# Patient Record
Sex: Female | Born: 1935
Health system: Southern US, Community
[De-identification: ages and names within clinical notes are randomized; demographics above are authoritative.]

## PROBLEM LIST (undated history)

## (undated) DIAGNOSIS — D699 Hemorrhagic condition, unspecified: Secondary | ICD-10-CM

## (undated) DIAGNOSIS — M1990 Unspecified osteoarthritis, unspecified site: Secondary | ICD-10-CM

## (undated) DIAGNOSIS — E7800 Pure hypercholesterolemia, unspecified: Secondary | ICD-10-CM

## (undated) DIAGNOSIS — R233 Spontaneous ecchymoses: Secondary | ICD-10-CM

## (undated) DIAGNOSIS — I10 Essential (primary) hypertension: Secondary | ICD-10-CM

## (undated) DIAGNOSIS — R238 Other skin changes: Secondary | ICD-10-CM

## (undated) DIAGNOSIS — Z87442 Personal history of urinary calculi: Secondary | ICD-10-CM

## (undated) DIAGNOSIS — C539 Malignant neoplasm of cervix uteri, unspecified: Secondary | ICD-10-CM

## (undated) HISTORY — PX: KNEE ARTHROSCOPY: SHX127

## (undated) HISTORY — PX: CHOLECYSTECTOMY: SHX55

## (undated) HISTORY — PX: APPENDECTOMY: SHX54

## (undated) HISTORY — PX: SHOULDER OPEN ROTATOR CUFF REPAIR: SHX2407

---

## 1963-11-25 DIAGNOSIS — C539 Malignant neoplasm of cervix uteri, unspecified: Secondary | ICD-10-CM

## 1963-11-25 HISTORY — PX: ABDOMINAL HYSTERECTOMY: SHX81

## 1963-11-25 HISTORY — DX: Malignant neoplasm of cervix uteri, unspecified: C53.9

## 2000-11-20 ENCOUNTER — Encounter: Payer: Self-pay | Admitting: General Surgery

## 2000-11-25 ENCOUNTER — Ambulatory Visit (HOSPITAL_COMMUNITY): Admission: RE | Admit: 2000-11-25 | Discharge: 2000-11-26 | Payer: Self-pay | Admitting: General Surgery

## 2001-07-29 ENCOUNTER — Ambulatory Visit (HOSPITAL_COMMUNITY): Admission: RE | Admit: 2001-07-29 | Discharge: 2001-07-29 | Payer: Self-pay | Admitting: Gastroenterology

## 2006-01-14 ENCOUNTER — Ambulatory Visit: Payer: Self-pay | Admitting: Unknown Physician Specialty

## 2006-05-20 ENCOUNTER — Ambulatory Visit: Payer: Self-pay | Admitting: Unknown Physician Specialty

## 2007-11-29 ENCOUNTER — Ambulatory Visit: Payer: Self-pay | Admitting: Unknown Physician Specialty

## 2008-01-19 LAB — HM DEXA SCAN: HM Dexa Scan: NORMAL

## 2008-06-13 ENCOUNTER — Encounter: Payer: Self-pay | Admitting: Orthopedic Surgery

## 2008-06-24 ENCOUNTER — Encounter: Payer: Self-pay | Admitting: Orthopedic Surgery

## 2009-02-27 ENCOUNTER — Encounter: Payer: Self-pay | Admitting: Orthopedic Surgery

## 2009-03-28 ENCOUNTER — Inpatient Hospital Stay: Payer: Self-pay | Admitting: Internal Medicine

## 2009-04-05 ENCOUNTER — Encounter: Payer: Self-pay | Admitting: Orthopedic Surgery

## 2009-04-24 ENCOUNTER — Encounter: Payer: Self-pay | Admitting: Orthopedic Surgery

## 2009-05-24 ENCOUNTER — Encounter: Payer: Self-pay | Admitting: Orthopedic Surgery

## 2009-06-24 ENCOUNTER — Encounter: Payer: Self-pay | Admitting: Orthopedic Surgery

## 2009-07-25 ENCOUNTER — Encounter: Payer: Self-pay | Admitting: Orthopedic Surgery

## 2009-08-24 ENCOUNTER — Encounter: Payer: Self-pay | Admitting: Orthopedic Surgery

## 2010-12-15 ENCOUNTER — Encounter: Payer: Self-pay | Admitting: Rheumatology

## 2010-12-30 LAB — HM COLONOSCOPY

## 2011-01-10 LAB — HM COLONOSCOPY

## 2011-01-19 DIAGNOSIS — A0471 Enterocolitis due to Clostridium difficile, recurrent: Secondary | ICD-10-CM | POA: Insufficient documentation

## 2011-01-28 ENCOUNTER — Other Ambulatory Visit (HOSPITAL_COMMUNITY): Payer: Self-pay | Admitting: Gastroenterology

## 2011-01-28 DIAGNOSIS — R053 Chronic cough: Secondary | ICD-10-CM

## 2011-01-28 DIAGNOSIS — R05 Cough: Secondary | ICD-10-CM

## 2011-01-28 DIAGNOSIS — K5792 Diverticulitis of intestine, part unspecified, without perforation or abscess without bleeding: Secondary | ICD-10-CM

## 2011-01-29 ENCOUNTER — Ambulatory Visit (HOSPITAL_COMMUNITY)
Admission: RE | Admit: 2011-01-29 | Discharge: 2011-01-29 | Disposition: A | Discharge status: Home / Self Care | Payer: Medicare Other | Source: Ambulatory Visit | Attending: Gastroenterology | Admitting: Gastroenterology

## 2011-01-29 ENCOUNTER — Encounter (HOSPITAL_COMMUNITY): Payer: Self-pay

## 2011-01-29 DIAGNOSIS — K5792 Diverticulitis of intestine, part unspecified, without perforation or abscess without bleeding: Secondary | ICD-10-CM

## 2011-01-29 DIAGNOSIS — R05 Cough: Secondary | ICD-10-CM

## 2011-01-29 DIAGNOSIS — R109 Unspecified abdominal pain: Secondary | ICD-10-CM | POA: Insufficient documentation

## 2011-01-29 DIAGNOSIS — K5730 Diverticulosis of large intestine without perforation or abscess without bleeding: Secondary | ICD-10-CM | POA: Insufficient documentation

## 2011-01-29 DIAGNOSIS — R053 Chronic cough: Secondary | ICD-10-CM

## 2011-01-29 DIAGNOSIS — R197 Diarrhea, unspecified: Secondary | ICD-10-CM | POA: Insufficient documentation

## 2011-01-29 HISTORY — DX: Essential (primary) hypertension: I10

## 2011-01-29 MED ORDER — IOHEXOL 300 MG/ML  SOLN
100.0000 mL | Freq: Once | INTRAMUSCULAR | Status: AC | PRN
  Administered 2011-01-29: 100 mL via INTRAVENOUS

## 2011-01-30 LAB — HM MAMMOGRAPHY: HM Mammogram: NORMAL

## 2011-02-09 ENCOUNTER — Emergency Department (HOSPITAL_COMMUNITY)
Admission: EM | Admit: 2011-02-09 | Discharge: 2011-02-10 | Disposition: A | Discharge status: Home / Self Care | Payer: Medicare Other | Attending: Emergency Medicine | Admitting: Emergency Medicine

## 2011-02-09 DIAGNOSIS — R197 Diarrhea, unspecified: Secondary | ICD-10-CM | POA: Insufficient documentation

## 2011-02-09 DIAGNOSIS — R112 Nausea with vomiting, unspecified: Secondary | ICD-10-CM | POA: Insufficient documentation

## 2011-02-09 DIAGNOSIS — R109 Unspecified abdominal pain: Secondary | ICD-10-CM | POA: Insufficient documentation

## 2011-02-09 DIAGNOSIS — E7800 Pure hypercholesterolemia, unspecified: Secondary | ICD-10-CM | POA: Insufficient documentation

## 2011-02-09 LAB — CBC
HCT: 43.1 % (ref 36.0–46.0)
Hemoglobin: 14.6 g/dL (ref 12.0–15.0)
MCH: 31.1 pg (ref 26.0–34.0)
MCHC: 33.9 g/dL (ref 30.0–36.0)
MCV: 91.7 fL (ref 78.0–100.0)
Platelets: 196 10*3/uL (ref 150–400)
RBC: 4.7 MIL/uL (ref 3.87–5.11)
RDW: 13.8 % (ref 11.5–15.5)
WBC: 11.3 10*3/uL — ABNORMAL HIGH (ref 4.0–10.5)

## 2011-02-09 LAB — POCT CARDIAC MARKERS
CKMB, poc: <1 ng/mL — ABNORMAL LOW (ref 1.0–8.0)
Myoglobin, poc: 125 ng/mL (ref 12–200)
Troponin i, poc: <0.05 ng/mL (ref 0.00–0.09)

## 2011-02-09 LAB — DIFFERENTIAL
Basophils Absolute: 0 10*3/uL (ref 0.0–0.1)
Basophils Relative: 0 % (ref 0–1)
Eosinophils Absolute: 0 10*3/uL (ref 0.0–0.7)
Eosinophils Relative: 0 % (ref 0–5)
Lymphocytes Relative: 8 % — ABNORMAL LOW (ref 12–46)
Lymphs Abs: 0.9 10*3/uL (ref 0.7–4.0)
Monocytes Absolute: 0.6 10*3/uL (ref 0.1–1.0)
Monocytes Relative: 5 % (ref 3–12)
Neutro Abs: 9.8 10*3/uL — ABNORMAL HIGH (ref 1.7–7.7)
Neutrophils Relative %: 87 % — ABNORMAL HIGH (ref 43–77)

## 2011-02-09 LAB — COMPREHENSIVE METABOLIC PANEL
ALT: 24 U/L (ref 0–35)
AST: 24 U/L (ref 0–37)
Albumin: 3.2 g/dL — ABNORMAL LOW (ref 3.5–5.2)
Alkaline Phosphatase: 47 U/L (ref 39–117)
BUN: 11 mg/dL (ref 6–23)
CO2: 24 mEq/L (ref 19–32)
Calcium: 8.6 mg/dL (ref 8.4–10.5)
Chloride: 101 mEq/L (ref 96–112)
Creatinine, Ser: 0.93 mg/dL (ref 0.4–1.2)
GFR calc Af Amer: >60 mL/min (ref >60)
GFR calc non Af Amer: 58 mL/min — ABNORMAL LOW (ref >60)
Glucose, Bld: 124 mg/dL — ABNORMAL HIGH (ref 70–99)
Potassium: 3.2 mEq/L — ABNORMAL LOW (ref 3.5–5.1)
Sodium: 133 mEq/L — ABNORMAL LOW (ref 135–145)
Total Bilirubin: 0.6 mg/dL (ref 0.3–1.2)
Total Protein: 6.5 g/dL (ref 6.0–8.3)

## 2011-02-09 LAB — LIPASE, BLOOD: Lipase: 18 U/L (ref 11–59)

## 2011-02-09 LAB — URINALYSIS, ROUTINE W REFLEX MICROSCOPIC
Bilirubin Urine: NEGATIVE
Glucose, UA: NEGATIVE mg/dL
Leukocytes, UA: NEGATIVE
Nitrite: NEGATIVE
Protein, ur: 30 mg/dL — AB
Specific Gravity, Urine: 1.024 (ref 1.005–1.030)
Urobilinogen, UA: 0.2 mg/dL (ref 0.0–1.0)
pH: 5 (ref 5.0–8.0)

## 2011-02-09 LAB — URINE MICROSCOPIC-ADD ON

## 2011-02-09 LAB — LACTIC ACID, PLASMA: Lactic Acid, Venous: 1.8 mmol/L (ref 0.5–2.2)

## 2011-02-10 LAB — OCCULT BLOOD, POC DEVICE: Fecal Occult Bld: POSITIVE

## 2011-02-11 LAB — URINE CULTURE
Colony Count: NO GROWTH
Culture  Setup Time: 201203190951
Culture: NO GROWTH

## 2011-02-13 ENCOUNTER — Emergency Department (HOSPITAL_COMMUNITY)
Admission: EM | Admit: 2011-02-13 | Discharge: 2011-02-13 | Disposition: A | Discharge status: Home / Self Care | Payer: Medicare Other | Attending: Emergency Medicine | Admitting: Emergency Medicine

## 2011-02-13 DIAGNOSIS — R109 Unspecified abdominal pain: Secondary | ICD-10-CM | POA: Insufficient documentation

## 2011-02-13 DIAGNOSIS — Z8541 Personal history of malignant neoplasm of cervix uteri: Secondary | ICD-10-CM | POA: Insufficient documentation

## 2011-02-13 DIAGNOSIS — R634 Abnormal weight loss: Secondary | ICD-10-CM | POA: Insufficient documentation

## 2011-02-13 DIAGNOSIS — R197 Diarrhea, unspecified: Secondary | ICD-10-CM | POA: Insufficient documentation

## 2011-02-13 DIAGNOSIS — Z79899 Other long term (current) drug therapy: Secondary | ICD-10-CM | POA: Insufficient documentation

## 2011-02-13 DIAGNOSIS — E876 Hypokalemia: Secondary | ICD-10-CM | POA: Insufficient documentation

## 2011-02-13 DIAGNOSIS — R5381 Other malaise: Secondary | ICD-10-CM | POA: Insufficient documentation

## 2011-02-13 DIAGNOSIS — R5383 Other fatigue: Secondary | ICD-10-CM | POA: Insufficient documentation

## 2011-02-13 DIAGNOSIS — G8929 Other chronic pain: Secondary | ICD-10-CM | POA: Insufficient documentation

## 2011-02-13 LAB — URINALYSIS, ROUTINE W REFLEX MICROSCOPIC
Bilirubin Urine: NEGATIVE
Glucose, UA: NEGATIVE mg/dL
Hgb urine dipstick: NEGATIVE
Ketones, ur: NEGATIVE mg/dL
Nitrite: NEGATIVE
Protein, ur: NEGATIVE mg/dL
Specific Gravity, Urine: 1.01 (ref 1.005–1.030)
Urobilinogen, UA: 0.2 mg/dL (ref 0.0–1.0)
pH: 6 (ref 5.0–8.0)

## 2011-02-13 LAB — COMPREHENSIVE METABOLIC PANEL
ALT: 18 U/L (ref 0–35)
AST: 20 U/L (ref 0–37)
Albumin: 3.3 g/dL — ABNORMAL LOW (ref 3.5–5.2)
Alkaline Phosphatase: 43 U/L (ref 39–117)
BUN: 4 mg/dL — ABNORMAL LOW (ref 6–23)
CO2: 27 mEq/L (ref 19–32)
Calcium: 8.6 mg/dL (ref 8.4–10.5)
Chloride: 105 mEq/L (ref 96–112)
Creatinine, Ser: 0.83 mg/dL (ref 0.4–1.2)
GFR calc Af Amer: >60 mL/min (ref >60)
GFR calc non Af Amer: >60 mL/min (ref >60)
Glucose, Bld: 104 mg/dL — ABNORMAL HIGH (ref 70–99)
Potassium: 3.1 mEq/L — ABNORMAL LOW (ref 3.5–5.1)
Sodium: 138 mEq/L (ref 135–145)
Total Bilirubin: 0.4 mg/dL (ref 0.3–1.2)
Total Protein: 6.1 g/dL (ref 6.0–8.3)

## 2011-02-13 LAB — URINE MICROSCOPIC-ADD ON

## 2011-02-13 LAB — DIFFERENTIAL
Basophils Absolute: 0.1 10*3/uL (ref 0.0–0.1)
Basophils Relative: 2 % — ABNORMAL HIGH (ref 0–1)
Eosinophils Absolute: 0.1 10*3/uL (ref 0.0–0.7)
Eosinophils Relative: 2 % (ref 0–5)
Lymphocytes Relative: 57 % — ABNORMAL HIGH (ref 12–46)
Lymphs Abs: 3.3 10*3/uL (ref 0.7–4.0)
Monocytes Absolute: 0.7 10*3/uL (ref 0.1–1.0)
Monocytes Relative: 11 % (ref 3–12)
Neutro Abs: 1.7 10*3/uL (ref 1.7–7.7)
Neutrophils Relative %: 28 % — ABNORMAL LOW (ref 43–77)

## 2011-02-13 LAB — CBC
HCT: 38.8 % (ref 36.0–46.0)
Hemoglobin: 13.5 g/dL (ref 12.0–15.0)
MCH: 31 pg (ref 26.0–34.0)
MCHC: 34.8 g/dL (ref 30.0–36.0)
MCV: 89 fL (ref 78.0–100.0)
Platelets: 233 10*3/uL (ref 150–400)
RBC: 4.36 MIL/uL (ref 3.87–5.11)
RDW: 13.9 % (ref 11.5–15.5)
WBC: 5.9 10*3/uL (ref 4.0–10.5)

## 2011-02-13 LAB — LIPASE, BLOOD: Lipase: 23 U/L (ref 11–59)

## 2011-07-10 ENCOUNTER — Encounter: Payer: Self-pay | Admitting: Orthopedic Surgery

## 2011-07-26 ENCOUNTER — Encounter: Payer: Self-pay | Admitting: Orthopedic Surgery

## 2011-08-25 ENCOUNTER — Encounter: Payer: Self-pay | Admitting: Orthopedic Surgery

## 2011-11-25 DIAGNOSIS — M2560 Stiffness of unspecified joint, not elsewhere classified: Secondary | ICD-10-CM | POA: Diagnosis not present

## 2011-11-25 DIAGNOSIS — M549 Dorsalgia, unspecified: Secondary | ICD-10-CM | POA: Diagnosis not present

## 2011-11-25 DIAGNOSIS — IMO0001 Reserved for inherently not codable concepts without codable children: Secondary | ICD-10-CM | POA: Diagnosis not present

## 2011-11-25 DIAGNOSIS — M6281 Muscle weakness (generalized): Secondary | ICD-10-CM | POA: Diagnosis not present

## 2011-11-27 DIAGNOSIS — IMO0001 Reserved for inherently not codable concepts without codable children: Secondary | ICD-10-CM | POA: Diagnosis not present

## 2011-11-27 DIAGNOSIS — M6281 Muscle weakness (generalized): Secondary | ICD-10-CM | POA: Diagnosis not present

## 2011-11-27 DIAGNOSIS — M2560 Stiffness of unspecified joint, not elsewhere classified: Secondary | ICD-10-CM | POA: Diagnosis not present

## 2011-11-27 DIAGNOSIS — M549 Dorsalgia, unspecified: Secondary | ICD-10-CM | POA: Diagnosis not present

## 2011-12-05 DIAGNOSIS — M6281 Muscle weakness (generalized): Secondary | ICD-10-CM | POA: Diagnosis not present

## 2011-12-05 DIAGNOSIS — IMO0001 Reserved for inherently not codable concepts without codable children: Secondary | ICD-10-CM | POA: Diagnosis not present

## 2011-12-05 DIAGNOSIS — M2560 Stiffness of unspecified joint, not elsewhere classified: Secondary | ICD-10-CM | POA: Diagnosis not present

## 2011-12-05 DIAGNOSIS — M549 Dorsalgia, unspecified: Secondary | ICD-10-CM | POA: Diagnosis not present

## 2011-12-17 DIAGNOSIS — E8941 Symptomatic postprocedural ovarian failure: Secondary | ICD-10-CM | POA: Diagnosis not present

## 2012-01-30 ENCOUNTER — Encounter: Payer: Self-pay | Admitting: Internal Medicine

## 2012-01-30 ENCOUNTER — Ambulatory Visit (INDEPENDENT_AMBULATORY_CARE_PROVIDER_SITE_OTHER): Payer: Medicare Other | Admitting: Internal Medicine

## 2012-01-30 VITALS — BP 116/64 | HR 78 | Temp 97.8°F | Resp 14 | Ht 62.0 in | Wt 128.8 lb

## 2012-01-30 DIAGNOSIS — Z1239 Encounter for other screening for malignant neoplasm of breast: Secondary | ICD-10-CM

## 2012-01-30 DIAGNOSIS — M545 Low back pain, unspecified: Secondary | ICD-10-CM | POA: Diagnosis not present

## 2012-01-30 DIAGNOSIS — E785 Hyperlipidemia, unspecified: Secondary | ICD-10-CM | POA: Diagnosis not present

## 2012-01-30 DIAGNOSIS — M5430 Sciatica, unspecified side: Secondary | ICD-10-CM

## 2012-01-30 DIAGNOSIS — D126 Benign neoplasm of colon, unspecified: Secondary | ICD-10-CM | POA: Diagnosis not present

## 2012-01-30 DIAGNOSIS — K635 Polyp of colon: Secondary | ICD-10-CM | POA: Insufficient documentation

## 2012-01-30 DIAGNOSIS — Z87898 Personal history of other specified conditions: Secondary | ICD-10-CM | POA: Insufficient documentation

## 2012-01-30 DIAGNOSIS — M5431 Sciatica, right side: Secondary | ICD-10-CM | POA: Insufficient documentation

## 2012-01-30 NOTE — Patient Instructions (Signed)
Return in April for a lab draws for fasting lipids and liver function tests

## 2012-01-30 NOTE — Progress Notes (Signed)
Subjective:    Patient ID: Emily Wu, female    DOB: September 04, 1931, 78 y.o.   MRN: [id number]  HPI Emily Wu is a 78 yr old white female who is here to establish primary care as a former patient of Emily Wu  She was previously Emily Wu's patient for a year or two.  She has no major complaints today but does not some persistent daily anxiety that   "I am very happy but I tend to be hyperactive".  She denies any history of trauma or physical abuse  that might predsipoe her to panic attacks and denies any features of panic disorder.  No prior trial of SSRI therapy.  Past Medical History  Diagnosis Date  . Hypertension   . S/P appendectomy   . S/P cholecystectomy   . S/P hysterectomy     Current Outpatient Prescriptions  Medication Sig Dispense Refill  . Ascorbic Acid (VITAMIN C) 100 MG tablet Take 100 mg by mouth daily.      Marland Kitchen atorvastatin (LIPITOR) 20 MG tablet Take 20 mg by mouth daily.      . calcium carbonate (OS-CAL) 600 MG TABS Take 600 mg by mouth 2 (two) times daily with a meal.      . estrogens, conjugated, (PREMARIN) 0.3 MG tablet Take 0.3 mg by mouth daily. Take daily for 21 days then do not take for 7 days.      . Multiple Vitamin (MULTIVITAMIN) tablet Take 1 tablet by mouth daily.      . Probiotic Product (PROBIOTIC FORMULA PO) Take by mouth.        Review of Systems  Constitutional: Negative for fever, chills and unexpected weight change.  HENT: Negative for hearing loss, ear pain, nosebleeds, congestion, sore throat, facial swelling, rhinorrhea, sneezing, mouth sores, trouble swallowing, neck pain, neck stiffness, voice change, postnasal drip, sinus pressure, tinnitus and ear discharge.   Eyes: Negative for pain, discharge, redness and visual disturbance.  Respiratory: Negative for cough, chest tightness, shortness of breath, wheezing and stridor.   Cardiovascular: Negative for chest pain, palpitations and leg swelling.  Musculoskeletal: Negative for myalgias  and arthralgias.  Skin: Negative for color change and rash.  Neurological: Negative for dizziness, weakness, light-headedness and headaches.  Hematological: Negative for adenopathy.       Objective:   Physical Exam  Constitutional: She is oriented to person, place, and time. She appears well-developed and well-nourished.  HENT:  Mouth/Throat: Oropharynx is clear and moist.  Eyes: EOM are normal. Pupils are equal, round, and reactive to light. No scleral icterus.  Neck: Normal range of motion. Neck supple. No JVD present. No thyromegaly present.  Cardiovascular: Normal rate, regular rhythm, normal heart sounds and intact distal pulses.   Pulmonary/Chest: Effort normal and breath sounds normal.  Abdominal: Soft. Bowel sounds are normal. She exhibits no mass. There is no tenderness.  Musculoskeletal: Normal range of motion. She exhibits no edema.  Lymphadenopathy:    She has no cervical adenopathy.  Neurological: She is alert and oriented to person, place, and time.  Skin: Skin is warm and dry.  Psychiatric: Her speech is normal. Judgment and thought content normal. Her mood appears anxious. She is is hyperactive. Cognition and memory are normal.       Very fidgety,  Repeatedly adjusts the waistband and zipper on her jacket       Assessment & Plan:   Sciatica of right side Occurred May 2012,  Symptoms resolved after PT, but she has occasional flares.  Recommended daily exercise to build core muscle strength with examples given     Colon polyps Managed with serial colonoscopy by Emily Wu in [place].  She is up to date.   Back pain, lumbosacral Chronic, aggravated by prolonged sitting and walking . Does daily exercises,  Monthly massage. Prior PT last May helped ,  Saw a neurosurgeon at [facility] who suggested repeating PT.  Avoiding NSAIDs due to history of gastric ulcer caused by Vioxx.     Updated Medication List Outpatient Encounter Prescriptions as of 01/30/2012  Medication Sig Dispense  Refill  . Ascorbic Acid (VITAMIN C) 100 MG tablet Take 100 mg by mouth daily.      Marland Kitchen atorvastatin (LIPITOR) 20 MG tablet Take 20 mg by mouth daily.      . calcium carbonate (OS-CAL) 600 MG TABS Take 600 mg by mouth 2 (two) times daily with a meal.      . estrogens, conjugated, (PREMARIN) 0.3 MG tablet Take 0.3 mg by mouth daily. Take daily for 21 days then do not take for 7 days.      . Multiple Vitamin (MULTIVITAMIN) tablet Take 1 tablet by mouth daily.      . Probiotic Product (PROBIOTIC FORMULA PO) Take by mouth.

## 2012-02-01 ENCOUNTER — Encounter: Payer: Self-pay | Admitting: Internal Medicine

## 2012-02-01 DIAGNOSIS — E785 Hyperlipidemia, unspecified: Secondary | ICD-10-CM | POA: Insufficient documentation

## 2012-02-01 NOTE — Assessment & Plan Note (Signed)
Occurred May 2012,  Symptoms resolved after PT, but she has occasional flares.  Recommended daily exercise to build core muscle strength with examples given

## 2012-02-01 NOTE — Assessment & Plan Note (Signed)
Chronic, aggravated by prolonged sitting and walking . Does daily exercises,  Monthly massage. Prior PT last May helped ,  Saw a neurosurgeon at [facility] who suggested repeating PT.  Avoiding NSAIDs due to history of gastric ulcer caused by Vioxx.

## 2012-02-01 NOTE — Assessment & Plan Note (Signed)
Managed with serial colonoscopy by [facility] in [place].  She is up to date.

## 2012-02-01 NOTE — Assessment & Plan Note (Signed)
Managed with statin therapy which she has tolerated without incident.  Repeat CMET and lipids due in April.

## 2012-03-09 ENCOUNTER — Other Ambulatory Visit (INDEPENDENT_AMBULATORY_CARE_PROVIDER_SITE_OTHER): Payer: Medicare Other | Admitting: *Deleted

## 2012-03-09 DIAGNOSIS — E785 Hyperlipidemia, unspecified: Secondary | ICD-10-CM

## 2012-03-09 LAB — LIPID PANEL
Cholesterol: 163 mg/dL (ref 0–200)
HDL: 42.7 mg/dL (ref >39.00)
LDL Cholesterol: 81 mg/dL (ref 0–99)
Total CHOL/HDL Ratio: 4
Triglycerides: 197 mg/dL — ABNORMAL HIGH (ref 0.0–149.0)
VLDL: 39.4 mg/dL (ref 0.0–40.0)

## 2012-03-10 LAB — COMPLETE METABOLIC PANEL WITH GFR
ALT: 17 U/L (ref 0–35)
AST: 23 U/L (ref 0–37)
Albumin: 3.8 g/dL (ref 3.5–5.2)
Alkaline Phosphatase: 49 U/L (ref 39–117)
BUN: 15 mg/dL (ref 6–23)
CO2: 30 mEq/L (ref 19–32)
Calcium: 9.3 mg/dL (ref 8.4–10.5)
Chloride: 107 mEq/L (ref 96–112)
Creat: 0.92 mg/dL (ref 0.50–1.10)
GFR, Est African American: 69 mL/min
GFR, Est Non African American: 60 mL/min
Glucose, Bld: 80 mg/dL (ref 70–99)
Potassium: 4.3 mEq/L (ref 3.5–5.3)
Sodium: 141 mEq/L (ref 135–145)
Total Bilirubin: 0.3 mg/dL (ref 0.3–1.2)
Total Protein: 6 g/dL (ref 6.0–8.3)

## 2012-03-16 DIAGNOSIS — Z978 Presence of other specified devices: Secondary | ICD-10-CM | POA: Diagnosis not present

## 2012-03-16 DIAGNOSIS — Z9189 Other specified personal risk factors, not elsewhere classified: Secondary | ICD-10-CM | POA: Diagnosis not present

## 2012-03-23 DIAGNOSIS — H269 Unspecified cataract: Secondary | ICD-10-CM | POA: Diagnosis not present

## 2012-04-12 ENCOUNTER — Ambulatory Visit (INDEPENDENT_AMBULATORY_CARE_PROVIDER_SITE_OTHER): Payer: Medicare Other | Admitting: Internal Medicine

## 2012-04-12 ENCOUNTER — Encounter: Payer: Self-pay | Admitting: Internal Medicine

## 2012-04-12 VITALS — BP 144/74 | HR 77 | Temp 98.3°F | Resp 16 | Ht 62.0 in | Wt 128.0 lb

## 2012-04-12 DIAGNOSIS — J029 Acute pharyngitis, unspecified: Secondary | ICD-10-CM

## 2012-04-12 DIAGNOSIS — J069 Acute upper respiratory infection, unspecified: Secondary | ICD-10-CM | POA: Insufficient documentation

## 2012-04-12 NOTE — Assessment & Plan Note (Signed)
Persistent symptoms with normal exam suggest reflux ans cause.  3 week trial of dexilant 60 mg and salt water gargles.

## 2012-04-12 NOTE — Assessment & Plan Note (Signed)
.  This is most likely viral given the symptoms she has prescribed. .  Discussed with patient that an antibiotic will not help the symptoms and will increase  his risk of developing diarrhea. . Benadryl or zyrtec for PND, oral decongestant, Delsym for cough, Gargle with salt water.

## 2012-04-12 NOTE — Progress Notes (Signed)
Patient ID: Emily Wu, female   DOB: December 06, 1930, 78 y.o.   MRN: [id number] Patient Active Problem List  Diagnoses  . H/O diarrhea  . Colon polyps  . Sciatica of right side  . Back pain, lumbosacral  . Hyperlipidemia LDL goal < 100  . Pharyngitis  . Acute URI of multiple sites    Subjective:  CC:   Chief Complaint  Patient presents with  . Nasal Congestion  . Sore Throat  . Cough    productive cough with yellow mucus    HPI:   HANNAHGRACE Wu a 78 y.o. female who presents with epigastric burning since Tuesday accompanied by sinus drainage, sore throat and cough productive of yellow sputum.  She has been taking chloraseptic and nyquil.  Her Cough has improved but is  not resolved.      Past Medical History  Diagnosis Date  . Hypertension   . S/P appendectomy   . S/P cholecystectomy   . S/P hysterectomy     Past Surgical History  Procedure Date  . Left knee     arthroscopy ,  [facility]  . Rotator cuff repair 2007,  2010    bilateral,  [facility]         The following portions of the patient's history were reviewed and updated as appropriate: Allergies, current medications, and problem list.    Review of Systems:   12 Pt  review of systems was negative except those addressed in the HPI,     History   Social History  . Marital Status: Married    Spouse Name: N/A    Number of Children: N/A  . Years of Education: N/A   Occupational History  . Not on file.   Social History Main Topics  . Smoking status: Former Games developer  . Smokeless tobacco: Never Used  . Alcohol Use: No  . Drug Use: No  . Sexually Active: Not on file   Other Topics Concern  . Not on file   Social History Narrative  . No narrative on file    Objective:  BP 144/74  Pulse 77  Temp(Src) 98.3 F (36.8 C) (Oral)  Resp 16  Ht 5\' 2"  (1.575 m)  Wt 128 lb (58.06 kg)  BMI 23.41 kg/m2  SpO2 99%  General appearance: alert, cooperative and appears stated age Ears: normal TM's  and external ear canals both ears Throat: lips, mucosa, and tongue normal; teeth and gums normal Neck: no adenopathy, no carotid bruit, supple, symmetrical, trachea midline and thyroid not enlarged, symmetric, no tenderness/mass/nodules Back: symmetric, no curvature. ROM normal. No CVA tenderness. Lungs: clear to auscultation bilaterally Heart: regular rate and rhythm, S1, S2 normal, no murmur, click, rub or gallop Abdomen: soft, non-tender; bowel sounds normal; no masses,  no organomegaly Pulses: 2+ and symmetric Skin: Skin color, texture, turgor normal. No rashes or lesions Lymph nodes: Cervical, supraclavicular, and axillary nodes normal.  Assessment and Plan:  Pharyngitis Persistent symptoms with normal exam suggest reflux ans cause.  3 week trial of dexilant 60 mg and salt water gargles.   Acute URI of multiple sites .This is most likely viral given the symptoms she has prescribed. .  Discussed with patient that an antibiotic will not help the symptoms and will increase  his risk of developing diarrhea. . Benadryl or zyrtec for PND, oral decongestant, Delsym for cough, Gargle with salt water.     Updated Medication List Outpatient Encounter Prescriptions as of 04/12/2012  Medication Sig Dispense Refill  .  Ascorbic Acid (VITAMIN C) 100 MG tablet Take 100 mg by mouth daily.      Marland Kitchen atorvastatin (LIPITOR) 20 MG tablet Take 20 mg by mouth daily.      . calcium carbonate (OS-CAL) 600 MG TABS Take 600 mg by mouth 2 (two) times daily with a meal.      . estrogens, conjugated, (PREMARIN) 0.3 MG tablet Take 0.3 mg by mouth daily. Take daily for 21 days then do not take for 7 days.      . Multiple Vitamin (MULTIVITAMIN) tablet Take 1 tablet by mouth daily.      . Probiotic Product (PROBIOTIC FORMULA PO) Take by mouth.         No orders of the defined types were placed in this encounter.    No Follow-up on file.

## 2012-04-12 NOTE — Patient Instructions (Signed)
I believe you have a viral  Syndrome .  The post nasal drip is causing your sore throat.   Lavage your sinuses twice daly with Simply saline nasal spray along with either benadryl 25 mg every 8 hours  Or your daily zyrtec for the post nasal drip  Gargle with salt water often for the sore throat.  Use Delsym OTC for the cough.   I am also giving you samples of a medicine for reflux,  Take it daily .  If the throat is no better  In 3 to 4 days OR  if you develop T > 100.4,  Green nasal discharge,  Or facial pain,  Call me back and I will send an antibiotic.

## 2012-07-01 ENCOUNTER — Telehealth: Payer: Self-pay | Admitting: Internal Medicine

## 2012-07-01 NOTE — Telephone Encounter (Signed)
Patient calling, woke up with a sore throat on Wednesday.  Same has continued and now has a cough with yellow mucous.  Afebrile.   Has a headache also.   Triaged per Cough.  Needs to be seen in 24 hours.  Scheduled for Friday at 415p.

## 2012-07-02 ENCOUNTER — Encounter: Payer: Self-pay | Admitting: Internal Medicine

## 2012-07-02 ENCOUNTER — Ambulatory Visit (INDEPENDENT_AMBULATORY_CARE_PROVIDER_SITE_OTHER): Payer: Medicare Other | Admitting: Internal Medicine

## 2012-07-02 VITALS — BP 120/64 | HR 76 | Temp 97.7°F | Resp 16 | Wt 129.5 lb

## 2012-07-02 DIAGNOSIS — J069 Acute upper respiratory infection, unspecified: Secondary | ICD-10-CM | POA: Diagnosis not present

## 2012-07-02 MED ORDER — BENZONATATE 100 MG PO CAPS: 100.0000 mg | ORAL_CAPSULE | Freq: Three times a day (TID) | ORAL | Status: DC | PRN

## 2012-07-02 MED ORDER — AZITHROMYCIN 250 MG PO TABS: ORAL_TABLET | ORAL | Status: AC

## 2012-07-02 NOTE — Progress Notes (Signed)
Patient ID: Emily Wu, female   DOB: 1931/06/29, 78 y.o.   MRN: [id number] Subjective:     Emily Wu is a 78 y.o. female who presents for evaluation of symptoms of a URI. Symptoms include congestion, cough described as nonproductive, headache described as mild and post nasal drip. Onset of symptoms was 2 days ago, and has been unchanged since that time. Treatment to date: none.  The following portions of the patient's history were reviewed and updated as appropriate: allergies, current medications, past family history, past medical history, past social history, past surgical history and problem list.  Review of Systems Ears, nose, mouth, throat, and face: positive for nasal congestion and sore throat   Objective:    General appearance: alert, cooperative and appears stated age Eyes: conjunctivae/corneas clear. PERRL, EOM's intact. Fundi benign. Ears: normal TM's and external ear canals both ears Nose: Nares normal. Septum midline. Mucosa normal. No drainage or sinus tenderness. Throat: lips, mucosa, and tongue normal; teeth and gums normal Lungs: clear to auscultation bilaterally   Assessment:    viral upper respiratory illness   Plan:    Discussed diagnosis and treatment of URI. Suggested symptomatic OTC remedies. Nasal saline spray for congestion. Follow up as needed.

## 2012-07-02 NOTE — Patient Instructions (Addendum)
You have a viral  Syndrome .  The post nasal drip is causing your sore throat.  Lavage your sinuses twice daly with Simply saline nasal spray.  Use benadryl 25 mg every 8 hours and Sudafed PE 10 to 30 every 8 hours to manage the drainage and congestion.  Gargle with salt water often for the sore throat.  If OTC Delsym does not manage the cough, you can use the cough capsules I have prescribed.  If the throat is no better  In 3 to 4 days OR  if you develop T > 100.4,  Green nasal discharge,  Or facial pain,  Start the z pack.

## 2012-07-06 ENCOUNTER — Encounter: Payer: Self-pay | Admitting: Internal Medicine

## 2012-07-06 ENCOUNTER — Ambulatory Visit (INDEPENDENT_AMBULATORY_CARE_PROVIDER_SITE_OTHER): Payer: Medicare Other | Admitting: Internal Medicine

## 2012-07-06 ENCOUNTER — Ambulatory Visit (INDEPENDENT_AMBULATORY_CARE_PROVIDER_SITE_OTHER)
Admission: RE | Admit: 2012-07-06 | Discharge: 2012-07-06 | Disposition: A | Discharge status: Home / Self Care | Payer: Medicare Other | Source: Ambulatory Visit | Attending: Internal Medicine | Admitting: Internal Medicine

## 2012-07-06 VITALS — BP 126/62 | HR 80 | Temp 97.9°F | Resp 16 | Wt 128.8 lb

## 2012-07-06 DIAGNOSIS — M1712 Unilateral primary osteoarthritis, left knee: Secondary | ICD-10-CM | POA: Insufficient documentation

## 2012-07-06 DIAGNOSIS — N951 Menopausal and female climacteric states: Secondary | ICD-10-CM

## 2012-07-06 DIAGNOSIS — M1990 Unspecified osteoarthritis, unspecified site: Secondary | ICD-10-CM | POA: Diagnosis not present

## 2012-07-06 DIAGNOSIS — J069 Acute upper respiratory infection, unspecified: Secondary | ICD-10-CM

## 2012-07-06 DIAGNOSIS — Z8719 Personal history of other diseases of the digestive system: Secondary | ICD-10-CM

## 2012-07-06 DIAGNOSIS — R232 Flushing: Secondary | ICD-10-CM | POA: Diagnosis not present

## 2012-07-06 DIAGNOSIS — Z87898 Personal history of other specified conditions: Secondary | ICD-10-CM

## 2012-07-06 DIAGNOSIS — E785 Hyperlipidemia, unspecified: Secondary | ICD-10-CM

## 2012-07-06 NOTE — Patient Instructions (Addendum)
We are ruling out other caises of hot flashes with a urine study and a chest x ray  If they are negative, we can try an alterrative hormone like the patch

## 2012-07-06 NOTE — Progress Notes (Signed)
Patient ID: Emily Wu, female   DOB: 1930/12/04, 78 y.o.   MRN: [id number]  Patient Active Problem List  Diagnosis  . H/O diarrhea  . Colon polyps  . Sciatica of right side  . Back pain, lumbosacral  . Hyperlipidemia LDL goal < 100  . Pharyngitis  . Acute URI of multiple sites  . Upper respiratory infection  . Osteoarthritis  . Hot flashes    Subjective:  CC:   Chief Complaint  Patient presents with  . Follow-up    4 month    HPI:   Emily Wu a 78 y.o. female who presents 4 month follow up on chronic conditions. She was treated last week for an upper respiratory infection and started azithromycin over the weekend due to persistent symptoms .  she states that she feels lousy and spent the weekend in bed. In the weekend because her to have some aggravation of her arthritis and low back pain. She has no radiating symptoms. She denies any fevers. She has had a recurrence of hot flashes over the last several months despite use of estradiol. The flashes occur both day and night and she averages 3-4 day. They are completely drenching. She is not aware of any correlation with eating. She has been having diarrhea again which she is attributed to the antibiotics. She denies tremors headaches and nausea..    Past Medical History  Diagnosis Date  . Hypertension   . S/P appendectomy   . S/P cholecystectomy   . S/P hysterectomy     Past Surgical History  Procedure Date  . Left knee     arthroscopy ,  [facility]  . Rotator cuff repair 2007,  2010    bilateral,  [facility]         The following portions of the patient's history were reviewed and updated as appropriate: Allergies, current medications, and problem list.    Review of Systems:   A comprehensive ROS was done and positive for hot flashes and diarrhea.   The rest was negative.   History   Social History  . Marital Status: Married    Spouse Name: N/A    Number of Children: N/A  . Years of Education: N/A    Occupational History  . Not on file.   Social History Main Topics  . Smoking status: Former Games developer  . Smokeless tobacco: Never Used  . Alcohol Use: No  . Drug Use: No  . Sexually Active: Not on file   Other Topics Concern  . Not on file   Social History Narrative  . No narrative on file    Objective:  BP 126/62  Pulse 80  Temp 97.9 F (36.6 C) (Oral)  Resp 16  Wt 128 lb 12 oz (58.401 kg)  SpO2 98%  General appearance: alert, cooperative and appears stated age Ears: normal TM's and external ear canals both ears Throat: lips, mucosa, and tongue normal; teeth and gums normal Neck: no adenopathy, no carotid bruit, supple, symmetrical, trachea midline and thyroid not enlarged, symmetric, no tenderness/mass/nodules Back: symmetric, no curvature. ROM normal. No CVA tenderness. Lungs: clear to auscultation bilaterally Heart: regular rate and rhythm, S1, S2 normal, no murmur, click, rub or gallop Abdomen: soft, non-tender; bowel sounds normal; no masses,  no organomegaly Pulses: 2+ and symmetric Skin: Skin color, texture, turgor normal. No rashes or lesions Lymph nodes: Cervical, supraclavicular, and axillary nodes normal.  Assessment and Plan:  Acute URI of multiple sites She is currently taking azithromycin  for persistent symptoms.  Hyperlipidemia LDL goal < 100 Managed with low dose Lipitor, LDL less than 100 by April labs. Triglycerides were slightly elevated at that time and we discussed low glycemic index diet and exercise as ways to lower them She will return for fasting lipids in October.   H/O diarrhea Given her history of diarrhea with recent recurrence accompanied by hot flashes I am ordering a 24-hour urine collection for 5 hydroxy tripped as a screening for carcinoid syndrome. If her diarrhea persists she will need to be screened for C. difficile  Hot flashes Ruling her out for carcinoid syndrome with a 24-hour urine collection. If this is normal we  discussed changing her hormone replacement to transdermal delivery system to see if that helps.   Updated Medication List Outpatient Encounter Prescriptions as of 07/06/2012  Medication Sig Dispense Refill  . Ascorbic Acid (VITAMIN C) 100 MG tablet Take 500 mg by mouth daily.       Marland Kitchen atorvastatin (LIPITOR) 20 MG tablet Take 20 mg by mouth daily.      Marland Kitchen azithromycin (ZITHROMAX) 250 MG tablet 2 tablets on Day 1, then one tablet daily  6 tablet  0  . calcium carbonate (OS-CAL) 600 MG TABS Take 600 mg by mouth daily.       Marland Kitchen co-enzyme Q-10 30 MG capsule Take 30 mg by mouth daily.      Marland Kitchen estrogens, conjugated, (PREMARIN) 0.3 MG tablet Take 0.3 mg by mouth daily. Take daily for 21 days then do not take for 7 days.      . Multiple Vitamin (MULTIVITAMIN) tablet Take 1 tablet by mouth daily.      . Probiotic Product (PROBIOTIC FORMULA PO) Take by mouth.      . Vitamin D, Ergocalciferol, (DRISDOL) 50000 UNITS CAPS Take 5,000 Units by mouth daily.      Marland Kitchen DISCONTD: benzonatate (TESSALON) 100 MG capsule Take 1 capsule (100 mg total) by mouth 3 (three) times daily as needed for cough.  30 capsule  0     Orders Placed This Encounter  Procedures  . DG Chest 2 View  . 5 HIAA, quantitative, Urine, 24 hour    Return in about 6 months (around 01/06/2013).

## 2012-07-07 ENCOUNTER — Encounter: Payer: Self-pay | Admitting: Internal Medicine

## 2012-07-07 DIAGNOSIS — R232 Flushing: Secondary | ICD-10-CM | POA: Insufficient documentation

## 2012-07-07 NOTE — Assessment & Plan Note (Signed)
Given her history of diarrhea with recent recurrence accompanied by hot flashes I am ordering a 24-hour urine collection for 5 hydroxy tripped as a screening for carcinoid syndrome. If her diarrhea persists she will need to be screened for C. difficile

## 2012-07-07 NOTE — Assessment & Plan Note (Signed)
Managed with low dose Lipitor, LDL less than 100 by April labs. Triglycerides were slightly elevated at that time and we discussed low glycemic index diet and exercise as ways to lower them She will return for fasting lipids in October.

## 2012-07-07 NOTE — Assessment & Plan Note (Signed)
Ruling her out for carcinoid syndrome with a 24-hour urine collection. If this is normal we discussed changing her hormone replacement to transdermal delivery system to see if that helps.

## 2012-07-07 NOTE — Assessment & Plan Note (Signed)
She is currently taking azithromycin for persistent symptoms.

## 2012-07-08 DIAGNOSIS — R232 Flushing: Secondary | ICD-10-CM | POA: Diagnosis not present

## 2012-07-12 LAB — 5 HIAA, QUANTITATIVE, URINE, 24 HOUR: 5-HIAA, 24 Hr Urine: 2.4 mg/24 h (ref <=6.0)

## 2012-08-23 ENCOUNTER — Ambulatory Visit (INDEPENDENT_AMBULATORY_CARE_PROVIDER_SITE_OTHER): Payer: Medicare Other | Admitting: Internal Medicine

## 2012-08-23 ENCOUNTER — Encounter: Payer: Self-pay | Admitting: Internal Medicine

## 2012-08-23 VITALS — BP 144/74 | HR 70 | Temp 98.2°F | Resp 12 | Ht 63.0 in | Wt 131.2 lb

## 2012-08-23 DIAGNOSIS — M549 Dorsalgia, unspecified: Secondary | ICD-10-CM | POA: Diagnosis not present

## 2012-08-23 DIAGNOSIS — M25559 Pain in unspecified hip: Secondary | ICD-10-CM

## 2012-08-23 DIAGNOSIS — M159 Polyosteoarthritis, unspecified: Secondary | ICD-10-CM | POA: Diagnosis not present

## 2012-08-23 DIAGNOSIS — M25552 Pain in left hip: Secondary | ICD-10-CM

## 2012-08-23 MED ORDER — PREDNISONE (PAK) 10 MG PO TABS: ORAL_TABLET | ORAL | Status: DC

## 2012-08-23 MED ORDER — METHYLPREDNISOLONE ACETATE 40 MG/ML IJ SUSP
40.0000 mg | Freq: Once | INTRAMUSCULAR | Status: AC
  Administered 2012-08-23: 40 mg via INTRAMUSCULAR

## 2012-08-23 MED ORDER — TRAMADOL HCL 50 MG PO TABS: 50.0000 mg | ORAL_TABLET | Freq: Three times a day (TID) | ORAL | Status: DC | PRN

## 2012-08-23 NOTE — Progress Notes (Signed)
Patient ID: AGAM TUOHY, female   DOB: 1931/06/17, 78 y.o.   MRN: [id number]  Patient Active Problem List  Diagnosis  . H/O diarrhea  . Colon polyps  . Sciatica of right side  . Back pain, lumbosacral  . Hyperlipidemia LDL goal < 100  . Pharyngitis  . Osteoarthritis  . Hot flashes  . Degenerative joint disease involving multiple joints    Subjective:  CC:   No chief complaint on file.   HPI:   Emily Wu a 78 y.o. female who presents  Past Medical History  Diagnosis Date  . Hypertension   . S/P appendectomy   . S/P cholecystectomy   . S/P hysterectomy     Past Surgical History  Procedure Date  . Left knee     arthroscopy ,  [facility]  . Rotator cuff repair 2007,  2010    bilateral,  [facility]       . methylPREDNISolone acetate  40 mg Intramuscular Once     The following portions of the patient's history were reviewed and updated as appropriate: Allergies, current medications, and problem list.    Review of Systems:   12 Pt  review of systems was negative except those addressed in the HPI,     History   Social History  . Marital Status: Married    Spouse Name: N/A    Number of Children: N/A  . Years of Education: N/A   Occupational History  . Not on file.   Social History Main Topics  . Smoking status: Former Games developer  . Smokeless tobacco: Never Used  . Alcohol Use: No  . Drug Use: No  . Sexually Active: Not on file   Other Topics Concern  . Not on file   Social History Narrative  . No narrative on file    Objective:  Ht 5\' 3"  (1.6 m)  Wt 131 lb 4 oz (59.535 kg)  BMI 23.25 kg/m2  General appearance: alert, cooperative and appears stated age Ears: normal TM's and external ear canals both ears Throat: lips, mucosa, and tongue normal; teeth and gums normal Neck: no adenopathy, no carotid bruit, supple, symmetrical, trachea midline and thyroid not enlarged, symmetric, no tenderness/mass/nodules Back: symmetric, mild kyphosis,  No  vertebral tenderness.  No CVA tenderness. Lungs: clear to auscultation bilaterally Heart: regular rate and rhythm, S1, S2 normal, no murmur, click, rub or gallop Abdomen: soft, non-tender; bowel sounds normal; no masses,  no organomegaly Pulses: 2+ and symmetric Skin: Skin color, texture, turgor normal. No rashes or lesions Lymph nodes: Cervical, supraclavicular, and axillary nodes normal. YQM:VHQI hip nontender to palpation.  ROM decreased secondary to pain in abduction and internal flexion.,   Gait antalgic  Assessment and Plan:  Degenerative joint disease involving multiple joints She was disappointed that I was unable to offer her a cortisone injection in her hip. given her multiple joint complaints, I recommended a steroid injection and a six-day course of the tapering prednisone for widespread management of inflammation followed by PT referral for hip pain. She wants to see the hip specialist, Dr. Lavell Luster at [facility] in  January if her symptoms have not improved. I've also given her prescription for tramadol which she can take in addition to the Tylenol and the prednisone. PT referral in process.   Updated Medication List Outpatient Encounter Prescriptions as of 08/23/2012  Medication Sig Dispense Refill  . Ascorbic Acid (VITAMIN C) 100 MG tablet Take 500 mg by mouth daily.       Marland Kitchen  atorvastatin (LIPITOR) 20 MG tablet Take 20 mg by mouth daily.      . calcium carbonate (OS-CAL) 600 MG TABS Take 600 mg by mouth daily.       Marland Kitchen co-enzyme Q-10 30 MG capsule Take 30 mg by mouth daily.      Marland Kitchen estrogens, conjugated, (PREMARIN) 0.3 MG tablet Take 0.3 mg by mouth daily. Take daily for 21 days then do not take for 7 days.      . Multiple Vitamin (MULTIVITAMIN) tablet Take 1 tablet by mouth daily.      . predniSONE (STERAPRED UNI-PAK) 10 MG tablet 6 tablets on Day 1 , then reduce by 1 tablet daily until gone  21 tablet  0  . Probiotic Product (PROBIOTIC FORMULA PO) Take by mouth.      . traMADol  (ULTRAM) 50 MG tablet Take 1 tablet (50 mg total) by mouth every 8 (eight) hours as needed for pain.  90 tablet  2  . Vitamin D, Ergocalciferol, (DRISDOL) 50000 UNITS CAPS Take 5,000 Units by mouth daily.       Facility-Administered Encounter Medications as of 08/23/2012  Medication Dose Route Frequency Provider Last Rate Last Dose  . methylPREDNISolone acetate (DEPO-MEDROL) injection 40 mg  40 mg Intramuscular Once Sherlene Shams, MD   40 mg at 08/23/12 [id number]     Orders Placed This Encounter  Procedures  . Ambulatory referral to Orthopedic Surgery  . Ambulatory referral to Physical Therapy    No Follow-up on file.

## 2012-08-23 NOTE — Assessment & Plan Note (Signed)
She was disappointed that I was unable to offer her a cortisone injection in her hip. given her multiple joint complaints, I recommended a steroid injection and a six-day course of the tapering prednisone for widespread management of inflammation followed by PT referral for hip pain. She wants to see the hip specialist, Dr. Lavell Luster at [facility] in  January if her symptoms have not improved. I've also given her prescription for tramadol which she can take in addition to the Tylenol and the prednisone. PT referral in process.

## 2012-08-23 NOTE — Patient Instructions (Addendum)
I am going to prescribe a week of prednisone to help all of your aching joints .  I will refer you to the [facility]  PT  And we will make an appt with Dr. Lavell Luster in January    You can take up to 2000 mg of tylenol on a daily basis along with the prednisone.   You can use tramadol every 8 hours for severe pain .  It will not interfere with your other medications.

## 2012-08-31 ENCOUNTER — Encounter: Payer: Self-pay | Admitting: Internal Medicine

## 2012-08-31 DIAGNOSIS — M62838 Other muscle spasm: Secondary | ICD-10-CM | POA: Diagnosis not present

## 2012-08-31 DIAGNOSIS — IMO0001 Reserved for inherently not codable concepts without codable children: Secondary | ICD-10-CM | POA: Diagnosis not present

## 2012-08-31 DIAGNOSIS — M545 Low back pain, unspecified: Secondary | ICD-10-CM | POA: Diagnosis not present

## 2012-08-31 DIAGNOSIS — M25559 Pain in unspecified hip: Secondary | ICD-10-CM | POA: Diagnosis not present

## 2012-09-15 DIAGNOSIS — M19049 Primary osteoarthritis, unspecified hand: Secondary | ICD-10-CM | POA: Diagnosis not present

## 2012-09-15 DIAGNOSIS — M19039 Primary osteoarthritis, unspecified wrist: Secondary | ICD-10-CM | POA: Diagnosis not present

## 2012-09-15 DIAGNOSIS — M25549 Pain in joints of unspecified hand: Secondary | ICD-10-CM | POA: Diagnosis not present

## 2012-09-15 DIAGNOSIS — M25559 Pain in unspecified hip: Secondary | ICD-10-CM | POA: Diagnosis not present

## 2012-09-15 DIAGNOSIS — M25539 Pain in unspecified wrist: Secondary | ICD-10-CM | POA: Diagnosis not present

## 2012-09-15 DIAGNOSIS — M76899 Other specified enthesopathies of unspecified lower limb, excluding foot: Secondary | ICD-10-CM | POA: Diagnosis not present

## 2012-09-24 ENCOUNTER — Encounter: Payer: Self-pay | Admitting: Internal Medicine

## 2012-09-24 DIAGNOSIS — M6281 Muscle weakness (generalized): Secondary | ICD-10-CM | POA: Diagnosis not present

## 2012-09-24 DIAGNOSIS — IMO0001 Reserved for inherently not codable concepts without codable children: Secondary | ICD-10-CM | POA: Diagnosis not present

## 2012-09-24 DIAGNOSIS — M2560 Stiffness of unspecified joint, not elsewhere classified: Secondary | ICD-10-CM | POA: Diagnosis not present

## 2012-09-24 DIAGNOSIS — M25559 Pain in unspecified hip: Secondary | ICD-10-CM | POA: Diagnosis not present

## 2012-09-24 DIAGNOSIS — M545 Low back pain, unspecified: Secondary | ICD-10-CM | POA: Diagnosis not present

## 2012-10-24 ENCOUNTER — Encounter: Payer: Self-pay | Admitting: Internal Medicine

## 2012-10-24 DIAGNOSIS — M545 Low back pain, unspecified: Secondary | ICD-10-CM | POA: Diagnosis not present

## 2012-10-24 DIAGNOSIS — M25559 Pain in unspecified hip: Secondary | ICD-10-CM | POA: Diagnosis not present

## 2012-10-24 DIAGNOSIS — IMO0001 Reserved for inherently not codable concepts without codable children: Secondary | ICD-10-CM | POA: Diagnosis not present

## 2012-10-28 DIAGNOSIS — M25539 Pain in unspecified wrist: Secondary | ICD-10-CM | POA: Diagnosis not present

## 2012-10-28 DIAGNOSIS — M25519 Pain in unspecified shoulder: Secondary | ICD-10-CM | POA: Diagnosis not present

## 2012-11-11 DIAGNOSIS — M25439 Effusion, unspecified wrist: Secondary | ICD-10-CM | POA: Diagnosis not present

## 2012-11-11 DIAGNOSIS — M65839 Other synovitis and tenosynovitis, unspecified forearm: Secondary | ICD-10-CM | POA: Diagnosis not present

## 2012-11-11 DIAGNOSIS — M65849 Other synovitis and tenosynovitis, unspecified hand: Secondary | ICD-10-CM | POA: Diagnosis not present

## 2012-11-12 DIAGNOSIS — M65839 Other synovitis and tenosynovitis, unspecified forearm: Secondary | ICD-10-CM | POA: Diagnosis not present

## 2012-11-12 DIAGNOSIS — M65849 Other synovitis and tenosynovitis, unspecified hand: Secondary | ICD-10-CM | POA: Diagnosis not present

## 2012-11-12 DIAGNOSIS — M25519 Pain in unspecified shoulder: Secondary | ICD-10-CM | POA: Diagnosis not present

## 2012-11-12 DIAGNOSIS — M25539 Pain in unspecified wrist: Secondary | ICD-10-CM | POA: Diagnosis not present

## 2012-11-24 ENCOUNTER — Encounter: Payer: Self-pay | Admitting: Internal Medicine

## 2012-11-24 DIAGNOSIS — IMO0001 Reserved for inherently not codable concepts without codable children: Secondary | ICD-10-CM | POA: Diagnosis not present

## 2012-11-24 DIAGNOSIS — M67919 Unspecified disorder of synovium and tendon, unspecified shoulder: Secondary | ICD-10-CM | POA: Diagnosis not present

## 2012-11-24 DIAGNOSIS — M545 Low back pain, unspecified: Secondary | ICD-10-CM | POA: Diagnosis not present

## 2012-11-24 DIAGNOSIS — M25559 Pain in unspecified hip: Secondary | ICD-10-CM | POA: Diagnosis not present

## 2012-11-24 DIAGNOSIS — M6281 Muscle weakness (generalized): Secondary | ICD-10-CM | POA: Diagnosis not present

## 2012-11-24 HISTORY — PX: THROAT SURGERY: SHX803

## 2012-11-26 ENCOUNTER — Other Ambulatory Visit: Payer: Self-pay | Admitting: Internal Medicine

## 2012-11-26 NOTE — Telephone Encounter (Signed)
Pt is needing refill on Premarin 0.3 mg She uses [facility]

## 2012-11-29 MED ORDER — ESTROGENS CONJUGATED 0.3 MG PO TABS: 0.3000 mg | ORAL_TABLET | Freq: Every day | ORAL | Status: DC

## 2012-11-29 NOTE — Telephone Encounter (Signed)
Dr. Darrick Huntsman,  Patient is requesting refill on Premarin 0.3 mg Please advise

## 2012-12-01 ENCOUNTER — Other Ambulatory Visit: Payer: Self-pay | Admitting: Internal Medicine

## 2012-12-01 MED ORDER — ESTROGENS CONJUGATED 0.3 MG PO TABS: 0.3000 mg | ORAL_TABLET | Freq: Every day | ORAL | Status: DC

## 2012-12-01 NOTE — Telephone Encounter (Signed)
estrogens, conjugated, (PREMARIN) 0.3 MG tablet  The refill was sent to [facility] [address] instead of [facility].  Request is for [facility]

## 2012-12-01 NOTE — Telephone Encounter (Signed)
Med sent to correct pharmacy

## 2012-12-17 DIAGNOSIS — H1130 Conjunctival hemorrhage, unspecified eye: Secondary | ICD-10-CM | POA: Diagnosis not present

## 2012-12-25 ENCOUNTER — Encounter: Payer: Self-pay | Admitting: Internal Medicine

## 2012-12-25 DIAGNOSIS — M25549 Pain in joints of unspecified hand: Secondary | ICD-10-CM | POA: Diagnosis not present

## 2012-12-25 DIAGNOSIS — IMO0001 Reserved for inherently not codable concepts without codable children: Secondary | ICD-10-CM | POA: Diagnosis not present

## 2012-12-25 DIAGNOSIS — M6281 Muscle weakness (generalized): Secondary | ICD-10-CM | POA: Diagnosis not present

## 2012-12-25 DIAGNOSIS — M25649 Stiffness of unspecified hand, not elsewhere classified: Secondary | ICD-10-CM | POA: Diagnosis not present

## 2012-12-31 DIAGNOSIS — M25559 Pain in unspecified hip: Secondary | ICD-10-CM | POA: Diagnosis not present

## 2012-12-31 DIAGNOSIS — M76899 Other specified enthesopathies of unspecified lower limb, excluding foot: Secondary | ICD-10-CM | POA: Diagnosis not present

## 2013-01-22 ENCOUNTER — Encounter: Payer: Self-pay | Admitting: Internal Medicine

## 2013-02-16 ENCOUNTER — Other Ambulatory Visit: Payer: Self-pay | Admitting: *Deleted

## 2013-02-16 DIAGNOSIS — IMO0002 Reserved for concepts with insufficient information to code with codable children: Secondary | ICD-10-CM | POA: Diagnosis not present

## 2013-02-16 MED ORDER — ATORVASTATIN CALCIUM 20 MG PO TABS: 20.0000 mg | ORAL_TABLET | Freq: Every day | ORAL | Status: DC

## 2013-02-16 NOTE — Telephone Encounter (Signed)
Med filled.  

## 2013-03-10 DIAGNOSIS — IMO0002 Reserved for concepts with insufficient information to code with codable children: Secondary | ICD-10-CM | POA: Diagnosis not present

## 2013-03-29 DIAGNOSIS — IMO0002 Reserved for concepts with insufficient information to code with codable children: Secondary | ICD-10-CM | POA: Diagnosis not present

## 2013-04-08 DIAGNOSIS — R079 Chest pain, unspecified: Secondary | ICD-10-CM | POA: Diagnosis not present

## 2013-04-08 DIAGNOSIS — M25559 Pain in unspecified hip: Secondary | ICD-10-CM | POA: Diagnosis not present

## 2013-04-08 DIAGNOSIS — M545 Low back pain, unspecified: Secondary | ICD-10-CM | POA: Diagnosis not present

## 2013-04-27 DIAGNOSIS — IMO0002 Reserved for concepts with insufficient information to code with codable children: Secondary | ICD-10-CM | POA: Diagnosis not present

## 2013-05-18 DIAGNOSIS — H2589 Other age-related cataract: Secondary | ICD-10-CM | POA: Diagnosis not present

## 2013-05-18 DIAGNOSIS — H5200 Hypermetropia, unspecified eye: Secondary | ICD-10-CM | POA: Diagnosis not present

## 2013-05-18 DIAGNOSIS — H52229 Regular astigmatism, unspecified eye: Secondary | ICD-10-CM | POA: Diagnosis not present

## 2013-05-18 DIAGNOSIS — H524 Presbyopia: Secondary | ICD-10-CM | POA: Diagnosis not present

## 2013-05-19 DIAGNOSIS — H1130 Conjunctival hemorrhage, unspecified eye: Secondary | ICD-10-CM | POA: Diagnosis not present

## 2013-05-23 ENCOUNTER — Other Ambulatory Visit: Payer: Self-pay | Admitting: *Deleted

## 2013-05-23 MED ORDER — ATORVASTATIN CALCIUM 20 MG PO TABS: 20.0000 mg | ORAL_TABLET | Freq: Every day | ORAL | Status: DC

## 2013-06-13 DIAGNOSIS — R221 Localized swelling, mass and lump, neck: Secondary | ICD-10-CM | POA: Diagnosis not present

## 2013-06-13 DIAGNOSIS — R220 Localized swelling, mass and lump, head: Secondary | ICD-10-CM | POA: Diagnosis not present

## 2013-06-16 ENCOUNTER — Other Ambulatory Visit: Payer: Self-pay | Admitting: *Deleted

## 2013-06-17 MED ORDER — ATORVASTATIN CALCIUM 20 MG PO TABS: 20.0000 mg | ORAL_TABLET | Freq: Every day | ORAL | Status: DC

## 2013-06-27 ENCOUNTER — Ambulatory Visit (INDEPENDENT_AMBULATORY_CARE_PROVIDER_SITE_OTHER): Payer: Medicare Other | Admitting: Internal Medicine

## 2013-06-27 ENCOUNTER — Encounter: Payer: Self-pay | Admitting: Internal Medicine

## 2013-06-27 VITALS — BP 116/62 | HR 71 | Temp 98.0°F | Resp 14 | Wt 133.5 lb

## 2013-06-27 DIAGNOSIS — E041 Nontoxic single thyroid nodule: Secondary | ICD-10-CM

## 2013-06-27 DIAGNOSIS — M533 Sacrococcygeal disorders, not elsewhere classified: Secondary | ICD-10-CM

## 2013-06-27 DIAGNOSIS — Z79899 Other long term (current) drug therapy: Secondary | ICD-10-CM | POA: Diagnosis not present

## 2013-06-27 DIAGNOSIS — E559 Vitamin D deficiency, unspecified: Secondary | ICD-10-CM

## 2013-06-27 DIAGNOSIS — E785 Hyperlipidemia, unspecified: Secondary | ICD-10-CM

## 2013-06-27 DIAGNOSIS — M545 Low back pain, unspecified: Secondary | ICD-10-CM

## 2013-06-27 DIAGNOSIS — R7989 Other specified abnormal findings of blood chemistry: Secondary | ICD-10-CM

## 2013-06-27 LAB — COMPREHENSIVE METABOLIC PANEL
ALT: 36 U/L — ABNORMAL HIGH (ref 0–35)
AST: 39 U/L — ABNORMAL HIGH (ref 0–37)
Albumin: 3.7 g/dL (ref 3.5–5.2)
Alkaline Phosphatase: 54 U/L (ref 39–117)
BUN: 19 mg/dL (ref 6–23)
CO2: 31 mEq/L (ref 19–32)
Calcium: 9.6 mg/dL (ref 8.4–10.5)
Chloride: 106 mEq/L (ref 96–112)
Creatinine, Ser: 1.1 mg/dL (ref 0.4–1.2)
GFR: 53.58 mL/min — ABNORMAL LOW (ref >60.00)
Glucose, Bld: 75 mg/dL (ref 70–99)
Potassium: 4.1 mEq/L (ref 3.5–5.1)
Sodium: 142 mEq/L (ref 135–145)
Total Bilirubin: 0.5 mg/dL (ref 0.3–1.2)
Total Protein: 6.1 g/dL (ref 6.0–8.3)

## 2013-06-27 LAB — LDL CHOLESTEROL, DIRECT: Direct LDL: 79.3 mg/dL

## 2013-06-27 MED ORDER — ATORVASTATIN CALCIUM 20 MG PO TABS: 20.0000 mg | ORAL_TABLET | Freq: Every day | ORAL | Status: DC

## 2013-06-27 NOTE — Addendum Note (Signed)
Addended by: Sherlene Shams on: 06/27/2013 10:01 PM   Modules accepted: Orders

## 2013-06-27 NOTE — Assessment & Plan Note (Addendum)
LDL is < 100 and hepatic enzymes are mildly elevated . Will repeat hepatic panel in 6 weeks. Marland Kitchen

## 2013-06-27 NOTE — Patient Instructions (Addendum)
  We are checking your liver enzymes and an abbreviated cholesterol panel today in order to continue refilling your generic Lipitor  Return in December for your physical., and have "fasting " labs done a few days prior

## 2013-06-27 NOTE — Assessment & Plan Note (Signed)
Resolved

## 2013-06-27 NOTE — Progress Notes (Signed)
Patient ID: Emily Wu, female   DOB: 1931-08-23, 79 y.o.   MRN: [id number]  Patient Active Problem List   Diagnosis Date Noted  . Degenerative joint disease involving multiple joints 08/23/2012  . Hot flashes 07/07/2012  . Osteoarthritis 07/06/2012  . Hyperlipidemia LDL goal < 100 02/01/2012  . Colon polyps 01/30/2012  . Back pain, lumbosacral 01/30/2012    Subjective:  CC:   Chief Complaint  Patient presents with  . Follow-up    For medication refills    HPI:   Emily Wu a 79 y.o. female who presents for follow up on hyperlipidemia,  OA and hip pain, needing med refills  .  Her hip pain has resolved since she was treated by  Neurosurgeon by Reita May  At [place] .  She is tolerating her statin without muscle aches.  Past Medical History  Diagnosis Date  . Hypertension   . S/P appendectomy   . S/P cholecystectomy   . S/P hysterectomy     Past Surgical History  Procedure Laterality Date  . Left knee      arthroscopy ,  [facility]  . Rotator cuff repair  2007,  2010    bilateral,  [facility]       The following portions of the patient's history were reviewed and updated as appropriate: Allergies, current medications, and problem list.    Review of Systems:   Patient denies headache, fevers, malaise, unintentional weight loss, skin rash, eye pain, sinus congestion and sinus pain, sore throat, dysphagia,  hemoptysis , cough, dyspnea, wheezing, chest pain, palpitations, orthopnea, edema, abdominal pain, nausea, melena, diarrhea, constipation, flank pain, dysuria, hematuria, urinary  Frequency, nocturia, numbness, tingling, seizures,  Focal weakness, Loss of consciousness,  Tremor, insomnia, depression, anxiety, and suicidal ideation.     History   Social History  . Marital Status: Married    Spouse Name: N/A    Number of Children: N/A  . Years of Education: N/A   Occupational History  . Not on file.   Social History Main Topics  . Smoking status: Former Games developer   . Smokeless tobacco: Never Used  . Alcohol Use: No  . Drug Use: No  . Sexually Active: Not on file   Other Topics Concern  . Not on file   Social History Narrative  . No narrative on file    Objective:  BP 116/62  Pulse 71  Temp(Src) 98 F (36.7 C) (Oral)  Resp 14  Wt 133 lb 8 oz (60.555 kg)  BMI 23.65 kg/m2  SpO2 99%  General appearance: alert, cooperative and appears stated age Ears: normal TM's and external ear canals both ears Throat: lips, mucosa, and tongue normal; teeth and gums normal Neck: no adenopathy, no carotid bruit, supple, symmetrical, trachea midline and thyroid not enlarged, symmetric, no tenderness/mass/nodules Back: symmetric, no curvature. ROM normal. No CVA tenderness. Lungs: clear to auscultation bilaterally Heart: regular rate and rhythm, S1, S2 normal, no murmur, click, rub or gallop Abdomen: soft, non-tender; bowel sounds normal; no masses,  no organomegaly Pulses: 2+ and symmetric Skin: Skin color, texture, turgor normal. No rashes or lesions Lymph nodes: Cervical, supraclavicular, and axillary nodes normal.  Assessment and Plan:  Hyperlipidemia LDL goal < 100 LDL is < 100 and hepatic enzymes are mildly elevated . Will repeat hepatic panel in 6 weeks. .    Back pain, lumbosacral Resolved .     Updated Medication List Outpatient Encounter Prescriptions as of 06/27/2013  Medication Sig Dispense Refill  .  atorvastatin (LIPITOR) 20 MG tablet Take 1 tablet (20 mg total) by mouth daily. NEEDS APPT FOR FURTHER REFILLS  30 tablet  0  . calcium carbonate (OS-CAL) 600 MG TABS Take 600 mg by mouth daily.       Marland Kitchen estrogens, conjugated, (PREMARIN) 0.3 MG tablet Take 1 tablet (0.3 mg total) by mouth daily. Take daily for 21 days then do not take for 7 days.  21 tablet  11  . Multiple Vitamin (MULTIVITAMIN) tablet Take 1 tablet by mouth daily.      . Probiotic Product (PROBIOTIC FORMULA PO) Take by mouth.      . Ascorbic Acid (VITAMIN C) 100 MG tablet  Take 500 mg by mouth daily.       Marland Kitchen co-enzyme Q-10 30 MG capsule Take 30 mg by mouth daily.      . traMADol (ULTRAM) 50 MG tablet Take 1 tablet (50 mg total) by mouth every 8 (eight) hours as needed for pain.  90 tablet  2  . Vitamin D, Ergocalciferol, (DRISDOL) 50000 UNITS CAPS Take 5,000 Units by mouth daily.      . [DISCONTINUED] predniSONE (STERAPRED UNI-PAK) 10 MG tablet 6 tablets on Day 1 , then reduce by 1 tablet daily until gone  21 tablet  0   No facility-administered encounter medications on file as of 06/27/2013.

## 2013-06-28 LAB — VITAMIN D 25 HYDROXY (VIT D DEFICIENCY, FRACTURES): Vit D, 25-Hydroxy: 64 ng/mL (ref 30–89)

## 2013-06-29 DIAGNOSIS — R221 Localized swelling, mass and lump, neck: Secondary | ICD-10-CM | POA: Diagnosis not present

## 2013-06-29 DIAGNOSIS — R220 Localized swelling, mass and lump, head: Secondary | ICD-10-CM | POA: Diagnosis not present

## 2013-06-29 LAB — TSH+FREE T4
Free T4: 0.97 ng/dL (ref 0.82–1.77)
TSH: 5.01 u[IU]/mL — ABNORMAL HIGH (ref 0.450–4.500)

## 2013-06-29 NOTE — Addendum Note (Signed)
Addended by: Sherlene Shams on: 06/29/2013 07:00 AM   Modules accepted: Orders

## 2013-06-30 ENCOUNTER — Telehealth: Payer: Self-pay | Admitting: Internal Medicine

## 2013-06-30 ENCOUNTER — Encounter: Payer: Self-pay | Admitting: *Deleted

## 2013-06-30 NOTE — Telephone Encounter (Signed)
Pt came in today and wanted to know if the results to her ultra sound done yesterday @ [facility] are in Please advise Please mail results also

## 2013-06-30 NOTE — Addendum Note (Signed)
Addended by: Dennie Bible on: 06/30/2013 09:09 AM   Modules accepted: Orders

## 2013-07-01 NOTE — Telephone Encounter (Signed)
Left message for patient to return call.

## 2013-07-05 NOTE — Telephone Encounter (Signed)
Patient returning your call.

## 2013-07-06 NOTE — Telephone Encounter (Signed)
Thyroid ultrasound was ordered,  i have not received results yet

## 2013-07-06 NOTE — Telephone Encounter (Signed)
Talked with patient to confirm we have not received ultra sound patient stated she talked with [facility] they called her to come in for Biopsy and more films but patient could not tell anymore schedule for [facility] Tomorrow stated by patient.

## 2013-07-06 NOTE — Telephone Encounter (Signed)
I do not see where you ordered and Ultra sound on patient please advise if I have missed something.

## 2013-07-11 DIAGNOSIS — R220 Localized swelling, mass and lump, head: Secondary | ICD-10-CM | POA: Diagnosis not present

## 2013-07-14 ENCOUNTER — Telehealth: Payer: Self-pay | Admitting: *Deleted

## 2013-07-14 NOTE — Telephone Encounter (Signed)
Dr. [facility] called from [facility] saying patient reviewed there for lower back pain, but during exam a large swollen area was found to back of neck , Ultra sound was ordered and a needle biopsy was recommended biopsy did not yield results,Recommeded Excisional biopsy at this time  With referral to ENT, patient prefers for Biopsy and referral to be at [facility] , Dr. Reita May wanted to clarify this with you and advise of plan of care unless you prefer to talk with patient and make other recommendations. Office [phone]

## 2013-07-15 NOTE — Telephone Encounter (Signed)
I agree with his plan to refer to ENT for excisional biopsy

## 2013-07-15 NOTE — Telephone Encounter (Signed)
Left second message to return my call.

## 2013-07-15 NOTE — Telephone Encounter (Signed)
Left message for patient to return my call.

## 2013-07-18 ENCOUNTER — Telehealth: Payer: Self-pay | Admitting: Internal Medicine

## 2013-07-18 NOTE — Telephone Encounter (Signed)
Pt would like a call back as soon as possible. A [facility] Specialist was supposed to call our office about some things ???

## 2013-07-19 ENCOUNTER — Telehealth: Payer: Self-pay | Admitting: Emergency Medicine

## 2013-07-19 NOTE — Telephone Encounter (Signed)
Left detailed message for Dr Reita May nurse Annabelle Harman on answering machine.

## 2013-07-19 NOTE — Telephone Encounter (Signed)
Talked with patient this morning patient is to call Dr. Reita May for ENT referral.

## 2013-07-26 DIAGNOSIS — R220 Localized swelling, mass and lump, head: Secondary | ICD-10-CM | POA: Diagnosis not present

## 2013-08-04 DIAGNOSIS — Z79899 Other long term (current) drug therapy: Secondary | ICD-10-CM | POA: Diagnosis not present

## 2013-08-04 DIAGNOSIS — Z5181 Encounter for therapeutic drug level monitoring: Secondary | ICD-10-CM | POA: Diagnosis not present

## 2013-08-04 DIAGNOSIS — Z01818 Encounter for other preprocedural examination: Secondary | ICD-10-CM | POA: Diagnosis not present

## 2013-08-04 DIAGNOSIS — M25519 Pain in unspecified shoulder: Secondary | ICD-10-CM | POA: Diagnosis not present

## 2013-08-04 DIAGNOSIS — R9431 Abnormal electrocardiogram [ECG] [EKG]: Secondary | ICD-10-CM | POA: Diagnosis not present

## 2013-08-05 DIAGNOSIS — Z79899 Other long term (current) drug therapy: Secondary | ICD-10-CM | POA: Diagnosis not present

## 2013-08-05 DIAGNOSIS — K589 Irritable bowel syndrome without diarrhea: Secondary | ICD-10-CM | POA: Diagnosis not present

## 2013-08-05 DIAGNOSIS — M1990 Unspecified osteoarthritis, unspecified site: Secondary | ICD-10-CM | POA: Diagnosis not present

## 2013-08-05 DIAGNOSIS — R220 Localized swelling, mass and lump, head: Secondary | ICD-10-CM | POA: Diagnosis not present

## 2013-08-05 DIAGNOSIS — E785 Hyperlipidemia, unspecified: Secondary | ICD-10-CM | POA: Diagnosis not present

## 2013-08-11 DIAGNOSIS — R220 Localized swelling, mass and lump, head: Secondary | ICD-10-CM | POA: Diagnosis not present

## 2013-08-22 ENCOUNTER — Other Ambulatory Visit: Payer: Self-pay | Admitting: *Deleted

## 2013-08-22 MED ORDER — ATORVASTATIN CALCIUM 20 MG PO TABS: 20.0000 mg | ORAL_TABLET | Freq: Every day | ORAL | Status: DC

## 2013-09-05 ENCOUNTER — Telehealth: Payer: Self-pay | Admitting: Internal Medicine

## 2013-09-05 NOTE — Telephone Encounter (Signed)
estrogens, conjugated, (PREMARIN) 0.3 MG tablet

## 2013-09-06 MED ORDER — ESTROGENS CONJUGATED 0.3 MG PO TABS: 0.3000 mg | ORAL_TABLET | Freq: Every day | ORAL | Status: DC

## 2013-09-08 DIAGNOSIS — R220 Localized swelling, mass and lump, head: Secondary | ICD-10-CM | POA: Diagnosis not present

## 2013-11-01 ENCOUNTER — Telehealth: Payer: Self-pay | Admitting: Internal Medicine

## 2013-11-01 NOTE — Telephone Encounter (Signed)
Pt cut her leg on Sunday.  Asking if she can get her Tetanus shot as she believes this is overdue.  Does not feel that she needs to be seen for the cut.

## 2013-11-02 ENCOUNTER — Encounter (INDEPENDENT_AMBULATORY_CARE_PROVIDER_SITE_OTHER): Payer: Self-pay

## 2013-11-02 ENCOUNTER — Ambulatory Visit: Payer: Medicare Other

## 2013-11-02 DIAGNOSIS — Z23 Encounter for immunization: Secondary | ICD-10-CM

## 2013-11-02 NOTE — Telephone Encounter (Signed)
Patient scheduled to come in for TDap today patient does not want to be seen for cut on leg stated it just a scrape is okay to give tdap patient over due.

## 2013-11-02 NOTE — Telephone Encounter (Signed)
YesDarel Wu document that she did not want to be seen .  Can give her the TDaP

## 2013-12-01 ENCOUNTER — Other Ambulatory Visit: Payer: Self-pay | Admitting: Internal Medicine

## 2013-12-30 ENCOUNTER — Encounter: Payer: Medicare Other | Admitting: Internal Medicine

## 2014-01-09 ENCOUNTER — Other Ambulatory Visit: Payer: Self-pay | Admitting: Internal Medicine

## 2014-01-10 LAB — HM MAMMOGRAPHY: HM Mammogram: NORMAL

## 2014-01-10 LAB — HM PAP SMEAR: HM Pap smear: NORMAL

## 2014-01-18 ENCOUNTER — Other Ambulatory Visit (HOSPITAL_COMMUNITY)
Admission: RE | Admit: 2014-01-18 | Discharge: 2014-01-18 | Disposition: A | Discharge status: Home / Self Care | Payer: Medicare Other | Source: Ambulatory Visit | Attending: Internal Medicine | Admitting: Internal Medicine

## 2014-01-18 ENCOUNTER — Ambulatory Visit (INDEPENDENT_AMBULATORY_CARE_PROVIDER_SITE_OTHER): Payer: Medicare Other | Admitting: Internal Medicine

## 2014-01-18 ENCOUNTER — Encounter: Payer: Self-pay | Admitting: Internal Medicine

## 2014-01-18 VITALS — BP 128/62 | HR 79 | Temp 98.1°F | Resp 18 | Ht 62.0 in | Wt 136.0 lb

## 2014-01-18 DIAGNOSIS — Z8719 Personal history of other diseases of the digestive system: Secondary | ICD-10-CM

## 2014-01-18 DIAGNOSIS — Z13820 Encounter for screening for osteoporosis: Secondary | ICD-10-CM | POA: Diagnosis not present

## 2014-01-18 DIAGNOSIS — E785 Hyperlipidemia, unspecified: Secondary | ICD-10-CM

## 2014-01-18 DIAGNOSIS — N951 Menopausal and female climacteric states: Secondary | ICD-10-CM

## 2014-01-18 DIAGNOSIS — Z9882 Breast implant status: Secondary | ICD-10-CM

## 2014-01-18 DIAGNOSIS — Z79899 Other long term (current) drug therapy: Secondary | ICD-10-CM | POA: Diagnosis not present

## 2014-01-18 DIAGNOSIS — A0472 Enterocolitis due to Clostridium difficile, not specified as recurrent: Secondary | ICD-10-CM

## 2014-01-18 DIAGNOSIS — Z124 Encounter for screening for malignant neoplasm of cervix: Secondary | ICD-10-CM | POA: Diagnosis not present

## 2014-01-18 DIAGNOSIS — K635 Polyp of colon: Secondary | ICD-10-CM

## 2014-01-18 DIAGNOSIS — Z1211 Encounter for screening for malignant neoplasm of colon: Secondary | ICD-10-CM | POA: Diagnosis not present

## 2014-01-18 DIAGNOSIS — R5381 Other malaise: Secondary | ICD-10-CM

## 2014-01-18 DIAGNOSIS — R232 Flushing: Secondary | ICD-10-CM

## 2014-01-18 DIAGNOSIS — Z0000 Encounter for general adult medical examination without abnormal findings: Secondary | ICD-10-CM | POA: Diagnosis not present

## 2014-01-18 DIAGNOSIS — R5383 Other fatigue: Secondary | ICD-10-CM | POA: Diagnosis not present

## 2014-01-18 DIAGNOSIS — Z1151 Encounter for screening for human papillomavirus (HPV): Secondary | ICD-10-CM | POA: Diagnosis not present

## 2014-01-18 DIAGNOSIS — Z978 Presence of other specified devices: Secondary | ICD-10-CM

## 2014-01-18 DIAGNOSIS — D126 Benign neoplasm of colon, unspecified: Secondary | ICD-10-CM

## 2014-01-18 DIAGNOSIS — E559 Vitamin D deficiency, unspecified: Secondary | ICD-10-CM | POA: Diagnosis not present

## 2014-01-18 DIAGNOSIS — Z23 Encounter for immunization: Secondary | ICD-10-CM

## 2014-01-18 DIAGNOSIS — M545 Low back pain, unspecified: Secondary | ICD-10-CM

## 2014-01-18 LAB — COMPREHENSIVE METABOLIC PANEL
ALT: 19 U/L (ref 0–35)
AST: 25 U/L (ref 0–37)
Albumin: 3.5 g/dL (ref 3.5–5.2)
Alkaline Phosphatase: 58 U/L (ref 39–117)
BUN: 22 mg/dL (ref 6–23)
CO2: 30 mEq/L (ref 19–32)
Calcium: 9.4 mg/dL (ref 8.4–10.5)
Chloride: 104 mEq/L (ref 96–112)
Creatinine, Ser: 1.1 mg/dL (ref 0.4–1.2)
GFR: 53.51 mL/min — ABNORMAL LOW (ref >60.00)
Glucose, Bld: 70 mg/dL (ref 70–99)
Potassium: 4.5 mEq/L (ref 3.5–5.1)
Sodium: 139 mEq/L (ref 135–145)
Total Bilirubin: 0.6 mg/dL (ref 0.3–1.2)
Total Protein: 6.3 g/dL (ref 6.0–8.3)

## 2014-01-18 LAB — CBC WITH DIFFERENTIAL/PLATELET
Basophils Absolute: 0 10*3/uL (ref 0.0–0.1)
Basophils Relative: 0.7 % (ref 0.0–3.0)
Eosinophils Absolute: 0.1 10*3/uL (ref 0.0–0.7)
Eosinophils Relative: 2.4 % (ref 0.0–5.0)
HCT: 38.4 % (ref 36.0–46.0)
Hemoglobin: 12.5 g/dL (ref 12.0–15.0)
Lymphocytes Relative: 49.6 % — ABNORMAL HIGH (ref 12.0–46.0)
Lymphs Abs: 2.9 10*3/uL (ref 0.7–4.0)
MCHC: 32.5 g/dL (ref 30.0–36.0)
MCV: 95.8 fl (ref 78.0–100.0)
Monocytes Absolute: 0.5 10*3/uL (ref 0.1–1.0)
Monocytes Relative: 8.8 % (ref 3.0–12.0)
Neutro Abs: 2.2 10*3/uL (ref 1.4–7.7)
Neutrophils Relative %: 38.5 % — ABNORMAL LOW (ref 43.0–77.0)
Platelets: 243 10*3/uL (ref 150.0–400.0)
RBC: 4.01 Mil/uL (ref 3.87–5.11)
RDW: 13.3 % (ref 11.5–14.6)
WBC: 5.8 10*3/uL (ref 4.5–10.5)

## 2014-01-18 LAB — TSH: TSH: 4.56 u[IU]/mL (ref 0.35–5.50)

## 2014-01-18 NOTE — Progress Notes (Signed)
Patient ID: Emily Wu, female   DOB: 1930/11/30, 80 y.o.   MRN: [id number]   The patient is here for annual Medicare wellness examination and management of other chronic and acute problems.  She has no complaints today.  Has been taking amitryptiline for years,  25 mg at bedtime for management of IBS, prescribed originally by her gasrtoenterologist Dr. Kinnie Scales , without side effects and requests continued refills.    The risk factors are reflected in the social history.  The roster of all physicians providing medical care to patient - is listed in the Snapshot section of the chart.  Activities of daily living:  The patient is 100% independent in all ADLs: dressing, toileting, feeding as well as independent mobility  Home safety : The patient has smoke detectors in the home. They wear seatbelts.  There are no firearms at home. There is no violence in the home.   There is no risks for hepatitis, STDs or HIV. There is no   history of blood transfusion. They have no travel history to infectious disease endemic areas of the world.  The patient has seen their dentist in the last six month. They have seen their eye doctor in the last year. They admit to slight hearing difficulty with regard to whispered voices and some television programs.  They have deferred audiologic testing in the last year.  They do not  have excessive sun exposure. Discussed the need for sun protection: hats, long sleeves and use of sunscreen if there is significant sun exposure.   Diet: the importance of a healthy diet is discussed. They do have a healthy diet.  The benefits of regular aerobic exercise were discussed. She walks 4 times per week ,  20 minutes.   Depression screen: there are no signs or vegative symptoms of depression- irritability, change in appetite, anhedonia, sadness/tearfullness.  Cognitive assessment: the patient manages all their financial and personal affairs and is actively engaged. They could  relate day,date,year and events; recalled 2/3 objects at 3 minutes; performed clock-face test normally.  The following portions of the patient's history were reviewed and updated as appropriate: allergies, current medications, past family history, past medical history,  past surgical history, past social history  and problem list.  Visual acuity was not assessed per patient preference since she has regular follow up with her ophthalmologist. Hearing and body mass index were assessed and reviewed.   During the course of the visit the patient was educated and counseled about appropriate screening and preventive services including : fall prevention , diabetes screening, nutrition counseling, colorectal cancer screening, and recommended immunizations.    Objective:   BP 128/62  Pulse 79  Temp(Src) 98.1 F (36.7 C) (Oral)  Resp 18  Ht  (1.575 m)  Wt 136 lb (61.689 kg)  BMI 24.87 kg/m2  SpO2 99%  General Appearance:    Alert, cooperative, no distress, appears stated age  Head:    Normocephalic, without obvious abnormality, atraumatic  Eyes:    PERRL, conjunctiva/corneas clear, EOM's intact, fundi    benign, both eyes  Ears:    Normal TM's and external ear canals, both ears  Nose:   Nares normal, septum midline, mucosa normal, no drainage    or sinus tenderness  Throat:   Lips, mucosa, and tongue normal; teeth and gums normal  Neck:   Supple, symmetrical, trachea midline, no adenopathy;    thyroid:  no enlargement/tenderness/nodules; no carotid   bruit or JVD  Back:  Symmetric, no curvature, ROM normal, no CVA tenderness  Lungs:     Clear to auscultation bilaterally, respirations unlabored  Chest Wall:    No tenderness or deformity   Heart:    Regular rate and rhythm, S1 and S2 normal, no murmur, rub   or gallop  Breast Exam:    No tenderness, masses, or nipple abnormality  Abdomen:     Soft, non-tender, bowel sounds active all four quadrants,    no masses, no organomegaly   Genitalia:    Pelvic: cervix normal in appearance, external genitalia normal, no adnexal masses or tenderness, no cervical motion tenderness, rectovaginal septum normal, uterus normal size, shape, and consistency and vagina normal without discharge  Extremities:   Extremities normal, atraumatic, no cyanosis or edema  Pulses:   2+ and symmetric all extremities  Skin:   Skin color, texture, turgor normal, no rashes or lesions  Lymph nodes:   Cervical, supraclavicular, and axillary nodes normal  Neurologic:   CNII-XII intact, normal strength, sensation and reflexes    throughout   Assessment and plan:  Encounter for preventive health examination Annual comprehensive exam was done including breast, pelvic and PAP smear. All screenings have been addressed . Vaginal PAP smear was done per patient request due to remote history of endometrial/cervical CA.  DEXA scan was ordered as it has been > 5 years.   History of IBS Managed with daily low dose amitriptyline prescribed initially by Dr. Earlean Shawl.  Refills given, as she is having no adverse effects.   Low back pain potentially associated with radiculopathy Currently managed with massage and prn PT  Colitis due to Clostridium difficile No longer taking a probiotic  Colon polyps We discussed continued periodic screening with Dr. Earlean Shawl . Given her age,  Liana Gerold an alternative with fecal immunchemical testing.  Hyperlipidemia LDL goal < 100 LDL is < 100 and hepatic enzymes were mildly elevated 6 months ago but she did not return with repeat hepatic panel .  This was done today and normal. Will resume statin therapy.  Lab Results  Component Value Date   ALT 19 01/18/2014   AST 25 01/18/2014   ALKPHOS 58 01/18/2014   BILITOT 0.6 01/18/2014    Hot flashes She prefers to continue premarin for management of menopause and is aware of the increased risk of heart disease and thromboembolism.   S/P breast augmentation Patient does not do self breas  exams due to history of saline implants.  Breast exam was normal today and screening mammogram ordered   Updated Medication List Outpatient Encounter Prescriptions as of 01/18/2014  Medication Sig  . amitriptyline (ELAVIL) 25 MG tablet Take 1 tablet (25 mg total) by mouth daily.  . Ascorbic Acid (VITAMIN C) 100 MG tablet Take 500 mg by mouth daily.   . meloxicam (MOBIC) 15 MG tablet Take 1 tablet by mouth daily.  . Multiple Vitamin (MULTIVITAMIN) tablet Take 1 tablet by mouth daily.  Marland Kitchen PREMARIN 0.3 MG tablet TAKE ONE TABLET BY MOUTH EVERY DAY  . [DISCONTINUED] amitriptyline (ELAVIL) 25 MG tablet Take 1 tablet by mouth daily.  . [DISCONTINUED] ezetimibe-simvastatin (VYTORIN) 10-20 MG per tablet Take 1 tablet by mouth daily.  Marland Kitchen atorvastatin (LIPITOR) 20 MG tablet Take 1 tablet (20 mg total) by mouth daily.  . calcium carbonate (OS-CAL) 600 MG TABS Take 600 mg by mouth daily.   Marland Kitchen co-enzyme Q-10 30 MG capsule Take 30 mg by mouth daily.  . Probiotic Product (PROBIOTIC FORMULA PO) Take by mouth.  . [  DISCONTINUED] atorvastatin (LIPITOR) 20 MG tablet Take 1 tablet (20 mg total) by mouth daily.  . [DISCONTINUED] Vitamin D, Ergocalciferol, (DRISDOL) 50000 UNITS CAPS Take 5,000 Units by mouth daily.

## 2014-01-18 NOTE — Patient Instructions (Signed)
You had your annual Medicare wellness exam today  We will schedule your bone density test soon andyou can schedule your own mammogram.  Please use the stool kit to send [place] back a sample to test for blood.  This is your colon CA screening test.   You You received the pneumonia vaccine today.  You are NOT  Taking Vytorin,  You have been taking atorvastatin for cholesterol. This was confirmed with [facility]

## 2014-01-19 ENCOUNTER — Encounter: Payer: Self-pay | Admitting: Internal Medicine

## 2014-01-19 DIAGNOSIS — Z0000 Encounter for general adult medical examination without abnormal findings: Secondary | ICD-10-CM | POA: Insufficient documentation

## 2014-01-19 DIAGNOSIS — Z9882 Breast implant status: Secondary | ICD-10-CM | POA: Insufficient documentation

## 2014-01-19 DIAGNOSIS — Z8719 Personal history of other diseases of the digestive system: Secondary | ICD-10-CM | POA: Insufficient documentation

## 2014-01-19 LAB — VITAMIN D 25 HYDROXY (VIT D DEFICIENCY, FRACTURES): Vit D, 25-Hydroxy: 66 ng/mL (ref 30–89)

## 2014-01-19 MED ORDER — AMITRIPTYLINE HCL 25 MG PO TABS: 25.0000 mg | ORAL_TABLET | Freq: Every day | ORAL | Status: DC

## 2014-01-19 MED ORDER — ATORVASTATIN CALCIUM 20 MG PO TABS: 20.0000 mg | ORAL_TABLET | Freq: Every day | ORAL | Status: DC

## 2014-01-19 NOTE — Assessment & Plan Note (Signed)
We discussed continued periodic screening with Dr. Earlean Shawl . Given her age,  Emily Wu an alternative with fecal immunchemical testing.

## 2014-01-19 NOTE — Assessment & Plan Note (Signed)
Patient does not do self breas exams due to history of saline implants.  Breast exam was normal today and screening mammogram ordered

## 2014-01-19 NOTE — Assessment & Plan Note (Signed)
Currently managed with massage and prn PT

## 2014-01-19 NOTE — Assessment & Plan Note (Signed)
She prefers to continue premarin for management of menopause and is aware of the increased risk of heart disease and thromboembolism.

## 2014-01-19 NOTE — Assessment & Plan Note (Signed)
No longer taking a probiotic

## 2014-01-19 NOTE — Assessment & Plan Note (Signed)
LDL is < 100 and hepatic enzymes were mildly elevated 6 months ago but she did not return with repeat hepatic panel .  This was done today and normal. Will resume statin therapy.  Lab Results  Component Value Date   ALT 19 01/18/2014   AST 25 01/18/2014   ALKPHOS 58 01/18/2014   BILITOT 0.6 01/18/2014

## 2014-01-19 NOTE — Assessment & Plan Note (Addendum)
Managed with daily low dose amitriptyline prescribed initially by Dr. Earlean Shawl.  Refills given, as she is having no adverse effects.

## 2014-01-19 NOTE — Assessment & Plan Note (Addendum)
Annual comprehensive exam was done including breast, pelvic and PAP smear. All screenings have been addressed . Vaginal PAP smear was done per patient request due to remote history of endometrial/cervical CA.  DEXA scan was ordered as it has been > 5 years.

## 2014-01-20 ENCOUNTER — Encounter: Payer: Self-pay | Admitting: *Deleted

## 2014-01-25 DIAGNOSIS — L659 Nonscarring hair loss, unspecified: Secondary | ICD-10-CM | POA: Diagnosis not present

## 2014-01-25 DIAGNOSIS — L820 Inflamed seborrheic keratosis: Secondary | ICD-10-CM | POA: Diagnosis not present

## 2014-01-25 DIAGNOSIS — L570 Actinic keratosis: Secondary | ICD-10-CM | POA: Diagnosis not present

## 2014-01-25 DIAGNOSIS — L821 Other seborrheic keratosis: Secondary | ICD-10-CM | POA: Diagnosis not present

## 2014-01-25 DIAGNOSIS — L738 Other specified follicular disorders: Secondary | ICD-10-CM | POA: Diagnosis not present

## 2014-01-26 ENCOUNTER — Encounter: Payer: Self-pay | Admitting: Emergency Medicine

## 2014-02-03 ENCOUNTER — Emergency Department (HOSPITAL_COMMUNITY): Payer: Medicare Other

## 2014-02-03 ENCOUNTER — Observation Stay (HOSPITAL_COMMUNITY)
Admission: EM | Admit: 2014-02-03 | Discharge: 2014-02-04 | Disposition: A | Discharge status: Home / Self Care | Payer: Medicare Other | Attending: Internal Medicine | Admitting: Internal Medicine

## 2014-02-03 ENCOUNTER — Encounter (HOSPITAL_COMMUNITY): Payer: Self-pay | Admitting: Emergency Medicine

## 2014-02-03 DIAGNOSIS — K635 Polyp of colon: Secondary | ICD-10-CM

## 2014-02-03 DIAGNOSIS — Z888 Allergy status to other drugs, medicaments and biological substances status: Secondary | ICD-10-CM | POA: Diagnosis not present

## 2014-02-03 DIAGNOSIS — E785 Hyperlipidemia, unspecified: Secondary | ICD-10-CM | POA: Diagnosis not present

## 2014-02-03 DIAGNOSIS — K3189 Other diseases of stomach and duodenum: Secondary | ICD-10-CM | POA: Diagnosis not present

## 2014-02-03 DIAGNOSIS — Z0000 Encounter for general adult medical examination without abnormal findings: Secondary | ICD-10-CM

## 2014-02-03 DIAGNOSIS — Z885 Allergy status to narcotic agent status: Secondary | ICD-10-CM | POA: Diagnosis not present

## 2014-02-03 DIAGNOSIS — R0789 Other chest pain: Secondary | ICD-10-CM | POA: Diagnosis not present

## 2014-02-03 DIAGNOSIS — Z882 Allergy status to sulfonamides status: Secondary | ICD-10-CM | POA: Insufficient documentation

## 2014-02-03 DIAGNOSIS — I10 Essential (primary) hypertension: Secondary | ICD-10-CM | POA: Diagnosis not present

## 2014-02-03 DIAGNOSIS — R232 Flushing: Secondary | ICD-10-CM

## 2014-02-03 DIAGNOSIS — M545 Low back pain, unspecified: Secondary | ICD-10-CM

## 2014-02-03 DIAGNOSIS — R05 Cough: Secondary | ICD-10-CM | POA: Diagnosis not present

## 2014-02-03 DIAGNOSIS — Z87891 Personal history of nicotine dependence: Secondary | ICD-10-CM | POA: Diagnosis not present

## 2014-02-03 DIAGNOSIS — R059 Cough, unspecified: Secondary | ICD-10-CM | POA: Diagnosis not present

## 2014-02-03 DIAGNOSIS — Z8719 Personal history of other diseases of the digestive system: Secondary | ICD-10-CM

## 2014-02-03 DIAGNOSIS — R1013 Epigastric pain: Secondary | ICD-10-CM | POA: Diagnosis present

## 2014-02-03 DIAGNOSIS — Z9882 Breast implant status: Secondary | ICD-10-CM

## 2014-02-03 DIAGNOSIS — R079 Chest pain, unspecified: Secondary | ICD-10-CM | POA: Diagnosis not present

## 2014-02-03 DIAGNOSIS — Z8542 Personal history of malignant neoplasm of other parts of uterus: Secondary | ICD-10-CM | POA: Insufficient documentation

## 2014-02-03 DIAGNOSIS — Z79899 Other long term (current) drug therapy: Secondary | ICD-10-CM | POA: Insufficient documentation

## 2014-02-03 DIAGNOSIS — A0472 Enterocolitis due to Clostridium difficile, not specified as recurrent: Secondary | ICD-10-CM

## 2014-02-03 DIAGNOSIS — Z880 Allergy status to penicillin: Secondary | ICD-10-CM | POA: Diagnosis not present

## 2014-02-03 HISTORY — DX: Other skin changes: R23.8

## 2014-02-03 HISTORY — DX: Spontaneous ecchymoses: R23.3

## 2014-02-03 HISTORY — DX: Hemorrhagic condition, unspecified: D69.9

## 2014-02-03 HISTORY — DX: Unspecified osteoarthritis, unspecified site: M19.90

## 2014-02-03 HISTORY — DX: Pure hypercholesterolemia, unspecified: E78.00

## 2014-02-03 HISTORY — DX: Malignant neoplasm of cervix uteri, unspecified: C53.9

## 2014-02-03 LAB — CBC
HCT: 39.3 % (ref 36.0–46.0)
Hemoglobin: 13.7 g/dL (ref 12.0–15.0)
MCH: 32 pg (ref 26.0–34.0)
MCHC: 34.9 g/dL (ref 30.0–36.0)
MCV: 91.8 fL (ref 78.0–100.0)
Platelets: 203 10*3/uL (ref 150–400)
RBC: 4.28 MIL/uL (ref 3.87–5.11)
RDW: 13.2 % (ref 11.5–15.5)
WBC: 6.5 10*3/uL (ref 4.0–10.5)

## 2014-02-03 LAB — LIPASE, BLOOD: Lipase: 18 U/L (ref 11–59)

## 2014-02-03 LAB — BASIC METABOLIC PANEL WITH GFR
BUN: 14 mg/dL (ref 6–23)
CO2: 27 meq/L (ref 19–32)
Calcium: 9.4 mg/dL (ref 8.4–10.5)
Chloride: 106 meq/L (ref 96–112)
Creatinine, Ser: 0.99 mg/dL (ref 0.50–1.10)
GFR calc Af Amer: 61 mL/min — ABNORMAL LOW
GFR calc non Af Amer: 52 mL/min — ABNORMAL LOW
Glucose, Bld: 88 mg/dL (ref 70–99)
Potassium: 4.3 meq/L (ref 3.7–5.3)
Sodium: 144 meq/L (ref 137–147)

## 2014-02-03 LAB — TROPONIN I: Troponin I: <0.3 ng/mL (ref <0.30)

## 2014-02-03 LAB — CREATININE, SERUM
Creatinine, Ser: 0.94 mg/dL (ref 0.50–1.10)
GFR calc Af Amer: 65 mL/min — ABNORMAL LOW (ref >90)
GFR calc non Af Amer: 56 mL/min — ABNORMAL LOW (ref >90)

## 2014-02-03 LAB — I-STAT TROPONIN, ED: Troponin i, poc: 0.01 ng/mL (ref 0.00–0.08)

## 2014-02-03 MED ORDER — ADULT MULTIVITAMIN W/MINERALS CH
1.0000 | ORAL_TABLET | Freq: Every day | ORAL | Status: DC
  Administered 2014-02-03 – 2014-02-04 (×2): 1 via ORAL
  Filled 2014-02-03 (×2): qty 1

## 2014-02-03 MED ORDER — SODIUM CHLORIDE 0.9 % IJ SOLN
3.0000 mL | Freq: Two times a day (BID) | INTRAMUSCULAR | Status: DC
  Administered 2014-02-03: 3 mL via INTRAVENOUS

## 2014-02-03 MED ORDER — ALIGN PO CAPS: 1.0000 | ORAL_CAPSULE | Freq: Every day | ORAL | Status: DC

## 2014-02-03 MED ORDER — ASPIRIN EC 81 MG PO TBEC
81.0000 mg | DELAYED_RELEASE_TABLET | Freq: Every day | ORAL | Status: DC
  Administered 2014-02-04: 81 mg via ORAL
  Filled 2014-02-03: qty 1

## 2014-02-03 MED ORDER — ONE-DAILY MULTI VITAMINS PO TABS: 1.0000 | ORAL_TABLET | Freq: Every day | ORAL | Status: DC

## 2014-02-03 MED ORDER — EZETIMIBE-SIMVASTATIN 10-20 MG PO TABS: 1.0000 | ORAL_TABLET | Freq: Every day | ORAL | Status: DC

## 2014-02-03 MED ORDER — GI COCKTAIL ~~LOC~~: 30.0000 mL | Freq: Three times a day (TID) | ORAL | Status: DC | PRN

## 2014-02-03 MED ORDER — ALUM & MAG HYDROXIDE-SIMETH 200-200-20 MG/5ML PO SUSP: 30.0000 mL | Freq: Four times a day (QID) | ORAL | Status: DC | PRN

## 2014-02-03 MED ORDER — SODIUM CHLORIDE 0.9 % IV SOLN: 250.0000 mL | INTRAVENOUS | Status: DC | PRN

## 2014-02-03 MED ORDER — ONDANSETRON HCL 4 MG/2ML IJ SOLN: 4.0000 mg | Freq: Four times a day (QID) | INTRAMUSCULAR | Status: DC | PRN

## 2014-02-03 MED ORDER — NITROGLYCERIN 0.4 MG SL SUBL
0.4000 mg | SUBLINGUAL_TABLET | SUBLINGUAL | Status: AC | PRN
  Administered 2014-02-03 (×3): 0.4 mg via SUBLINGUAL
  Filled 2014-02-03: qty 1

## 2014-02-03 MED ORDER — POLYETHYLENE GLYCOL 3350 17 G PO PACK
17.0000 g | PACK | Freq: Two times a day (BID) | ORAL | Status: DC
  Administered 2014-02-03: 17 g via ORAL
  Filled 2014-02-03 (×4): qty 1

## 2014-02-03 MED ORDER — ESTROGENS CONJUGATED 0.3 MG PO TABS
0.3000 mg | ORAL_TABLET | Freq: Every day | ORAL | Status: DC
  Administered 2014-02-03 – 2014-02-04 (×2): 0.3 mg via ORAL
  Filled 2014-02-03 (×2): qty 1

## 2014-02-03 MED ORDER — AMITRIPTYLINE HCL 25 MG PO TABS
25.0000 mg | ORAL_TABLET | Freq: Every day | ORAL | Status: DC
  Administered 2014-02-03 – 2014-02-04 (×2): 25 mg via ORAL
  Filled 2014-02-03 (×2): qty 1

## 2014-02-03 MED ORDER — PANTOPRAZOLE SODIUM 40 MG IV SOLR
40.0000 mg | Freq: Every day | INTRAVENOUS | Status: DC
  Administered 2014-02-03: 40 mg via INTRAVENOUS
  Filled 2014-02-03 (×2): qty 40

## 2014-02-03 MED ORDER — SODIUM CHLORIDE 0.9 % IV SOLN
Freq: Once | INTRAVENOUS | Status: AC
  Administered 2014-02-03: 14:00:00 via INTRAVENOUS

## 2014-02-03 MED ORDER — MORPHINE SULFATE 4 MG/ML IJ SOLN
4.0000 mg | Freq: Once | INTRAMUSCULAR | Status: DC
  Filled 2014-02-03: qty 1

## 2014-02-03 MED ORDER — ACETAMINOPHEN 325 MG PO TABS
650.0000 mg | ORAL_TABLET | Freq: Four times a day (QID) | ORAL | Status: DC | PRN
Start: 2014-02-03 — End: 2014-02-04

## 2014-02-03 MED ORDER — RISAQUAD PO CAPS
1.0000 | ORAL_CAPSULE | Freq: Every day | ORAL | Status: DC
  Administered 2014-02-03 – 2014-02-04 (×2): 1 via ORAL
  Filled 2014-02-03 (×2): qty 1

## 2014-02-03 MED ORDER — ASPIRIN 81 MG PO CHEW
162.0000 mg | CHEWABLE_TABLET | Freq: Once | ORAL | Status: AC
  Administered 2014-02-03: 162 mg via ORAL
  Filled 2014-02-03: qty 2

## 2014-02-03 MED ORDER — ENOXAPARIN SODIUM 40 MG/0.4ML ~~LOC~~ SOLN
40.0000 mg | SUBCUTANEOUS | Status: DC
  Administered 2014-02-03: 40 mg via SUBCUTANEOUS
  Filled 2014-02-03 (×2): qty 0.4

## 2014-02-03 MED ORDER — ACETAMINOPHEN 650 MG RE SUPP
650.0000 mg | Freq: Four times a day (QID) | RECTAL | Status: DC | PRN
Start: 2014-02-03 — End: 2014-02-04

## 2014-02-03 MED ORDER — NITROGLYCERIN 0.4 MG SL SUBL: 0.4000 mg | SUBLINGUAL_TABLET | SUBLINGUAL | Status: DC | PRN

## 2014-02-03 MED ORDER — ONDANSETRON HCL 4 MG PO TABS
4.0000 mg | ORAL_TABLET | Freq: Four times a day (QID) | ORAL | Status: DC | PRN
Start: 2014-02-03 — End: 2014-02-04

## 2014-02-03 MED ORDER — ENOXAPARIN SODIUM 40 MG/0.4ML ~~LOC~~ SOLN: 40.0000 mg | SUBCUTANEOUS | Status: DC

## 2014-02-03 MED ORDER — SODIUM CHLORIDE 0.9 % IJ SOLN
3.0000 mL | Freq: Two times a day (BID) | INTRAMUSCULAR | Status: DC
  Administered 2014-02-04: 3 mL via INTRAVENOUS

## 2014-02-03 MED ORDER — SODIUM CHLORIDE 0.9 % IJ SOLN: 3.0000 mL | INTRAMUSCULAR | Status: DC | PRN

## 2014-02-03 MED ORDER — ATORVASTATIN CALCIUM 20 MG PO TABS
20.0000 mg | ORAL_TABLET | Freq: Every day | ORAL | Status: DC
  Administered 2014-02-03 – 2014-02-04 (×2): 20 mg via ORAL
  Filled 2014-02-03 (×2): qty 1

## 2014-02-03 MED ORDER — DOCUSATE SODIUM 100 MG PO CAPS
100.0000 mg | ORAL_CAPSULE | Freq: Two times a day (BID) | ORAL | Status: DC
  Administered 2014-02-04: 100 mg via ORAL
  Filled 2014-02-03 (×3): qty 1

## 2014-02-03 NOTE — ED Notes (Signed)
Pt refusing morphine.

## 2014-02-03 NOTE — H&P (Signed)
[facility]                                                                                    Patient Demographics  Emily Wu, is a 80 y.o. female  MRN: [id number]   DOB - 1931/04/30  Admit Date - 02/03/2014  Outpatient Primary MD for the patient is Deborra Medina, MD   With History of -  Past Medical History  Diagnosis Date  . Hypertension   . Cancer 1965    cervical vs endometrial      Past Surgical History  Procedure Laterality Date  . Left knee      arthroscopy ,  [facility]  . Rotator cuff repair  2007,  2010    bilateral,  [facility]  . Abdominal hysterectomy  1965  . Appendectomy    . Cholecystectomy    . Shoulder surgery Bilateral   . Throat surgery      in for   Chief Complaint  Patient presents with  . Chest Pain     HPI  Emily Wu  is a 80 y.o. female, with a past medical history of endometrial cancer, hyperlipidemia, hypertension, cholecystectomy, multiple orthopedic Surgeries and peptic ulcer disease.  She presents to the emergency department with the complaint of gassy epigastric pressure that started last night at approximately midnight.  She denies vomiting, anorexia, or recent weight loss. She describes this as epigastric pressure with excessive gassiness and burping rather than chest pain. She does have a history of peptic ulcer disease related to NSAIDs (Vioxx) that was treated by Dr. Earlean Shawl.  She denies chest pain, shortness of breath, orthopnea, and dyspnea on exertion.  She has no previous heart history. Her parents both suffered strokes. While she denies NSAIDs her med rec lists meloxicam.  Further she mentions she has difficulty with chronic constipation, and that she was suffering with URI symptoms (cough and congestion) on Wednesday and Thursday after having a pneumonia shot on Monday.  Review of Systems    In addition to the HPI above,  No Fever-chills, No Headache, No changes with Vision or hearing, No problems swallowing food or  Liquids, No Chest pain, Cough or Shortness of Breath, No Nausea or Vomiting, Bowel movements are slow and difficult  No Blood in stool or Urine, No dysuria, No new skin rashes or bruises, No new joints pains-aches,  No new weakness, tingling, numbness in any extremity, No recent weight gain or loss, No polyuria, polydypsia or polyphagia, No significant Mental Stressors.  A full 10 point Review of Systems was done, except as stated above, all other Review of Systems were negative.   Social History History  Substance Use Topics  . Smoking status: Former Research scientist (life sciences)  . Smokeless tobacco: Never Used  . Alcohol Use: No     Family History Family History  Problem Relation Age of Onset  . Peripheral vascular disease Mother   . Stroke Father   . Hyperlipidemia Father   . Heart disease Father 71  . Cancer Neg Hx      Prior to Admission medications   Medication Sig Start Date End Date Taking? Authorizing Provider  amitriptyline (ELAVIL) 25 MG tablet Take  1 tablet (25 mg total) by mouth daily. 01/19/14  Yes Crecencio Mc, MD  atorvastatin (LIPITOR) 20 MG tablet Take 1 tablet (20 mg total) by mouth daily. 01/19/14  Yes Crecencio Mc, MD  bifidobacterium infantis (ALIGN) capsule Take 1 capsule by mouth daily.   Yes Historical Provider, MD  estrogens, conjugated, (PREMARIN) 0.3 MG tablet Take 0.3 mg by mouth daily.   Yes Historical Provider, MD  ezetimibe-simvastatin (VYTORIN) 10-20 MG per tablet Take 1 tablet by mouth daily.   Yes Historical Provider, MD  meloxicam (MOBIC) 15 MG tablet Take 1 tablet by mouth daily. 01/03/14  Yes Historical Provider, MD  Multiple Vitamin (MULTIVITAMIN) tablet Take 1 tablet by mouth daily.   Yes Historical Provider, MD    Allergies  Allergen Reactions  . Tramadol Anaphylaxis  . Amoxicillin Rash  . Promethazine Hcl Rash  . Sulfa Drugs Cross Reactors Rash    Physical Exam  Vitals  Blood pressure 106/60, pulse 63, temperature 97.4 F (36.3 C), resp.  rate 23, height 5\' 2"  (1.575 m), weight 58.06 kg (128 lb), SpO2 100.00%.   General:  lying in bed in NAD,   Psych:  Normal affect and insight, Not Suicidal or Homicidal, Awake Alert, Oriented X 3.  Neuro:   No F.N deficits, ALL C.Nerves Intact, Strength 5/5 all 4 extremities, Sensation intact all 4 extremities, Plantars down going.  ENT:  Ears and Eyes appear Normal, Conjunctivae clear, PERRLA. Moist Oral Mucosa.  Neck:  Supple Neck, No JVD, No cervical lymphadenopathy appriciated, No Carotid Bruits.  Respiratory:  Symmetrical Chest wall movement, Good air movement bilaterally, CTAB.  Cardiac:  RRR, No Gallops, Rubs or Murmurs, No Parasternal Heave.  Abdomen:  Positive Bowel Sounds, Abdomen Soft, minimally tender to deep palpation in the epigastric area, No organomegaly appriciated  Skin:  No Cyanosis, Normal Skin Turgor, No Skin Rash or Bruise.  Extremities:  Good muscle tone,  joints appear normal , no effusions, Normal ROM.   Data Review  CBC  Recent Labs Lab 02/03/14 1212  WBC 6.5  HGB 13.7  HCT 39.3  PLT 203  MCV 91.8  MCH 32.0  MCHC 34.9  RDW 13.2   ------------------------------------------------------------------------------------------------------------------  Chemistries   Recent Labs Lab 02/03/14 1212  NA 144  K 4.3  CL 106  CO2 27  GLUCOSE 88  BUN 14  CREATININE 0.99  CALCIUM 9.4   -------------------------------------------------------------------------------------------------------------  Urinalysis    Component Value Date/Time   COLORURINE YELLOW 02/13/2011 1413   APPEARANCEUR HAZY* 02/13/2011 1413   LABSPEC 1.010 02/13/2011 1413   PHURINE 6.0 02/13/2011 [place] 02/13/2011 [place] 02/13/2011 [place] 02/13/2011 1413   [place] 02/13/2011 1413   PROTEINUR NEGATIVE 02/13/2011 1413   UROBILINOGEN 0.2 02/13/2011 1413   NITRITE NEGATIVE 02/13/2011 1413   LEUKOCYTESUR TRACE* 02/13/2011  1413    ----------------------------------------------------------------------------------------------------------------  Imaging results:   Dg Chest 2 View  02/03/2014   CLINICAL DATA:  Left lower chest pain.  Cough.  EXAM: CHEST  2 VIEW  COMPARISON:  DG CHEST 2 VIEW dated 07/06/2012; DG CHEST 2 VIEW dated 01/29/2011  FINDINGS: The heart size and mediastinal contours are within normal limits. Both lungs are clear. The visualized skeletal structures are unremarkable.  IMPRESSION: No active cardiopulmonary disease.   Electronically Signed   By: Sherryl Barters M.D.   On: 02/03/2014 12:52    My personal review of EKG: Rhythm NSR     Assessment &  Plan  Active Problems:   Chest pain   Dyspepsia   Hyperlipidemia   Chest pain I believe this is more GI-related than cardiac. Will admit under observation to cardiac telemetry for chest pain rule out. Will cycle cardiac enzymes, and order when necessary sublingual nitroglycerin Will discontinue Mobic, and order IV Protonix and GI cocktail when necessary. Check serum lipase. She is status post cholecystectomy. I recommend she see Dr. Earlean Shawl soon after discharge for upper endoscopy.  Dyspepsia Possibly related to peptic ulcer recurrence given the usage of meloxicam  Please see above  Hyperlipidemia Continue Vytorin    DVT Prophylaxis  Lovenox   AM Labs Ordered, also please review Full Orders  Family Communication:   Husband at bedside   Code Status:  Full  Likely DC to  home in a.m. Condition:  Stable  Time spent in minutes : 60    York, Bobby Rumpf PA-C on 02/03/2014 at 4:12 PM  Between 7am to 7pm - Pager - [phone]  After 7pm go to [email or web address] - password [email or web address]  And look for the night coverage person covering me after hours  [facility] Office  [phone]  Attending Patient seen and examined, agree with the assessment and plan as outlined above. Admitted with chest pain-suspect atypical. However  will monitor in telemetry and cycle troponins. Will continue to follow, rest as above  Nena Alexander MD

## 2014-02-03 NOTE — ED Notes (Signed)
Pt presents with left sided cp radiating to her back since 1215 this morning. States she has had a lot of gas and belching and felt SOB with it. It woke her up.

## 2014-02-03 NOTE — ED Provider Notes (Addendum)
CSN: [id number]     Arrival date & time 02/03/14  1145 History   First MD Initiated Contact with Patient 02/03/14 1239     Chief Complaint  Patient presents with  . Chest Pain     (Consider location/radiation/quality/duration/timing/severity/associated sxs/prior Treatment) HPI Complains of anterior chest pain radiating to the back onset 12:15 AM today. As described as "gassy'and heavy. She treated self with 2 baby aspirin and Prevacid with minimal relief. She states that pain is intermittent lasting a few hours at a time. Not made better or worse by anything. No other associated symptoms Presently discomfort is minimal. No associated shortness of breath nausea or sweatiness. Cardiac risk factors ex-smoker quit 50 years ago. Hypercholesterolemia Past Medical History  Diagnosis Date  . Hypertension   . Cancer 1965    cervical vs endometrial   Past Surgical History  Procedure Laterality Date  . Left knee      arthroscopy ,  [facility]  . Rotator cuff repair  2007,  2010    bilateral,  [facility]  . Abdominal hysterectomy  1965  . Appendectomy    . Cholecystectomy    . Shoulder surgery Bilateral   . Throat surgery     Family History  Problem Relation Age of Onset  . Peripheral vascular disease Mother   . Stroke Father   . Hyperlipidemia Father   . Heart disease Father 26  . Cancer Neg Hx    History  Substance Use Topics  . Smoking status: Former Research scientist (life sciences)  . Smokeless tobacco: Never Used  . Alcohol Use: No   OB History   Grav Para Term Preterm Abortions TAB SAB Ect Mult Living                 Review of Systems  Constitutional: Negative.   HENT: Negative.   Respiratory: Negative.   Cardiovascular: Positive for chest pain.  Gastrointestinal: Negative.   Musculoskeletal: Negative.   Skin: Negative.   Neurological: Negative.   Psychiatric/Behavioral: Negative.   All other systems reviewed and are negative.      Allergies  Tramadol; Amoxicillin; Promethazine hcl; and Sulfa  drugs cross reactors  Home Medications   Current Outpatient Rx  Name  Route  Sig  Dispense  Refill  . amitriptyline (ELAVIL) 25 MG tablet   Oral   Take 1 tablet (25 mg total) by mouth daily.   90 tablet   1   . atorvastatin (LIPITOR) 20 MG tablet   Oral   Take 1 tablet (20 mg total) by mouth daily.   90 tablet   1   . bifidobacterium infantis (ALIGN) capsule   Oral   Take 1 capsule by mouth daily.         Marland Kitchen estrogens, conjugated, (PREMARIN) 0.3 MG tablet   Oral   Take 0.3 mg by mouth daily.         Marland Kitchen ezetimibe-simvastatin (VYTORIN) 10-20 MG per tablet   Oral   Take 1 tablet by mouth daily.         . meloxicam (MOBIC) 15 MG tablet   Oral   Take 1 tablet by mouth daily.         . Multiple Vitamin (MULTIVITAMIN) tablet   Oral   Take 1 tablet by mouth daily.          BP 112/54  Pulse 63  Temp(Src) 97.4 F (36.3 C)  Resp 23  Ht 5\' 2"  (1.575 m)  Wt 128 lb (58.06 kg)  BMI 23.41  kg/m2  SpO2 97% Physical Exam  Nursing note and vitals reviewed. Constitutional: She appears well-developed and well-nourished.  HENT:  Head: Normocephalic and atraumatic.  Eyes: Conjunctivae are normal. Pupils are equal, round, and reactive to light.  Neck: Neck supple. No tracheal deviation present. No thyromegaly present.  Cardiovascular: Normal rate and regular rhythm.   No murmur heard. Pulmonary/Chest: Effort normal and breath sounds normal.  Abdominal: Soft. Bowel sounds are normal. She exhibits no distension. There is no tenderness.  Musculoskeletal: Normal range of motion. She exhibits no edema and no tenderness.  Neurological: She is alert. Coordination normal.  Skin: Skin is warm and dry. No rash noted.  Psychiatric: She has a normal mood and affect.    ED Course  Procedures (including critical care time) Labs Review Labs Reviewed  BASIC METABOLIC PANEL - Abnormal; Notable for the following:    GFR calc non Af Amer 52 (*)    GFR calc Af Amer 61 (*)    All  other components within normal limits  CBC  I-STAT TROPOININ, ED   Imaging Review Dg Chest 2 View  02/03/2014   CLINICAL DATA:  Left lower chest pain.  Cough.  EXAM: CHEST  2 VIEW  COMPARISON:  DG CHEST 2 VIEW dated 07/06/2012; DG CHEST 2 VIEW dated 01/29/2011  FINDINGS: The heart size and mediastinal contours are within normal limits. Both lungs are clear. The visualized skeletal structures are unremarkable.  IMPRESSION: No active cardiopulmonary disease.   Electronically Signed   By: Sherryl Barters M.D.   On: 02/03/2014 12:52     EKG Interpretation   Date/Time:  Friday February 03 2014 11:50:22 EDT Ventricular Rate:  74 PR Interval:  122 QRS Duration: 66 QT Interval:  380 QTC Calculation: 421 R Axis:   -34 Text Interpretation:  Normal sinus rhythm Left axis deviation Anteroseptal  infarct , age undetermined Abnormal ECG No significant change since last  tracing Confirmed by Winfred Leeds  MD, Kanai Hilger [phone]) on 02/03/2014 12:54:58 PM     Chest xray viewed by me Results for orders placed during the hospital encounter of 02/03/14  CBC      Result Value Ref Range   WBC 6.5  4.0 - 10.5 K/uL   RBC 4.28  3.87 - 5.11 MIL/uL   Hemoglobin 13.7  12.0 - 15.0 g/dL   HCT 39.3  36.0 - 46.0 %   MCV 91.8  78.0 - 100.0 fL   MCH 32.0  26.0 - 34.0 pg   MCHC 34.9  30.0 - 36.0 g/dL   RDW 13.2  11.5 - 15.5 %   Platelets 203  150 - 400 K/uL  BASIC METABOLIC PANEL      Result Value Ref Range   Sodium 144  137 - 147 mEq/L   Potassium 4.3  3.7 - 5.3 mEq/L   Chloride 106  96 - 112 mEq/L   CO2 27  19 - 32 mEq/L   Glucose, Bld 88  70 - 99 mg/dL   BUN 14  6 - 23 mg/dL   Creatinine, Ser 0.99  0.50 - 1.10 mg/dL   Calcium 9.4  8.4 - 10.5 mg/dL   GFR calc non Af Amer 52 (*) >90 mL/min   GFR calc Af Amer 61 (*) >90 mL/min  I-STAT TROPOININ, ED      Result Value Ref Range   Troponin i, poc 0.01  0.00 - 0.08 ng/mL   Comment 3  Dg Chest 2 View  02/03/2014   CLINICAL DATA:  Left lower chest pain.   Cough.  EXAM: CHEST  2 VIEW  COMPARISON:  DG CHEST 2 VIEW dated 07/06/2012; DG CHEST 2 VIEW dated 01/29/2011  FINDINGS: The heart size and mediastinal contours are within normal limits. Both lungs are clear. The visualized skeletal structures are unremarkable.  IMPRESSION: No active cardiopulmonary disease.   Electronically Signed   By: Sherryl Barters M.D.   On: 02/03/2014 12:52    Patient receive no pain relief to treat it with 3 sublingual nitroglycerin and baby aspirin. She refused morphine, stating "I don't do with pain medicine" MDM  Heart score =4 Final diagnoses:  None   My clinical suspicion of acute coronary syndrome is low and I feel the patient can be be placed to telemetry bed. I spoke with [facility] plan telemetry, 23 observation. Patient will wire further definitive testing such as cardiac CT scan or stress test to stratify for acute coronary syndrome and risk of ACS Dx : chest pain    Orlie Dakin, MD 02/03/14 1455 Correction. Patient will be follwed by to hospitalist service under Dr.Ghimire  Orlie Dakin, MD 02/03/14 [place], MD 02/16/14 1158

## 2014-02-03 NOTE — ED Notes (Signed)
Pt returned from xray

## 2014-02-04 DIAGNOSIS — R079 Chest pain, unspecified: Secondary | ICD-10-CM

## 2014-02-04 DIAGNOSIS — E785 Hyperlipidemia, unspecified: Secondary | ICD-10-CM

## 2014-02-04 DIAGNOSIS — R1013 Epigastric pain: Secondary | ICD-10-CM

## 2014-02-04 DIAGNOSIS — Z8719 Personal history of other diseases of the digestive system: Secondary | ICD-10-CM | POA: Diagnosis not present

## 2014-02-04 DIAGNOSIS — K3189 Other diseases of stomach and duodenum: Secondary | ICD-10-CM

## 2014-02-04 LAB — TROPONIN I
Troponin I: <0.3 ng/mL (ref <0.30)
Troponin I: <0.3 ng/mL (ref <0.30)

## 2014-02-04 MED ORDER — PANTOPRAZOLE SODIUM 40 MG PO TBEC: 40.0000 mg | DELAYED_RELEASE_TABLET | Freq: Every day | ORAL | Status: DC

## 2014-02-04 NOTE — Discharge Summary (Signed)
Physician Discharge Summary  Emily Wu NOB:[id number] DOB: [birth year] DOA: 02/03/2014  PCP: Deborra Medina, MD  Admit date: 02/03/2014 Discharge date: 02/04/2014  Recommendations for Outpatient Follow-up:  1. Your heart enzymes were all within normal limits. Please follow up with your PCP in 1-2 weeks after discharge to make sure symptoms are stable and controlled.  Discharge Diagnoses:  Active Problems:   Chest pain   Dyspepsia   Hyperlipidemia    Discharge Condition: medically stable for discharge home today  Diet recommendation: as tolerated  History of present illness:  80 y.o. female, with a past medical history of endometrial cancer, hyperlipidemia, hypertension, cholecystectomy, multiple orthopedic surgeries and peptic ulcer disease who presented to [facility] ED 02/03/2014 with epigastric pain started on the midnight prior to this admission. Pt reported pain may have radiated to the chest but initial pain was in epigastric region. No associated abd pain, nausea or vomiting. No fever or chills. In ED< vitals were stable. Cardiac enzymes WNL and no acute findings on 12 lead EKG.   Hospital Course:   Active Problems:   Chest pain - musculoskeletal versus GI etiology. - pain is resolved at this time  - cardiac enzymes are WNL - no acute ischemic changes on 12 lead EKG - will prescribe protonix on discharge  - pt stable for discharge today and may resume home medications.  Dyslipidemia - continue statin therapy  Signed:  Leisa Lenz, MD  [facility] 02/04/2014, 12:27 PM  Pager #: [phone]  Procedures:  None   Consultations:  None   Discharge Exam: Filed Vitals:   02/04/14 0520  BP: 107/46  Pulse: 64  Temp: 97.8 F (36.6 C)  Resp: 18   Filed Vitals:   02/03/14 1527 02/03/14 1645 02/03/14 2209 02/04/14 0520  BP: 106/60 124/58 106/34 107/46  Pulse: 63 63 69 64  Temp:  97.5 F (36.4 C) 97.7 F (36.5 C) 97.8 F (36.6 C)  TempSrc:  Oral Oral  Oral  Resp: 23 21 20 18   Height:      Weight:      SpO2: 100% 97% 95% 97%    General: Pt is alert, follows commands appropriately, not in acute distress Cardiovascular: Regular rate and rhythm, S1/S2 +, no murmurs, no rubs, no gallops Respiratory: Clear to auscultation bilaterally, no wheezing, no crackles, no rhonchi Abdominal: Soft, non tender, non distended, bowel sounds +, no guarding Extremities: no edema, no cyanosis, pulses palpable bilaterally DP and PT Neuro: Grossly nonfocal  Discharge Instructions  Discharge Orders   Future Orders Complete By Expires   Call MD for:  difficulty breathing, headache or visual disturbances  As directed    Call MD for:  persistant dizziness or light-headedness  As directed    Call MD for:  persistant nausea and vomiting  As directed    Call MD for:  severe uncontrolled pain  As directed    Diet - low sodium heart healthy  As directed    Discharge instructions  As directed    Comments:     1. Your heart enzymes were all within normal limits. Please follow up with your PCP in 1-2 weeks after discharge to make sure symptoms are stable and controlled.   Increase activity slowly  As directed        Medication List         amitriptyline 25 MG tablet  Commonly known as:  ELAVIL  Take 1 tablet (25 mg total) by mouth daily.     atorvastatin 20  MG tablet  Commonly known as:  LIPITOR  Take 1 tablet (20 mg total) by mouth daily.     bifidobacterium infantis capsule  Take 1 capsule by mouth daily.     estrogens (conjugated) 0.3 MG tablet  Commonly known as:  PREMARIN  Take 0.3 mg by mouth daily.     meloxicam 15 MG tablet  Commonly known as:  MOBIC  Take 1 tablet by mouth daily.     multivitamin tablet  Take 1 tablet by mouth daily.     pantoprazole 40 MG tablet  Commonly known as:  PROTONIX  Take 1 tablet (40 mg total) by mouth daily.            Follow-up Information   Follow up with Deborra Medina, MD. Schedule an appointment  as soon as possible for a visit in 1 week.   Specialty:  Internal Medicine   Contact information:   [address] [place] [address] [phone]        The results of significant diagnostics from this hospitalization (including imaging, microbiology, ancillary and laboratory) are listed below for reference.    Significant Diagnostic Studies: Dg Chest 2 View  02/03/2014   CLINICAL DATA:  Left lower chest pain.  Cough.  EXAM: CHEST  2 VIEW  COMPARISON:  DG CHEST 2 VIEW dated 07/06/2012; DG CHEST 2 VIEW dated 01/29/2011  FINDINGS: The heart size and mediastinal contours are within normal limits. Both lungs are clear. The visualized skeletal structures are unremarkable.  IMPRESSION: No active cardiopulmonary disease.   Electronically Signed   By: Sherryl Barters M.D.   On: 02/03/2014 12:52    Microbiology: No results found for this or any previous visit (from the past 240 hour(s)).   Labs: Basic Metabolic Panel:  Recent Labs Lab 02/03/14 1212 02/03/14 1820  NA 144  --   K 4.3  --   CL 106  --   CO2 27  --   GLUCOSE 88  --   BUN 14  --   CREATININE 0.99 0.94  CALCIUM 9.4  --    Liver Function Tests: No results found for this basename: AST, ALT, ALKPHOS, BILITOT, PROT, ALBUMIN,  in the last 168 hours  Recent Labs Lab 02/03/14 1820  LIPASE 18   No results found for this basename: AMMONIA,  in the last 168 hours CBC:  Recent Labs Lab 02/03/14 1212  WBC 6.5  HGB 13.7  HCT 39.3  MCV 91.8  PLT 203   Cardiac Enzymes:  Recent Labs Lab 02/03/14 1820 02/04/14 0200 02/04/14 0700  TROPONINI <0.30 <0.30 <0.30   BNP: BNP (last 3 results) No results found for this basename: PROBNP,  in the last 8760 hours CBG: No results found for this basename: GLUCAP,  in the last 168 hours  Time coordinating discharge: Over 30 minutes

## 2014-02-04 NOTE — Progress Notes (Signed)
Utilization review completed.  P.J. Decarlo Rivet,RN,BSN Case Manager [phone]  

## 2014-02-04 NOTE — Discharge Instructions (Signed)
Chest Pain (Nonspecific) °It is often hard to give a specific diagnosis for the cause of chest pain. There is always a chance that your pain could be related to something serious, such as a heart attack or a blood clot in the lungs. You need to follow up with your caregiver for further evaluation. °CAUSES  °· Heartburn. °· Pneumonia or bronchitis. °· Anxiety or stress. °· Inflammation around your heart (pericarditis) or lung (pleuritis or pleurisy). °· A blood clot in the lung. °· A collapsed lung (pneumothorax). It can develop suddenly on its own (spontaneous pneumothorax) or from injury (trauma) to the chest. °· Shingles infection (herpes zoster virus). °The chest wall is composed of bones, muscles, and cartilage. Any of these can be the source of the pain. °· The bones can be bruised by injury. °· The muscles or cartilage can be strained by coughing or overwork. °· The cartilage can be affected by inflammation and become sore (costochondritis). °DIAGNOSIS  °Lab tests or other studies, such as X-rays, electrocardiography, stress testing, or cardiac imaging, may be needed to find the cause of your pain.  °TREATMENT  °· Treatment depends on what may be causing your chest pain. Treatment may include: °· Acid blockers for heartburn. °· Anti-inflammatory medicine. °· Pain medicine for inflammatory conditions. °· Antibiotics if an infection is present. °· You may be advised to change lifestyle habits. This includes stopping smoking and avoiding alcohol, caffeine, and chocolate. °· You may be advised to keep your head raised (elevated) when sleeping. This reduces the chance of acid going backward from your stomach into your esophagus. °· Most of the time, nonspecific chest pain will improve within 2 to 3 days with rest and mild pain medicine. °HOME CARE INSTRUCTIONS  °· If antibiotics were prescribed, take your antibiotics as directed. Finish them even if you start to feel better. °· For the next few days, avoid physical  activities that bring on chest pain. Continue physical activities as directed. °· Do not smoke. °· Avoid drinking alcohol. °· Only take over-the-counter or prescription medicine for pain, discomfort, or fever as directed by your caregiver. °· Follow your caregiver's suggestions for further testing if your chest pain does not go away. °· Keep any follow-up appointments you made. If you do not go to an appointment, you could develop lasting (chronic) problems with pain. If there is any problem keeping an appointment, you must call to reschedule. °SEEK MEDICAL CARE IF:  °· You think you are having problems from the medicine you are taking. Read your medicine instructions carefully. °· Your chest pain does not go away, even after treatment. °· You develop a rash with blisters on your chest. °SEEK IMMEDIATE MEDICAL CARE IF:  °· You have increased chest pain or pain that spreads to your arm, neck, jaw, back, or abdomen. °· You develop shortness of breath, an increasing cough, or you are coughing up blood. °· You have severe back or abdominal pain, feel nauseous, or vomit. °· You develop severe weakness, fainting, or chills. °· You have a fever. °THIS IS AN EMERGENCY. Do not wait to see if the pain will go away. Get medical help at once. Call your local emergency services (911 in [place] Do not drive yourself to the hospital. °MAKE SURE YOU:  °· Understand these instructions. °· Will watch your condition. °· Will get help right away if you are not doing well or get worse. °Document Released: 08/20/2005 Document Revised: 02/02/2012 Document Reviewed: 06/15/2008 °ExitCare® Patient Information ©2014 [employer]. ° °

## 2014-02-15 DIAGNOSIS — R143 Flatulence: Secondary | ICD-10-CM | POA: Diagnosis not present

## 2014-02-15 DIAGNOSIS — R141 Gas pain: Secondary | ICD-10-CM | POA: Diagnosis not present

## 2014-02-15 DIAGNOSIS — K219 Gastro-esophageal reflux disease without esophagitis: Secondary | ICD-10-CM | POA: Diagnosis not present

## 2014-03-06 ENCOUNTER — Other Ambulatory Visit: Payer: Self-pay | Admitting: Internal Medicine

## 2014-03-09 DIAGNOSIS — H52229 Regular astigmatism, unspecified eye: Secondary | ICD-10-CM | POA: Diagnosis not present

## 2014-03-09 DIAGNOSIS — H5200 Hypermetropia, unspecified eye: Secondary | ICD-10-CM | POA: Diagnosis not present

## 2014-03-09 DIAGNOSIS — H524 Presbyopia: Secondary | ICD-10-CM | POA: Diagnosis not present

## 2014-03-09 DIAGNOSIS — H2589 Other age-related cataract: Secondary | ICD-10-CM | POA: Diagnosis not present

## 2014-04-05 DIAGNOSIS — Z978 Presence of other specified devices: Secondary | ICD-10-CM | POA: Diagnosis not present

## 2014-04-05 DIAGNOSIS — Z1231 Encounter for screening mammogram for malignant neoplasm of breast: Secondary | ICD-10-CM | POA: Diagnosis not present

## 2014-04-18 ENCOUNTER — Other Ambulatory Visit: Payer: Self-pay | Admitting: Internal Medicine

## 2014-04-18 DIAGNOSIS — K219 Gastro-esophageal reflux disease without esophagitis: Secondary | ICD-10-CM

## 2014-04-18 NOTE — Telephone Encounter (Signed)
Refill for protonix sent to [facility]

## 2014-04-19 DIAGNOSIS — R152 Fecal urgency: Secondary | ICD-10-CM | POA: Diagnosis not present

## 2014-04-25 DIAGNOSIS — K3189 Other diseases of stomach and duodenum: Secondary | ICD-10-CM | POA: Diagnosis not present

## 2014-04-26 ENCOUNTER — Encounter: Payer: Self-pay | Admitting: Internal Medicine

## 2014-05-04 DIAGNOSIS — L821 Other seborrheic keratosis: Secondary | ICD-10-CM | POA: Diagnosis not present

## 2014-05-04 DIAGNOSIS — L659 Nonscarring hair loss, unspecified: Secondary | ICD-10-CM | POA: Diagnosis not present

## 2014-06-22 ENCOUNTER — Other Ambulatory Visit: Payer: Self-pay | Admitting: Internal Medicine

## 2014-11-03 DIAGNOSIS — L658 Other specified nonscarring hair loss: Secondary | ICD-10-CM | POA: Diagnosis not present

## 2014-11-03 DIAGNOSIS — D485 Neoplasm of uncertain behavior of skin: Secondary | ICD-10-CM | POA: Diagnosis not present

## 2014-11-03 DIAGNOSIS — L821 Other seborrheic keratosis: Secondary | ICD-10-CM | POA: Diagnosis not present

## 2014-12-18 DIAGNOSIS — H1131 Conjunctival hemorrhage, right eye: Secondary | ICD-10-CM | POA: Diagnosis not present

## 2014-12-18 DIAGNOSIS — H25813 Combined forms of age-related cataract, bilateral: Secondary | ICD-10-CM | POA: Diagnosis not present

## 2014-12-18 DIAGNOSIS — H04123 Dry eye syndrome of bilateral lacrimal glands: Secondary | ICD-10-CM | POA: Diagnosis not present

## 2014-12-23 ENCOUNTER — Other Ambulatory Visit: Payer: Self-pay | Admitting: Internal Medicine

## 2014-12-25 NOTE — Telephone Encounter (Signed)
Appt 01/09/15

## 2015-01-09 ENCOUNTER — Encounter (INDEPENDENT_AMBULATORY_CARE_PROVIDER_SITE_OTHER): Payer: Self-pay

## 2015-01-09 ENCOUNTER — Encounter: Payer: Self-pay | Admitting: Internal Medicine

## 2015-01-09 ENCOUNTER — Ambulatory Visit (INDEPENDENT_AMBULATORY_CARE_PROVIDER_SITE_OTHER): Payer: Medicare Other | Admitting: Internal Medicine

## 2015-01-09 VITALS — BP 128/60 | HR 97 | Temp 98.4°F | Resp 16 | Ht 63.0 in | Wt 133.5 lb

## 2015-01-09 DIAGNOSIS — Z23 Encounter for immunization: Secondary | ICD-10-CM

## 2015-01-09 DIAGNOSIS — Z79899 Other long term (current) drug therapy: Secondary | ICD-10-CM

## 2015-01-09 DIAGNOSIS — E559 Vitamin D deficiency, unspecified: Secondary | ICD-10-CM

## 2015-01-09 DIAGNOSIS — A047 Enterocolitis due to Clostridium difficile: Secondary | ICD-10-CM

## 2015-01-09 DIAGNOSIS — K2100 Gastro-esophageal reflux disease with esophagitis, without bleeding: Secondary | ICD-10-CM

## 2015-01-09 DIAGNOSIS — E785 Hyperlipidemia, unspecified: Secondary | ICD-10-CM | POA: Diagnosis not present

## 2015-01-09 DIAGNOSIS — R5383 Other fatigue: Secondary | ICD-10-CM

## 2015-01-09 DIAGNOSIS — K635 Polyp of colon: Secondary | ICD-10-CM

## 2015-01-09 DIAGNOSIS — Z0000 Encounter for general adult medical examination without abnormal findings: Secondary | ICD-10-CM

## 2015-01-09 DIAGNOSIS — Z13820 Encounter for screening for osteoporosis: Secondary | ICD-10-CM

## 2015-01-09 DIAGNOSIS — A0471 Enterocolitis due to Clostridium difficile, recurrent: Secondary | ICD-10-CM

## 2015-01-09 DIAGNOSIS — Z8541 Personal history of malignant neoplasm of cervix uteri: Secondary | ICD-10-CM

## 2015-01-09 DIAGNOSIS — R079 Chest pain, unspecified: Secondary | ICD-10-CM

## 2015-01-09 MED ORDER — PANTOPRAZOLE SODIUM 40 MG PO TBEC: 40.0000 mg | DELAYED_RELEASE_TABLET | Freq: Every day | ORAL | Status: DC

## 2015-01-09 MED ORDER — ALIGN PO CAPS: 1.0000 | ORAL_CAPSULE | Freq: Every day | ORAL | Status: DC

## 2015-01-09 MED ORDER — ESTROGENS CONJUGATED 0.3 MG PO TABS: 0.3000 mg | ORAL_TABLET | Freq: Every day | ORAL | Status: DC

## 2015-01-09 MED ORDER — MELOXICAM 15 MG PO TABS: 15.0000 mg | ORAL_TABLET | Freq: Every day | ORAL | Status: DC

## 2015-01-09 MED ORDER — ATORVASTATIN CALCIUM 20 MG PO TABS: 20.0000 mg | ORAL_TABLET | Freq: Every day | ORAL | Status: DC

## 2015-01-09 MED ORDER — AMITRIPTYLINE HCL 25 MG PO TABS: 25.0000 mg | ORAL_TABLET | Freq: Every day | ORAL | Status: DC

## 2015-01-09 NOTE — Progress Notes (Signed)
Pre-visit discussion using our clinic review tool. No additional management support is needed unless otherwise documented below in the visit note.  

## 2015-01-09 NOTE — Patient Instructions (Signed)

## 2015-01-09 NOTE — Progress Notes (Signed)
Patient ID: Emily Wu, female   DOB: July 18, 1931, 81 y.o.   MRN: [id number] The patient is here for annual Medicare wellness examination and management of other chronic and acute problems.  She was last seen a year ago.    Cc    1) Her stools have been intermitently alternating between no stools for 2 days, then soft or liquid stools, never more than once per day  No fevers, or abd pain or unintentional wt loss.  Taking align daily  2) taking lipitor  Reguarly,  Denies muscle pain ,  Has not had labs in a year. 3) Back pain : occasional sciatica symptoms but most of the time just has morning stiffness upon waking that improves with ambulation.  Uses meloxicam almost daily.  5) Hot flashes:  Still has occasional flashes,  Taking premarin since her hysterectomy.  Risks of continuing medication given her age were reviewed again today, she has no desire to stop the medication . 6) GERD:  Symptoms are controlled as long as she continues daily protonix,      The risk factors are reflected in the social history.  The roster of all physicians providing medical care to patient - is listed in the Snapshot section of the chart.  Activities of daily living:  The patient is 100% independent in all ADLs: dressing, toileting, feeding as well as independent mobility  Home safety : The patient has smoke detectors in the home. They wear seatbelts.  There are no firearms at home. There is no violence in the home.   There is no risks for hepatitis, STDs or HIV. There is no   history of blood transfusion. They have no travel history to infectious disease endemic areas of the world.  The patient has seen their dentist in the last six month. They have seen their eye doctor in the last year. They admit to slight hearing difficulty with regard to whispered voices and some television programs.  They have deferred audiologic testing in the last year.  They do not  have excessive sun exposure. Discussed the need for  sun protection: hats, long sleeves and use of sunscreen if there is significant sun exposure.   Diet: the importance of a healthy diet is discussed. They do have a healthy diet.  The benefits of regular aerobic exercise were discussed. She walks 4 times per week ,  20 minutes.   Depression screen: there are no signs or vegative symptoms of depression- irritability, change in appetite, anhedonia, sadness/tearfullness.  Cognitive assessment: the patient manages all their financial and personal affairs and is actively engaged. They could relate day,date,year and events; recalled 2/3 objects at 3 minutes; performed clock-face test normally.  The following portions of the patient's history were reviewed and updated as appropriate: allergies, current medications, past family history, past medical history,  past surgical history, past social history  and problem list.  Visual acuity was not assessed per patient preference since she has regular follow up with her ophthalmologist. Hearing and body mass index were assessed and reviewed.   During the course of the visit the patient was educated and counseled about appropriate screening and preventive services including : fall prevention , diabetes screening, nutrition counseling, colorectal cancer screening, and recommended immunizations.    Review of Systems"  Patient denies headache, fevers, malaise, unintentional weight loss, skin rash, eye pain, sinus congestion and sinus pain, sore throat, dysphagia,  hemoptysis , cough, dyspnea, wheezing, chest pain, palpitations, orthopnea, edema, abdominal pain, nausea,  melena, flank pain, dysuria, hematuria, urinary  Frequency, nocturia, numbness, tingling, seizures,  Focal weakness, Loss of consciousness,  Tremor, insomnia, depression, anxiety, and suicidal ideation.    Objective: BP 128/60 mmHg  Pulse 97  Temp(Src) 98.4 F (36.9 C) (Oral)  Resp 16  Ht 5\' 3"  (1.6 m)  Wt 133 lb 8 oz (60.555 kg)  BMI 23.65  kg/m2  SpO2 97%  General appearance: alert, cooperative and appears stated age Head: Normocephalic, without obvious abnormality, atraumatic Eyes: conjunctivae/corneas clear. PERRL, EOM's intact. Fundi benign. Ears: normal TM's and external ear canals both ears Nose: Nares normal. Septum midline. Mucosa normal. No drainage or sinus tenderness. Throat: lips, mucosa, and tongue normal; teeth and gums normal Neck: no adenopathy, no carotid bruit, no JVD, supple, symmetrical, trachea midline and thyroid not enlarged, symmetric, no tenderness/mass/nodules Lungs: clear to auscultation bilaterally Breasts: normal appearance, no masses or tenderness Heart: regular rate and rhythm, S1, S2 normal, no murmur, click, rub or gallop Abdomen: soft, non-tender; bowel sounds normal; no masses,  no organomegaly Extremities: extremities normal, atraumatic, no cyanosis or edema Pulses: 2+ and symmetric Skin: Skin color, texture, turgor normal. No rashes or lesions Neurologic: Alert and oriented X 3, normal strength and tone. Normal symmetric reflexes. Normal coordination and gait.   Assessment and Plan:  Problem List Items Addressed This Visit    Screening for osteoporosis    DEXA has been ordered.       Recurrent colitis due to Clostridium difficile    2 epsiodes in total.  Currently patient has an irregular bowel habit with no stools for 2 to 3 days ,  Then  Has a formed, soft or sometimes   liquid stool, not more than one daily   This has been occurring chronically, and she is concerned about recurrence of C. dificile colitis .  She denies wt loss, fevers and abdominal pain .  Discussed the more likely diagnosis of IBS following C dificile colitis.  Advised her to continue daily probiotics for life and discuss with dr Winfield Cunas.  Alos advised her to suspend amitrtipyline for a week or two as this can cause constipation  .      Hyperlipidemia with target LDL less than 100    Reminded patient that she needs  to have LFTs checked every 6 months to continue current meds. She is overdue for labs.  Lab Results  Component Value Date   ALT 19 01/18/2014   AST 25 01/18/2014   ALKPHOS 58 01/18/2014   BILITOT 0.6 01/18/2014  \      Relevant Medications   atorvastatin (LIPITOR) tablet   Hyperlipidemia   Relevant Medications   atorvastatin (LIPITOR) tablet   History of cervical cancer    Patient states that her gynecologist advised her  at time of hysterectomy over 30 years ago that she should have an annual PAP smear until the day she dies.  She has had normal annual PAPs since then, ; we will continue PAPs every 2-3 years going forward.       Encounter for preventive health examination    Annual Medicare wellness  exam was done as well as a comprehensive physical exam and management of acute and chronic conditions .  During the course of the visit the patient was educated and counseled about appropriate screening and preventive services including : fall prevention , diabetes screening, nutrition counseling, colorectal cancer screening, and recommended immunizations.  Printed recommendations for health maintenance screenings was given.  Colon polyps    2 polyps were retrieved dyring 7 yr folllow up in 2012,  Repeat screening is advised in 2017 per Dr Earlean Shawl , barring any contraindications       Chest pain    Other Visit Diagnoses    Gastroesophageal reflux disease with esophagitis    -  Primary    Relevant Medications    pantoprazole (PROTONIX) EC tablet    Other fatigue        Relevant Orders    CBC with Differential/Platelet    Comprehensive metabolic panel    TSH    Long-term use of high-risk medication        Relevant Orders    Lipid panel    Vitamin D deficiency        Relevant Orders    Vit D  25 hydroxy (rtn osteoporosis monitoring)    Osteoporosis screening        Relevant Orders    DG Bone Density    Need for prophylactic vaccination against Streptococcus pneumoniae  (pneumococcus)        Relevant Orders    Pneumococcal polysaccharide vaccine 23-valent greater than or equal to 2yo subcutaneous/IM (Completed)

## 2015-01-10 ENCOUNTER — Encounter: Payer: Self-pay | Admitting: Internal Medicine

## 2015-01-10 DIAGNOSIS — Z01818 Encounter for other preprocedural examination: Secondary | ICD-10-CM | POA: Insufficient documentation

## 2015-01-10 DIAGNOSIS — Z8541 Personal history of malignant neoplasm of cervix uteri: Secondary | ICD-10-CM | POA: Insufficient documentation

## 2015-01-10 NOTE — Assessment & Plan Note (Signed)
Reminded patient that she needs to have LFTs checked every 6 months to continue current meds. She is overdue for labs.  Lab Results  Component Value Date   ALT 19 01/18/2014   AST 25 01/18/2014   ALKPHOS 58 01/18/2014   BILITOT 0.6 01/18/2014  \

## 2015-01-10 NOTE — Assessment & Plan Note (Signed)
2 epsiodes in total.  Currently patient has an irregular bowel habit with no stools for 2 to 3 days ,  Then  Has a formed, soft or sometimes   liquid stool, not more than one daily   This has been occurring chronically, and she is concerned about recurrence of C. dificile colitis .  She denies wt loss, fevers and abdominal pain .  Discussed the more likely diagnosis of IBS following C dificile colitis.  Advised her to continue daily probiotics for life and discuss with dr Winfield Cunas.  Alos advised her to suspend amitrtipyline for a week or two as this can cause constipation  .

## 2015-01-10 NOTE — Assessment & Plan Note (Deleted)
Patient states that her gynecoloigst tolde her at time of hysterectomy over 30 years ago that she should have an annual PAP smear until the day she dies.  She has had normal annual PAPs since then, ; we will continue PAPs every 2-3 years going forward.

## 2015-01-10 NOTE — Assessment & Plan Note (Signed)
DEXA has been ordered

## 2015-01-10 NOTE — Assessment & Plan Note (Signed)

## 2015-01-10 NOTE — Assessment & Plan Note (Addendum)
2 polyps were retrieved dyring 7 yr folllow up in 2012,  Repeat screening is advised in 2017 per Dr Earlean Shawl , barring any contraindications

## 2015-01-10 NOTE — Assessment & Plan Note (Signed)
Patient states that her gynecologist advised her  at time of hysterectomy over 30 years ago that she should have an annual PAP smear until the day she dies.  She has had normal annual PAPs since then, ; we will continue PAPs every 2-3 years going forward.

## 2015-01-29 ENCOUNTER — Other Ambulatory Visit (INDEPENDENT_AMBULATORY_CARE_PROVIDER_SITE_OTHER): Payer: Medicare Other

## 2015-01-29 DIAGNOSIS — E785 Hyperlipidemia, unspecified: Secondary | ICD-10-CM

## 2015-01-29 DIAGNOSIS — R5383 Other fatigue: Secondary | ICD-10-CM | POA: Diagnosis not present

## 2015-01-29 DIAGNOSIS — E559 Vitamin D deficiency, unspecified: Secondary | ICD-10-CM | POA: Diagnosis not present

## 2015-01-29 DIAGNOSIS — Z79899 Other long term (current) drug therapy: Secondary | ICD-10-CM

## 2015-01-29 DIAGNOSIS — E034 Atrophy of thyroid (acquired): Secondary | ICD-10-CM

## 2015-01-29 LAB — CBC WITH DIFFERENTIAL/PLATELET
Basophils Absolute: 0.1 10*3/uL (ref 0.0–0.1)
Basophils Relative: 0.9 % (ref 0.0–3.0)
Eosinophils Absolute: 0.2 10*3/uL (ref 0.0–0.7)
Eosinophils Relative: 3.7 % (ref 0.0–5.0)
HCT: 39.4 % (ref 36.0–46.0)
Hemoglobin: 13.3 g/dL (ref 12.0–15.0)
Lymphocytes Relative: 49.5 % — ABNORMAL HIGH (ref 12.0–46.0)
Lymphs Abs: 3 10*3/uL (ref 0.7–4.0)
MCHC: 33.8 g/dL (ref 30.0–36.0)
MCV: 92 fl (ref 78.0–100.0)
Monocytes Absolute: 0.6 10*3/uL (ref 0.1–1.0)
Monocytes Relative: 10.2 % (ref 3.0–12.0)
Neutro Abs: 2.2 10*3/uL (ref 1.4–7.7)
Neutrophils Relative %: 35.7 % — ABNORMAL LOW (ref 43.0–77.0)
Platelets: 256 10*3/uL (ref 150.0–400.0)
RBC: 4.29 Mil/uL (ref 3.87–5.11)
RDW: 13.8 % (ref 11.5–15.5)
WBC: 6.1 10*3/uL (ref 4.0–10.5)

## 2015-01-29 LAB — LIPID PANEL
Cholesterol: 196 mg/dL (ref 0–200)
HDL: 35.7 mg/dL — ABNORMAL LOW (ref >39.00)
NonHDL: 160.3
Total CHOL/HDL Ratio: 5
Triglycerides: 242 mg/dL — ABNORMAL HIGH (ref 0.0–149.0)
VLDL: 48.4 mg/dL — ABNORMAL HIGH (ref 0.0–40.0)

## 2015-01-29 LAB — COMPREHENSIVE METABOLIC PANEL
ALT: 17 U/L (ref 0–35)
AST: 23 U/L (ref 0–37)
Albumin: 3.9 g/dL (ref 3.5–5.2)
Alkaline Phosphatase: 63 U/L (ref 39–117)
BUN: 18 mg/dL (ref 6–23)
CO2: 29 mEq/L (ref 19–32)
Calcium: 9.3 mg/dL (ref 8.4–10.5)
Chloride: 106 mEq/L (ref 96–112)
Creatinine, Ser: 1.19 mg/dL (ref 0.40–1.20)
GFR: 46.19 mL/min — ABNORMAL LOW (ref >60.00)
Glucose, Bld: 88 mg/dL (ref 70–99)
Potassium: 4.3 mEq/L (ref 3.5–5.1)
Sodium: 141 mEq/L (ref 135–145)
Total Bilirubin: 0.5 mg/dL (ref 0.2–1.2)
Total Protein: 6.7 g/dL (ref 6.0–8.3)

## 2015-01-29 LAB — LDL CHOLESTEROL, DIRECT: Direct LDL: 107 mg/dL

## 2015-01-29 LAB — VITAMIN D 25 HYDROXY (VIT D DEFICIENCY, FRACTURES): VITD: 58.57 ng/mL (ref 30.00–100.00)

## 2015-01-29 LAB — TSH: TSH: 8.91 u[IU]/mL — ABNORMAL HIGH (ref 0.35–4.50)

## 2015-01-31 DIAGNOSIS — E039 Hypothyroidism, unspecified: Secondary | ICD-10-CM | POA: Insufficient documentation

## 2015-01-31 MED ORDER — LEVOTHYROXINE SODIUM 50 MCG PO TABS
50.0000 ug | ORAL_TABLET | Freq: Every day | ORAL | Status: DC
Start: 2015-01-31 — End: 2015-04-13

## 2015-01-31 NOTE — Addendum Note (Signed)
Addended by: Crecencio Mc on: 01/31/2015 05:37 PM   Modules accepted: Orders

## 2015-02-01 ENCOUNTER — Encounter: Payer: Self-pay | Admitting: *Deleted

## 2015-02-06 DIAGNOSIS — K641 Second degree hemorrhoids: Secondary | ICD-10-CM | POA: Diagnosis not present

## 2015-02-06 DIAGNOSIS — R151 Fecal smearing: Secondary | ICD-10-CM | POA: Diagnosis not present

## 2015-02-06 DIAGNOSIS — K5900 Constipation, unspecified: Secondary | ICD-10-CM | POA: Diagnosis not present

## 2015-02-21 DIAGNOSIS — K641 Second degree hemorrhoids: Secondary | ICD-10-CM | POA: Diagnosis not present

## 2015-03-07 DIAGNOSIS — K641 Second degree hemorrhoids: Secondary | ICD-10-CM | POA: Diagnosis not present

## 2015-03-15 ENCOUNTER — Other Ambulatory Visit (INDEPENDENT_AMBULATORY_CARE_PROVIDER_SITE_OTHER): Payer: Medicare Other

## 2015-03-15 DIAGNOSIS — E038 Other specified hypothyroidism: Secondary | ICD-10-CM

## 2015-03-15 DIAGNOSIS — E034 Atrophy of thyroid (acquired): Secondary | ICD-10-CM

## 2015-03-15 DIAGNOSIS — Z79899 Other long term (current) drug therapy: Secondary | ICD-10-CM | POA: Diagnosis not present

## 2015-03-15 DIAGNOSIS — E785 Hyperlipidemia, unspecified: Secondary | ICD-10-CM | POA: Diagnosis not present

## 2015-03-15 LAB — COMPREHENSIVE METABOLIC PANEL
ALT: 16 U/L (ref 0–35)
AST: 23 U/L (ref 0–37)
Albumin: 3.7 g/dL (ref 3.5–5.2)
Alkaline Phosphatase: 59 U/L (ref 39–117)
BUN: 17 mg/dL (ref 6–23)
CO2: 29 mEq/L (ref 19–32)
Calcium: 9.2 mg/dL (ref 8.4–10.5)
Chloride: 107 mEq/L (ref 96–112)
Creatinine, Ser: 1.07 mg/dL (ref 0.40–1.20)
GFR: 52.2 mL/min — ABNORMAL LOW (ref >60.00)
Glucose, Bld: 95 mg/dL (ref 70–99)
Potassium: 4 mEq/L (ref 3.5–5.1)
Sodium: 141 mEq/L (ref 135–145)
Total Bilirubin: 0.4 mg/dL (ref 0.2–1.2)
Total Protein: 6.6 g/dL (ref 6.0–8.3)

## 2015-03-15 LAB — LDL CHOLESTEROL, DIRECT: Direct LDL: 93 mg/dL

## 2015-03-15 LAB — LIPID PANEL
Cholesterol: 179 mg/dL (ref 0–200)
HDL: 35 mg/dL — ABNORMAL LOW (ref >39.00)
NonHDL: 144
Total CHOL/HDL Ratio: 5
Triglycerides: 246 mg/dL — ABNORMAL HIGH (ref 0.0–149.0)
VLDL: 49.2 mg/dL — ABNORMAL HIGH (ref 0.0–40.0)

## 2015-03-15 LAB — TSH: TSH: 2.07 u[IU]/mL (ref 0.35–4.50)

## 2015-03-19 ENCOUNTER — Encounter: Payer: Self-pay | Admitting: *Deleted

## 2015-03-20 DIAGNOSIS — H5203 Hypermetropia, bilateral: Secondary | ICD-10-CM | POA: Diagnosis not present

## 2015-03-20 DIAGNOSIS — H52223 Regular astigmatism, bilateral: Secondary | ICD-10-CM | POA: Diagnosis not present

## 2015-03-20 DIAGNOSIS — H25813 Combined forms of age-related cataract, bilateral: Secondary | ICD-10-CM | POA: Diagnosis not present

## 2015-04-06 LAB — HM DEXA SCAN: HM Dexa Scan: NORMAL

## 2015-04-11 ENCOUNTER — Ambulatory Visit
Admission: RE | Admit: 2015-04-11 | Discharge: 2015-04-11 | Disposition: A | Discharge status: Home / Self Care | Payer: Medicare Other | Source: Ambulatory Visit | Attending: Internal Medicine | Admitting: Internal Medicine

## 2015-04-11 DIAGNOSIS — M859 Disorder of bone density and structure, unspecified: Secondary | ICD-10-CM | POA: Diagnosis not present

## 2015-04-11 DIAGNOSIS — Z13820 Encounter for screening for osteoporosis: Secondary | ICD-10-CM | POA: Diagnosis not present

## 2015-04-13 ENCOUNTER — Ambulatory Visit (INDEPENDENT_AMBULATORY_CARE_PROVIDER_SITE_OTHER): Payer: Medicare Other | Admitting: Internal Medicine

## 2015-04-13 ENCOUNTER — Encounter: Payer: Self-pay | Admitting: Internal Medicine

## 2015-04-13 VITALS — BP 118/66 | HR 73 | Temp 97.8°F | Resp 14 | Ht 63.0 in | Wt 133.0 lb

## 2015-04-13 DIAGNOSIS — E559 Vitamin D deficiency, unspecified: Secondary | ICD-10-CM | POA: Diagnosis not present

## 2015-04-13 DIAGNOSIS — M545 Low back pain, unspecified: Secondary | ICD-10-CM

## 2015-04-13 DIAGNOSIS — E038 Other specified hypothyroidism: Secondary | ICD-10-CM

## 2015-04-13 DIAGNOSIS — Z79899 Other long term (current) drug therapy: Secondary | ICD-10-CM

## 2015-04-13 DIAGNOSIS — E034 Atrophy of thyroid (acquired): Secondary | ICD-10-CM | POA: Diagnosis not present

## 2015-04-13 DIAGNOSIS — E785 Hyperlipidemia, unspecified: Secondary | ICD-10-CM

## 2015-04-13 MED ORDER — LEVOTHYROXINE SODIUM 50 MCG PO TABS: 50.0000 ug | ORAL_TABLET | Freq: Every day | ORAL | Status: DC

## 2015-04-13 NOTE — Progress Notes (Signed)
Subjective:  Patient ID: Emily Wu, female    DOB: [birth year]  Age: 81 y.o. MRN: [id number]  CC: The primary encounter diagnosis was Hypothyroidism due to acquired atrophy of thyroid. Diagnoses of Low back pain potentially associated with radiculopathy, Hyperlipidemia, Vitamin D deficiency, and Long-term use of high-risk medication were also pertinent to this visit.  HPI Emily Wu presents for increase in flushing,  Occurring almost daily, some days more than others,  Night and day.  Occurring From the waist up.  Head mostly. occurs out of the blue. No nausea, abdominal pain or diarrhea associated with it,  No headaches or visual changes.  No unintentional weight loss.  Patient takes premarin daily,,  And was recently diagnosed with hypothyroidism with medication started.  Since her TSH was reported as therapeutic 4 weeks ago  she has stopped her thyroid medication, because she was not aware that she needed to continue it.    Outpatient Prescriptions Prior to Visit  Medication Sig Dispense Refill  . amitriptyline (ELAVIL) 25 MG tablet Take 1 tablet (25 mg total) by mouth daily. 90 tablet 1  . atorvastatin (LIPITOR) 20 MG tablet Take 1 tablet (20 mg total) by mouth daily. 90 tablet 1  . bifidobacterium infantis (ALIGN) capsule Take 1 capsule by mouth daily. 90 capsule 1  . estrogens, conjugated, (PREMARIN) 0.3 MG tablet Take 1 tablet (0.3 mg total) by mouth daily. 90 tablet 1  . meloxicam (MOBIC) 15 MG tablet Take 1 tablet (15 mg total) by mouth daily. 90 tablet 1  . Multiple Vitamin (MULTIVITAMIN) tablet Take 1 tablet by mouth daily.    . pantoprazole (PROTONIX) 40 MG tablet Take 1 tablet (40 mg total) by mouth daily. 90 tablet 1  . levothyroxine (SYNTHROID, LEVOTHROID) 50 MCG tablet Take 1 tablet (50 mcg total) by mouth daily. In AM 30 minutes prior to food/other medications 90 tablet 3   No facility-administered medications prior to visit.    Review of Systems;  Patient  denies headache, fevers, malaise, unintentional weight loss, skin rash, eye pain, sinus congestion and sinus pain, sore throat, dysphagia,  hemoptysis , cough, dyspnea, wheezing, chest pain, palpitations, orthopnea, edema, abdominal pain, nausea, melena, diarrhea, constipation, flank pain, dysuria, hematuria, urinary  Frequency, nocturia, numbness, tingling, seizures,  Focal weakness, Loss of consciousness,  Tremor, insomnia, depression, anxiety, and suicidal ideation.      Objective:  BP 118/66 mmHg  Pulse 73  Temp(Src) 97.8 F (36.6 C) (Oral)  Resp 14  Ht 5\' 3"  (1.6 m)  Wt 133 lb (60.328 kg)  BMI 23.57 kg/m2  SpO2 99%  BP Readings from Last 3 Encounters:  04/13/15 118/66  01/09/15 128/60  02/04/14 107/46    Wt Readings from Last 3 Encounters:  04/13/15 133 lb (60.328 kg)  01/09/15 133 lb 8 oz (60.555 kg)  02/03/14 128 lb (58.06 kg)    General appearance: alert, cooperative and appears stated age Ears: normal TM's and external ear canals both ears Throat: lips, mucosa, and tongue normal; teeth and gums normal Neck: no adenopathy, no carotid bruit, supple, symmetrical, trachea midline and thyroid not enlarged, symmetric, no tenderness/mass/nodules Back: symmetric, no curvature. ROM normal. No CVA tenderness. Lungs: clear to auscultation bilaterally Heart: regular rate and rhythm, S1, S2 normal, no murmur, click, rub or gallop Abdomen: soft, non-tender; bowel sounds normal; no masses,  no organomegaly Pulses: 2+ and symmetric Skin: Skin color, texture, turgor normal. No rashes or lesions Lymph nodes: Cervical, supraclavicular, and axillary nodes normal.  No results found for: HGBA1C  Lab Results  Component Value Date   CREATININE 1.07 03/15/2015   CREATININE 1.19 01/29/2015   CREATININE 0.94 02/03/2014    Lab Results  Component Value Date   WBC 6.1 01/29/2015   HGB 13.3 01/29/2015   HCT 39.4 01/29/2015   PLT 256.0 01/29/2015   GLUCOSE 95 03/15/2015   CHOL 179  03/15/2015   TRIG 246.0* 03/15/2015   HDL 35.00* 03/15/2015   LDLDIRECT 93.0 03/15/2015   LDLCALC 81 03/09/2012   ALT 16 03/15/2015   AST 23 03/15/2015   NA 141 03/15/2015   K 4.0 03/15/2015   CL 107 03/15/2015   CREATININE 1.07 03/15/2015   BUN 17 03/15/2015   CO2 29 03/15/2015   TSH 2.07 03/15/2015    Dg Bone Density  04/11/2015   EXAM: DUAL X-RAY ABSORPTIOMETRY (DXA) FOR BONE MINERAL DENSITY  IMPRESSION: Dear Dr. Derrel Wu,  Your patient Emily Wu completed a BMD test on 04/11/2015 using the [employer] (analysis version: 14.10) manufactured by [employer]. The following summarizes the results of our evaluation. PATIENT BIOGRAPHICAL: Name: Emily, Wu Patient ID: [id number] Birth Date: [birth year] Height: 62.0 in. Gender: Female Exam Date: 04/11/2015 Weight: 132.3 lbs. Indications: Advanced Age, Caucasian, Height Loss, Hysterectomy, Postmenopausal Fractures: Treatments: lipitor, Multi-Vitamin with calcium, Premarin  ASSESSMENT:  The BMD measured at Femur Neck Right is 0.999 g/cm2 with a T-score of -0.3. This patient is considered normal according to [facility]) criteria. Site Region Measured Measured WHO Young Adult BMD Date       Age      Classification T-score AP Spine L1-L4 6/11/2015 81.8 Normal 1.5 1.375 g/cm2  DualFemur Neck Right 04/11/2015 81.8 Normal -0.3 0.999 g/cm2  World Health Organization [facility]) criteria for post-menopausal, Caucasian Women: Normal:       T-score at or above -1 SD Osteopenia:   T-score between -1 and -2.5 SD Osteoporosis: T-score at or below -2.5 SD  RECOMMENDATIONS: [place] recommends that FDA-approved medical therapies be considered in postmenopausal women and men age 13 or older with a: 1. Hip or vertebral (clinical or morphometric) fracture. 2. T-score of < -2.5 at the spine or hip. 3. Ten-year fracture probability by FRAX of 3% or greater for hip fracture or 20% or greater for major osteoporotic  fracture.  All treatment decisions require clinical judgment and consideration of individual patient factors, including patient preferences, co-morbidities, previous drug use, risk factors not captured in the FRAX model (e.g. falls, vitamin D deficiency, increased bone turnover, interval significant decline in bone density) and possible under - or over-estimation of fracture risk by FRAX.  All patients should ensure an adequate intake of dietary calcium (1200 mg/d) and vitamin D (800 IU daily) unless contraindicated. FOLLOW-UP: People with diagnosed cases of osteoporosis or at high risk for fracture should have regular bone mineral density tests. For patients eligible for Medicare, routine testing is allowed once every 2 years. The testing frequency can be increased to one year for patients who have rapidly progressing disease, those who are receiving or discontinuing medical therapy to restore bone mass, or have additional risk factors.  I have reviewed this report, and agree with the above findings.  [facility]   Electronically Signed   By: Lajean Manes M.D.   On: 04/11/2015 11:37    Assessment & Plan:   Problem List Items Addressed This Visit    Low back pain potentially associated with radiculopathy    Her pain  is controlled but she has occasional sensations of "water running down her left leg, medial side.  She denies urinary incontinence. No further action at this time.       Hyperlipidemia    Well controlled on current statin therapy by April labs.   Liver enzymes are normal , no changes today.  Lab Results  Component Value Date   CHOL 179 03/15/2015   HDL 35.00* 03/15/2015   LDLCALC 81 03/09/2012   LDLDIRECT 93.0 03/15/2015   TRIG 246.0* 03/15/2015   CHOLHDL 5 03/15/2015     Lab Results  Component Value Date   ALT 16 03/15/2015   AST 23 03/15/2015   ALKPHOS 59 03/15/2015   BILITOT 0.4 03/15/2015  \        Relevant Orders   Lipid panel   Hypothyroidism - Primary      Has stopped her thyroid medication after her last level was normal .  Hot flashes presumed to be due to thyroid being underactive.  Resume levothyroxine at 50 mcg dose. And continue indefinitely.       Relevant Medications   levothyroxine (SYNTHROID, LEVOTHROID) 50 MCG tablet   Other Relevant Orders   TSH    Other Visit Diagnoses    Vitamin D deficiency        Relevant Orders    Vit D  25 hydroxy (rtn osteoporosis monitoring)    Long-term use of high-risk medication        Relevant Orders    Comprehensive metabolic panel       I am having Ms. Turbyfill maintain her multivitamin, amitriptyline, atorvastatin, meloxicam, estrogens (conjugated), bifidobacterium infantis, pantoprazole, and levothyroxine.  Meds ordered this encounter  Medications  . levothyroxine (SYNTHROID, LEVOTHROID) 50 MCG tablet    Sig: Take 1 tablet (50 mcg total) by mouth daily. In AM 30 minutes prior to food/other medications    Dispense:  90 tablet    Refill:  3    Medications Discontinued During This Encounter  Medication Reason  . levothyroxine (SYNTHROID, LEVOTHROID) 50 MCG tablet Reorder    Follow-up: No Follow-up on file.   Crecencio Mc, MD

## 2015-04-13 NOTE — Patient Instructions (Addendum)
Your flushing symptoms may be due to your thyroid withdrawing since you stopped the thyroid medication,  Your thyroid is not functioning well without your  thyroid medication so we should resume it.   I do not feel an enlarged lymph node on your neck,  It think your massage therapist may be feeling a bone spur on your cervical vertebrae,  However if it gets larger we will x ray and ultrasound that area   We should repeat your thyroid and cholesterol in 6 months,  Prior to your next visit

## 2015-04-13 NOTE — Assessment & Plan Note (Addendum)
Has stopped her thyroid medication after her last level was normal .  Hot flashes presumed to be due to thyroid being underactive.  Resume levothyroxine at 50 mcg dose. And continue indefinitely.

## 2015-04-13 NOTE — Progress Notes (Signed)
Pre-visit discussion using our clinic review tool. No additional management support is needed unless otherwise documented below in the visit note.  

## 2015-04-15 NOTE — Assessment & Plan Note (Signed)
Her pain is controlled but she has occasional sensations of "water running down her left leg, medial side.  She denies urinary incontinence. No further action at this time.

## 2015-04-15 NOTE — Assessment & Plan Note (Signed)
Well controlled on current statin therapy by April labs.   Liver enzymes are normal , no changes today.  Lab Results  Component Value Date   CHOL 179 03/15/2015   HDL 35.00* 03/15/2015   LDLCALC 81 03/09/2012   LDLDIRECT 93.0 03/15/2015   TRIG 246.0* 03/15/2015   CHOLHDL 5 03/15/2015     Lab Results  Component Value Date   ALT 16 03/15/2015   AST 23 03/15/2015   ALKPHOS 59 03/15/2015   BILITOT 0.4 03/15/2015  \

## 2015-05-17 DIAGNOSIS — L821 Other seborrheic keratosis: Secondary | ICD-10-CM | POA: Diagnosis not present

## 2015-05-17 DIAGNOSIS — D485 Neoplasm of uncertain behavior of skin: Secondary | ICD-10-CM | POA: Diagnosis not present

## 2015-05-17 DIAGNOSIS — D0439 Carcinoma in situ of skin of other parts of face: Secondary | ICD-10-CM | POA: Diagnosis not present

## 2015-05-17 DIAGNOSIS — L668 Other cicatricial alopecia: Secondary | ICD-10-CM | POA: Diagnosis not present

## 2015-05-21 ENCOUNTER — Other Ambulatory Visit: Payer: Self-pay | Admitting: Internal Medicine

## 2015-06-05 DIAGNOSIS — E039 Hypothyroidism, unspecified: Secondary | ICD-10-CM | POA: Diagnosis not present

## 2015-06-07 ENCOUNTER — Other Ambulatory Visit: Payer: Self-pay | Admitting: *Deleted

## 2015-06-07 ENCOUNTER — Other Ambulatory Visit (INDEPENDENT_AMBULATORY_CARE_PROVIDER_SITE_OTHER): Payer: Medicare Other

## 2015-06-07 DIAGNOSIS — E559 Vitamin D deficiency, unspecified: Secondary | ICD-10-CM | POA: Diagnosis not present

## 2015-06-07 DIAGNOSIS — E038 Other specified hypothyroidism: Secondary | ICD-10-CM

## 2015-06-07 DIAGNOSIS — Z79899 Other long term (current) drug therapy: Secondary | ICD-10-CM | POA: Diagnosis not present

## 2015-06-07 DIAGNOSIS — E785 Hyperlipidemia, unspecified: Secondary | ICD-10-CM | POA: Diagnosis not present

## 2015-06-07 DIAGNOSIS — Z139 Encounter for screening, unspecified: Secondary | ICD-10-CM | POA: Diagnosis not present

## 2015-06-07 DIAGNOSIS — E034 Atrophy of thyroid (acquired): Secondary | ICD-10-CM

## 2015-06-07 LAB — COMPREHENSIVE METABOLIC PANEL
ALT: 19 U/L (ref 0–35)
AST: 23 U/L (ref 0–37)
Albumin: 3.8 g/dL (ref 3.5–5.2)
Alkaline Phosphatase: 64 U/L (ref 39–117)
BUN: 17 mg/dL (ref 6–23)
CO2: 31 mEq/L (ref 19–32)
Calcium: 9.5 mg/dL (ref 8.4–10.5)
Chloride: 106 mEq/L (ref 96–112)
Creatinine, Ser: 1.1 mg/dL (ref 0.40–1.20)
GFR: 50.53 mL/min — ABNORMAL LOW (ref >60.00)
Glucose, Bld: 89 mg/dL (ref 70–99)
Potassium: 4 mEq/L (ref 3.5–5.1)
Sodium: 142 mEq/L (ref 135–145)
Total Bilirubin: 0.3 mg/dL (ref 0.2–1.2)
Total Protein: 6.6 g/dL (ref 6.0–8.3)

## 2015-06-07 LAB — VITAMIN D 25 HYDROXY (VIT D DEFICIENCY, FRACTURES): VITD: 47.55 ng/mL (ref 30.00–100.00)

## 2015-06-07 LAB — LDL CHOLESTEROL, DIRECT: Direct LDL: 90 mg/dL

## 2015-06-07 LAB — LIPID PANEL
Cholesterol: 156 mg/dL (ref 0–200)
HDL: 35.6 mg/dL — ABNORMAL LOW (ref >39.00)
NonHDL: 120.4
Total CHOL/HDL Ratio: 4
Triglycerides: 255 mg/dL — ABNORMAL HIGH (ref 0.0–149.0)
VLDL: 51 mg/dL — ABNORMAL HIGH (ref 0.0–40.0)

## 2015-06-07 LAB — TSH: TSH: 2.49 u[IU]/mL (ref 0.35–4.50)

## 2015-06-07 LAB — T4, FREE: Free T4: 0.99 ng/dL (ref 0.60–1.60)

## 2015-06-11 ENCOUNTER — Encounter: Payer: Self-pay | Admitting: *Deleted

## 2015-06-25 DIAGNOSIS — D0439 Carcinoma in situ of skin of other parts of face: Secondary | ICD-10-CM | POA: Diagnosis not present

## 2015-06-25 DIAGNOSIS — Z85828 Personal history of other malignant neoplasm of skin: Secondary | ICD-10-CM | POA: Insufficient documentation

## 2015-07-31 DIAGNOSIS — Z1231 Encounter for screening mammogram for malignant neoplasm of breast: Secondary | ICD-10-CM | POA: Diagnosis not present

## 2015-08-02 ENCOUNTER — Other Ambulatory Visit: Payer: Self-pay | Admitting: Internal Medicine

## 2015-08-06 ENCOUNTER — Encounter (INDEPENDENT_AMBULATORY_CARE_PROVIDER_SITE_OTHER): Payer: Self-pay

## 2015-08-06 ENCOUNTER — Telehealth: Payer: Self-pay | Admitting: Internal Medicine

## 2015-08-06 ENCOUNTER — Ambulatory Visit (INDEPENDENT_AMBULATORY_CARE_PROVIDER_SITE_OTHER): Payer: Medicare Other | Admitting: Internal Medicine

## 2015-08-06 ENCOUNTER — Encounter: Payer: Self-pay | Admitting: Internal Medicine

## 2015-08-06 VITALS — BP 142/92 | HR 64 | Temp 98.2°F | Resp 12 | Ht 63.0 in | Wt 133.0 lb

## 2015-08-06 DIAGNOSIS — Z13820 Encounter for screening for osteoporosis: Secondary | ICD-10-CM | POA: Diagnosis not present

## 2015-08-06 DIAGNOSIS — E038 Other specified hypothyroidism: Secondary | ICD-10-CM | POA: Diagnosis not present

## 2015-08-06 DIAGNOSIS — N951 Menopausal and female climacteric states: Secondary | ICD-10-CM

## 2015-08-06 DIAGNOSIS — R5383 Other fatigue: Secondary | ICD-10-CM

## 2015-08-06 DIAGNOSIS — E034 Atrophy of thyroid (acquired): Secondary | ICD-10-CM

## 2015-08-06 NOTE — Telephone Encounter (Signed)
Pt states she's having Hot flashes pt has taken the Thyroid pill and having flashes and left feels heavy. Pt wants to be seen. No avail appt. Pt would like to come in this week. Pt does not want to see another provider.  Let me know.

## 2015-08-06 NOTE — Telephone Encounter (Signed)
Spoke with pt, scheduled today at 5:30pm

## 2015-08-06 NOTE — Patient Instructions (Signed)
Your current symptoms may be due to your thyroid medication being too strong.  If your thyroid level suggests that this is the case,  We will reduce your levothyroxine dose to 25 mcg daily  If it is normal,  We will try an alternative thyroid medication called cytomel

## 2015-08-06 NOTE — Progress Notes (Signed)
Subjective:  Patient ID: Emily Wu, female    DOB: 1931/01/10  Age: 81 y.o. MRN: [id number]  CC: The primary encounter diagnosis was Hypothyroidism due to acquired atrophy of thyroid. Diagnoses of Other fatigue, Menopausal hot flushes, and Screening for osteoporosis were also pertinent to this visit.  HPI Emily Wu presents for follow up on hypothyroidiism . Brief history:  Patient was diagnosed with elevated TSH of 8.9 In March 2016 but stopped taking her levothyroxine in April 2016 when her TSH HAD  normalized on subsequent check.  She presented in May 2016  with hot flashes and the error was discovered. She resumed 50 mcg dose  6 weeks ago, but the hot flashes have not stopped. She reports having daily sweats, feels extremely fatigued, but  denies chest pain, anorexia, weight loss and and  shortness of breath.   She states that she did try reducing the dose to 12/ tablet but that didn't work out" .  It is not clear when that trial was done. She contineus to take Premarin for menopause management . Outpatient Prescriptions Prior to Visit  Medication Sig Dispense Refill  . amitriptyline (ELAVIL) 25 MG tablet Take 1 tablet (25 mg total) by mouth daily. 90 tablet 1  . atorvastatin (LIPITOR) 20 MG tablet TAKE ONE TABLET BY MOUTH EVERY DAY 90 tablet 1  . bifidobacterium infantis (ALIGN) capsule Take 1 capsule by mouth daily. 90 capsule 1  . estrogens, conjugated, (PREMARIN) 0.3 MG tablet Take 1 tablet (0.3 mg total) by mouth daily. 90 tablet 1  . levothyroxine (SYNTHROID, LEVOTHROID) 50 MCG tablet Take 1 tablet (50 mcg total) by mouth daily. In AM 30 minutes prior to food/other medications 90 tablet 3  . meloxicam (MOBIC) 15 MG tablet Take 1 tablet (15 mg total) by mouth daily. 90 tablet 1  . Multiple Vitamin (MULTIVITAMIN) tablet Take 1 tablet by mouth daily.    . pantoprazole (PROTONIX) 40 MG tablet TAKE ONE TABLET BY MOUTH EVERY DAY 90 tablet 1   No facility-administered  medications prior to visit.    Review of Systems;  Patient denies headache, fevers, malaise  unintentional weight loss, skin rash, eye pain, sinus congestion and sinus pain, sore throat, dysphagia,  hemoptysis , cough, dyspnea, wheezing, chest pain, palpitations, orthopnea, edema, abdominal pain, nausea, melena, diarrhea, constipation, flank pain, dysuria, hematuria, urinary  Frequency, nocturia, numbness, tingling, seizures,  Focal weakness, Loss of consciousness,  Tremor, insomnia, depression, anxiety, and suicidal ideation.      Objective:  BP 142/92 mmHg  Pulse 64  Temp(Src) 98.2 F (36.8 C) (Oral)  Resp 12  Ht 5\' 3"  (1.6 m)  Wt 133 lb (60.328 kg)  BMI 23.57 kg/m2  SpO2 99%  BP Readings from Last 3 Encounters:  08/06/15 142/92  04/13/15 118/66  01/09/15 128/60    Wt Readings from Last 3 Encounters:  08/06/15 133 lb (60.328 kg)  04/13/15 133 lb (60.328 kg)  01/09/15 133 lb 8 oz (60.555 kg)    General appearance: alert, cooperative and appears stated age Ears: normal TM's and external ear canals both ears Throat: lips, mucosa, and tongue normal; teeth and gums normal Neck: no adenopathy, no carotid bruit, supple, symmetrical, trachea midline and thyroid not enlarged, symmetric, no tenderness/mass/nodules Back: symmetric, no curvature. ROM normal. No CVA tenderness. Lungs: clear to auscultation bilaterally Heart: regular rate and rhythm, S1, S2 normal, no murmur, click, rub or gallop Abdomen: soft, non-tender; bowel sounds normal; no masses,  no organomegaly Pulses: 2+ and  symmetric Skin: Skin color, texture, turgor normal. No rashes or lesions Lymph nodes: Cervical, supraclavicular, and axillary nodes normal.  No results found for: HGBA1C  Lab Results  Component Value Date   CREATININE 1.10 06/07/2015   CREATININE 1.07 03/15/2015   CREATININE 1.19 01/29/2015    Lab Results  Component Value Date   WBC 6.1 01/29/2015   HGB 13.3 01/29/2015   HCT 39.4 01/29/2015    PLT 256.0 01/29/2015   GLUCOSE 89 06/07/2015   CHOL 156 06/07/2015   TRIG 255.0* 06/07/2015   HDL 35.60* 06/07/2015   LDLDIRECT 90.0 06/07/2015   LDLCALC 81 03/09/2012   ALT 19 06/07/2015   AST 23 06/07/2015   NA 142 06/07/2015   K 4.0 06/07/2015   CL 106 06/07/2015   CREATININE 1.10 06/07/2015   BUN 17 06/07/2015   CO2 31 06/07/2015   TSH 2.49 06/07/2015    Dg Bone Density  04/11/2015   EXAM: DUAL X-RAY ABSORPTIOMETRY (DXA) FOR BONE MINERAL DENSITY  IMPRESSION: Dear Dr. Derrel Nip,  Your patient Emily Wu completed a BMD test on 04/11/2015 using the [place] (analysis version: 14.10) manufactured by [employer]. The following summarizes the results of our evaluation. PATIENT BIOGRAPHICAL: Name: Emily Wu Patient ID: [id number] Birth Date: [birth year] Height: 62.0 in. Gender: Female Exam Date: 04/11/2015 Weight: 132.3 lbs. Indications: Advanced Age, Caucasian, Height Loss, Hysterectomy, Postmenopausal Fractures: Treatments: lipitor, Multi-Vitamin with calcium, Premarin  ASSESSMENT:  The BMD measured at Femur Neck Right is 0.999 g/cm2 with a T-score of -0.3. This patient is considered normal according to [facility]) criteria. Site Region Measured Measured WHO Young Adult BMD Date       Age      Classification T-score AP Spine L1-L4 04/11/2015 81.8 Normal 1.5 1.375 g/cm2  DualFemur Neck Right 04/11/2015 81.8 Normal -0.3 0.999 g/cm2  World Health Organization [facility]) criteria for post-menopausal, Caucasian Women: Normal:       T-score at or above -1 SD Osteopenia:   T-score between -1 and -2.5 SD Osteoporosis: T-score at or below -2.5 SD  RECOMMENDATIONS: [place] recommends that FDA-approved medical therapies be considered in postmenopausal women and men age 23 or older with a: 1. Hip or vertebral (clinical or morphometric) fracture. 2. T-score of < -2.5 at the spine or hip. 3. Ten-year fracture probability by FRAX of 3% or greater  for hip fracture or 20% or greater for major osteoporotic fracture.  All treatment decisions require clinical judgment and consideration of individual patient factors, including patient preferences, co-morbidities, previous drug use, risk factors not captured in the FRAX model (e.g. falls, vitamin D deficiency, increased bone turnover, interval significant decline in bone density) and possible under - or over-estimation of fracture risk by FRAX.  All patients should ensure an adequate intake of dietary calcium (1200 mg/d) and vitamin D (800 IU daily) unless contraindicated. FOLLOW-UP: People with diagnosed cases of osteoporosis or at high risk for fracture should have regular bone mineral density tests. For patients eligible for Medicare, routine testing is allowed once every 2 years. The testing frequency can be increased to one year for patients who have rapidly progressing disease, those who are receiving or discontinuing medical therapy to restore bone mass, or have additional risk factors.  I have reviewed this report, and agree with the above findings.  [facility] Radiology   Electronically Signed   By: Lajean Manes M.D.   On: 04/11/2015 11:37    Assessment & Plan:   Problem  List Items Addressed This Visit      Unprioritized   Screening for osteoporosis    Normal bone density by May 2016 DEXA       Hypothyroidism - Primary    Repeat TSH is ordered ; she has been taking 50 mcg daily since May.  Reporting extreme fatigue.  If TSH is normal, will change to cytomel to see if she tolerates it better.         Relevant Orders   T4 AND TSH   Menopausal hot flushes    If thyroid function is normal, will increase premarin dose.  She has no lymphadenopathy on exam and no symptoms or history concerning for infection, but will place PPD and obtain chest x ray if tsh is normal.         Other Visit Diagnoses    Other fatigue        Relevant Orders    CBC with Differential/Platelet       I am  having Ms. Luck maintain her multivitamin, amitriptyline, meloxicam, estrogens (conjugated), bifidobacterium infantis, levothyroxine, pantoprazole, and atorvastatin.  No orders of the defined types were placed in this encounter.    There are no discontinued medications.  Follow-up: No Follow-up on file.   Crecencio Mc, MD

## 2015-08-06 NOTE — Progress Notes (Signed)
Pre-visit discussion using our clinic review tool. No additional management support is needed unless otherwise documented below in the visit note.  

## 2015-08-07 DIAGNOSIS — N951 Menopausal and female climacteric states: Secondary | ICD-10-CM | POA: Insufficient documentation

## 2015-08-07 LAB — CBC WITH DIFFERENTIAL/PLATELET
Basophils Absolute: 0.1 10*3/uL (ref 0.0–0.1)
Basophils Relative: 1.2 % (ref 0.0–3.0)
Eosinophils Absolute: 0.2 10*3/uL (ref 0.0–0.7)
Eosinophils Relative: 2.6 % (ref 0.0–5.0)
HCT: 38.2 % (ref 36.0–46.0)
Hemoglobin: 12.8 g/dL (ref 12.0–15.0)
Lymphocytes Relative: 43.8 % (ref 12.0–46.0)
Lymphs Abs: 3 10*3/uL (ref 0.7–4.0)
MCHC: 33.6 g/dL (ref 30.0–36.0)
MCV: 93.5 fl (ref 78.0–100.0)
Monocytes Absolute: 0.7 10*3/uL (ref 0.1–1.0)
Monocytes Relative: 9.6 % (ref 3.0–12.0)
Neutro Abs: 2.9 10*3/uL (ref 1.4–7.7)
Neutrophils Relative %: 42.8 % — ABNORMAL LOW (ref 43.0–77.0)
Platelets: 248 10*3/uL (ref 150.0–400.0)
RBC: 4.08 Mil/uL (ref 3.87–5.11)
RDW: 13.9 % (ref 11.5–15.5)
WBC: 6.8 10*3/uL (ref 4.0–10.5)

## 2015-08-07 NOTE — Assessment & Plan Note (Signed)
Normal bone density by May 2016 DEXA

## 2015-08-07 NOTE — Assessment & Plan Note (Signed)
If thyroid function is normal, will increase premarin dose.  She has no lymphadenopathy on exam and no symptoms or history concerning for infection, but will place PPD and obtain chest x ray if tsh is normal.

## 2015-08-07 NOTE — Assessment & Plan Note (Addendum)
Repeat TSH is ordered ; she has been taking 50 mcg daily since May.  Reporting extreme fatigue.  If TSH is normal, will change to cytomel to see if she tolerates it better.

## 2015-08-08 ENCOUNTER — Other Ambulatory Visit: Payer: Self-pay | Admitting: Internal Medicine

## 2015-08-08 LAB — T4 AND TSH
T4, Total: 10.8 ug/dL (ref 4.5–12.0)
TSH: 2.85 u[IU]/mL (ref 0.450–4.500)

## 2015-08-08 MED ORDER — LIOTHYRONINE SODIUM 5 MCG PO TABS: 5.0000 ug | ORAL_TABLET | Freq: Every day | ORAL | Status: DC

## 2015-08-16 LAB — HM MAMMOGRAPHY: HM Mammogram: NORMAL

## 2015-08-21 ENCOUNTER — Other Ambulatory Visit: Payer: Self-pay | Admitting: Internal Medicine

## 2015-08-21 NOTE — Telephone Encounter (Signed)
Please advise 

## 2015-08-22 ENCOUNTER — Encounter: Payer: Self-pay | Admitting: Internal Medicine

## 2015-08-22 NOTE — Telephone Encounter (Signed)
90 day supply authorized and sent   

## 2015-09-13 ENCOUNTER — Other Ambulatory Visit (INDEPENDENT_AMBULATORY_CARE_PROVIDER_SITE_OTHER): Payer: Medicare Other

## 2015-09-13 ENCOUNTER — Telehealth: Payer: Self-pay | Admitting: *Deleted

## 2015-09-13 DIAGNOSIS — E038 Other specified hypothyroidism: Secondary | ICD-10-CM

## 2015-09-13 DIAGNOSIS — E034 Atrophy of thyroid (acquired): Secondary | ICD-10-CM

## 2015-09-13 NOTE — Telephone Encounter (Signed)
Labs and dx?  

## 2015-09-14 DIAGNOSIS — L538 Other specified erythematous conditions: Secondary | ICD-10-CM | POA: Diagnosis not present

## 2015-09-14 DIAGNOSIS — E034 Atrophy of thyroid (acquired): Secondary | ICD-10-CM | POA: Diagnosis not present

## 2015-09-14 DIAGNOSIS — X32XXXA Exposure to sunlight, initial encounter: Secondary | ICD-10-CM | POA: Diagnosis not present

## 2015-09-14 DIAGNOSIS — E038 Other specified hypothyroidism: Secondary | ICD-10-CM | POA: Diagnosis not present

## 2015-09-14 DIAGNOSIS — D485 Neoplasm of uncertain behavior of skin: Secondary | ICD-10-CM | POA: Diagnosis not present

## 2015-09-14 DIAGNOSIS — Z85828 Personal history of other malignant neoplasm of skin: Secondary | ICD-10-CM | POA: Diagnosis not present

## 2015-09-14 DIAGNOSIS — L570 Actinic keratosis: Secondary | ICD-10-CM | POA: Diagnosis not present

## 2015-09-14 DIAGNOSIS — D0439 Carcinoma in situ of skin of other parts of face: Secondary | ICD-10-CM | POA: Diagnosis not present

## 2015-09-14 DIAGNOSIS — L820 Inflamed seborrheic keratosis: Secondary | ICD-10-CM | POA: Diagnosis not present

## 2015-09-14 NOTE — Addendum Note (Signed)
Addended by: Karlene Einstein D on: 09/14/2015 08:38 AM   Modules accepted: Orders

## 2015-09-17 LAB — T4 AND TSH
T4, Total: 7.4 ug/dL (ref 4.5–12.0)
TSH: 4.57 u[IU]/mL — ABNORMAL HIGH (ref 0.450–4.500)

## 2015-09-20 MED ORDER — LIOTHYRONINE SODIUM 5 MCG PO TABS
10.0000 ug | ORAL_TABLET | Freq: Every day | ORAL | Status: DC
Start: 2015-09-20 — End: 2015-10-16

## 2015-09-20 NOTE — Addendum Note (Signed)
Addended by: Crecencio Mc on: 09/20/2015 08:17 PM   Modules accepted: Orders

## 2015-09-24 ENCOUNTER — Telehealth: Payer: Self-pay | Admitting: Internal Medicine

## 2015-09-24 NOTE — Telephone Encounter (Signed)
Pt called returning your phone call. Please advise  °

## 2015-09-24 NOTE — Telephone Encounter (Signed)
Spoke with patient, regarding a result note from 10/28.

## 2015-10-15 ENCOUNTER — Other Ambulatory Visit (INDEPENDENT_AMBULATORY_CARE_PROVIDER_SITE_OTHER): Payer: Medicare Other

## 2015-10-15 DIAGNOSIS — E0590 Thyrotoxicosis, unspecified without thyrotoxic crisis or storm: Secondary | ICD-10-CM

## 2015-10-15 LAB — TSH: TSH: 2.522 u[IU]/mL (ref 0.350–4.500)

## 2015-10-16 ENCOUNTER — Other Ambulatory Visit: Payer: Self-pay | Admitting: Internal Medicine

## 2015-10-16 ENCOUNTER — Ambulatory Visit (INDEPENDENT_AMBULATORY_CARE_PROVIDER_SITE_OTHER): Payer: Medicare Other | Admitting: Internal Medicine

## 2015-10-16 ENCOUNTER — Encounter: Payer: Self-pay | Admitting: Internal Medicine

## 2015-10-16 VITALS — BP 132/78 | HR 76 | Temp 97.6°F | Resp 12 | Ht 63.0 in | Wt 131.8 lb

## 2015-10-16 DIAGNOSIS — E785 Hyperlipidemia, unspecified: Secondary | ICD-10-CM

## 2015-10-16 DIAGNOSIS — Z13820 Encounter for screening for osteoporosis: Secondary | ICD-10-CM

## 2015-10-16 DIAGNOSIS — E559 Vitamin D deficiency, unspecified: Secondary | ICD-10-CM | POA: Diagnosis not present

## 2015-10-16 DIAGNOSIS — Z79899 Other long term (current) drug therapy: Secondary | ICD-10-CM | POA: Diagnosis not present

## 2015-10-16 DIAGNOSIS — E038 Other specified hypothyroidism: Secondary | ICD-10-CM

## 2015-10-16 DIAGNOSIS — Z8719 Personal history of other diseases of the digestive system: Secondary | ICD-10-CM

## 2015-10-16 DIAGNOSIS — E034 Atrophy of thyroid (acquired): Secondary | ICD-10-CM

## 2015-10-16 DIAGNOSIS — R1013 Epigastric pain: Secondary | ICD-10-CM

## 2015-10-16 NOTE — Progress Notes (Signed)
Subjective:  Patient ID: Emily Wu, female    DOB: 1931/07/31  Age: 81 y.o. MRN: [id number]  CC: The primary encounter diagnosis was Hyperlipidemia with target LDL less than 100. Diagnoses of Vitamin D deficiency, Long-term use of high-risk medication, Hypothyroidism due to acquired atrophy of thyroid, Dyspepsia, History of IBS, and Screening for osteoporosis were also pertinent to this visit.  HPI Emily Wu presents for  Follow up on chronic issues including hyothyroidism, hyperlipidemia and osteoarhritis.  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  At follow up she was noted to have a second lesion in the same area and is scheduled for a repeat procedure  in December/  She is concerned about her husband's declining memory but does not feel that her husband's physician is working it up , because her husband has not allowed her to accompany him to his doctor visits with Dr Sabra Heck at [place].    Refuses the flu vaccine   Outpatient Prescriptions Prior to Visit  Medication Sig Dispense Refill  . amitriptyline (ELAVIL) 25 MG tablet Take 1 tablet (25 mg total) by mouth daily. 90 tablet 1  . atorvastatin (LIPITOR) 20 MG tablet TAKE ONE TABLET BY MOUTH EVERY DAY 90 tablet 1  . bifidobacterium infantis (ALIGN) capsule Take 1 capsule by mouth daily. 90 capsule 1  . meloxicam (MOBIC) 15 MG tablet Take 1 tablet (15 mg total) by mouth daily. 90 tablet 1  . Multiple Vitamin (MULTIVITAMIN) tablet Take 1 tablet by mouth daily.    . pantoprazole (PROTONIX) 40 MG tablet TAKE ONE TABLET BY MOUTH EVERY DAY 90 tablet 1  . PREMARIN 0.3 MG tablet TAKE ONE TABLET BY MOUTH EVERY DAY 90 tablet 2  . liothyronine (CYTOMEL) 5 MCG tablet Take 2 tablets (10 mcg total) by mouth daily. 180 tablet 0   No facility-administered medications prior to visit.    Review of Systems;  Patient denies headache,  fevers, malaise, unintentional weight loss, skin rash, eye pain, sinus congestion and sinus pain, sore throat, dysphagia,  hemoptysis , cough, dyspnea, wheezing, chest pain, palpitations, orthopnea, edema, abdominal pain, nausea, melena, diarrhea, constipation, flank pain, dysuria, hematuria, urinary  Frequency, nocturia, numbness, tingling, seizures,  Focal weakness, Loss of consciousness,  Tremor, insomnia, depression, anxiety, and suicidal ideation.      Objective:  BP 132/78 mmHg  Pulse 76  Temp(Src) 97.6 F (36.4 C) (Oral)  Resp 12  Ht 5\' 3"  (1.6 m)  Wt 131 lb 12 oz (59.761 kg)  BMI 23.34 kg/m2  SpO2 99%  BP Readings from Last 3 Encounters:  10/16/15 132/78  08/06/15 142/92  04/13/15 118/66    Wt Readings from Last 3 Encounters:  10/16/15 131 lb 12 oz (59.761 kg)  08/06/15 133 lb (60.328 kg)  04/13/15 133 lb (60.328 kg)    General appearance: alert, cooperative and appears stated age Ears: normal TM's and external ear canals both ears Throat: lips, mucosa, and tongue normal; teeth and gums normal Neck: no adenopathy, no carotid bruit, supple, symmetrical, trachea midline and thyroid not enlarged, symmetric, no tenderness/mass/nodules Back: symmetric, no curvature. ROM normal. No CVA tenderness. Lungs: clear to auscultation bilaterally Heart: regular rate and rhythm, S1, S2 normal, no murmur, click, rub or gallop Abdomen: soft, non-tender; bowel sounds normal; no masses,  no organomegaly Pulses: 2+ and symmetric Skin: Skin color, texture, turgor normal. No rashes  or lesions Lymph nodes: Cervical, supraclavicular, and axillary nodes normal.  No results found for: HGBA1C  Lab Results  Component Value Date   CREATININE 1.10 06/07/2015   CREATININE 1.07 03/15/2015   CREATININE 1.19 01/29/2015    Lab Results  Component Value Date   WBC 6.8 08/06/2015   HGB 12.8 08/06/2015   HCT 38.2 08/06/2015   PLT 248.0 08/06/2015   GLUCOSE 89 06/07/2015   CHOL 156 06/07/2015     TRIG 255.0* 06/07/2015   HDL 35.60* 06/07/2015   LDLDIRECT 90.0 06/07/2015   LDLCALC 81 03/09/2012   ALT 19 06/07/2015   AST 23 06/07/2015   NA 142 06/07/2015   K 4.0 06/07/2015   CL 106 06/07/2015   CREATININE 1.10 06/07/2015   BUN 17 06/07/2015   CO2 31 06/07/2015   TSH 2.522 10/15/2015    Dg Bone Density  04/11/2015  EXAM: DUAL X-RAY ABSORPTIOMETRY (DXA) FOR BONE MINERAL DENSITY IMPRESSION: Dear Dr. Derrel Nip, Your patient Emily Wu completed a BMD test on 04/11/2015 using the [place] (analysis version: 14.10) manufactured by [employer]. The following summarizes the results of our evaluation. PATIENT BIOGRAPHICAL: Name: Emily, Wu Patient ID: [id number] Birth Date: 02-17-1931 Height: 62.0 in. Gender: Female Exam Date: 04/11/2015 Weight: 132.3 lbs. Indications: Advanced Age, Caucasian, Height Loss, Hysterectomy, Postmenopausal Fractures: Treatments: lipitor, Multi-Vitamin with calcium, Premarin ASSESSMENT: The BMD measured at Femur Neck Right is 0.999 g/cm2 with a T-score of -0.3. This patient is considered normal according to [facility]) criteria. Site Region Measured Measured WHO Young Adult BMD Date       Age      Classification T-score AP Spine L1-L4 04/11/2015 81.8 Normal 1.5 1.375 g/cm2 DualFemur Neck Right 04/11/2015 81.8 Normal -0.3 0.999 g/cm2 World Health Organization [facility]) criteria for post-menopausal, Caucasian Women: Normal:       T-score at or above -1 SD Osteopenia:   T-score between -1 and -2.5 SD Osteoporosis: T-score at or below -2.5 SD RECOMMENDATIONS: [place] recommends that FDA-approved medical therapies be considered in postmenopausal women and men age 75 or older with a: 1. Hip or vertebral (clinical or morphometric) fracture. 2. T-score of < -2.5 at the spine or hip. 3. Ten-year fracture probability by FRAX of 3% or greater for hip fracture or 20% or greater for major osteoporotic fracture. All treatment  decisions require clinical judgment and consideration of individual patient factors, including patient preferences, co-morbidities, previous drug use, risk factors not captured in the FRAX model (e.g. falls, vitamin D deficiency, increased bone turnover, interval significant decline in bone density) and possible under - or over-estimation of fracture risk by FRAX. All patients should ensure an adequate intake of dietary calcium (1200 mg/d) and vitamin D (800 IU daily) unless contraindicated. FOLLOW-UP: People with diagnosed cases of osteoporosis or at high risk for fracture should have regular bone mineral density tests. For patients eligible for Medicare, routine testing is allowed once every 2 years. The testing frequency can be increased to one year for patients who have rapidly progressing disease, those who are receiving or discontinuing medical therapy to restore bone mass, or have additional risk factors. I have reviewed this report, and agree with the above findings. [facility] Radiology Electronically Signed   By: Lajean Manes M.D.   On: 04/11/2015 11:37    Assessment & Plan:   Problem List Items Addressed This Visit    Hyperlipidemia with target LDL less than 100 - Primary    Well controlled  on current statin therapy by July labs.   Liver enzymes are normal , no changes today.  Lab Results  Component Value Date   CHOL 156 06/07/2015   HDL 35.60* 06/07/2015   LDLCALC 81 03/09/2012   LDLDIRECT 90.0 06/07/2015   TRIG 255.0* 06/07/2015   CHOLHDL 4 06/07/2015     Lab Results  Component Value Date   ALT 19 06/07/2015   AST 23 06/07/2015   ALKPHOS 64 06/07/2015   BILITOT 0.3 06/07/2015  \          Relevant Orders   Lipid panel   LDL cholesterol, direct   History of IBS    Managed with Elavil and probiotics. No changes today.  Symptoms are quiescent.      Dyspepsia    Managed with PPI.  Discussed current controversy regarding prolonged use of PPI in patients without  documented Barretts esophagus.  Patient has no prior EGD but has been on PPI therapy for > 5 years (per patient).  Suggested trial of pepcid 20 mg daily.  If GERD symptoms return,  advised her to accept referral for EGD.        Screening for osteoporosis    Normal bone density by May 2016 DEXA . Discussed the current controversies surrounding the risks and benefits of calcium supplementation.  Encouraged her to increase dietary calcium through natural foods including almond/coconut milk        Hypothyroidism    She has changed therapy to cytomel and is currently taking 10 mcg daily.  Repeat TSH is normal .  Lab Results  Component Value Date   TSH 2.522 10/15/2015          Other Visit Diagnoses    Vitamin D deficiency        Relevant Orders    VITAMIN D 25 Hydroxy (Vit-D Deficiency, Fractures)    Long-term use of high-risk medication        Relevant Orders    Comprehensive metabolic panel      A total of 25 minutes of face to face time was spent with patient more than half of which was spent in counselling about the above mentioned conditions  and coordination of care  I am having Ms. Witherington maintain her multivitamin, amitriptyline, meloxicam, bifidobacterium infantis, pantoprazole, atorvastatin, and PREMARIN.  No orders of the defined types were placed in this encounter.    There are no discontinued medications.  Follow-up: No Follow-up on file.   Crecencio Mc, MD

## 2015-10-16 NOTE — Progress Notes (Signed)
Pre-visit discussion using our clinic review tool. No additional management support is needed unless otherwise documented below in the visit note.  

## 2015-10-16 NOTE — Patient Instructions (Addendum)
Return in January for fasting labs  Return in May for your annual physical with me   Your wellness exam will be with Denisa in March   Increase your water intake to 3 16 ounce servings daily   Your husband's memory issues should be evaluated with:  A Mini Mental Status exam Labs to rule out thyroid, B12 deficiencies which are reversible And an MRI of the brain

## 2015-10-17 ENCOUNTER — Telehealth: Payer: Self-pay | Admitting: Internal Medicine

## 2015-10-17 NOTE — Telephone Encounter (Signed)
Pt called and would like a copy of her labs from yesterday. Thank You!

## 2015-10-17 NOTE — Telephone Encounter (Signed)
Copy of labs mailed  

## 2015-10-18 NOTE — Assessment & Plan Note (Signed)
Normal bone density by May 2016 DEXA . Discussed the current controversies surrounding the risks and benefits of calcium supplementation.  Encouraged her to increase dietary calcium through natural foods including almond/coconut milk

## 2015-10-18 NOTE — Assessment & Plan Note (Signed)
Managed with PPI.  Discussed current controversy regarding prolonged use of PPI in patients without documented Barretts esophagus.  Patient has no prior EGD but has been on PPI therapy for > 5 years (per patient).  Suggested trial of pepcid 20 mg daily.  If GERD symptoms return,  advised her to accept referral for EGD.

## 2015-10-18 NOTE — Assessment & Plan Note (Signed)
Well controlled on current statin therapy by July labs.   Liver enzymes are normal , no changes today.  Lab Results  Component Value Date   CHOL 156 06/07/2015   HDL 35.60* 06/07/2015   LDLCALC 81 03/09/2012   LDLDIRECT 90.0 06/07/2015   TRIG 255.0* 06/07/2015   CHOLHDL 4 06/07/2015     Lab Results  Component Value Date   ALT 19 06/07/2015   AST 23 06/07/2015   ALKPHOS 64 06/07/2015   BILITOT 0.3 06/07/2015  \

## 2015-10-18 NOTE — Assessment & Plan Note (Signed)
She has changed therapy to cytomel and is currently taking 10 mcg daily.  Repeat TSH is normal .  Lab Results  Component Value Date   TSH 2.522 10/15/2015

## 2015-10-18 NOTE — Assessment & Plan Note (Signed)
Managed with Elavil and probiotics. No changes today.  Symptoms are quiescent.

## 2015-10-22 ENCOUNTER — Encounter: Payer: Self-pay | Admitting: *Deleted

## 2015-10-25 DIAGNOSIS — D0439 Carcinoma in situ of skin of other parts of face: Secondary | ICD-10-CM | POA: Diagnosis not present

## 2015-10-30 ENCOUNTER — Other Ambulatory Visit: Payer: Medicare Other

## 2015-11-26 ENCOUNTER — Other Ambulatory Visit: Payer: Self-pay | Admitting: Internal Medicine

## 2015-11-27 ENCOUNTER — Other Ambulatory Visit: Payer: Medicare Other

## 2015-12-04 ENCOUNTER — Other Ambulatory Visit: Payer: Medicare Other

## 2015-12-06 DIAGNOSIS — L821 Other seborrheic keratosis: Secondary | ICD-10-CM | POA: Diagnosis not present

## 2015-12-06 DIAGNOSIS — L570 Actinic keratosis: Secondary | ICD-10-CM | POA: Diagnosis not present

## 2015-12-06 DIAGNOSIS — Z85828 Personal history of other malignant neoplasm of skin: Secondary | ICD-10-CM | POA: Diagnosis not present

## 2015-12-06 DIAGNOSIS — D1801 Hemangioma of skin and subcutaneous tissue: Secondary | ICD-10-CM | POA: Diagnosis not present

## 2015-12-06 DIAGNOSIS — X32XXXA Exposure to sunlight, initial encounter: Secondary | ICD-10-CM | POA: Diagnosis not present

## 2015-12-19 ENCOUNTER — Other Ambulatory Visit: Payer: Self-pay | Admitting: Internal Medicine

## 2015-12-20 NOTE — Telephone Encounter (Signed)
Ok to fill 

## 2015-12-21 ENCOUNTER — Other Ambulatory Visit: Payer: Self-pay | Admitting: Internal Medicine

## 2015-12-21 NOTE — Telephone Encounter (Signed)
DONE .  OFFICE VISIT NEEDED prior to any more refills

## 2015-12-24 ENCOUNTER — Other Ambulatory Visit: Payer: Self-pay | Admitting: Internal Medicine

## 2016-01-03 ENCOUNTER — Other Ambulatory Visit (INDEPENDENT_AMBULATORY_CARE_PROVIDER_SITE_OTHER): Payer: Medicare Other

## 2016-01-03 ENCOUNTER — Telehealth: Payer: Self-pay

## 2016-01-03 DIAGNOSIS — Z79899 Other long term (current) drug therapy: Secondary | ICD-10-CM | POA: Diagnosis not present

## 2016-01-03 DIAGNOSIS — E559 Vitamin D deficiency, unspecified: Secondary | ICD-10-CM

## 2016-01-03 DIAGNOSIS — E785 Hyperlipidemia, unspecified: Secondary | ICD-10-CM | POA: Diagnosis not present

## 2016-01-03 DIAGNOSIS — E673 Hypervitaminosis D: Secondary | ICD-10-CM

## 2016-01-03 LAB — COMPREHENSIVE METABOLIC PANEL
ALT: 16 U/L (ref 0–35)
AST: 22 U/L (ref 0–37)
Albumin: 3.9 g/dL (ref 3.5–5.2)
Alkaline Phosphatase: 68 U/L (ref 39–117)
BUN: 18 mg/dL (ref 6–23)
CO2: 31 mEq/L (ref 19–32)
Calcium: 9.2 mg/dL (ref 8.4–10.5)
Chloride: 104 mEq/L (ref 96–112)
Creatinine, Ser: 1.07 mg/dL (ref 0.40–1.20)
GFR: 52.1 mL/min — ABNORMAL LOW (ref >60.00)
Glucose, Bld: 92 mg/dL (ref 70–99)
Potassium: 3.8 mEq/L (ref 3.5–5.1)
Sodium: 141 mEq/L (ref 135–145)
Total Bilirubin: 0.4 mg/dL (ref 0.2–1.2)
Total Protein: 6.4 g/dL (ref 6.0–8.3)

## 2016-01-03 LAB — LIPID PANEL
Cholesterol: 180 mg/dL (ref 0–200)
HDL: 40.2 mg/dL (ref >39.00)
NonHDL: 139.44
Total CHOL/HDL Ratio: 4
Triglycerides: 205 mg/dL — ABNORMAL HIGH (ref 0.0–149.0)
VLDL: 41 mg/dL — ABNORMAL HIGH (ref 0.0–40.0)

## 2016-01-03 LAB — VITAMIN D 25 HYDROXY (VIT D DEFICIENCY, FRACTURES): VITD: >120 ng/mL (ref 30.00–100.00)

## 2016-01-03 LAB — LDL CHOLESTEROL, DIRECT: Direct LDL: 97 mg/dL

## 2016-01-03 NOTE — Telephone Encounter (Signed)
Have her stop all vitamin D supplements immediately.  We will recheck her level in a month

## 2016-01-03 NOTE — Telephone Encounter (Signed)
Received STAT lab call,  Received from [place] at [facility] Lab Lab: Vitamin D level Result: >120.  Please advise.

## 2016-01-03 NOTE — Telephone Encounter (Signed)
Spoke with the patient.  She isn't taking any vitamin d supplements.  She only takes a multivitamin that has a sm amount of D in it.  Please advise?

## 2016-01-03 NOTE — Telephone Encounter (Signed)
Have her stop the multivitamin and any calcium supplements and return  for repeat labs in one week .

## 2016-01-03 NOTE — Addendum Note (Signed)
Addended by: Crecencio Mc on: 01/03/2016 04:49 PM   Modules accepted: Orders

## 2016-01-03 NOTE — Telephone Encounter (Signed)
Attempted to call patient, left a VM to return my call.

## 2016-01-04 NOTE — Telephone Encounter (Signed)
Spoke with the patient , she is going to stop the multivitamin and return next Friday for repeat labs. Thanks

## 2016-01-11 ENCOUNTER — Other Ambulatory Visit (INDEPENDENT_AMBULATORY_CARE_PROVIDER_SITE_OTHER): Payer: Medicare Other

## 2016-01-11 ENCOUNTER — Telehealth: Payer: Self-pay

## 2016-01-11 DIAGNOSIS — E673 Hypervitaminosis D: Secondary | ICD-10-CM

## 2016-01-11 LAB — VITAMIN D 25 HYDROXY (VIT D DEFICIENCY, FRACTURES): VITD: 60.57 ng/mL (ref 30.00–100.00)

## 2016-01-11 NOTE — Telephone Encounter (Signed)
Pt wanted a copy of her lab results, pt given copy from 01/03/16

## 2016-01-11 NOTE — Progress Notes (Signed)
Patient here today for lab draw only. 

## 2016-01-12 LAB — CALCIUM, IONIZED: Calcium, Ion: 1.26 mmol/L (ref 1.12–1.32)

## 2016-01-14 LAB — PARATHYROID HORMONE, INTACT (NO CA): PTH: 22 pg/mL (ref 14–64)

## 2016-01-16 ENCOUNTER — Telehealth: Payer: Self-pay | Admitting: Internal Medicine

## 2016-01-16 NOTE — Telephone Encounter (Signed)
Patient will be at phone number [phone] until about 3pm

## 2016-01-16 NOTE — Telephone Encounter (Signed)
Pt lvm stating she was returning kathy's call. vm left at 1:46 pm on 2/22

## 2016-01-18 ENCOUNTER — Encounter: Payer: Self-pay | Admitting: *Deleted

## 2016-01-18 NOTE — Telephone Encounter (Signed)
Mailed letter with results. 

## 2016-02-05 ENCOUNTER — Other Ambulatory Visit: Payer: Self-pay | Admitting: Internal Medicine

## 2016-02-20 DIAGNOSIS — D0439 Carcinoma in situ of skin of other parts of face: Secondary | ICD-10-CM | POA: Diagnosis not present

## 2016-02-21 ENCOUNTER — Encounter: Payer: Self-pay | Admitting: Family Medicine

## 2016-02-21 ENCOUNTER — Ambulatory Visit (INDEPENDENT_AMBULATORY_CARE_PROVIDER_SITE_OTHER): Payer: Medicare Other | Admitting: Family Medicine

## 2016-02-21 VITALS — BP 102/64 | HR 90 | Temp 98.1°F | Wt 130.0 lb

## 2016-02-21 DIAGNOSIS — B349 Viral infection, unspecified: Secondary | ICD-10-CM

## 2016-02-21 DIAGNOSIS — J988 Other specified respiratory disorders: Secondary | ICD-10-CM

## 2016-02-21 DIAGNOSIS — B9789 Other viral agents as the cause of diseases classified elsewhere: Secondary | ICD-10-CM | POA: Insufficient documentation

## 2016-02-21 MED ORDER — GUAIFENESIN-CODEINE 100-10 MG/5ML PO SYRP: 5.0000 mL | ORAL_SOLUTION | Freq: Three times a day (TID) | ORAL | Status: DC | PRN

## 2016-02-21 MED ORDER — PREDNISONE 50 MG PO TABS: ORAL_TABLET | ORAL | Status: DC

## 2016-02-21 NOTE — Patient Instructions (Signed)
This is viral.  Take the prednisone as prescribed.  Use the cough medication as needed.  Take care  Dr. Lacinda Axon

## 2016-02-21 NOTE — Progress Notes (Signed)
Subjective:  Patient ID: Emily Wu, female    DOB: 1931-11-01  Age: 82 y.o. MRN: [id number]  CC: ST, cough  HPI:  82 year old female with recurrent C. difficile presents with the above complaints.  Patient states that she's been sick for the past 2 days. She been using severe sore throat and cough. No associated fever. Cough is mildly productive. She reports mild shortness of breath. She's been taking over-the-counter cough medication with no significant improvement. No known exacerbating or relieving factors. No other complaints today.  Social Hx   Social History   Social History  . Marital Status: Married    Spouse Name: N/A  . Number of Children: N/A  . Years of Education: N/A   Social History Main Topics  . Smoking status: Former Smoker -- 2.00 packs/day for 7 years    Types: Cigarettes  . Smokeless tobacco: Never Used     Comment: 02/03/2014 "quit smoking in the 1960's"  . Alcohol Use: Yes     Comment: 02/03/2014 "glass of wine a couple times/yr"  . Drug Use: No  . Sexual Activity: No   Other Topics Concern  . None   Social History Narrative   Review of Systems  Constitutional: Negative for fever.  HENT: Positive for sore throat.   Respiratory: Positive for cough.    Objective:  BP 102/64 mmHg  Pulse 90  Temp(Src) 98.1 F (36.7 C) (Oral)  Wt 130 lb (58.968 kg)  SpO2 97%  BP/Weight 02/21/2016 10/16/2015 [id number]  Systolic BP [id number]  Diastolic BP 64 78 92  Wt. (Lbs) 130 131.75 133  BMI 23.03 23.34 23.57   Physical Exam  Constitutional: She appears well-developed. No distress.  HENT:  Head: Normocephalic and atraumatic.  Mouth/Throat: Oropharynx is clear and moist.  Eyes: Conjunctivae are normal.  Cardiovascular: Normal rate and regular rhythm.   Pulmonary/Chest: Effort normal and breath sounds normal. No respiratory distress. She has no wheezes. She has no rales.  Neurological: She is alert.  Vitals reviewed.  Lab Results  Component  Value Date   WBC 6.8 08/06/2015   HGB 12.8 08/06/2015   HCT 38.2 08/06/2015   PLT 248.0 08/06/2015   GLUCOSE 92 01/03/2016   CHOL 180 01/03/2016   TRIG 205.0* 01/03/2016   HDL 40.20 01/03/2016   LDLDIRECT 97.0 01/03/2016   LDLCALC 81 03/09/2012   ALT 16 01/03/2016   AST 22 01/03/2016   NA 141 01/03/2016   K 3.8 01/03/2016   CL 104 01/03/2016   CREATININE 1.07 01/03/2016   BUN 18 01/03/2016   CO2 31 01/03/2016   TSH 2.522 10/15/2015    Assessment & Plan:   Problem List Items Addressed This Visit    Viral respiratory illness - Primary    New problem. This is likely viral in origin. Exam unremarkable today. I advised against antibiotics especially in the setting of recurrent C. difficile. Patient is in agreement. Treating with prednisone and Cheratussin.         Meds ordered this encounter  Medications  . predniSONE (DELTASONE) 50 MG tablet    Sig: 1 tablet daily x 5 days.    Dispense:  5 tablet    Refill:  0  . guaiFENesin-codeine (ROBITUSSIN AC) 100-10 MG/5ML syrup    Sig: Take 5 mLs by mouth 3 (three) times daily as needed for cough.    Dispense:  120 mL    Refill:  0    Follow-up: PRN  Thersa Salt DO [facility]  [place]

## 2016-02-21 NOTE — Assessment & Plan Note (Signed)
New problem. This is likely viral in origin. Exam unremarkable today. I advised against antibiotics especially in the setting of recurrent C. difficile. Patient is in agreement. Treating with prednisone and Cheratussin.

## 2016-02-21 NOTE — Progress Notes (Signed)
Pre visit review using our clinic review tool, if applicable. No additional management support is needed unless otherwise documented below in the visit note. 

## 2016-03-18 ENCOUNTER — Other Ambulatory Visit: Payer: Self-pay | Admitting: Internal Medicine

## 2016-03-20 DIAGNOSIS — L821 Other seborrheic keratosis: Secondary | ICD-10-CM | POA: Diagnosis not present

## 2016-03-20 DIAGNOSIS — Z08 Encounter for follow-up examination after completed treatment for malignant neoplasm: Secondary | ICD-10-CM | POA: Diagnosis not present

## 2016-03-20 DIAGNOSIS — Z85828 Personal history of other malignant neoplasm of skin: Secondary | ICD-10-CM | POA: Diagnosis not present

## 2016-04-14 ENCOUNTER — Ambulatory Visit (INDEPENDENT_AMBULATORY_CARE_PROVIDER_SITE_OTHER)
Admission: RE | Admit: 2016-04-14 | Discharge: 2016-04-14 | Disposition: A | Discharge status: Home / Self Care | Payer: Medicare Other | Source: Ambulatory Visit | Attending: Internal Medicine | Admitting: Internal Medicine

## 2016-04-14 ENCOUNTER — Ambulatory Visit (INDEPENDENT_AMBULATORY_CARE_PROVIDER_SITE_OTHER): Payer: Medicare Other | Admitting: Internal Medicine

## 2016-04-14 VITALS — BP 138/66 | HR 84 | Temp 97.8°F | Resp 12 | Ht 63.0 in | Wt 132.0 lb

## 2016-04-14 DIAGNOSIS — M25551 Pain in right hip: Secondary | ICD-10-CM | POA: Diagnosis not present

## 2016-04-14 DIAGNOSIS — R0609 Other forms of dyspnea: Secondary | ICD-10-CM

## 2016-04-14 DIAGNOSIS — R0600 Dyspnea, unspecified: Secondary | ICD-10-CM

## 2016-04-14 DIAGNOSIS — R232 Flushing: Secondary | ICD-10-CM | POA: Diagnosis not present

## 2016-04-14 DIAGNOSIS — Z0000 Encounter for general adult medical examination without abnormal findings: Secondary | ICD-10-CM | POA: Diagnosis not present

## 2016-04-14 DIAGNOSIS — M25552 Pain in left hip: Secondary | ICD-10-CM

## 2016-04-14 DIAGNOSIS — N951 Menopausal and female climacteric states: Secondary | ICD-10-CM

## 2016-04-14 DIAGNOSIS — E034 Atrophy of thyroid (acquired): Secondary | ICD-10-CM

## 2016-04-14 DIAGNOSIS — K635 Polyp of colon: Secondary | ICD-10-CM

## 2016-04-14 DIAGNOSIS — E038 Other specified hypothyroidism: Secondary | ICD-10-CM | POA: Diagnosis not present

## 2016-04-14 DIAGNOSIS — E785 Hyperlipidemia, unspecified: Secondary | ICD-10-CM | POA: Diagnosis not present

## 2016-04-14 DIAGNOSIS — R0602 Shortness of breath: Secondary | ICD-10-CM | POA: Diagnosis not present

## 2016-04-14 DIAGNOSIS — R0989 Other specified symptoms and signs involving the circulatory and respiratory systems: Secondary | ICD-10-CM

## 2016-04-14 DIAGNOSIS — M25559 Pain in unspecified hip: Secondary | ICD-10-CM | POA: Diagnosis not present

## 2016-04-14 DIAGNOSIS — S79911A Unspecified injury of right hip, initial encounter: Secondary | ICD-10-CM | POA: Diagnosis not present

## 2016-04-14 LAB — TSH: TSH: 3.67 u[IU]/mL (ref 0.35–4.50)

## 2016-04-14 NOTE — Patient Instructions (Addendum)
Please go to [facility] for your  X rays today   You can increase your Premarin to 2 tablets daily to see if it helps yoru hot flushes   Try taking miralax EVERY DAY TO MAKE YOUR BOWELS MOVE MORE REGULARLY AND PREVENT DIARRHEA   We will order cologuard for your next colon cancer screening

## 2016-04-14 NOTE — Progress Notes (Signed)
Pre-visit discussion using our clinic review tool. No additional management support is needed unless otherwise documented below in the visit note.  

## 2016-04-14 NOTE — Progress Notes (Addendum)
Patient ID: Emily Wu, female    DOB: [birth year]  Age: 82 y.o. MRN: [id number]  The patient is here for her annual Medicare wellness examination and management of other chronic and acute problems.  Colon cancer screening due for history of polyps. .  Use of cologuard discussed Mammogram sept 2016 [facility] , has implants  The risk factors are reflected in the social history.  The roster of all physicians providing medical care to patient - is listed in the Snapshot section of the chart.  Activities of daily living:  The patient is 100% independent in all ADLs: dressing, toileting, feeding as well as independent mobility  Home safety : The patient has smoke detectors in the home. They wear seatbelts.  There are no firearms at home. There is no violence in the home.   There is no risks for hepatitis, STDs or HIV. There is no   history of blood transfusion. They have no travel history to infectious disease endemic areas of the world.  The patient has seen their dentist in the last six month. They have seen their eye doctor in the last year. They admit to slight hearing difficulty with regard to whispered voices and some television programs.  They have deferred audiologic testing in the last year.  They do not  have excessive sun exposure. Discussed the need for sun protection: hats, long sleeves and use of sunscreen if there is significant sun exposure.   Diet: the importance of a healthy diet is discussed. They do have a healthy diet.  The benefits of regular aerobic exercise were discussed. She does not walk  Does some exercises her legs lifts in bed     Depression screen: there are no signs or vegative symptoms of depression- irritability, change in appetite, anhedonia, sadness/tearfullness.  Cognitive assessment: the patient manages all their financial and personal affairs and is actively engaged. They could relate day,date,year and events; recalled 2/3 objects at 3 minutes; performed  clock-face test normally.  The following portions of the patient's history were reviewed and updated as appropriate: allergies, current medications, past family history, past medical history,  past surgical history, past social history  and problem list.  Visual acuity was not assessed per patient preference since she has regular follow up with her ophthalmologist. Hearing and body mass index were assessed and reviewed.   During the course of the visit the patient was educated and counseled about appropriate screening and preventive services including : fall prevention , diabetes screening, nutrition counseling, colorectal cancer screening, and recommended immunizations.    CC: The primary encounter diagnosis was Colon polyps. Diagnoses of Left carotid bruit, Skin blushing/flushing, Hip pain, left, Dyspnea, Menopausal hot flushes, Hypothyroidism due to acquired atrophy of thyroid, Hyperlipidemia with target LDL less than 100, Medicare annual wellness visit, subsequent, Exertional dyspnea, and Hip pain, acute, right were also pertinent to this visit.   1) Right medial hip pain since Friday,  worse with lifting leg, occurred after a fall on her stone patio that occurred while bending over to tend to her potted plants.   2) Having chronic loose stools. Since haviing c dif  Colitis  2012  . Occurring  3-4 times per week.  Has not treid taking metamucil daily, diarrhea alternates with  constipaiton   3) Shortness of breath with exertion .  No chest pain,  does not walk regularly .  Exercises legs in bed with leg raises.   4) Hot flashes ,  New onset hot flashes  just started a few months  Ago and have been "drenching  Her head>'  Not occurring in the middle of the night , but occurring during the day .  Not associated with fecal urgency  Or loose stools.  No chest pain or shortness of breath associated with episodes.  Takes premarin since her mid 40's but over the years has reduced her dose .  Occuring  2-3  daiy and drenching her head.  Intolerable.   History [place] has a past medical history of Hypertension; High cholesterol; Bruises easily; Tendency toward bleeding easily ([place]); Arthritis; and Cervical cancer ([place]) (1965).   She has past surgical history that includes Knee arthroscopy (Left); Shoulder open rotator cuff repair (Bilateral, 2007,  2010); Abdominal hysterectomy (1965); Appendectomy; Cholecystectomy; and Throat surgery (2014).   Her family history includes Heart disease (age of onset: 87) in her father; Hyperlipidemia in her father; Peripheral vascular disease in her mother; Stroke in her father. There is no history of Cancer.She reports that she has quit smoking. Her smoking use included Cigarettes. She has a 14 pack-year smoking history. She has never used smokeless tobacco. She reports that she drinks alcohol. She reports that she does not use illicit drugs.  Outpatient Prescriptions Prior to Visit  Medication Sig Dispense Refill  . amitriptyline (ELAVIL) 25 MG tablet TAKE ONE TABLET BY MOUTH EVERY DAY 90 tablet 1  . atorvastatin (LIPITOR) 20 MG tablet TAKE ONE TABLET EVERY DAY 90 tablet 2  . bifidobacterium infantis (ALIGN) capsule Take 1 capsule by mouth daily. 90 capsule 1  . guaiFENesin-codeine (ROBITUSSIN AC) 100-10 MG/5ML syrup Take 5 mLs by mouth 3 (three) times daily as needed for cough. 120 mL 0  . liothyronine (CYTOMEL) 5 MCG tablet TAKE TWO TABLETS EVERY DAY 180 tablet 3  . meloxicam (MOBIC) 15 MG tablet TAKE ONE TABLET EVERY DAY NEED TO SCHEDULE APPOINTMENT IN FEBRUARY 90 tablet 1  . Multiple Vitamin (MULTIVITAMIN) tablet Take 1 tablet by mouth daily.    . pantoprazole (PROTONIX) 40 MG tablet TAKE ONE TABLET BY MOUTH EVERY DAY 90 tablet 2  . PREMARIN 0.3 MG tablet TAKE ONE TABLET BY MOUTH EVERY DAY 90 tablet 2  . predniSONE (DELTASONE) 50 MG tablet 1 tablet daily x 5 days. (Patient not taking: Reported on 04/14/2016) 5 tablet 0   No facility-administered  medications prior to visit.    Review of Systems   Patient denies headache, fevers, malaise, unintentional weight loss, skin rash, eye pain, sinus congestion and sinus pain, sore throat, dysphagia,  hemoptysis , cough, dyspnea, wheezing, chest pain, palpitations, orthopnea, edema, abdominal pain, nausea, melena, diarrhea, constipation, flank pain, dysuria, hematuria, urinary  Frequency, nocturia, numbness, tingling, seizures,  Focal weakness, Loss of consciousness,  Tremor, insomnia, depression, anxiety, and suicidal ideation.      Objective:  BP 138/66 mmHg  Pulse 84  Temp(Src) 97.8 F (36.6 C) (Oral)  Resp 12  Ht 5\' 3"  (1.6 m)  Wt 132 lb (59.875 kg)  BMI 23.39 kg/m2  SpO2 98%  Physical Exam  General appearance: alert, cooperative and appears stated age Head: Normocephalic, without obvious abnormality, atraumatic Eyes: conjunctivae/corneas clear. PERRL, EOM's intact. Fundi benign. Ears: normal TM's and external ear canals both ears Nose: Nares normal. Septum midline. Mucosa normal. No drainage or sinus tenderness. Throat: lips, mucosa, and tongue normal; teeth and gums normal Neck: no adenopathy, no carotid bruit, no JVD, supple, symmetrical, trachea midline and thyroid not enlarged, symmetric, no tenderness/mass/nodules Lungs: clear to auscultation bilaterally Breasts: normal  appearance, saline implants,  no masses or tenderness Heart: regular rate and rhythm, S1, S2 normal, no murmur, click, rub or gallop Abdomen: soft, non-tender; bowel sounds normal; no masses,  no organomegaly Extremities: extremities normal, atraumatic, no cyanosis or edema Pulses: 2+ and symmetric Skin: Skin color, texture, turgor normal. No rashes or lesions. Bruising noted on right buttock  Neurologic: Alert and oriented X 3, normal strength and tone. Normal symmetric reflexes. Normal coordination and gait. MSK: anterior proximal thigh  pain with resisted right foot pointing or raise.     Assessment &  Plan:   Problem List Items Addressed This Visit    Hip pain, acute    Medial right,  With bruising of buttock noted on today's exam,  Hip and pelvic films were ordered to rule out fractures of hip and pelvis,  No fractures were appreciated but severe degenerative changes were noted at the hip joint and the lumbosacral level  .  PT evaluation accepted.       Relevant Orders   Ambulatory referral to Physical Therapy   Colon polyps - Primary    She is due for follow up but requesting an alternative,  Cologuard discussed and accepted.       Hyperlipidemia with target LDL less than 100    Well controlled on current statin therapy by [facility] labs.   Liver enzymes were normal in February ,  no changes today.  Lab Results  Component Value Date   CHOL 180 01/03/2016   HDL 40.20 01/03/2016   LDLCALC 81 03/09/2012   LDLDIRECT 97.0 01/03/2016   TRIG 205.0* 01/03/2016   CHOLHDL 4 01/03/2016     Lab Results  Component Value Date   ALT 16 01/03/2016   AST 22 01/03/2016   ALKPHOS 68 01/03/2016   BILITOT 0.4 01/03/2016  \            Medicare annual wellness visit, subsequent    Annual Medicare wellness  exam was done as well as a comprehensive physical exam and management of acute and chronic conditions .  During the course of the visit the patient was educated and counseled about appropriate screening and preventive services including : fall prevention , diabetes screening, nutrition counseling, colorectal cancer screening, and recommended immunizations.  Printed recommendations for health maintenance screenings was given.       Hypothyroidism    Thyroid function is WNL on current dose.  No current changes needed.   Lab Results  Component Value Date   TSH 3.67 04/14/2016         Menopausal hot flushes    thyroid function is normal, will increase premarin dose.  She has no lymphadenopathy on exam and no symptoms or history concerning for infection.  Lab Results  Component  Value Date   TSH 3.67 04/14/2016          Left carotid bruit    Noted on exam today.  Referral for carotid dopplers in process.       Relevant Orders   US Carotid Duplex Bilateral   T4 (Completed)   TSH (Completed)   Exertional dyspnea    Secondary to deconditioning,  Denies chest pain and claudication symptoms. Chest x ray normal.  Recommended starting a walking program once her hip pain has improved.        Other Visit Diagnoses    Skin blushing/flushing        Relevant Orders    T4 (Completed)    TSH (Completed)  Hip pain, left        Relevant Orders    DG HIP UNILAT WITH PELVIS 2-3 VIEWS RIGHT (Completed)    T4 (Completed)    TSH (Completed)    Dyspnea        Relevant Orders    DG Chest 2 View (Completed)    T4 (Completed)    TSH (Completed)       I have changed Emily Wu's PREMARIN to estrogens (conjugated). I am also having her maintain her multivitamin, bifidobacterium infantis, liothyronine, pantoprazole, atorvastatin, predniSONE, guaiFENesin-codeine, amitriptyline, and meloxicam.  Meds ordered this encounter  Medications  . estrogens, conjugated, (PREMARIN) 0.45 MG tablet    Sig: Take 1 tablet (0.45 mg total) by mouth daily. Take daily for 21 days then do not take for 7 days.    Dispense:  30 tablet    Refill:  1    FOR NEXT FILLTHANK YOU    Medications Discontinued During This Encounter  Medication Reason  . PREMARIN 0.3 MG tablet Reorder    Follow-up: No Follow-up on file.   Crecencio Mc, MD

## 2016-04-15 DIAGNOSIS — R0600 Dyspnea, unspecified: Secondary | ICD-10-CM | POA: Insufficient documentation

## 2016-04-15 DIAGNOSIS — R0609 Other forms of dyspnea: Secondary | ICD-10-CM | POA: Insufficient documentation

## 2016-04-15 DIAGNOSIS — M25559 Pain in unspecified hip: Secondary | ICD-10-CM | POA: Insufficient documentation

## 2016-04-15 LAB — T4: T4, Total: 5.4 ug/dL (ref 4.5–12.0)

## 2016-04-15 MED ORDER — ESTROGENS CONJUGATED 0.45 MG PO TABS: 0.4500 mg | ORAL_TABLET | Freq: Every day | ORAL | Status: DC

## 2016-04-15 NOTE — Assessment & Plan Note (Signed)

## 2016-04-15 NOTE — Assessment & Plan Note (Signed)
Thyroid function is WNL on current dose.  No current changes needed.   Lab Results  Component Value Date   TSH 3.67 04/14/2016

## 2016-04-15 NOTE — Assessment & Plan Note (Addendum)
Well controlled on current statin therapy by [facility] labs.   Liver enzymes were normal in February ,  no changes today.  Lab Results  Component Value Date   CHOL 180 01/03/2016   HDL 40.20 01/03/2016   LDLCALC 81 03/09/2012   LDLDIRECT 97.0 01/03/2016   TRIG 205.0* 01/03/2016   CHOLHDL 4 01/03/2016     Lab Results  Component Value Date   ALT 16 01/03/2016   AST 22 01/03/2016   ALKPHOS 68 01/03/2016   BILITOT 0.4 01/03/2016  \

## 2016-04-15 NOTE — Assessment & Plan Note (Signed)
She is due for follow up but requesting an alternative,  Cologuard discussed and accepted.

## 2016-04-15 NOTE — Assessment & Plan Note (Addendum)
Medial right,  With bruising of buttock noted on today's exam,  Hip and pelvic films were ordered to rule out fractures of hip and pelvis,  No fractures were appreciated but severe degenerative changes were noted at the hip joint and the lumbosacral level  .  PT evaluation accepted.

## 2016-04-15 NOTE — Assessment & Plan Note (Signed)
Noted on exam today.  Referral for carotid dopplers in process.

## 2016-04-15 NOTE — Assessment & Plan Note (Addendum)
Secondary to deconditioning,  Denies chest pain and claudication symptoms. Chest x ray normal.  Recommended starting a walking program once her hip pain has improved.

## 2016-04-15 NOTE — Assessment & Plan Note (Signed)
thyroid function is normal, will increase premarin dose.  She has no lymphadenopathy on exam and no symptoms or history concerning for infection.  Lab Results  Component Value Date   TSH 3.67 04/14/2016

## 2016-04-17 NOTE — Addendum Note (Signed)
Addended by: Crecencio Mc on: 04/17/2016 03:19 PM   Modules accepted: Orders, SmartSet

## 2016-05-13 ENCOUNTER — Ambulatory Visit: Payer: Medicare Other

## 2016-05-15 ENCOUNTER — Ambulatory Visit: Payer: Medicare Other | Attending: Internal Medicine | Admitting: Physical Therapy

## 2016-05-15 ENCOUNTER — Encounter: Payer: Self-pay | Admitting: Physical Therapy

## 2016-05-15 DIAGNOSIS — R262 Difficulty in walking, not elsewhere classified: Secondary | ICD-10-CM | POA: Diagnosis not present

## 2016-05-15 DIAGNOSIS — M6281 Muscle weakness (generalized): Secondary | ICD-10-CM | POA: Insufficient documentation

## 2016-05-15 NOTE — Therapy (Signed)
[facility] [address] [place], [address] Phone: [phone]   Fax:  [phone]  Physical Therapy Evaluation  Patient Details  Name: Emily Wu MRN: [id number] Date of Birth: 23-May-1931 Referring Provider: Crecencio Mc, MD  Encounter Date: 05/15/2016      PT End of Session - 05/15/16 1554    Visit Number 1   Number of Visits 4   Date for PT Re-Evaluation 06/12/16   Authorization Type 1   Authorization Time Period 10 G code   PT Start Time 1533   PT Stop Time 1615   PT Time Calculation (min) 42 min   Activity Tolerance Patient tolerated treatment well   Behavior During Therapy [facility] for tasks assessed/performed      Past Medical History  Diagnosis Date  . Hypertension   . High cholesterol   . Bruises easily   . Tendency toward bleeding easily ([place])   . Arthritis     "knees, shoulders, chest" (02/03/2014)  . Cervical cancer ([place]) 1965    Past Surgical History  Procedure Laterality Date  . Knee arthroscopy Left     [facility]  . Shoulder open rotator cuff repair Bilateral 2007,  2010    [facility]  . Abdominal hysterectomy  1965  . Appendectomy    . Cholecystectomy    . Throat surgery  2014    "arthritis hugh knot" (02/03/2014)    There were no vitals filed for this visit.       Subjective Assessment - 05/15/16 1548    Subjective Patient reports she is improving at the moment from recent fall in May. She has no pain in her hip and feels she has gotten back to her previous level of function. She does however see a difference in strength between her LE's when moving; right hip weaker than left. She also reports she has noticed she holds onto the front of her thighs when walking and this is new to her. She is careful when walking outdoors because she does not want to fall.   Pertinent History Reports she was planting outdoors in a pot. She bent over and fell backwards. She did initially have hip  and leg pain and was seen by her MD and had Xrays. Since seeing MD late May she has significantly improved and she feels she has returned to previous level of function.    Limitations Other (comment)  Patient reports she does not have any limitaitons at the moment.    Diagnostic tests X rays right hip   Patient Stated Goals To be able to continue to improve with home program   Currently in Pain? No/denies            [facility] PT Assessment - 05/15/16 1542    Assessment   Medical Diagnosis hip pain, acute, right M25.551   Referring Provider Crecencio Mc, MD   Onset Date/Surgical Date 04/11/16   Hand Dominance Right   Next MD Visit unknown   Prior Therapy none   Precautions   Precautions None   Restrictions   Weight Bearing Restrictions No   Balance Screen   Has the patient fallen in the past 6 months Yes   How many times? 1   Has the patient had a decrease in activity level because of a fear of falling?  No   Is the patient reluctant to leave their home because of a fear of falling?  No   Home  [place] residence   Living Arrangements Spouse/significant other   [place] entrance  from [place];Two level   Alternate Level Stairs-Number of Steps 13   Alternate Level Stairs-Rails Right  wall on opposite side   Prior Function   Level of Independence Independent   Leisure stays busy with grandchildren, going out and spending time with family,enjoys knitting, coloring, enjoys gardening outdoors   Cognition   Overall Cognitive Status Within Functional Limits for tasks assessed      Objective:  Gait: ambulating without AD into clinic: forward flexed posture at hips with decreased trunk rotation, hands intermittently place on front of thighs, knee varus noted bilaterally  AROM: LE's WNL's hips to ankles Strength; grossly tested: right hip flexion, abduction and ER 4-/5, right knee extension 4/5, flexion 5/5, ankle  DF and great toe extension 5/5; left LE 5/5 throughout major muscle groups Palpation: non tender right hip, LE   Outcome measure: LEFS 50/64; TUG 8.75 seconds (WNL); 5x sit to stand 12 seconds (WNL)  Treatment: Therapeutic Exercise: patient performed exercises with guidance, verbal and tactile cues and demonstration of PT: Sitting: hip adduction with glute sets with TrA contraction x 15 reps Sitting: hip abduction with green resistive band x 15 reps Sitting: SLR x 10 Sit to stand with ball between knees x 10 reps out of chair with pillow on chair to increase height Standing at high treatment table: walk forward/backwards and side stepping x 3 sets Supine: hooklying: assessed bridging and core control exercises patient was performing at home; modified double leg raise to be performed with hip/knee flexion with alternating LE's  Patient response to treatment: patient demonstrated good technique with all exercises following demonstration and with verbal cuing. Improved posture with decreased forward flexion and did not place hands on thighs to walk out of clinic at end of session         PT Education - 05/15/16 1635    Education provided Yes   Education Details HEP: hip adduction/abduction, standing walk forward, backward and side stepping, sit to stand with ball between knees, SLR sitting and lying   Person(s) Educated Patient   Methods Explanation;Demonstration;Verbal cues;Handout   Comprehension Verbalized understanding;Returned demonstration;Verbal cues required             PT Long Term Goals - 05/15/16 1649    PT LONG TERM GOAL #1   Title Patient will demonstrate independence with home exercises for strengthening and balance to prevent future falls and improve confidence with walking on level, uneven surfaces by 06/12/2016   Baseline requires verbal cuing and demonstration to perform exercises correctly    Status New   PT LONG TERM GOAL #2   Title Patient will be able to walk  without holding onto front of thighs with improved posture with decreased forward trunk lean to improve community ambulation, decrease fall risk by 06/12/2016   Baseline patient holds onto both thighs when ambulating on level surfaces and stairs   Status New   PT LONG TERM GOAL #3   Title Patient will demonstrate improved funciton with daily tasks with LEFS score of 60 or better by 06/12/2016   Baseline LEFS 50/64   Status New               Plan - 05/15/16 1639    Clinical Impression Statement Patient is an 82 year old right hand dominant female who presents in clinic today s/p fall about a month  ago,  landing on her right hip region. She has limitations of decreased strength rigth LE hip with flexion, abduction, ER as compared to left LE and has limited knowledge of appropriate exercises to perform in order to improve  strength equal to left LE. She has decreased confidence with walking on uneven surfaces outside and holds onto front of thighs to stabilize during walking on even and uneven surfaces. She will benefit from physical therapy  intervention to improve strength and balance and be independent with home program of appropriate exercises to return to full function with walking and daily tasks without difficulty.    Rehab Potential Good   Clinical Impairments Affecting Rehab Potential (+) motivated, improving since fall (-) age, co morbidities: previous hip pain, weakness, knee surgery   PT Frequency 1x / week   PT Duration 4 weeks   PT Treatment/Interventions Therapeutic exercise;Neuromuscular re-education;Manual techniques;Balance training;Patient/family education;Moist Heat   PT Next Visit Plan Re assess strength, home program and progress with diagonal wedding march, walk on balance stones, balance beam   PT Home Exercise Plan hip adduction, glute squeeze, hip abduction with resistive band, sit to stand with ball between knees, bridging with ball between knees, standing walk  forward/backward and side stepping   Consulted and Agree with Plan of Care Patient      Patient will benefit from skilled therapeutic intervention in order to improve the following deficits and impairments:  Decreased strength, Decreased endurance, Difficulty walking  Visit Diagnosis: Difficulty in walking, not elsewhere classified - Plan: PT plan of care cert/re-cert  Muscle weakness (generalized) - Plan: PT plan of care cert/re-cert      G-Codes - [id number]    Functional Assessment Tool Used LEFS, TUG, 5x sit to stand, ROM, strength, clinical judgment   Functional Limitation Mobility: Walking and moving around   Mobility: Walking and Moving Around Current Status [id number]) At least 1 percent but less than 20 percent impaired, limited or restricted   Mobility: Walking and Moving Around Goal Status [id number]) 0 percent impaired, limited or restricted       Problem List Patient Active Problem List   Diagnosis Date Noted  . Exertional dyspnea 04/15/2016  . Hip pain, acute 04/15/2016  . Left carotid bruit 04/14/2016  . Menopausal hot flushes 08/07/2015  . H/O malignant neoplasm of skin 06/25/2015  . Hypothyroidism 01/31/2015  . History of cervical cancer 01/10/2015  . Screening for osteoporosis 01/10/2015  . Chest pain 02/03/2014  . Dyspepsia 02/03/2014  . Hyperlipidemia 02/03/2014  . Medicare annual wellness visit, subsequent 01/19/2014  . History of IBS 01/19/2014  . S/P breast augmentation 01/19/2014  . Hyperlipidemia with target LDL less than 100 02/01/2012  . Colon polyps 01/30/2012  . Low back pain potentially associated with radiculopathy 01/30/2012  . Recurrent colitis due to Clostridium difficile 01/19/2011    Class: History of    Jomarie Longs PT 05/15/2016, 8:14 PM  [facility] [address] [place], [address] Phone: [phone]   Fax:  [phone]  Name: Emily Wu MRN:  [id number] Date of Birth: 1931/10/10

## 2016-05-22 ENCOUNTER — Ambulatory Visit: Payer: Medicare Other | Admitting: Physical Therapy

## 2016-05-22 ENCOUNTER — Encounter: Payer: Self-pay | Admitting: Physical Therapy

## 2016-05-22 ENCOUNTER — Telehealth: Payer: Self-pay | Admitting: Internal Medicine

## 2016-05-22 DIAGNOSIS — M6281 Muscle weakness (generalized): Secondary | ICD-10-CM

## 2016-05-22 DIAGNOSIS — R262 Difficulty in walking, not elsewhere classified: Secondary | ICD-10-CM

## 2016-05-22 NOTE — Telephone Encounter (Signed)
I attempted to call patient back, left a VM.  I told her she can call back regarding cologuard screening and I would follow up if a xray was needed.  I have mailed a copy of the labs to her. thanks

## 2016-05-22 NOTE — Telephone Encounter (Signed)
Pt called wanting to know if she needs a Rx to get the cologurard? And pt wants to get a copy of her lab results from date 04/14/16.   Pt wanted to know does she need to get a xray on her right knee?   Call pt @ [phone]. If pt not home please leave msg. Thank you!

## 2016-05-22 NOTE — Therapy (Signed)
[facility] [address] [place], [address] Phone: [phone]   Fax:  [phone]  Physical Therapy Treatment/Discharge Summary  Patient Details  Name: Emily Wu MRN: [id number] Date of Birth: Sep 22, 1931 Referring Provider: Crecencio Mc, MD  Encounter Date: 05/22/2016   Patient has attended 2 sessions of physical through 05/22/2016 and achieved all goals. Plan: discharge from physical therapy to independent home program and self management      PT End of Session - 05/22/16 1630    Visit Number 2   Number of Visits 4   Date for PT Re-Evaluation 06/12/16   Authorization Type 2   Authorization Time Period 10 G code   PT Start Time [id number]   PT Stop Time 1616   PT Time Calculation (min) 41 min   Activity Tolerance Patient tolerated treatment well   Behavior During Therapy WFL for tasks assessed/performed      Past Medical History  Diagnosis Date  . Hypertension   . High cholesterol   . Bruises easily   . Tendency toward bleeding easily ([place])   . Arthritis     "knees, shoulders, chest" (02/03/2014)  . Cervical cancer ([place]) 1965    Past Surgical History  Procedure Laterality Date  . Knee arthroscopy Left     [facility]  . Shoulder open rotator cuff repair Bilateral 2007,  2010    [facility]  . Abdominal hysterectomy  1965  . Appendectomy    . Cholecystectomy    . Throat surgery  2014    "arthritis hugh knot" (02/03/2014)    There were no vitals filed for this visit.      Subjective Assessment - 05/22/16 1540    Subjective Doing exercises with lasting results. does not have to place hands on legs to walk today. Patient reports she is a great deal better and is now able to continue on own with home exercise program and agrees to discharge from physical therapy.    Patient Stated Goals To be able to continue to improve with home program   Currently in Pain? No/denies        Objective:  Gait:  ambulating without AD into clinic: minimal forward flexed posture at hips with decreased trunk rotation, knee varus noted bilaterally, more erect posture, not placing hand on thigh today  AROM: LE's WNL's hips to ankles Strength; grossly tested: right hip flexion, abduction and ER 4/5, right knee extension 4/5, flexion 5/5,  left LE 5/5 throughout major muscle groups Palpation: non tender right hip, LE   Outcome measure: LEFS 60/64; TUG 8.75 seconds (WNL); 5x sit to stand 12 seconds (WNL)  Treatment: Therapeutic Exercise: patient performed exercises with guidance, verbal and tactile cues and demonstration of PT: Sitting: hip adduction with glute sets with TrA contraction x 15 reps Sitting: hip abduction with green resistive band x 15 reps Sit to stand with resistive band around thighs x 10 reps off high treatment table (22") Standing at counter: walk with resistive band around thighs x 3 sets 4 steps to right and left Standing at counter: resistive band around thighs: hip extension and abduction each LE 5x, 3 sets Walking: against resistive band forward and backwards x 3-5 steps each direction with close supervision for safety Diagonal wedding march holding (2) 2# weights   Patient response to treatment: patient demonstrated good technique with all exercises following demonstration and with minimal to no verbal cuing. Improved posture with decreased forward flexion and  did not place hands on thighs to walk throughout session          PT Long Term Goals - 06/14/2016 1633    PT LONG TERM GOAL #1   Title Patient will demonstrate independence with home exercises for strengthening and balance to prevent future falls and improve confidence with walking on level, uneven surfaces by 06/12/2016   Baseline requires verbal cuing and demonstration to perform exercises correctly    Status Achieved   PT LONG TERM GOAL #2   Title Patient will be able to walk without holding onto front of thighs with  improved posture with decreased forward trunk lean to improve community ambulation, decrease fall risk by 06/12/2016   Baseline patient holds onto both thighs when ambulating on level surfaces and stairs   Status Achieved   PT LONG TERM GOAL #3   Title Patient will demonstrate improved funciton with daily tasks with LEFS score of 60 or better by 06/12/2016   Baseline LEFS 50/64   Status Achieved               Plan - 14-Jun-2016 1630    Clinical Impression Statement Patient has achieved all goasl for being independent with home  exercise program and has had not pain in her right hip since previous session. She is ow able to walk with less difficulty and more erect posture. She is ready for dishcarge with LEFS score of 60/80 and should continue to improve with independent self management /home exercise program.    Rehab Potential Good   PT Frequency 1x / week   PT Duration 4 weeks   PT Next Visit Plan discharge from physical therapy   PT Home Exercise Plan as instructed in patient education   Consulted and Agree with Plan of Care Patient      Patient will benefit from skilled therapeutic intervention in order to improve the following deficits and impairments:  Decreased strength, Decreased endurance, Difficulty walking  Visit Diagnosis: Difficulty in walking, not elsewhere classified  Muscle weakness (generalized)       G-Codes - 06/14/16 1635    Functional Assessment Tool Used LEFS, TUG, 5x sit to stand, ROM, strength, clinical judgment   Functional Limitation Mobility: Walking and moving around   Mobility: Walking and Moving Around Goal Status [phone]) 0 percent impaired, limited or restricted   Mobility: Walking and Moving Around Discharge Status [phone]) 0 percent impaired, limited or restricted      Problem List Patient Active Problem List   Diagnosis Date Noted  . Exertional dyspnea 04/15/2016  . Hip pain, acute 04/15/2016  . Left carotid bruit 04/14/2016  .  Menopausal hot flushes 08/07/2015  . H/O malignant neoplasm of skin 06/25/2015  . Hypothyroidism 01/31/2015  . History of cervical cancer 01/10/2015  . Screening for osteoporosis 01/10/2015  . Chest pain 02/03/2014  . Dyspepsia 02/03/2014  . Hyperlipidemia 02/03/2014  . Medicare annual wellness visit, subsequent 01/19/2014  . History of IBS 01/19/2014  . S/P breast augmentation 01/19/2014  . Hyperlipidemia with target LDL less than 100 02/01/2012  . Colon polyps 01/30/2012  . Low back pain potentially associated with radiculopathy 01/30/2012  . Recurrent colitis due to Clostridium difficile 01/19/2011    Class: History of    Jomarie Longs PT 05/23/2016, 10:07 AM  [facility] [address] [place], [address] Phone: [phone]   Fax:  [phone]  Name: Emily Wu MRN: [id number] Date of Birth:  11/19/1931    

## 2016-05-22 NOTE — Telephone Encounter (Signed)
Please order cologuard.   I did not discuss knee pain with her   Only hip and buttock pain.  Would need to be seen for knee bc I do not recall discussing knee/

## 2016-05-23 NOTE — Telephone Encounter (Signed)
No, thanks 

## 2016-05-23 NOTE — Telephone Encounter (Signed)
I scheduled Emily Wu for Monday afternoon since the pain inher knees is bothering her som much would you want her to come in today for X-ray? Cologuard ordered.

## 2016-05-26 ENCOUNTER — Ambulatory Visit (INDEPENDENT_AMBULATORY_CARE_PROVIDER_SITE_OTHER): Payer: Medicare Other | Admitting: Internal Medicine

## 2016-05-26 ENCOUNTER — Encounter: Payer: Self-pay | Admitting: Internal Medicine

## 2016-05-26 VITALS — BP 148/74 | HR 67 | Temp 97.8°F | Resp 12 | Ht 63.0 in | Wt 131.2 lb

## 2016-05-26 DIAGNOSIS — N951 Menopausal and female climacteric states: Secondary | ICD-10-CM | POA: Diagnosis not present

## 2016-05-26 DIAGNOSIS — H25813 Combined forms of age-related cataract, bilateral: Secondary | ICD-10-CM | POA: Diagnosis not present

## 2016-05-26 DIAGNOSIS — T1512XA Foreign body in conjunctival sac, left eye, initial encounter: Secondary | ICD-10-CM | POA: Diagnosis not present

## 2016-05-26 DIAGNOSIS — E038 Other specified hypothyroidism: Secondary | ICD-10-CM

## 2016-05-26 DIAGNOSIS — M25561 Pain in right knee: Secondary | ICD-10-CM

## 2016-05-26 DIAGNOSIS — M25562 Pain in left knee: Secondary | ICD-10-CM | POA: Diagnosis not present

## 2016-05-26 DIAGNOSIS — E034 Atrophy of thyroid (acquired): Secondary | ICD-10-CM

## 2016-05-26 MED ORDER — ESTROGENS CONJUGATED 0.625 MG PO TABS: 0.6250 mg | ORAL_TABLET | Freq: Every day | ORAL | Status: DC

## 2016-05-26 NOTE — Progress Notes (Signed)
Subjective:  Patient ID: Emily Wu, female    DOB: 11-15-1931  Age: 82 y.o. MRN: [id number]  CC: The primary encounter diagnosis was Arthralgia of both knees. Diagnoses of Knee pain, bilateral, Hypothyroidism due to acquired atrophy of thyroid, and Menopausal hot flushes were also pertinent to this visit. Of bilateral  HPI Emily Wu presents for evaluation  of bilateral knee pain which started after her fall at home on May 18th . She fell after losing balance when she bent over to pick up a flower pot.  After the fall she noticed that the muscle on her medial inner thigh on the right left was very tight and tender.  She was referred to PT for stretching and strengthening, and has been taking meloxicam 15 mg daily and using tylenol 500 mg  Once daily.   Had therapy twice thus far, for strengthening thighs. She was increasingly sore after the PT and has discontinued it. She reports now that her primary pain is behind both knees  And is aggravated by lying supine.   History of left knee surgery by [place] at [facility] remotely  (2002)  Wants to see him  Need be.   2) Menopause"  Her  hot flashes started a few years ago,  Have been worse lately for the last several weeks and she reports drenching sweats from the ears down.  currenlty taking 0.45 mg of Premarin daily      Outpatient Prescriptions Prior to Visit  Medication Sig Dispense Refill  . amitriptyline (ELAVIL) 25 MG tablet TAKE ONE TABLET BY MOUTH EVERY DAY 90 tablet 1  . atorvastatin (LIPITOR) 20 MG tablet TAKE ONE TABLET EVERY DAY 90 tablet 2  . bifidobacterium infantis (ALIGN) capsule Take 1 capsule by mouth daily. 90 capsule 1  . liothyronine (CYTOMEL) 5 MCG tablet TAKE TWO TABLETS EVERY DAY 180 tablet 3  . Multiple Vitamin (MULTIVITAMIN) tablet Take 1 tablet by mouth daily.    . pantoprazole (PROTONIX) 40 MG tablet TAKE ONE TABLET BY MOUTH EVERY DAY 90 tablet 2  . estrogens, conjugated, (PREMARIN) 0.45 MG tablet Take 1 tablet  (0.45 mg total) by mouth daily. Take daily for 21 days then do not take for 7 days. 30 tablet 1  . meloxicam (MOBIC) 15 MG tablet TAKE ONE TABLET EVERY DAY NEED TO SCHEDULE APPOINTMENT IN FEBRUARY 90 tablet 1  . guaiFENesin-codeine (ROBITUSSIN AC) 100-10 MG/5ML syrup Take 5 mLs by mouth 3 (three) times daily as needed for cough. (Patient not taking: Reported on 05/26/2016) 120 mL 0  . predniSONE (DELTASONE) 50 MG tablet 1 tablet daily x 5 days. (Patient not taking: Reported on 04/14/2016) 5 tablet 0   No facility-administered medications prior to visit.    Review of Systems;  Patient denies headache, fevers, malaise, unintentional weight loss, skin rash, eye pain, sinus congestion and sinus pain, sore throat, dysphagia,  hemoptysis , cough, dyspnea, wheezing, chest pain, palpitations, orthopnea, edema, abdominal pain, nausea, melena, diarrhea, constipation, flank pain, dysuria, hematuria, urinary  Frequency, nocturia, numbness, tingling, seizures,  Focal weakness, Loss of consciousness,  Tremor, insomnia, depression, anxiety, and suicidal ideation.      Objective:  BP 148/74 mmHg  Pulse 67  Temp(Src) 97.8 F (36.6 C) (Oral)  Resp 12  Ht 5\' 3"  (1.6 m)  Wt 131 lb 4 oz (59.535 kg)  BMI 23.26 kg/m2  SpO2 97%  BP Readings from Last 3 Encounters:  05/26/16 148/74  04/14/16 138/66  02/21/16 102/64    Wt  Readings from Last 3 Encounters:  05/26/16 131 lb 4 oz (59.535 kg)  04/14/16 132 lb (59.875 kg)  02/21/16 130 lb (58.968 kg)    General appearance: alert, cooperative and appears stated age Ears: normal TM's and external ear canals both ears Throat: lips, mucosa, and tongue normal; teeth and gums normal Neck: no adenopathy, no carotid bruit, supple, symmetrical, trachea midline and thyroid not enlarged, symmetric, no tenderness/mass/nodules Back: symmetric, no curvature. ROM normal. No CVA tenderness. Lungs: clear to auscultation bilaterally Heart: regular rate and rhythm, S1, S2  normal, no murmur, click, rub or gallop Abdomen: soft, non-tender; bowel sounds normal; no masses,  no organomegaly Pulses: 2+ and symmetric Skin: Skin color, texture, turgor normal. No rashes or lesions Lymph nodes: Cervical, supraclavicular, and axillary nodes normal. MSK: bilateral crepitus  No effusions , no popliteal masses.  Negative Homan's sign.   No results found for: HGBA1C  Lab Results  Component Value Date   CREATININE 1.07 01/03/2016   CREATININE 1.10 06/07/2015   CREATININE 1.07 03/15/2015    Lab Results  Component Value Date   WBC 6.8 08/06/2015   HGB 12.8 08/06/2015   HCT 38.2 08/06/2015   PLT 248.0 08/06/2015   GLUCOSE 92 01/03/2016   CHOL 180 01/03/2016   TRIG 205.0* 01/03/2016   HDL 40.20 01/03/2016   LDLDIRECT 97.0 01/03/2016   LDLCALC 81 03/09/2012   ALT 16 01/03/2016   AST 22 01/03/2016   NA 141 01/03/2016   K 3.8 01/03/2016   CL 104 01/03/2016   CREATININE 1.07 01/03/2016   BUN 18 01/03/2016   CO2 31 01/03/2016   TSH 3.67 04/14/2016    Dg Chest 2 View  04/14/2016  CLINICAL DATA:  Shortness of breath. EXAM: CHEST  2 VIEW COMPARISON:  02/03/2014 FINDINGS: Heart and mediastinal contours are within normal limits. No focal opacities or effusions. No acute bony abnormality. IMPRESSION: No active cardiopulmonary disease. Electronically Signed   By: Rolm Baptise M.D.   On: 04/14/2016 16:44   Dg Hip Unilat With Pelvis 2-3 Views Right  04/14/2016  CLINICAL DATA:  82 year old female with acute right hip pain following fall 3 days ago. Initial encounter. EXAM: DG HIP (WITH OR WITHOUT PELVIS) 2-3V RIGHT COMPARISON:  01/29/2011 abdominal/pelvic CT. FINDINGS: There is no evidence of acute fracture, subluxation or dislocation. Moderate degenerative changes in both hips and severe degenerative changes at L5-S1 noted. No focal bony lesions are noted. IMPRESSION: No evidence of acute bony abnormality. Moderate degenerative changes within both hips and severe  degenerative changes at L5-S1. Electronically Signed   By: Margarette Canada M.D.   On: 04/14/2016 16:44    Assessment & Plan:   Problem List Items Addressed This Visit    Hypothyroidism   Menopausal hot flushes    i have increased her dose of premarin  to 0.625  Mg daily       Knee pain, bilateral    She has no history of recent trauma and exam is consistent with OCA/DJD.  Pain is persist net despite daily use of meloxicam and tylenol and Orthopedics referral is requested.  Referral in process to [facility]       Other Visit Diagnoses    Arthralgia of both knees    -  Primary    Relevant Orders    Ambulatory referral to Orthopedic Surgery       I have discontinued Ms. Vastine's predniSONE and guaiFENesin-codeine. I have also changed her estrogens (conjugated). Additionally, I am having her maintain  her multivitamin, bifidobacterium infantis, liothyronine, pantoprazole, atorvastatin, amitriptyline, and meloxicam.  Meds ordered this encounter  Medications  . estrogens, conjugated, (PREMARIN) 0.625 MG tablet    Sig: Take 1 tablet (0.625 mg total) by mouth daily.    Dispense:  30 tablet    Refill:  5    NOTE DOSE CHANGE    Medications Discontinued During This Encounter  Medication Reason  . guaiFENesin-codeine (ROBITUSSIN AC) 100-10 MG/5ML syrup   . predniSONE (DELTASONE) 50 MG tablet   . estrogens, conjugated, (PREMARIN) 0.45 MG tablet Reorder    Follow-up: No Follow-up on file.   Crecencio Mc, MD

## 2016-05-26 NOTE — Progress Notes (Signed)
Pre-visit discussion using our clinic review tool. No additional management support is needed unless otherwise documented below in the visit note.  

## 2016-05-26 NOTE — Patient Instructions (Signed)
I am making a referral to Dr Para March at [facility] for your knee pain .   Continue meloxicam, and you can increase the tylenol to 4 tablets daily   I have increased your Premarin to 0.625 mg every day for your hot flushes

## 2016-05-26 NOTE — Telephone Encounter (Signed)
Noted  

## 2016-05-27 DIAGNOSIS — M25561 Pain in right knee: Secondary | ICD-10-CM | POA: Insufficient documentation

## 2016-05-27 DIAGNOSIS — G8929 Other chronic pain: Secondary | ICD-10-CM | POA: Insufficient documentation

## 2016-05-27 DIAGNOSIS — M25562 Pain in left knee: Secondary | ICD-10-CM | POA: Insufficient documentation

## 2016-05-27 NOTE — Assessment & Plan Note (Deleted)
i have increased her dose of premarin  to 0.625  Mg daily

## 2016-05-27 NOTE — Assessment & Plan Note (Signed)
She has no history of recent trauma and exam is consistent with OCA/DJD.  Pain is persist net despite daily use of meloxicam and tylenol and Orthopedics referral is requested.  Referral in process to [facility]

## 2016-05-27 NOTE — Assessment & Plan Note (Signed)
i have increased her dose of premarin  to 0.625  Mg daily

## 2016-05-28 ENCOUNTER — Encounter: Payer: Self-pay | Admitting: Internal Medicine

## 2016-06-09 DIAGNOSIS — M25562 Pain in left knee: Secondary | ICD-10-CM | POA: Diagnosis not present

## 2016-06-09 DIAGNOSIS — G8929 Other chronic pain: Secondary | ICD-10-CM | POA: Diagnosis not present

## 2016-06-09 DIAGNOSIS — M170 Bilateral primary osteoarthritis of knee: Secondary | ICD-10-CM | POA: Diagnosis not present

## 2016-06-09 DIAGNOSIS — M25561 Pain in right knee: Secondary | ICD-10-CM | POA: Diagnosis not present

## 2016-06-09 DIAGNOSIS — M1712 Unilateral primary osteoarthritis, left knee: Secondary | ICD-10-CM | POA: Diagnosis not present

## 2016-06-16 ENCOUNTER — Other Ambulatory Visit: Payer: Self-pay | Admitting: Internal Medicine

## 2016-08-10 LAB — HM COLONOSCOPY

## 2016-08-18 ENCOUNTER — Ambulatory Visit: Payer: Medicare Other | Admitting: Family

## 2016-08-18 ENCOUNTER — Telehealth: Payer: Self-pay | Admitting: Internal Medicine

## 2016-08-18 ENCOUNTER — Encounter: Payer: Self-pay | Admitting: Family

## 2016-08-18 NOTE — Progress Notes (Deleted)
Subjective:    Patient ID: Emily Wu, female    DOB: September 08, 1931, 82 y.o.   MRN: [id number]  CC: Emily Wu is a 82 y.o. female who presents today for an acute visit.    HPI: Patient here for acute visit chief complaint of nails in bad shape after gel manicure.    HISTORY:  Past Medical History:  Diagnosis Date  . Arthritis    "knees, shoulders, chest" (02/03/2014)  . Bruises easily   . Cervical cancer ([place]) 1965  . High cholesterol   . Hypertension   . Tendency toward bleeding easily [facility])    Past Surgical History:  Procedure Laterality Date  . ABDOMINAL HYSTERECTOMY  1965  . APPENDECTOMY    . CHOLECYSTECTOMY    . KNEE ARTHROSCOPY Left    [facility]  . SHOULDER OPEN ROTATOR CUFF REPAIR Bilateral 2007,  2010   [facility]  . THROAT SURGERY  2014   "arthritis hugh knot" (02/03/2014)   Family History  Problem Relation Age of Onset  . Peripheral vascular disease Mother   . Stroke Father   . Hyperlipidemia Father   . Heart disease Father 61  . Cancer Neg Hx     Allergies: Tramadol; Amoxicillin; Promethazine hcl; and Sulfa drugs cross reactors Current Outpatient Prescriptions on File Prior to Visit  Medication Sig Dispense Refill  . amitriptyline (ELAVIL) 25 MG tablet TAKE ONE TABLET BY MOUTH EVERY DAY 90 tablet 1  . atorvastatin (LIPITOR) 20 MG tablet TAKE ONE TABLET EVERY DAY 90 tablet 2  . bifidobacterium infantis (ALIGN) capsule Take 1 capsule by mouth daily. 90 capsule 1  . estrogens, conjugated, (PREMARIN) 0.625 MG tablet Take 1 tablet (0.625 mg total) by mouth daily. 30 tablet 5  . liothyronine (CYTOMEL) 5 MCG tablet TAKE TWO TABLETS EVERY DAY 180 tablet 3  . meloxicam (MOBIC) 15 MG tablet TAKE ONE TABLET EVERY DAY NEED TO SCHEDULE APPOINTMENT IN FEBRUARY 90 tablet 1  . meloxicam (MOBIC) 15 MG tablet TAKE ONE TABLET EVERY DAY NEED TO SCHEDULE APPOINTMENT IN FEBRUARY 90 tablet 0  . Multiple Vitamin (MULTIVITAMIN) tablet Take 1 tablet by mouth daily.    .  pantoprazole (PROTONIX) 40 MG tablet TAKE ONE TABLET BY MOUTH EVERY DAY 90 tablet 2   No current facility-administered medications on file prior to visit.     Social History  Substance Use Topics  . Smoking status: Former Smoker    Packs/day: 2.00    Years: 7.00    Types: Cigarettes  . Smokeless tobacco: Never Used     Comment: 02/03/2014 "quit smoking in the 1960's"  . Alcohol use Yes     Comment: 02/03/2014 "glass of wine a couple times/yr"    Review of Systems  Constitutional: Negative for chills and fever.  Respiratory: Negative for cough.   Cardiovascular: Negative for chest pain and palpitations.  Gastrointestinal: Negative for nausea and vomiting.      Objective:    There were no vitals taken for this visit.   Physical Exam  Constitutional: She appears well-developed and well-nourished.  Eyes: Conjunctivae are normal.  Cardiovascular: Normal rate, regular rhythm, normal heart sounds and normal pulses.   Pulmonary/Chest: Effort normal and breath sounds normal. She has no wheezes. She has no rhonchi. She has no rales.  Neurological: She is alert.  Skin: Skin is warm and dry.  Psychiatric: She has a normal mood and affect. Her speech is normal and behavior is normal. Thought content normal.  Vitals reviewed.  Assessment & Plan:      I am having Emily Wu maintain her multivitamin, bifidobacterium infantis, liothyronine, pantoprazole, atorvastatin, amitriptyline, meloxicam, estrogens (conjugated), and meloxicam.   No orders of the defined types were placed in this encounter.   Return precautions given.   Risks, benefits, and alternatives of the medications and treatment plan prescribed today were discussed, and patient expressed understanding.   Education regarding symptom management and diagnosis given to patient on AVS.  Continue to follow with TULLO, Aris Everts, MD for routine health maintenance.   Emily Wu and I agreed with plan.    Emily Paris, FNP

## 2016-08-18 NOTE — Telephone Encounter (Signed)
Pt came in thinking she had an appt for 7pm instead of 7am. Pt appt was at 7am. I rescheduled pt appt to 08/20/16.

## 2016-08-20 ENCOUNTER — Ambulatory Visit: Payer: Medicare Other | Admitting: Family

## 2016-08-22 ENCOUNTER — Ambulatory Visit: Payer: Medicare Other | Admitting: Family Medicine

## 2016-09-02 ENCOUNTER — Other Ambulatory Visit: Payer: Self-pay | Admitting: Internal Medicine

## 2016-09-11 DIAGNOSIS — M170 Bilateral primary osteoarthritis of knee: Secondary | ICD-10-CM | POA: Diagnosis not present

## 2016-09-15 ENCOUNTER — Other Ambulatory Visit: Payer: Self-pay | Admitting: Internal Medicine

## 2016-09-18 DIAGNOSIS — Z85828 Personal history of other malignant neoplasm of skin: Secondary | ICD-10-CM | POA: Diagnosis not present

## 2016-09-18 DIAGNOSIS — Z08 Encounter for follow-up examination after completed treatment for malignant neoplasm: Secondary | ICD-10-CM | POA: Diagnosis not present

## 2016-09-18 DIAGNOSIS — L821 Other seborrheic keratosis: Secondary | ICD-10-CM | POA: Diagnosis not present

## 2016-10-13 DIAGNOSIS — M25562 Pain in left knee: Secondary | ICD-10-CM | POA: Diagnosis not present

## 2016-10-13 DIAGNOSIS — G8929 Other chronic pain: Secondary | ICD-10-CM | POA: Diagnosis not present

## 2016-10-13 DIAGNOSIS — M1711 Unilateral primary osteoarthritis, right knee: Secondary | ICD-10-CM | POA: Insufficient documentation

## 2016-10-21 ENCOUNTER — Other Ambulatory Visit: Payer: Self-pay | Admitting: Internal Medicine

## 2016-10-25 ENCOUNTER — Other Ambulatory Visit: Payer: Self-pay | Admitting: Internal Medicine

## 2016-11-06 ENCOUNTER — Emergency Department
Admission: EM | Admit: 2016-11-06 | Discharge: 2016-11-06 | Disposition: A | Discharge status: Home / Self Care | Payer: Medicare Other | Attending: Student in an Organized Health Care Education/Training Program | Admitting: Student in an Organized Health Care Education/Training Program

## 2016-11-06 ENCOUNTER — Emergency Department: Payer: Medicare Other

## 2016-11-06 DIAGNOSIS — Z8541 Personal history of malignant neoplasm of cervix uteri: Secondary | ICD-10-CM | POA: Diagnosis not present

## 2016-11-06 DIAGNOSIS — Z79899 Other long term (current) drug therapy: Secondary | ICD-10-CM | POA: Insufficient documentation

## 2016-11-06 DIAGNOSIS — R197 Diarrhea, unspecified: Secondary | ICD-10-CM | POA: Diagnosis not present

## 2016-11-06 DIAGNOSIS — R112 Nausea with vomiting, unspecified: Secondary | ICD-10-CM | POA: Insufficient documentation

## 2016-11-06 DIAGNOSIS — I10 Essential (primary) hypertension: Secondary | ICD-10-CM | POA: Insufficient documentation

## 2016-11-06 DIAGNOSIS — Z87891 Personal history of nicotine dependence: Secondary | ICD-10-CM | POA: Insufficient documentation

## 2016-11-06 LAB — COMPREHENSIVE METABOLIC PANEL
ALT: 20 U/L (ref 14–54)
AST: 35 U/L (ref 15–41)
Albumin: 3.8 g/dL (ref 3.5–5.0)
Alkaline Phosphatase: 57 U/L (ref 38–126)
Anion gap: 9 (ref 5–15)
BUN: 18 mg/dL (ref 6–20)
CO2: 23 mmol/L (ref 22–32)
Calcium: 9.1 mg/dL (ref 8.9–10.3)
Chloride: 106 mmol/L (ref 101–111)
Creatinine, Ser: 0.96 mg/dL (ref 0.44–1.00)
GFR calc Af Amer: >60 mL/min (ref >60)
GFR calc non Af Amer: 53 mL/min — ABNORMAL LOW (ref >60)
Glucose, Bld: 161 mg/dL — ABNORMAL HIGH (ref 65–99)
Potassium: 3.3 mmol/L — ABNORMAL LOW (ref 3.5–5.1)
Sodium: 138 mmol/L (ref 135–145)
Total Bilirubin: 0.7 mg/dL (ref 0.3–1.2)
Total Protein: 7.1 g/dL (ref 6.5–8.1)

## 2016-11-06 LAB — CBC
HCT: 43.4 % (ref 35.0–47.0)
Hemoglobin: 15 g/dL (ref 12.0–16.0)
MCH: 31.8 pg (ref 26.0–34.0)
MCHC: 34.5 g/dL (ref 32.0–36.0)
MCV: 92.1 fL (ref 80.0–100.0)
Platelets: 239 10*3/uL (ref 150–440)
RBC: 4.71 MIL/uL (ref 3.80–5.20)
RDW: 13.5 % (ref 11.5–14.5)
WBC: 8.2 10*3/uL (ref 3.6–11.0)

## 2016-11-06 LAB — LIPASE, BLOOD: Lipase: 18 U/L (ref 11–51)

## 2016-11-06 MED ORDER — LOPERAMIDE HCL 2 MG PO CAPS
2.0000 mg | ORAL_CAPSULE | Freq: Once | ORAL | Status: AC
  Administered 2016-11-06: 2 mg via ORAL
  Filled 2016-11-06: qty 1

## 2016-11-06 MED ORDER — SODIUM CHLORIDE 0.9 % IV BOLUS (SEPSIS)
1000.0000 mL | Freq: Once | INTRAVENOUS | Status: AC
  Administered 2016-11-06: 1000 mL via INTRAVENOUS

## 2016-11-06 MED ORDER — ONDANSETRON HCL 4 MG/2ML IJ SOLN
4.0000 mg | Freq: Once | INTRAMUSCULAR | Status: AC
  Administered 2016-11-06: 4 mg via INTRAVENOUS
  Filled 2016-11-06: qty 2

## 2016-11-06 MED ORDER — ONDANSETRON HCL 4 MG PO TABS: 4.0000 mg | ORAL_TABLET | Freq: Every day | ORAL | 0 refills | Status: AC | PRN

## 2016-11-06 NOTE — ED Notes (Signed)
Patient denies current nausea

## 2016-11-06 NOTE — ED Notes (Signed)
Reviewed d/c instructions, follow-up care and prescription with patient. Pt verbalized understanding.  

## 2016-11-06 NOTE — ED Notes (Signed)
MD Robinson at bedside 

## 2016-11-06 NOTE — ED Triage Notes (Signed)
Per EMS: Pt c/o N/V/D. Pt's last solid BM on Sunday. Pt has hx of Cdif (1988)  Pt reports she fell of toilet yesterday and c/o back and bilateral knee.

## 2016-11-06 NOTE — ED Provider Notes (Signed)
[facility] Emergency Department Provider Note    First MD Initiated Contact with Patient 11/06/16 0315     (approximate)  I have reviewed the triage vital signs and the nursing notes.   HISTORY  Chief Complaint Emesis and Diarrhea    HPI Emily Wu is a 82 y.o. female  who presents with chief complaint of nausea vomiting diarrhea or 4 days. No measured fevers. Patient denies any blood in her bowel movements. No dysuria. No flank pain. Denies any abdominal pain at this time. States she's been unable to tolerate any oral hydration for the past 12 hours. Denies any chest pain or shortness of breath. No cough.   Past Medical History:  Diagnosis Date  . Arthritis    "knees, shoulders, chest" (02/03/2014)  . Bruises easily   . Cervical cancer ([place]) 1965  . High cholesterol   . Hypertension   . Tendency toward bleeding easily [facility])    Family History  Problem Relation Age of Onset  . Peripheral vascular disease Mother   . Stroke Father   . Hyperlipidemia Father   . Heart disease Father 58  . Cancer Neg Hx    Past Surgical History:  Procedure Laterality Date  . ABDOMINAL HYSTERECTOMY  1965  . APPENDECTOMY    . CHOLECYSTECTOMY    . KNEE ARTHROSCOPY Left    [facility]  . SHOULDER OPEN ROTATOR CUFF REPAIR Bilateral 2007,  2010   [facility]  . THROAT SURGERY  2014   "arthritis hugh knot" (02/03/2014)   Patient Active Problem List   Diagnosis Date Noted  . Knee pain, bilateral 05/27/2016  . Exertional dyspnea 04/15/2016  . Left carotid bruit 04/14/2016  . Menopausal hot flushes 08/07/2015  . H/O malignant neoplasm of skin 06/25/2015  . Hypothyroidism 01/31/2015  . History of cervical cancer 01/10/2015  . Screening for osteoporosis 01/10/2015  . Chest pain 02/03/2014  . Dyspepsia 02/03/2014  . Hyperlipidemia 02/03/2014  . Medicare annual wellness visit, subsequent 01/19/2014  . History of IBS 01/19/2014  . S/P breast augmentation 01/19/2014  .  Hyperlipidemia with target LDL less than 100 02/01/2012  . Colon polyps 01/30/2012  . Low back pain potentially associated with radiculopathy 01/30/2012  . Recurrent colitis due to Clostridium difficile 01/19/2011    Class: History of      Prior to Admission medications   Medication Sig Start Date End Date Taking? Authorizing Provider  amitriptyline (ELAVIL) 25 MG tablet TAKE ONE TABLET EVERY DAY 09/15/16   Crecencio Mc, MD  atorvastatin (LIPITOR) 20 MG tablet TAKE ONE TABLET EVERY DAY 10/27/16   Crecencio Mc, MD  bifidobacterium infantis (ALIGN) capsule Take 1 capsule by mouth daily. 01/09/15   Crecencio Mc, MD  liothyronine (CYTOMEL) 5 MCG tablet TAKE TWO TABLETS BY MOUTH EVERY DAY 10/21/16   Crecencio Mc, MD  meloxicam (MOBIC) 15 MG tablet TAKE ONE TABLET EVERY DAY NEED TO SCHEDULE APPOINTMENT IN [place] 03/18/16   Crecencio Mc, MD  meloxicam (MOBIC) 15 MG tablet TAKE ONE TABLET EVERY DAY NEED TO SCHEDULE APPOINTMENT IN [place] 06/16/16   Crecencio Mc, MD  Multiple Vitamin (MULTIVITAMIN) tablet Take 1 tablet by mouth daily.    Historical Provider, MD  pantoprazole (PROTONIX) 40 MG tablet TAKE ONE TABLET EVERY DAY 09/02/16   Crecencio Mc, MD  PREMARIN 0.625 MG tablet TAKE 1 TABLET BY MOUTH EVERY DAY 10/21/16   Crecencio Mc, MD    Allergies Tramadol; Amoxicillin; Promethazine hcl;  and Sulfa drugs cross reactors    Social History Social History  Substance Use Topics  . Smoking status: Former Smoker    Packs/day: 2.00    Years: 7.00    Types: Cigarettes  . Smokeless tobacco: Never Used     Comment: 02/03/2014 "quit smoking in the 1960's"  . Alcohol use No    Review of Systems Patient denies headaches, rhinorrhea, blurry vision, numbness, shortness of breath, chest pain, edema, cough, abdominal pain, nausea, vomiting, diarrhea, dysuria, fevers, rashes or hallucinations unless otherwise stated above in HPI. ____________________________________________   PHYSICAL  EXAM:  VITAL SIGNS: Vitals:   11/06/16 0318  BP: (!) 154/67  Pulse: 73  Resp: 16  Temp: 97.9 F (36.6 C)    Constitutional: Alert and oriented. Well appearing and in no acute distress. Eyes: Conjunctivae are normal. PERRL. EOMI. Head: Atraumatic. Nose: No congestion/rhinnorhea. Mouth/Throat: Mucous membranes are moist.  Oropharynx non-erythematous. Neck: No stridor. Painless ROM. No cervical spine tenderness to palpation Hematological/Lymphatic/Immunilogical: No cervical lymphadenopathy. Cardiovascular: Normal rate, regular rhythm. Grossly normal heart sounds.  Good peripheral circulation. Respiratory: Normal respiratory effort.  No retractions. Lungs CTAB. Gastrointestinal: Soft and nontender. No distention. No abdominal bruits. No CVA tenderness. Musculoskeletal: No lower extremity tenderness nor edema.  No joint effusions. Neurologic:  Normal speech and language. No gross focal neurologic deficits are appreciated. No gait instability. Skin:  Skin is warm, dry and intact. No rash noted. Psychiatric: Mood and affect are normal. Speech and behavior are normal.  ____________________________________________   LABS (all labs ordered are listed, but only abnormal results are displayed)  Results for orders placed or performed during the hospital encounter of 11/06/16 (from the past 24 hour(s))  Lipase, blood     Status: None   Collection Time: 11/06/16  3:23 AM  Result Value Ref Range   Lipase 18 11 - 51 U/L  Comprehensive metabolic panel     Status: Abnormal   Collection Time: 11/06/16  3:23 AM  Result Value Ref Range   Sodium 138 135 - 145 mmol/L   Potassium 3.3 (L) 3.5 - 5.1 mmol/L   Chloride 106 101 - 111 mmol/L   CO2 23 22 - 32 mmol/L   Glucose, Bld 161 (H) 65 - 99 mg/dL   BUN 18 6 - 20 mg/dL   Creatinine, Ser 0.96 0.44 - 1.00 mg/dL   Calcium 9.1 8.9 - 10.3 mg/dL   Total Protein 7.1 6.5 - 8.1 g/dL   Albumin 3.8 3.5 - 5.0 g/dL   AST 35 15 - 41 U/L   ALT 20 14 - 54  U/L   Alkaline Phosphatase 57 38 - 126 U/L   Total Bilirubin 0.7 0.3 - 1.2 mg/dL   GFR calc non Af Amer 53 (L) >60 mL/min   GFR calc Af Amer >60 >60 mL/min   Anion gap 9 5 - 15  CBC     Status: None   Collection Time: 11/06/16  3:23 AM  Result Value Ref Range   WBC 8.2 3.6 - 11.0 K/uL   RBC 4.71 3.80 - 5.20 MIL/uL   Hemoglobin 15.0 12.0 - 16.0 g/dL   HCT 43.4 35.0 - 47.0 %   MCV 92.1 80.0 - 100.0 fL   MCH 31.8 26.0 - 34.0 pg   MCHC 34.5 32.0 - 36.0 g/dL   RDW 13.5 11.5 - 14.5 %   Platelets 239 150 - 440 K/uL   ____________________________________________ ____________________________________________  RADIOLOGY   ____________________________________________   PROCEDURES  Procedure(s) performed: none  Procedures    Critical Care performed: no ____________________________________________   INITIAL IMPRESSION / ASSESSMENT AND PLAN / ED COURSE  Pertinent labs & imaging results that were available during my care of the patient were reviewed by me and considered in my medical decision making (see chart for details).  DDX: AGE, dehydration, aki, enteritis, pna, appendicitis, cholecystitis  RAEGIN HAINES is a 82 y.o. who presents to the ED with chief complaint of nausea vomiting diarrhea for the past several days. Patient arrives afebrile hemodynamic stable. Her abdominal exam is soft and benign. Laboratory evaluation shows no evidence of leukocytosis or significant dehydration. Abdominal exam do not feel CT imaging clinically indicated at this time.We'll provide IV fluids as well as antiemetics. History and presentation most consistent with viral process.  Clinical Course as of Nov 06 656  Thu Nov 06, 2016  [id number] Patient reassessed. Currently tolerating oral hydration.  Patient maintaining mechanically stable. Blood work shows no evidence of a KI or significant electrolyte abnormality. Urinalysis not completed at this time a patient denies any dysuria and do not feel it  would likely change management at this time. Will discharge patient home with antibiotics and instructions to return to the ER should her symptoms worsen.  Patient was able to tolerate PO and was able to ambulate with a steady gait. Have discussed with the patient and available family all diagnostics and treatments performed thus far and all questions were answered to the best of my ability. The patient demonstrates understanding and agreement with plan.   [PR]    Clinical Course User Index [PR] Merlyn Lot, MD     ____________________________________________   FINAL CLINICAL IMPRESSION(S) / ED DIAGNOSES  Final diagnoses:  Nausea vomiting and diarrhea      NEW MEDICATIONS STARTED DURING THIS VISIT:  New Prescriptions   No medications on file     Note:  This document was prepared using Dragon voice recognition software and may include unintentional dictation errors.    Merlyn Lot, MD 11/06/16 [phone]

## 2016-11-06 NOTE — ED Notes (Signed)
Patient tolerated 12 ounces water PO with no issue.

## 2016-11-06 NOTE — ED Notes (Signed)
MD Quentin Cornwall requested PO challenge. Patient given water and requested to alert RN for returning N/V/D. RN will continue to monitor

## 2016-11-13 DIAGNOSIS — M1712 Unilateral primary osteoarthritis, left knee: Secondary | ICD-10-CM | POA: Diagnosis not present

## 2016-11-13 DIAGNOSIS — M1711 Unilateral primary osteoarthritis, right knee: Secondary | ICD-10-CM | POA: Diagnosis not present

## 2016-12-12 ENCOUNTER — Other Ambulatory Visit: Payer: Self-pay | Admitting: Internal Medicine

## 2016-12-12 NOTE — Telephone Encounter (Signed)
Last OV 7/17 ok to fill?

## 2016-12-12 NOTE — Telephone Encounter (Signed)
Refilled  Please schedule follow up appt for patient

## 2016-12-15 ENCOUNTER — Ambulatory Visit: Payer: Medicare Other | Attending: Orthopedic Surgery | Admitting: Physical Therapy

## 2016-12-15 ENCOUNTER — Encounter: Payer: Self-pay | Admitting: Physical Therapy

## 2016-12-15 DIAGNOSIS — M6281 Muscle weakness (generalized): Secondary | ICD-10-CM

## 2016-12-15 DIAGNOSIS — R262 Difficulty in walking, not elsewhere classified: Secondary | ICD-10-CM | POA: Diagnosis not present

## 2016-12-16 NOTE — Therapy (Signed)
[facility] [address], [place], [address] Phone: [phone]   Fax:  [phone]  Physical Therapy Evaluation  Patient Details  Name: Emily Wu MRN: [id number] Date of Birth: 1931-09-30 Referring Provider: Lyndee Leo PA  Encounter Date: 12/15/2016      PT End of Session - 12/15/16 1032    Visit Number 1   Number of Visits 12   Date for PT Re-Evaluation 01/26/17   Authorization Type 1   Authorization Time Period 10 (G codes)   PT Start Time 0932   PT Stop Time 1030   PT Time Calculation (min) 58 min   Activity Tolerance Patient tolerated treatment well   Behavior During Therapy [facility] for tasks assessed/performed      Past Medical History:  Diagnosis Date  . Arthritis    "knees, shoulders, chest" (02/03/2014)  . Bruises easily   . Cervical cancer ([place]) 1965  . High cholesterol   . Hypertension   . Tendency toward bleeding easily [facility])     Past Surgical History:  Procedure Laterality Date  . ABDOMINAL HYSTERECTOMY  1965  . APPENDECTOMY    . CHOLECYSTECTOMY    . KNEE ARTHROSCOPY Left    [facility]  . SHOULDER OPEN ROTATOR CUFF REPAIR Bilateral 2007,  2010   [facility]  . THROAT SURGERY  03/08/13   "arthritis hugh knot" (02/03/2014)    There were no vitals filed for this visit.       Subjective Assessment - 12/15/16 0935    Subjective Patient reports she is having weakness in both LE's and she holds onto her legs to help stabilize her legs with walking. She reports falling 3x in the past year: outdoors 03/2016, then in bathroom 10/17 fell onto right knee when husband fell and pulled her down with him and the third time she was bent over while on toilet and fell forward onto her knees.    Pertinent History fall last May 2017 in which she may have landed on knees. She has had 2 injections with steroids with temporary relief of symptoms and then had (1) gel injection 11/06/2016 with good results  and now is referred to PT for strengthening.    Limitations Other (comment);Sitting;Walking  lying down at night, shooting pains into knees    How long can you stand comfortably? 15 min.   How long can you walk comfortably? 20 min.   Patient Stated Goals increase strength in legs to walk and perform activities without difficulty.   Currently in Pain? No/denies            [facility] PT Assessment - 12/15/16 0956      Assessment   Medical Diagnosis Prmary osteoarthritis right and left knee   Referring Provider Lyndee Leo PA   Onset Date/Surgical Date 03/24/16   Hand Dominance Right   Next MD Visit unknown   Prior Therapy none     Precautions   Precautions None     Restrictions   Weight Bearing Restrictions No     Balance Screen   Has the patient fallen in the past 6 months Yes   How many times? 2   Has the patient had a decrease in activity level because of a fear of falling?  No   Is the patient reluctant to leave their home because of a fear of falling?  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   Vocation Requirements household chores   Leisure gardening, flowers, go out  to eat, knit, puzzles, bake     Cognition   Overall Cognitive Status Within Functional Limits for tasks assessed       Objective:  Gait: ambulating without AD into clinic: forward flexed posture at hips, increased knee flexion with decreased trunk rotation AROM: LE's bilateral WNL's all planes of motion hips to ankles Strength; grossly tested: right hip flexion, abduction and ER 4-/5, right knee extension 4/5, flexion 4/5,  left LE 4/5 hip flexion, abduction, knee  extension and flexion  Outcome measure: LEFS 27/80; 5x sit to stand 12 seconds (WNL)  Treatment: Therapeutic Exercise: patient performed exercises with guidance, verbal and tactile cues and demonstration of PT: Sitting: hip adduction with glute sets with TrA contraction x 15 reps Sitting: hip abduction with green resistive band x 15 reps Sitting: SLR x 10  Patient response to treatment: Patient demonstrated improved technique with exercises with minimal VC for correct alignment. No reported pain with exercises.          PT Education - 12/15/16 1030    Education provided Yes   Education Details HEP: seated SLR, hip adduction with ball and glute setting, hip abduction in sitting with resistive band   Person(s) Educated Patient   Methods Explanation;Demonstration;Verbal cues;Handout   Comprehension Verbalized understanding;Returned demonstration;Verbal cues required;Need further instruction             PT Long Term Goals - 12/15/16 1032      PT LONG TERM GOAL #1   Title Patient will demonstrate independence with home exercises for strengthening and balance to prevent future falls and improve confidence with walking on level, uneven surfaces by 01/26/2017   Baseline requires verbal cuing and demonstration to perform exercises correctly    Status New     PT LONG TERM GOAL #2   Title Patient will be able to walk without holding onto front of thighs with improved posture with decreased forward trunk lean to improve community ambulation, decrease fall risk by 01/26/2017   Baseline patient holds onto both thighs when ambulating on level surfaces and stairs   Status New     PT LONG TERM GOAL #3   Title Patient will demonstrate improved function with daily tasks with LEFS score of 50 or better by 01/26/2017   Baseline LEFS 27/80   Status New               Plan - 12/15/16 1033    Clinical Impression Statement Patient is an 83 year old female who presents with bilateral LE  weakness and difficulty walking following multiple falls over the past year. She has LEFS score of 27/80 indicating severe self perceived disability with functional tasks. She has fuctional limitations of strength and difficulty with community ambulation and will benefit from physical therapy intervention to improve strength and function in order to be able to walk with less difficulty and return to prior level of function.    Rehab Potential Good   Clinical Impairments Affecting Rehab Potential (+) motivated, prior level of function (-) age, arthritis bilateral knees   PT Frequency 2x /  week   PT Duration 6 weeks   PT Treatment/Interventions Electrical Stimulation;Cryotherapy;Moist Heat;Patient/family education;Neuromuscular re-education;Therapeutic exercise;Manual techniques   PT Next Visit Plan progressive exercises for strength, balance, proprioception   PT Home Exercise Plan strengthening exercises LE's   Consulted and Agree with Plan of Care Patient      Patient will benefit from skilled therapeutic intervention in order to improve the following deficits and impairments:  Decreased strength, Impaired perceived functional ability, Decreased activity tolerance, Decreased endurance, Difficulty walking  Visit Diagnosis: Muscle weakness (generalized) - Plan: PT plan of care cert/re-cert  Difficulty in walking, not elsewhere classified - Plan: PT plan of care cert/re-cert      G-Codes - [id number]    Functional Assessment Tool Used LEFS, 10MW, 5x sit to stand, strength deficits, clinical judgment   Functional Limitation Mobility: Walking and moving around   Mobility: Walking and Moving Around Current Status [id number]) At least 40 percent but less than 60 percent impaired, limited or restricted   Mobility: Walking and Moving Around Goal Status [phone]) At least 1 percent but less than 20 percent impaired, limited or restricted       Problem List Patient Active Problem List   Diagnosis  Date Noted  . Knee pain, bilateral 05/27/2016  . Exertional dyspnea 04/15/2016  . Left carotid bruit 04/14/2016  . Menopausal hot flushes 08/07/2015  . H/O malignant neoplasm of skin 06/25/2015  . Hypothyroidism 01/31/2015  . History of cervical cancer 01/10/2015  . Screening for osteoporosis 01/10/2015  . Chest pain 02/03/2014  . Dyspepsia 02/03/2014  . Hyperlipidemia 02/03/2014  . Medicare annual wellness visit, subsequent 01/19/2014  . History of IBS 01/19/2014  . S/P breast augmentation 01/19/2014  . Hyperlipidemia with target LDL less than 100 02/01/2012  . Colon polyps 01/30/2012  . Low back pain potentially associated with radiculopathy 01/30/2012  . Recurrent colitis due to Clostridium difficile 01/19/2011    Class: History of    Jomarie Longs PT 12/16/2016, 4:58 PM  [facility] [address], [place], [address] Phone: [phone]   Fax:  [phone]  Name: Emily Wu MRN: [id number] Date of Birth: 1931-02-19

## 2016-12-18 ENCOUNTER — Ambulatory Visit: Payer: Medicare Other | Admitting: Physical Therapy

## 2016-12-18 ENCOUNTER — Encounter: Payer: Self-pay | Admitting: Physical Therapy

## 2016-12-18 DIAGNOSIS — M6281 Muscle weakness (generalized): Secondary | ICD-10-CM | POA: Diagnosis not present

## 2016-12-18 DIAGNOSIS — R262 Difficulty in walking, not elsewhere classified: Secondary | ICD-10-CM

## 2016-12-19 NOTE — Therapy (Signed)
[facility] [birth year][address][birth year][address] [place], [address] Phone: [phone]   Fax:  [phone]  Physical Therapy Treatment  Patient Details  Name: Emily Wu MRN: [id number] Date of Birth: 05/30/1931 Referring Provider: Lyndee Leo PA  Encounter Date: 12/18/2016      PT End of Session - 12/18/16 1629    Visit Number 2   Number of Visits 12   Date for PT Re-Evaluation 01/26/17   Authorization Type 2   Authorization Time Period 10 (G codes)   PT Start Time 1532-02-29   PT Stop Time 1619-03-01   PT Time Calculation (min) 47 min   Activity Tolerance Patient tolerated treatment well   Behavior During Therapy [facility] for tasks assessed/performed      Past Medical History:  Diagnosis Date  . Arthritis    "knees, shoulders, chest" (02/03/2014)  . Bruises easily   . Cervical cancer ([place]) 1965  . High cholesterol   . Hypertension   . Tendency toward bleeding easily [facility])     Past Surgical History:  Procedure Laterality Date  . ABDOMINAL HYSTERECTOMY  1965  . APPENDECTOMY    . CHOLECYSTECTOMY    . KNEE ARTHROSCOPY Left    [facility]  . SHOULDER OPEN ROTATOR CUFF REPAIR Bilateral 2007,  2010   [facility]  . THROAT SURGERY  Feb 28, 2013   "arthritis hugh knot" (02/03/2014)    There were no vitals filed for this visit.      Subjective Assessment - 12/18/16 1535    Subjective Patient reports she can feel good things happening in the back of her legs, feeling stronger.    Limitations Other (comment);Sitting;Walking  lying down at night with shooting pain into knees   Patient Stated Goals increase strength in legs to walk and perform activities without difficulty.   Currently in Pain? No/denies      Objective; Gait; ambulating without AD, knees in varus, holding onto thighs intermittently    Treatment: Therapeutic Exercise: patient performed exercises with guidance, verbal and tactile cues and demonstration of PT: Sitting:  hip adduction with glute sets with TrA contraction x 15 reps Sitting: hip abduction with manual resistance x 15 reps Knee extension with 2# weights at ankles 2 x 15 Knee flexion with red resistive band 2 x 15 Standing walk along airex beam side stepping x 2 min. single leg stand with opposite leg hip flexion/extension x 10 reps Calf raises off airex balance beam 2 x 10  NuStep x 10 min. At end of exercises (no charge) seat #9, level #3 intensity  Patient response to treatment: Patient demonstrated improved ability to balance and performed exercises with minimal VC and close supervision, non contact guard for safety. Moderate fatigue with exercises and required res breaks between exercises.         PT Education - 12/18/16 1620    Education provided Yes   Education Details re assessed home program   Person(s) Educated Patient   Methods Explanation;Demonstration;Verbal cues   Comprehension Verbalized understanding;Returned demonstration;Verbal cues required;Need further instruction             PT Long Term Goals - 12/15/16 1032      PT LONG TERM GOAL #1   Title Patient will demonstrate independence with home exercises for strengthening and balance to prevent future falls and improve confidence with walking on level, uneven surfaces by 01/26/2017   Baseline requires verbal cuing and demonstration to perform exercises correctly  Status New     PT LONG TERM GOAL #2   Title Patient will be able to walk without holding onto front of thighs with improved posture with decreased forward trunk lean to improve community ambulation, decrease fall risk by 01/26/2017   Baseline patient holds onto both thighs when ambulating on level surfaces and stairs   Status New     PT LONG TERM GOAL #3   Title Patient will demonstrate improved function with daily tasks with LEFS score of 50 or better by 01/26/2017   Baseline LEFS 27/80   Status New               Plan - 12/18/16 1627     Clinical Impression Statement Patient able to perform exercises with improved motor control with repetition and guidance of therapist. she continues with weakness in both LE's and decreased balance with walking and will require additional physical therapy intervention to achieve goals.    Rehab Potential Good   PT Frequency 2x / week   PT Duration 6 weeks   PT Treatment/Interventions Electrical Stimulation;Cryotherapy;Moist Heat;Patient/family education;Neuromuscular re-education;Therapeutic exercise;Manual techniques   PT Home Exercise Plan strengthening exercises LE's      Patient will benefit from skilled therapeutic intervention in order to improve the following deficits and impairments:  Decreased strength, Impaired perceived functional ability, Decreased activity tolerance, Decreased endurance, Difficulty walking  Visit Diagnosis: Muscle weakness (generalized)  Difficulty in walking, not elsewhere classified     Problem List Patient Active Problem List   Diagnosis Date Noted  . Knee pain, bilateral 05/27/2016  . Exertional dyspnea 04/15/2016  . Left carotid bruit 04/14/2016  . Menopausal hot flushes 08/07/2015  . H/O malignant neoplasm of skin 06/25/2015  . Hypothyroidism 01/31/2015  . History of cervical cancer 01/10/2015  . Screening for osteoporosis 01/10/2015  . Chest pain 02/03/2014  . Dyspepsia 02/03/2014  . Hyperlipidemia 02/03/2014  . Medicare annual wellness visit, subsequent 01/19/2014  . History of IBS 01/19/2014  . S/P breast augmentation 01/19/2014  . Hyperlipidemia with target LDL less than 100 02/01/2012  . Colon polyps 01/30/2012  . Low back pain potentially associated with radiculopathy 01/30/2012  . Recurrent colitis due to Clostridium difficile 01/19/2011    Class: History of    Jomarie Longs PT 12/19/2016, 7:18 PM  [facility] [address], [place], [address] Phone:  [phone]   Fax:  [phone]  Name: Emily Wu MRN: [id number] Date of Birth: December 25, 1930

## 2016-12-22 ENCOUNTER — Ambulatory Visit: Payer: Medicare Other | Admitting: Physical Therapy

## 2016-12-22 ENCOUNTER — Encounter: Payer: Self-pay | Admitting: Physical Therapy

## 2016-12-22 DIAGNOSIS — M6281 Muscle weakness (generalized): Secondary | ICD-10-CM

## 2016-12-22 DIAGNOSIS — R262 Difficulty in walking, not elsewhere classified: Secondary | ICD-10-CM | POA: Diagnosis not present

## 2016-12-22 NOTE — Therapy (Signed)
[facility] [address] [place], [address] Phone: [phone]   Fax:  [phone]  Physical Therapy Treatment  Patient Details  Name: Emily Wu MRN: [id number] Date of Birth: 19-Oct-1931 Referring Provider: Lyndee Leo PA  Encounter Date: 12/22/2016      PT End of Session - 12/22/16 0900    Visit Number 3   Number of Visits 12   Date for PT Re-Evaluation 01/26/17   Authorization Type 3   Authorization Time Period 10 (G codes)   PT Start Time 0857   PT Stop Time 0940   PT Time Calculation (min) 43 min   Activity Tolerance Patient tolerated treatment well   Behavior During Therapy [facility] for tasks assessed/performed      Past Medical History:  Diagnosis Date  . Arthritis    "knees, shoulders, chest" (02/03/2014)  . Bruises easily   . Cervical cancer ([place]) 1965  . High cholesterol   . Hypertension   . Tendency toward bleeding easily [facility])     Past Surgical History:  Procedure Laterality Date  . ABDOMINAL HYSTERECTOMY  1965  . APPENDECTOMY    . CHOLECYSTECTOMY    . KNEE ARTHROSCOPY Left    [facility]  . SHOULDER OPEN ROTATOR CUFF REPAIR Bilateral 2007,  2010   [facility]  . THROAT SURGERY  2013/03/03   "arthritis hugh knot" (02/03/2014)    There were no vitals filed for this visit.      Subjective Assessment - 12/22/16 0858    Subjective Patient reports she is feeling better and she was a little sore following last session   Limitations Other (comment);Sitting;Walking  shooting pain into legs lying down   Patient Stated Goals increase strength in legs to walk and perform activities without difficulty.   Currently in Pain? No/denies        Objective; Gait; ambulating without AD, knees in varus, more erect posture than previous session    Treatment: Therapeutic Exercise: patient performed exercises with guidance, verbal and tactile cues and demonstration of PT: Sitting: hip adduction  with glute sets with TrA contraction x 15 reps Sitting: hip abduction with double resistive band x 15 reps, single resistive band x 25 reps Knee extension with 2# weights at ankles 2 x 15 Knee flexion with red resistive band 2 x 15 Standing walk along airex beam side stepping x 2 min. Hip extension in sit with double resistive band and assist of therapist x 15 reps each LE Sit to stand off high table with controlled motion with concentration on erect posture, not using support of back of legs and with hips in neutral position, 10 reps Lateral step ups onto balance beam with toe tap into tandem stance x 15 reps each LE with support of UE on counter and chair for safety  NuStep x 10 min. At end of exercises (no charge) seat #8, level #3 intensity  Patient response to treatment: Patient improved posture with increased control with repetition sit to stand exercise. Mild fatigue noted with exercises, continues with popping/grinding left knee with standing, sit to stand activities.          PT Education - 12/22/16 0900    Education provided Yes   Education Details HEP; re assessed stanidng, balance exercises and strengthening   Person(s) Educated Patient   Methods Explanation;Demonstration;Verbal cues   Comprehension Verbalized understanding;Returned demonstration;Verbal cues required;Need further instruction  PT Long Term Goals - 12/15/16 1032      PT LONG TERM GOAL #1   Title Patient will demonstrate independence with home exercises for strengthening and balance to prevent future falls and improve confidence with walking on level, uneven surfaces by 01/26/2017   Baseline requires verbal cuing and demonstration to perform exercises correctly    Status New     PT LONG TERM GOAL #2   Title Patient will be able to walk without holding onto front of thighs with improved posture with decreased forward trunk lean to improve community ambulation, decrease fall risk by 01/26/2017    Baseline patient holds onto both thighs when ambulating on level surfaces and stairs   Status New     PT LONG TERM GOAL #3   Title Patient will demonstrate improved function with daily tasks with LEFS score of 50 or better by 01/26/2017   Baseline LEFS 27/80   Status New               Plan - 12/22/16 0901    Clinical Impression Statement Patient progressing well towards goals with improving strength and endurance as noted with exercises. She demonstrates improvement with motor control and will benefit from continued physical therapy intervention to address weakness in order to achieve goals.    Rehab Potential Good   PT Frequency 2x / week   PT Duration 6 weeks   PT Treatment/Interventions Electrical Stimulation;Cryotherapy;Moist Heat;Patient/family education;Neuromuscular re-education;Therapeutic exercise;Manual techniques   PT Next Visit Plan progressive exercises for strength, balance, proprioception   PT Home Exercise Plan strengthening exercises LE's      Patient will benefit from skilled therapeutic intervention in order to improve the following deficits and impairments:  Decreased strength, Impaired perceived functional ability, Decreased activity tolerance, Decreased endurance, Difficulty walking  Visit Diagnosis: Muscle weakness (generalized)  Difficulty in walking, not elsewhere classified     Problem List Patient Active Problem List   Diagnosis Date Noted  . Knee pain, bilateral 05/27/2016  . Exertional dyspnea 04/15/2016  . Left carotid bruit 04/14/2016  . Menopausal hot flushes 08/07/2015  . H/O malignant neoplasm of skin 06/25/2015  . Hypothyroidism 01/31/2015  . History of cervical cancer 01/10/2015  . Screening for osteoporosis 01/10/2015  . Chest pain 02/03/2014  . Dyspepsia 02/03/2014  . Hyperlipidemia 02/03/2014  . Medicare annual wellness visit, subsequent 01/19/2014  . History of IBS 01/19/2014  . S/P breast augmentation 01/19/2014  .  Hyperlipidemia with target LDL less than 100 02/01/2012  . Colon polyps 01/30/2012  . Low back pain potentially associated with radiculopathy 01/30/2012  . Recurrent colitis due to Clostridium difficile 01/19/2011    Class: History of    Jomarie Longs PT 12/22/2016, 2:56 PM  [facility] [address] [place], [address] Phone: [phone]   Fax:  [phone]  Name: Emily Wu MRN: [id number] Date of Birth: 1930/12/02

## 2016-12-25 ENCOUNTER — Encounter: Payer: Medicare Other | Admitting: Physical Therapy

## 2016-12-26 ENCOUNTER — Encounter: Payer: Self-pay | Admitting: Physical Therapy

## 2016-12-26 ENCOUNTER — Ambulatory Visit: Payer: Medicare Other | Attending: Orthopedic Surgery | Admitting: Physical Therapy

## 2016-12-26 DIAGNOSIS — R262 Difficulty in walking, not elsewhere classified: Secondary | ICD-10-CM | POA: Diagnosis not present

## 2016-12-26 DIAGNOSIS — M6281 Muscle weakness (generalized): Secondary | ICD-10-CM | POA: Insufficient documentation

## 2016-12-26 NOTE — Therapy (Signed)
[place] [facility] [address] [place], [address] Phone: [phone]   Fax:  [phone]  Physical Therapy Treatment  Patient Details  Name: Emily Wu MRN: [id number] Date of Birth: 1930-12-07 Referring Provider: Lyndee Leo PA  Encounter Date: 12/26/2016      PT End of Session - 12/26/16 1123    Visit Number 3   Number of Visits 12   Date for PT Re-Evaluation 01/26/17   Authorization Type 4   Authorization Time Period 10 (G codes)   PT Start Time 03-13-14   PT Stop Time 1150   PT Time Calculation (min) 35 min   Activity Tolerance Patient tolerated treatment well   Behavior During Therapy [facility] for tasks assessed/performed      Past Medical History:  Diagnosis Date  . Arthritis    "knees, shoulders, chest" (02/03/2014)  . Bruises easily   . Cervical cancer ([place]) 1965  . High cholesterol   . Hypertension   . Tendency toward bleeding easily [facility])     Past Surgical History:  Procedure Laterality Date  . ABDOMINAL HYSTERECTOMY  1965  . APPENDECTOMY    . CHOLECYSTECTOMY    . KNEE ARTHROSCOPY Left    [facility]  . SHOULDER OPEN ROTATOR CUFF REPAIR Bilateral 2007,  2010   [facility]  . THROAT SURGERY  March 13, 2013   "arthritis hugh knot" (02/03/2014)    There were no vitals filed for this visit.      Subjective Assessment - 12/26/16 1122    Subjective Patient reports she is feeling better and is noticing improvement in knee pain, alignment and is feeling stronger in general.    Limitations Other (comment);Sitting;Walking   Patient Stated Goals increase strength in legs to walk and perform activities without difficulty.   Currently in Pain? No/denies      Objective; Gait; ambulating without AD, knees in varus, more erect posture than previous session   Treatment: Therapeutic Exercise: patient performed exercises with guidance, verbal and tactile cues and demonstration of PT: Sitting: hip abduction  with double resistive band x 15 reps, single resistive band x 25 reps Knee extension with 2# weights at ankles 2 x 15 Knee flexion with red, green resistive band 2 x 15 Standing walk along airex beam side stepping x 2 min. Sit to stand off chair with balance pad with controlled motion with concentration on erect posture, not using support of UE's and with hips in neutral position, 5 reps Lateral step ups onto balance beam with toe tap into tandem stance x 15 reps each LE with support of UE on chair for safety Walk on balance stones x 5 sets, staggered stones  NuStep x 5 min. At end of exercises (no charge) seat #7, level #2 intensity  Patient response to treatment: Patient improved endurance, technique with all exercises with VC and demonstration.  Mild fatigue and left knee pain reported with balance stone exercise, continues with popping left knee with standing, sit to stand activities.          PT Education - 12/26/16 1200    Education provided Yes   Education Details HEP: advanced to green resistive band, re assessed exercises and use of knee brace to support knee during walking   Person(s) Educated Patient   Methods Explanation;Demonstration;Verbal cues   Comprehension Verbalized understanding;Returned demonstration;Verbal cues required;Need further instruction             PT Long Term Goals - 12/15/16 1032  PT LONG TERM GOAL #1   Title Patient will demonstrate independence with home exercises for strengthening and balance to prevent future falls and improve confidence with walking on level, uneven surfaces by 01/26/2017   Baseline requires verbal cuing and demonstration to perform exercises correctly    Status New     PT LONG TERM GOAL #2   Title Patient will be able to walk without holding onto front of thighs with improved posture with decreased forward trunk lean to improve community ambulation, decrease fall risk by 01/26/2017   Baseline patient holds onto both  thighs when ambulating on level surfaces and stairs   Status New     PT LONG TERM GOAL #3   Title Patient will demonstrate improved function with daily tasks with LEFS score of 50 or better by 01/26/2017   Baseline LEFS 27/80   Status New               Plan - 12/26/16 1305    Clinical Impression Statement Patient progressing with improved posture and strength as demonstrated with sit to stand with less effort and able to perform exercises with less difficulty.    Rehab Potential Good   PT Frequency 2x / week   PT Duration 6 weeks   PT Treatment/Interventions Electrical Stimulation;Cryotherapy;Moist Heat;Patient/family education;Neuromuscular re-education;Therapeutic exercise;Manual techniques   PT Next Visit Plan progressive exercises for strength, balance, proprioception   PT Home Exercise Plan strengthening exercises LE's      Patient will benefit from skilled therapeutic intervention in order to improve the following deficits and impairments:  Decreased strength, Impaired perceived functional ability, Decreased activity tolerance, Decreased endurance, Difficulty walking  Visit Diagnosis: Muscle weakness (generalized)  Difficulty in walking, not elsewhere classified     Problem List Patient Active Problem List   Diagnosis Date Noted  . Knee pain, bilateral 05/27/2016  . Exertional dyspnea 04/15/2016  . Left carotid bruit 04/14/2016  . Menopausal hot flushes 08/07/2015  . H/O malignant neoplasm of skin 06/25/2015  . Hypothyroidism 01/31/2015  . History of cervical cancer 01/10/2015  . Screening for osteoporosis 01/10/2015  . Chest pain 02/03/2014  . Dyspepsia 02/03/2014  . Hyperlipidemia 02/03/2014  . Medicare annual wellness visit, subsequent 01/19/2014  . History of IBS 01/19/2014  . S/P breast augmentation 01/19/2014  . Hyperlipidemia with target LDL less than 100 02/01/2012  . Colon polyps 01/30/2012  . Low back pain potentially associated with  radiculopathy 01/30/2012  . Recurrent colitis due to Clostridium difficile 01/19/2011    Class: History of    Jomarie Longs PT 12/26/2016, 1:07 PM  [place] [facility] [address] [place], [address] Phone: [phone]   Fax:  [phone]  Name: Emily Wu MRN: [id number] Date of Birth: Jul 19, 1931

## 2016-12-30 DIAGNOSIS — L300 Nummular dermatitis: Secondary | ICD-10-CM | POA: Diagnosis not present

## 2017-01-01 ENCOUNTER — Ambulatory Visit: Payer: Medicare Other | Admitting: Physical Therapy

## 2017-01-07 ENCOUNTER — Encounter: Payer: Medicare Other | Admitting: Physical Therapy

## 2017-01-08 ENCOUNTER — Ambulatory Visit: Payer: Medicare Other | Admitting: Physical Therapy

## 2017-01-08 ENCOUNTER — Encounter: Payer: Self-pay | Admitting: Physical Therapy

## 2017-01-08 DIAGNOSIS — R262 Difficulty in walking, not elsewhere classified: Secondary | ICD-10-CM

## 2017-01-08 DIAGNOSIS — M6281 Muscle weakness (generalized): Secondary | ICD-10-CM | POA: Diagnosis not present

## 2017-01-08 NOTE — Therapy (Signed)
[place] [facility] [address] [place], [address] Phone: [phone]   Fax:  [phone]  Physical Therapy Treatment  Patient Details  Name: Emily Wu MRN: [id number] Date of Birth: Sep 05, 1931 Referring Provider: Lyndee Leo PA  Encounter Date: 01/08/2017      PT End of Session - 01/08/17 0910    Visit Number 5   Number of Visits 12   Date for PT Re-Evaluation 01/26/17   Authorization Type 5   Authorization Time Period 10 (G codes)   PT Start Time 0856   PT Stop Time 0950   PT Time Calculation (min) 54 min   Activity Tolerance Patient tolerated treatment well   Behavior During Therapy [facility] for tasks assessed/performed      Past Medical History:  Diagnosis Date  . Arthritis    "knees, shoulders, chest" (02/03/2014)  . Bruises easily   . Cervical cancer ([place]) 1965  . High cholesterol   . Hypertension   . Tendency toward bleeding easily [facility])     Past Surgical History:  Procedure Laterality Date  . ABDOMINAL HYSTERECTOMY  1965  . APPENDECTOMY    . CHOLECYSTECTOMY    . KNEE ARTHROSCOPY Left    [facility]  . SHOULDER OPEN ROTATOR CUFF REPAIR Bilateral 2007,  2010   [facility]  . THROAT SURGERY  2013/03/05   "arthritis hugh knot" (02/03/2014)    There were no vitals filed for this visit.      Subjective Assessment - 01/08/17 0902    Subjective Patient reports she has had sickness in her family and had hives on both legs in the past week which prevented her from attending therapy. Today she is having no pain in her LE's and she has exercised very little over the past week. She feels she able to walk with less difficulty.    Limitations Sitting;Walking;Other (comment)  sit to stand    How long can you stand comfortably? 20-30 min.   How long can you walk comfortably? grocery shopping with cart ok >30 min.   Patient Stated Goals increase strength in legs to walk and perform activities without  difficulty.   Currently in Pain? No/denies      Objective; Gait; decreased trunk rotation, pelvis anteriorly rotated, varus at knees Strength: decreased bilateral gluteal, hamstring and quadriceps strength as demonstrated with decreased function with sit to stand   Treatment: Therapeutic Exercise: patient performed exercises with guidance, verbal and tactile cues and demonstration of PT: Sitting: hip abduction with manual resistance of therapist x 15 reps 5 second holds end range Knee extension with 3# weights at ankles 2 x 15 Knee flexion with red resistive band 2 x 20 Hip adduction with ball with glute sets with VC x 10 reps, hold position x 3 seconds Standing walk along airex beam side stepping x 2 min. Sit to stand off elevated treatment table 23"  with controlled motion with concentration on erect posture, not using support of UE's and with hips in neutral position, 2 x 5  reps Lateral step ups onto balance beam with toe tap into tandem stance x 15 reps each LE with support of UE on chair for safety Walk on balance stones x 5 sets, staggered stones Balance system for weight shifting activities: side to side, forward and back x 2 min. each followed by limits of stability x 1 with cuing and demonstration to perform with good technique  NuStep x 10 min. At end of  exercises (no charge) seat #7, level #2 intensity  Patient response to treatment: Patient demonstrated improved posture and pelvic alignment with walking, sit to stand following repetition and use of balance system for exercise. She required VC and demonstration to perform exercises with appropriate posture and technique. Mild fatigue with exercises, decreased left knee pain and popping reported in general with all activities today.          PT Education - 01/08/17 0950    Education provided Yes   Education Details HEP: re assessed exercises and sit to stand with improved posture, pelvis alignment   Person(s)  Educated Patient   Methods Explanation;Demonstration;Verbal cues   Comprehension Verbalized understanding;Returned demonstration;Verbal cues required;Need further instruction             PT Long Term Goals - 12/15/16 1032      PT LONG TERM GOAL #1   Title Patient will demonstrate independence with home exercises for strengthening and balance to prevent future falls and improve confidence with walking on level, uneven surfaces by 01/26/2017   Baseline requires verbal cuing and demonstration to perform exercises correctly    Status New     PT LONG TERM GOAL #2   Title Patient will be able to walk without holding onto front of thighs with improved posture with decreased forward trunk lean to improve community ambulation, decrease fall risk by 01/26/2017   Baseline patient holds onto both thighs when ambulating on level surfaces and stairs   Status New     PT LONG TERM GOAL #3   Title Patient will demonstrate improved function with daily tasks with LEFS score of 50 or better by 01/26/2017   Baseline LEFS 27/80   Status New               Plan - 01/08/17 0955    Clinical Impression Statement Patient is progressing towards goals with improvement noted with walking with improved posture and increasing endurance. She continues with decreased strength in LE's and core and requires cuing to perform exercises correctly and will benefit from continued physical therapy intervention.    Rehab Potential Good   PT Frequency 2x / week   PT Duration 6 weeks   PT Treatment/Interventions Electrical Stimulation;Cryotherapy;Moist Heat;Patient/family education;Neuromuscular re-education;Therapeutic exercise;Manual techniques   PT Next Visit Plan progressive exercises for strength, balance, proprioception   PT Home Exercise Plan strengthening exercises LE's; balance system for weight shifting, sit to stand for proper alignment, strengthening      Patient will benefit from skilled therapeutic  intervention in order to improve the following deficits and impairments:  Decreased strength, Impaired perceived functional ability, Decreased activity tolerance, Decreased endurance, Difficulty walking  Visit Diagnosis: Muscle weakness (generalized)  Difficulty in walking, not elsewhere classified     Problem List Patient Active Problem List   Diagnosis Date Noted  . Knee pain, bilateral 05/27/2016  . Exertional dyspnea 04/15/2016  . Left carotid bruit 04/14/2016  . Menopausal hot flushes 08/07/2015  . H/O malignant neoplasm of skin 06/25/2015  . Hypothyroidism 01/31/2015  . History of cervical cancer 01/10/2015  . Screening for osteoporosis 01/10/2015  . Chest pain 02/03/2014  . Dyspepsia 02/03/2014  . Hyperlipidemia 02/03/2014  . Medicare annual wellness visit, subsequent 01/19/2014  . History of IBS 01/19/2014  . S/P breast augmentation 01/19/2014  . Hyperlipidemia with target LDL less than 100 02/01/2012  . Colon polyps 01/30/2012  . Low back pain potentially associated with radiculopathy 01/30/2012  . Recurrent colitis due to Clostridium difficile  01/19/2011    Class: History of    Jomarie Longs PT 01/08/2017, 7:18 PM  [place] [facility] [address] [place], [address] Phone: [phone]   Fax:  [phone]  Name: ELIZETTE PAFFORD MRN: [id number] Date of Birth: Apr 21, 1931

## 2017-01-12 ENCOUNTER — Ambulatory Visit: Payer: Medicare Other | Admitting: Physical Therapy

## 2017-01-12 ENCOUNTER — Encounter: Payer: Self-pay | Admitting: Physical Therapy

## 2017-01-12 DIAGNOSIS — M6281 Muscle weakness (generalized): Secondary | ICD-10-CM | POA: Diagnosis not present

## 2017-01-12 DIAGNOSIS — R262 Difficulty in walking, not elsewhere classified: Secondary | ICD-10-CM

## 2017-01-12 NOTE — Therapy (Signed)
[facility] [place] [facility] Mar 12, [address] [place], [address] Phone: [phone]   Fax:  [phone]  Physical Therapy Treatment  Patient Details  Name: Emily Wu MRN: [id number] Date of Birth: 1931/04/27 Referring Provider: Lyndee Leo PA  Encounter Date: 01/12/2017      PT End of Session - 01/12/17 1053    Visit Number 6   Number of Visits 12   Date for PT Re-Evaluation 01/26/17   Authorization Type 6   Authorization Time Period 10 (G codes)   PT Start Time 03/13/07   PT Stop Time 1050   PT Time Calculation (min) 42 min   Activity Tolerance Patient tolerated treatment well   Behavior During Therapy [facility] for tasks assessed/performed      Past Medical History:  Diagnosis Date  . Arthritis    "knees, shoulders, chest" (02/03/2014)  . Bruises easily   . Cervical cancer ([place]) 1965  . High cholesterol   . Hypertension   . Tendency toward bleeding easily [facility])     Past Surgical History:  Procedure Laterality Date  . ABDOMINAL HYSTERECTOMY  1965  . APPENDECTOMY    . CHOLECYSTECTOMY    . KNEE ARTHROSCOPY Left    [facility]  . SHOULDER OPEN ROTATOR CUFF REPAIR Bilateral 2007,  2010   [facility]  . THROAT SURGERY  March 12, 2013   "arthritis hugh knot" (02/03/2014)    There were no vitals filed for this visit.      Subjective Assessment - 01/12/17 1009    Subjective Patient reports aching in both knees today and is still dealing with hives   Limitations Sitting;Walking;Other (comment)  sit to stand   Patient Stated Goals increase strength in legs to walk and perform activities without difficulty.   Currently in Pain? No/denies      Objective; Gait; decreased erect posture, leans backwards at hips, decreased trunk roation Strength: Decreased strength bilaterally in hips: gluteal muscles, decreased knee extension and flexion bilaterally 4-/5 Observation: both knees with dry, red patches (patient being treated for  hives by MD)  Treatment: Therapeutic Exercise: patient performed exercises with guidance, verbal and tactile cues and demonstration of PT: Sitting: hip abduction with manual resistance of therapist x 15 reps 5 second holds end range Knee extension with 3# weights at ankles 2 x 15 Knee flexion with redresistive band 2 x 20 Hip adduction with ball with glute sets with VC x 10 reps, hold position x 3 seconds Standing walk along airex beam side stepping x 2 min. (note: patient holding onto front of thighs after completing standing and walking exercises) Sit to stand off elevated treatment table 23" with controlled motion with concentration on erect posture, not using support of UE's and with hips in neutral position, 2 x 5 reps Lateral step ups onto balance beam with toe tap into tandem stance x 15 reps each LE with support of UE on chair for safety Walk on balance stones x 5 sets, staggered stones Balance system for weight shifting activities: side to side, forward and back x 2 min. each followed by limits of stability x 2 with platform unlocked @ #10 with cuing and demonstration to perform with good technique  NuStep x 52min. At end of exercises (no charge) seat #7, level #2intensity  Patient response to treatment: Patient demonstrated improved gait with improved posture and decreased trunk lean backwards following balance system exercises. Patient required VC and guidance to perform exercises and balance system exercises with  good technique. Mild fatigue noted with exercises.         PT Education - 01/12/17 1100    Education provided Yes   Education Details HEP; continue with strengthening as instructed, work on sit to stand with good posture   Person(s) Educated Patient   Methods Explanation   Comprehension Verbalized understanding             PT Long Term Goals - 12/15/16 1032      PT LONG TERM GOAL #1   Title Patient will demonstrate independence with home exercises  for strengthening and balance to prevent future falls and improve confidence with walking on level, uneven surfaces by 01/26/2017   Baseline requires verbal cuing and demonstration to perform exercises correctly    Status New     PT LONG TERM GOAL #2   Title Patient will be able to walk without holding onto front of thighs with improved posture with decreased forward trunk lean to improve community ambulation, decrease fall risk by 01/26/2017   Baseline patient holds onto both thighs when ambulating on level surfaces and stairs   Status New     PT LONG TERM GOAL #3   Title Patient will demonstrate improved function with daily tasks with LEFS score of 50 or better by 01/26/2017   Baseline LEFS 27/80   Status New               Plan - 01/12/17 1100    Clinical Impression Statement Patient demonstrates good progress towards goals with improving abiltiy to perform exercises with greater ease, decreased cuing and able to transfer sit to stand with less difficulty and only intermittently holding onto thighs. She continues with pain, decreased strength in both LE's and will benefit from additional physicla therapy intervention to achieve goals.    Rehab Potential Good   PT Frequency 2x / week   PT Duration 6 weeks   PT Treatment/Interventions Electrical Stimulation;Cryotherapy;Moist Heat;Patient/family education;Neuromuscular re-education;Therapeutic exercise;Manual techniques   PT Next Visit Plan progressive exercises for strength, balance, proprioception   PT Home Exercise Plan strengthening exercises LE's; balance system for weight shifting, sit to stand for proper alignment, strengthening      Patient will benefit from skilled therapeutic intervention in order to improve the following deficits and impairments:  Decreased strength, Impaired perceived functional ability, Decreased activity tolerance, Decreased endurance, Difficulty walking  Visit Diagnosis: Muscle weakness  (generalized)  Difficulty in walking, not elsewhere classified     Problem List Patient Active Problem List   Diagnosis Date Noted  . Knee pain, bilateral 05/27/2016  . Exertional dyspnea 04/15/2016  . Left carotid bruit 04/14/2016  . Menopausal hot flushes 08/07/2015  . H/O malignant neoplasm of skin 06/25/2015  . Hypothyroidism 01/31/2015  . History of cervical cancer 01/10/2015  . Screening for osteoporosis 01/10/2015  . Chest pain 02/03/2014  . Dyspepsia 02/03/2014  . Hyperlipidemia 02/03/2014  . Medicare annual wellness visit, subsequent 01/19/2014  . History of IBS 01/19/2014  . S/P breast augmentation 01/19/2014  . Hyperlipidemia with target LDL less than 100 02/01/2012  . Colon polyps 01/30/2012  . Low back pain potentially associated with radiculopathy 01/30/2012  . Recurrent colitis due to Clostridium difficile 01/19/2011    Class: History of    Jomarie Longs PT 01/13/2017, 2:24 PM  [place] [facility] [address] [place], [address] Phone: [phone]   Fax:  [phone]  Name: Emily Wu MRN: [id number] Date of  Birth: 08-20-1931

## 2017-01-13 ENCOUNTER — Telehealth: Payer: Self-pay | Admitting: Internal Medicine

## 2017-01-13 NOTE — Telephone Encounter (Signed)
[employer] approved on Cover My meds.

## 2017-01-20 ENCOUNTER — Ambulatory Visit: Payer: Medicare Other | Admitting: Physical Therapy

## 2017-01-20 ENCOUNTER — Encounter: Payer: Self-pay | Admitting: Physical Therapy

## 2017-01-20 DIAGNOSIS — R262 Difficulty in walking, not elsewhere classified: Secondary | ICD-10-CM

## 2017-01-20 DIAGNOSIS — M6281 Muscle weakness (generalized): Secondary | ICD-10-CM

## 2017-01-21 NOTE — Therapy (Signed)
[facility] [address], [place], [address] Phone: [phone]   Fax:  [phone]  Physical Therapy Treatment  Patient Details  Name: Emily Wu MRN: [id number] Date of Birth: 1931/03/27 Referring Provider: Lyndee Leo PA  Encounter Date: 01/20/2017      PT End of Session - 01/20/17 1130    Visit Number 7   Number of Visits 12   Date for PT Re-Evaluation 01/26/17   Authorization Type 7   Authorization Time Period 10 (G codes)   PT Start Time [id number]   PT Stop Time 1130   PT Time Calculation (min) 55 min   Activity Tolerance Patient tolerated treatment well   Behavior During Therapy [facility] for tasks assessed/performed      Past Medical History:  Diagnosis Date  . Arthritis    "knees, shoulders, chest" (02/03/2014)  . Bruises easily   . Cervical cancer ([place]) 1965  . High cholesterol   . Hypertension   . Tendency toward bleeding easily [facility])     Past Surgical History:  Procedure Laterality Date  . ABDOMINAL HYSTERECTOMY  1965  . APPENDECTOMY    . CHOLECYSTECTOMY    . KNEE ARTHROSCOPY Left    [place]  . SHOULDER OPEN ROTATOR CUFF REPAIR Bilateral 2007,  2010   [place]  . THROAT SURGERY  2014   "arthritis hugh knot" (02/03/2014)    There were no vitals filed for this visit.      Subjective Assessment - 01/20/17 1037    Subjective Patient reports she continues with hives on knees (nowhere else). she has bands to put on her knees to help with pain for walking.    Limitations Sitting;Walking;Other (comment)  sit to stand   Patient Stated Goals increase strength in legs to walk and perform activities without difficulty.   Currently in Pain? No/denies      Objective; Gait; more erect posture, leans backwards less at hips, decreased trunk rotation  Treatment: Therapeutic Exercise: patient performed exercises with guidance, verbal and tactile cues and demonstration of PT: Sitting: hip  abduction with manual resistance of therapist x 15 reps 5 second holds end range Knee extension with 3# weights at ankles 2 x 15 Knee flexion with redresistive band 2 x 20 Hip adduction with ball with glute sets with VC x 10 reps, hold position x 3 seconds Standing walk along airex beam side stepping x 2 min. Sit to stand off elevated treatment table 23"with controlled motion with concentration on erect posture, not using support of UE's and with hips in neutral position, 2 x 5 reps; left knee popping with all reps therefore discontinued exercise Lateral step ups onto balance beam with toe tap into tandem stance x 15 reps each LE with support of UE on chair for safety Walk on balance stones x 5 sets, staggered stones, using bilateral UE support for safety Balance system for weight shifting activities: side to side, forward and back x 2 min. each followed by limits of stability low and moderate skill levels x 2 with platform unlocked @ #10 with cuing and demonstration to perform with good technique; accuracy improved from 60% up to 84% with repetition with limits of stability  NuStep x 30min. At end of exercises (no charge) seat #7, level #2intensity      Patient response to treatment: Patient demonstrated improved control with balance exercises with repetition. Improved gait posture with decreased lean on hips at end of session. Mild  fatigue with exercises contributed to left knee popping.         PT Education - 01/20/17 1130    Education provided Yes   Education Details HEP: re inforced exercises for strength/balance and safety awareness with standing/walking activties, use support of counter; assisted with proper placement of patellar tendon straps on knees prior to exercise   Person(s) Educated Patient   Methods Explanation   Comprehension Verbalized understanding             PT Long Term Goals - 12/15/16 1032      PT LONG TERM GOAL #1   Title Patient will  demonstrate independence with home exercises for strengthening and balance to prevent future falls and improve confidence with walking on level, uneven surfaces by 01/26/2017   Baseline requires verbal cuing and demonstration to perform exercises correctly    Status New     PT LONG TERM GOAL #2   Title Patient will be able to walk without holding onto front of thighs with improved posture with decreased forward trunk lean to improve community ambulation, decrease fall risk by 01/26/2017   Baseline patient holds onto both thighs when ambulating on level surfaces and stairs   Status New     PT LONG TERM GOAL #3   Title Patient will demonstrate improved function with daily tasks with LEFS score of 50 or better by 01/26/2017   Baseline LEFS 27/80   Status New               Plan - 01/20/17 1140    Clinical Impression Statement Patient demonstrates improving strength and endurance and is progressing well towards all goals with improved ability to walk with less difficuly and transfer sit to stand with greater ease. She continues with left knee pain, decreased strength bilateral LE's and will benefit from continued physical therapy intervention.    Rehab Potential Good   PT Frequency 2x / week   PT Duration 6 weeks   PT Treatment/Interventions Electrical Stimulation;Cryotherapy;Moist Heat;Patient/family education;Neuromuscular re-education;Therapeutic exercise;Manual techniques   PT Next Visit Plan progressive exercises for strength, balance, proprioception;balance system for weight shifting   PT Home Exercise Plan strengthening exercises LE's; , sit to stand for proper alignment, strengthening      Patient will benefit from skilled therapeutic intervention in order to improve the following deficits and impairments:  Decreased strength, Impaired perceived functional ability, Decreased activity tolerance, Decreased endurance, Difficulty walking  Visit Diagnosis: Muscle weakness  (generalized)  Difficulty in walking, not elsewhere classified     Problem List Patient Active Problem List   Diagnosis Date Noted  . Knee pain, bilateral 05/27/2016  . Exertional dyspnea 04/15/2016  . Left carotid bruit 04/14/2016  . Menopausal hot flushes 08/07/2015  . H/O malignant neoplasm of skin 06/25/2015  . Hypothyroidism 01/31/2015  . History of cervical cancer 01/10/2015  . Screening for osteoporosis 01/10/2015  . Chest pain 02/03/2014  . Dyspepsia 02/03/2014  . Hyperlipidemia 02/03/2014  . Medicare annual wellness visit, subsequent 01/19/2014  . History of IBS 01/19/2014  . S/P breast augmentation 01/19/2014  . Hyperlipidemia with target LDL less than 100 02/01/2012  . Colon polyps 01/30/2012  . Low back pain potentially associated with radiculopathy 01/30/2012  . Recurrent colitis due to Clostridium difficile 01/19/2011    Class: History of    Jomarie Longs PT 01/21/2017, 2:23 PM  [facility] [address], [place], [address] Phone: [phone]   Fax:  [phone]  Name: Emily Wu MRN: [id number] Date of Birth: [birth year]

## 2017-01-22 ENCOUNTER — Ambulatory Visit: Payer: Medicare Other | Attending: Orthopedic Surgery | Admitting: Physical Therapy

## 2017-01-22 DIAGNOSIS — M6281 Muscle weakness (generalized): Secondary | ICD-10-CM | POA: Insufficient documentation

## 2017-01-22 DIAGNOSIS — R262 Difficulty in walking, not elsewhere classified: Secondary | ICD-10-CM | POA: Insufficient documentation

## 2017-01-27 ENCOUNTER — Ambulatory Visit: Payer: Medicare Other | Admitting: Physical Therapy

## 2017-01-29 ENCOUNTER — Telehealth: Payer: Self-pay | Admitting: Internal Medicine

## 2017-01-29 ENCOUNTER — Telehealth: Payer: Self-pay | Admitting: *Deleted

## 2017-01-29 MED ORDER — ESTROGENS CONJUGATED 0.625 MG PO TABS: 0.6250 mg | ORAL_TABLET | Freq: Every day | ORAL | 2 refills | Status: DC

## 2017-01-29 MED ORDER — ESTRADIOL 0.5 MG PO TABS: 0.5000 mg | ORAL_TABLET | Freq: Every day | ORAL | 1 refills | Status: DC

## 2017-01-29 NOTE — Telephone Encounter (Signed)
LOV: 05/26/16 Next OV: 04/15/17  Ok to change to generic premarin?

## 2017-01-29 NOTE — Telephone Encounter (Signed)
IT WAS PREVIOUSLY WRITTEN FOR GENERIC WHY ARE THEY NOT READING THE RX?   RESENT WITH BOLD CAPS FILLS AS GENERIC

## 2017-01-29 NOTE — Telephone Encounter (Signed)
Pt informed of below.  Pt wanted the generic premarin sent to [facility] pharmacy. I called [facility] and cancelled rx there. New rx sent to [facility]. See meds.

## 2017-01-29 NOTE — Telephone Encounter (Signed)
Pt called about the medication of PREMARIN 0.625 MG tablet her insurance keeps kicking it out. Pt needs to have a generic brand for the medication. Please advise?  Pharmacy is [facility] [place], [place]  Call pt @ [phone]. Thank you!

## 2017-01-29 NOTE — Telephone Encounter (Signed)
I called the pharmacy and they state Premarin is NOT available in a generic form. The pharmacist states estradiol may be a better alternative. I'm sorry I even have to send you this again. Please advise.

## 2017-01-29 NOTE — Telephone Encounter (Signed)
Got it.  Thank you for talking to the pharmacy .  My mistake,  I have sent estradiol 0.5 mg to pharmacy.  The doseslightly lower than her premarin dose so she can increase dose to 1.5 tablet daily if the ` tablet daily does not control her symptoms

## 2017-01-29 NOTE — Telephone Encounter (Signed)
[facility] stated that the Rx sent over for the premarin is no longer covered under pt insurance, and do not come in a generic form. Pharmacist requested another Rx.

## 2017-01-30 ENCOUNTER — Ambulatory Visit: Payer: Medicare Other | Admitting: Physical Therapy

## 2017-02-03 ENCOUNTER — Ambulatory Visit: Payer: Medicare Other | Admitting: Physical Therapy

## 2017-02-03 ENCOUNTER — Telehealth: Payer: Self-pay | Admitting: *Deleted

## 2017-02-03 NOTE — Telephone Encounter (Signed)
Spoke with pt this morning and explained to the pt that her insurance is no longer covering the premarin but that Dr. Derrel Nip has sent in estradiol 0.5mg . Also explained to the pt that if the 1 tablet doesn't control her symptoms then she could increase to 1.5 tablets. The pt gave a verbal understanding but stated that she was going to her pharmacy and talk to them because she still doesn't understand why her insurance won't cover the medication.

## 2017-02-03 NOTE — Telephone Encounter (Signed)
[employer] has requested PA for the medication Premarin  Contact [phone]

## 2017-02-03 NOTE — Telephone Encounter (Signed)
Spoke with pt and informed her that the estradiol is a smaller dose than the premarin that she was taking but that if the 0.5mg  dose didn't control her symptoms she could take 1.5 tablets. The pt gave a verbal understanding but stated that she was going to go to the pharmacy and talk with them and then give [place] a call back because she stated that she wasn't sure why her insurance was denying the medication.

## 2017-02-05 ENCOUNTER — Encounter: Payer: Self-pay | Admitting: Physical Therapy

## 2017-02-05 ENCOUNTER — Ambulatory Visit: Payer: Medicare Other | Admitting: Physical Therapy

## 2017-02-05 DIAGNOSIS — M6281 Muscle weakness (generalized): Secondary | ICD-10-CM

## 2017-02-05 DIAGNOSIS — R262 Difficulty in walking, not elsewhere classified: Secondary | ICD-10-CM

## 2017-02-05 NOTE — Therapy (Signed)
[facility] [address] [place], [address] Phone: [phone]   Fax:  [phone]  Physical Therapy Treatment  Patient Details  Name: Emily Wu MRN: [id number] Date of Birth: July 08, 1931 Referring Provider: Lyndee Leo PA  Encounter Date: 02/05/2017      PT End of Session - 02/05/17 1120    Visit Number 8   Number of Visits 20   Date for PT Re-Evaluation 03/05/17   Authorization Type 8   Authorization Time Period 10 (G codes)   PT Start Time 1031   PT Stop Time 1114   PT Time Calculation (min) 43 min   Activity Tolerance Patient tolerated treatment well   Behavior During Therapy [facility] for tasks assessed/performed      Past Medical History:  Diagnosis Date  . Arthritis    "knees, shoulders, chest" (02/03/2014)  . Bruises easily   . Cervical cancer ([place]) 1965  . High cholesterol   . Hypertension   . Tendency toward bleeding easily [facility])     Past Surgical History:  Procedure Laterality Date  . ABDOMINAL HYSTERECTOMY  1965  . APPENDECTOMY    . CHOLECYSTECTOMY    . KNEE ARTHROSCOPY Left    [facility]  . SHOULDER OPEN ROTATOR CUFF REPAIR Bilateral 2007,  2010   [facility]  . THROAT SURGERY  2014   "arthritis hugh knot" (02/03/2014)    There were no vitals filed for this visit.      Subjective Assessment - 02/05/17 1052    Subjective Patient reports she is not exercising as she should and feels she will benefit from additional PT with instruction in order to transition to home program. She continues with weakness and had difficulty getting out of her tub the other day and would like to strengthen legs more.    Limitations Sitting;Walking;Other (comment)  sit to stand   Patient Stated Goals increase strength in legs to walk and perform activities without difficulty.   Currently in Pain? No/denies      Objective; Gait; more erect posture, leans backwards less at hips, decreased trunk  rotation Strength: right LE hip flexion 4/5, knee extension 4-/5, flexion 4-/5; left hip flexion 4-/5, knee extension 4-/5, knee flexion 4-/5 Outcome measure: LEFS 34/80  Treatment: Therapeutic Exercise: patient performed exercises with guidance, verbal and tactile cues and demonstration of PT: Sitting: hip abduction with manual resistance of therapist x 15 reps 5 second holds end range Knee extension with 3# weights at ankles 2 x 15 Knee flexion with redresistive band 2 x 20 Hip adduction with ball with glute sets with VC x 10 reps, hold position x 3 seconds Standing walk along airex beam side stepping x 2 min. Sit to stand off elevated treatment table 23"with controlled motion with concentration on erect posture, not using support of UE's and with hips in neutral position, holdiing (2) 4# weights 2 x 5 reps;  Lateral step ups onto balance beam with toe tap into tandem stance x 15 reps each LE with support of UE on chair for safety  NuStep x 37min. At end of exercises (no charge) seat #7, level #2intensity    Patient response to treatment: Patient demonstrated improved technique and motor control with all exercises with minimal cuing and repetition. She was able to perform sit to stand following cuing for proper hip and knee alignment.    Mild fatigue noted at end of session.  PT Education - 02/05/17 1113    Education provided Yes   Education Details HEP; re assessed exercises and added resistance to sit to stand chair squats (2) 4# weights   Person(s) Educated Patient   Methods Explanation;Demonstration;Verbal cues;Handout   Comprehension Verbalized understanding;Returned demonstration;Verbal cues required             PT Long Term Goals - 02/05/17 1124      PT LONG TERM GOAL #1   Title Patient will demonstrate independence with home exercises for strengthening and balance to prevent future falls and improve confidence with walking on level, uneven surfaces  by 03/05/2017   Baseline requires verbal cuing and demonstration to perform exercises correctly    Status Revised     PT LONG TERM GOAL #2   Title Patient will be able to walk without holding onto front of thighs with improved posture with decreased forward trunk lean to improve community ambulation, decrease fall risk by 03/05/2017   Baseline patient holds onto both thighs when ambulating on level surfaces and stairs   Status Revised     PT LONG TERM GOAL #3   Title Patient will demonstrate improved function with daily tasks with LEFS score of 50 or better by 03/05/2017   Baseline LEFS initial: 27/80; 02/05/17 34/80   Status Revised               Plan - 02/05/17 1121    Clinical Impression Statement Patient demonstrates improving strength and endurance and is progressing well towards all goals with improvement in abiltiy to walk with less difficulty and using hands on thighs less frequently. She continues with decreased strength and endurance and has difficulty with sit to stand and getting out of bathtub. Her current LEFS score is 34/80 indicating moderate self perceived disabiltiy. She is still not confident in abiltiy to transition to independent home program and will therefore benefit from continued phyiscal therapy interveniton to improve function and knowledge to allow self management once discharged.    Rehab Potential Good   Clinical Impairments Affecting Rehab Potential (+) motivated, prior level of function (-) age, arthritis bilateral knees   PT Frequency 2x / week   PT Duration 4 weeks   PT Treatment/Interventions Electrical Stimulation;Cryotherapy;Moist Heat;Patient/family education;Neuromuscular re-education;Therapeutic exercise;Manual techniques   PT Next Visit Plan progressive exercises for strength, balance, proprioception;balance system for weight shifting   PT Home Exercise Plan strengthening exercises LE's; , sit to stand for proper alignment, strengthening    Consulted and Agree with Plan of Care Patient      Patient will benefit from skilled therapeutic intervention in order to improve the following deficits and impairments:  Decreased strength, Impaired perceived functional ability, Decreased activity tolerance, Decreased endurance, Difficulty walking  Visit Diagnosis: Muscle weakness (generalized) - Plan: PT plan of care cert/re-cert  Difficulty in walking, not elsewhere classified - Plan: PT plan of care cert/re-cert     Problem List Patient Active Problem List   Diagnosis Date Noted  . Knee pain, bilateral 05/27/2016  . Exertional dyspnea 04/15/2016  . Left carotid bruit 04/14/2016  . Menopausal hot flushes 08/07/2015  . H/O malignant neoplasm of skin 06/25/2015  . Hypothyroidism 01/31/2015  . History of cervical cancer 01/10/2015  . Screening for osteoporosis 01/10/2015  . Chest pain 02/03/2014  . Dyspepsia 02/03/2014  . Hyperlipidemia 02/03/2014  . Medicare annual wellness visit, subsequent 01/19/2014  . History of IBS 01/19/2014  . S/P breast augmentation 01/19/2014  . Hyperlipidemia with target LDL less than  100 02/01/2012  . Colon polyps 01/30/2012  . Low back pain potentially associated with radiculopathy 01/30/2012  . Recurrent colitis due to Clostridium difficile 01/19/2011    Class: History of    Jomarie Longs PT 02/06/2017, 12:33 PM  [facility] [address], [place], [address] Phone: [phone]   Fax:  [phone]  Name: Emily Wu MRN: [id number] Date of Birth: 1931/02/15

## 2017-02-09 ENCOUNTER — Telehealth: Payer: Self-pay | Admitting: *Deleted

## 2017-02-09 DIAGNOSIS — M1712 Unilateral primary osteoarthritis, left knee: Secondary | ICD-10-CM | POA: Diagnosis not present

## 2017-02-09 DIAGNOSIS — M1711 Unilateral primary osteoarthritis, right knee: Secondary | ICD-10-CM | POA: Diagnosis not present

## 2017-02-09 NOTE — Telephone Encounter (Signed)
Patient must try estradiol before Premarin can be approved patient has current script of estradiol. Failure must be documented.

## 2017-02-10 ENCOUNTER — Encounter: Payer: Self-pay | Admitting: Physical Therapy

## 2017-02-10 ENCOUNTER — Ambulatory Visit: Payer: Medicare Other | Admitting: Physical Therapy

## 2017-02-10 DIAGNOSIS — R262 Difficulty in walking, not elsewhere classified: Secondary | ICD-10-CM | POA: Diagnosis not present

## 2017-02-10 DIAGNOSIS — M6281 Muscle weakness (generalized): Secondary | ICD-10-CM

## 2017-02-10 NOTE — Therapy (Signed)
[place] [facility] [address] [place], [address] Phone: [phone]   Fax:  [phone]  Physical Therapy Treatment  Patient Details  Name: Emily Wu MRN: [id number] Date of Birth: [birth year] Referring Provider: Lyndee Leo PA  Encounter Date: 02/10/2017      PT End of Session - 02/10/17 1045    Visit Number 9   Number of Visits 20   Date for PT Re-Evaluation 03/05/17   Authorization Type 9   Authorization Time Period 10 (G codes)   PT Start Time 9211   PT Stop Time 1133   PT Time Calculation (min) 58 min   Activity Tolerance Patient tolerated treatment well   Behavior During Therapy [facility] for tasks assessed/performed      Past Medical History:  Diagnosis Date  . Arthritis    "knees, shoulders, chest" (02/03/2014)  . Bruises easily   . Cervical cancer ([place]) 1965  . High cholesterol   . Hypertension   . Tendency toward bleeding easily [facility])     Past Surgical History:  Procedure Laterality Date  . ABDOMINAL HYSTERECTOMY  1965  . APPENDECTOMY    . CHOLECYSTECTOMY    . KNEE ARTHROSCOPY Left    [facility]  . SHOULDER OPEN ROTATOR CUFF REPAIR Bilateral 2007,  2010   [place]  . THROAT SURGERY  2014   "arthritis hugh knot" (02/03/2014)    There were no vitals filed for this visit.      Subjective Assessment - 02/10/17 1042    Subjective Patient reports she is doing well and was seen by MD and is planning on another injection in both knees in June. The hives on her knees seem to be clearing up slowly. She continues with decreased endurance with walking and being up during the day and still feels weak in legs.    Limitations Sitting;Walking;Other (comment)  sit to stand   Patient Stated Goals increase strength in legs to walk and perform activities without difficulty.   Currently in Pain? No/denies      Objective; Gait; more erect posture, leans backwards lessat hips, places arms behind  hips, decreased trunk rotation Strength: trunk decreased extensor strength   Treatment: Therapeutic Exercise: patient performed exercises with guidance, verbal and tactile cues and demonstration of PT: Sitting: Stabilization forward and back manual resistance given by therapist for flexion/extension x 15 reps then side to side x 10 reps Weight shift in sitting to work on stabilization side to side x 10 reps Pallof press sitting with green resistive band press to chest and extend arms in front x 15 reps hip abduction with manual resistance of therapist x 15 reps 5 second holds end range Knee extension with 4# weights at ankles 2 x 15 Knee flexion with redresistive band 2 x 25 Hip adduction with ball with glute sets with VC x 10 reps, hold position x 3 seconds Standing walk along airex beam side stepping x 2 min. Sit to stand off elevated treatment table 23"with controlled motion with concentration on erect posture, not using support of UE's and with hips in neutral position, holdiing (2) 4# weights x 15 reps;  Walk holding 4# dumbbell in front of hips x 30 seconds to work on trunk extensor strength/stability and prevent poor walking posture  NuStep x 10 -71min. At end of exercises (no charge) seat #7, level #3 intensity  Patient response to treatment: patient demonstrated improved posture and able to stand with less trunk lean  backwards at end of session. Mild to moderate fatigue noted with core and standing, walking exercises. Patient required minimal VC to correct posture and improved motor control with core exercises with repetition.           PT Education - 02/10/17 1044    Education provided Yes   Education Details HEP; re assessed exercises: work on core, weight shifting sitting, trunk posture with standing, walking exercises   Person(s) Educated Patient   Methods Explanation;Demonstration;Verbal cues   Comprehension Verbalized understanding;Returned demonstration;Verbal  cues required             PT Long Term Goals - 02/05/17 1124      PT LONG TERM GOAL #1   Title Patient will demonstrate independence with home exercises for strengthening and balance to prevent future falls and improve confidence with walking on level, uneven surfaces by 03/05/2017   Baseline requires verbal cuing and demonstration to perform exercises correctly    Status Revised     PT LONG TERM GOAL #2   Title Patient will be able to walk without holding onto front of thighs with improved posture with decreased forward trunk lean to improve community ambulation, decrease fall risk by 03/05/2017   Baseline patient holds onto both thighs when ambulating on level surfaces and stairs   Status Revised     PT LONG TERM GOAL #3   Title Patient will demonstrate improved function with daily tasks with LEFS score of 50 or better by 03/05/2017   Baseline LEFS initial: 27/80; 02/05/17 34/80   Status Revised               Plan - 02/10/17 1045    Clinical Impression Statement Patient demonstrates improvement with improved motor control and core strength with exercises and less cuing needed to correct posture and alignment with most exercises. She continues with weakness and decresaed endurance with walking activities and will benefit from additional physical therapy intervention to achieve goals.    Rehab Potential Good   Clinical Impairments Affecting Rehab Potential (+) motivated, prior level of function (-) age, arthritis bilateral knees   PT Frequency 2x / week   PT Duration 4 weeks   PT Treatment/Interventions Electrical Stimulation;Cryotherapy;Moist Heat;Patient/family education;Neuromuscular re-education;Therapeutic exercise;Manual techniques   PT Next Visit Plan progressive exercises for strength, balance, proprioception;balance system for weight shifting   PT Home Exercise Plan strengthening exercises LE's; , sit to stand for proper alignment, strengthening   Consulted and Agree  with Plan of Care Patient      Patient will benefit from skilled therapeutic intervention in order to improve the following deficits and impairments:  Decreased strength, Impaired perceived functional ability, Decreased activity tolerance, Decreased endurance, Difficulty walking  Visit Diagnosis: Muscle weakness (generalized)  Difficulty in walking, not elsewhere classified     Problem List Patient Active Problem List   Diagnosis Date Noted  . Knee pain, bilateral 05/27/2016  . Exertional dyspnea 04/15/2016  . Left carotid bruit 04/14/2016  . Menopausal hot flushes 08/07/2015  . H/O malignant neoplasm of skin 06/25/2015  . Hypothyroidism 01/31/2015  . History of cervical cancer 01/10/2015  . Screening for osteoporosis 01/10/2015  . Chest pain 02/03/2014  . Dyspepsia 02/03/2014  . Hyperlipidemia 02/03/2014  . Medicare annual wellness visit, subsequent 01/19/2014  . History of IBS 01/19/2014  . S/P breast augmentation 01/19/2014  . Hyperlipidemia with target LDL less than 100 02/01/2012  . Colon polyps 01/30/2012  . Low back pain potentially associated with radiculopathy 01/30/2012  .  Recurrent colitis due to Clostridium difficile 01/19/2011    Class: History of    Jomarie Longs PT 02/10/2017, 11:47 AM  [place] [facility] [address] [place], [address] Phone: [phone]   Fax:  [phone]  Name: Emily Wu MRN: [id number] Date of Birth: June 01, 1931

## 2017-02-11 DIAGNOSIS — H5203 Hypermetropia, bilateral: Secondary | ICD-10-CM | POA: Diagnosis not present

## 2017-02-11 DIAGNOSIS — H43813 Vitreous degeneration, bilateral: Secondary | ICD-10-CM | POA: Diagnosis not present

## 2017-02-11 DIAGNOSIS — H25813 Combined forms of age-related cataract, bilateral: Secondary | ICD-10-CM | POA: Diagnosis not present

## 2017-02-11 DIAGNOSIS — H52223 Regular astigmatism, bilateral: Secondary | ICD-10-CM | POA: Diagnosis not present

## 2017-02-12 ENCOUNTER — Ambulatory Visit: Payer: Medicare Other | Admitting: Physical Therapy

## 2017-02-17 ENCOUNTER — Ambulatory Visit: Payer: Medicare Other | Admitting: Physical Therapy

## 2017-02-17 ENCOUNTER — Encounter: Payer: Self-pay | Admitting: Physical Therapy

## 2017-02-17 DIAGNOSIS — R262 Difficulty in walking, not elsewhere classified: Secondary | ICD-10-CM

## 2017-02-17 DIAGNOSIS — M6281 Muscle weakness (generalized): Secondary | ICD-10-CM | POA: Diagnosis not present

## 2017-02-17 NOTE — Therapy (Signed)
[place] [facility] [address] [place], [address] Phone: [phone]   Fax:  [phone]  Physical Therapy Treatment/Discharge Summary  Patient Details  Name: Emily Wu MRN: [id number] Date of Birth: August 18, 1931 Referring Provider: Lyndee Leo PA  Encounter Date: 02/17/2017   Patient began physical therapy 12/15/2016 and attended 10 sessions through 02/17/2017. Patient achieved or partially met all goals and is independent with home exercises and self management. No further therapy recommended at this time.       PT End of Session - 02/17/17 1124    Visit Number 10   Number of Visits 20   Date for PT Re-Evaluation 03/05/17   Authorization Type 10   Authorization Time Period 10 (G codes)   PT Start Time 1040   PT Stop Time 1116   PT Time Calculation (min) 36 min   Activity Tolerance Patient tolerated treatment well   Behavior During Therapy WFL for tasks assessed/performed      Past Medical History:  Diagnosis Date  . Arthritis    "knees, shoulders, chest" (02/03/2014)  . Bruises easily   . Cervical cancer ([place]) 1965  . High cholesterol   . Hypertension   . Tendency toward bleeding easily [facility])     Past Surgical History:  Procedure Laterality Date  . ABDOMINAL HYSTERECTOMY  1965  . APPENDECTOMY    . CHOLECYSTECTOMY    . KNEE ARTHROSCOPY Left    [place]  . SHOULDER OPEN ROTATOR CUFF REPAIR Bilateral 2007,  2010   [place]  . THROAT SURGERY  2014   "arthritis hugh knot" (02/03/2014)    There were no vitals filed for this visit.      Subjective Assessment - 02/17/17 1042    Subjective Patient reports she had increased pain in knees over the weekend. She is still having irritation on both knees that is worse after taking showers. She is compliant with exercises and feels she is now able to continue with exercises independently at home and will still improve strength and walking.     Limitations Sitting;Walking;Other (comment)  sit to stand   Patient Stated Goals increase strength in legs to walk and perform activities without difficulty.   Currently in Pain? No/denies        Objective; Gait; more erect posture, leans backwards lessat hips, places arms behind hips, decreased trunk rotation Strength: trunk decreased extensor strength and motor control  Outcome measures: LEFS 42/80 (initially was 27/80) 5x sit to stand 12.6 seconds (WNL for testing strength of LE's)  Treatment: Therapeutic Exercise: patient performed exercises with guidance, verbal and tactile cues and demonstration of PT: Sitting: Stabilization forward and back manual resistance given by therapist for flexion/extension x 15 reps then side to side x 10 reps Weight shift in sitting to work on stabilization side to side x 10 reps Pallof press sitting with green resistive band press to chest and extend arms in front x 15 reps Hip adduction with ball with glute sets with VC x 10 reps, hold position x 3 seconds Standing walk along airex beam side stepping x 2 min. Sit to stand off elevated treatment table 20"with controlled motion with concentration on erect posture, not using support of UE's and with hips in neutral position Walk holding 4# dumbbell in front of hips x 30 seconds to work on trunk extensor strength/stability and prevent poor walking posture  NuStep x 10 -57mn. At end of exercises (no charge) seat #7, level #  3 intensity  Patient response to treatment: Patient required minimal VC to perform all exercises with good technique and alignment.Patient demonstrated improved posture with decreased trunk lean backwards at end of session. Improved core control with repetition with standing, walking exercises.          PT Education - 03/10/2017 1100    Education provided Yes   Education Details HEP re assessed with any questions answered, reviewed goals and achievements    Person(s) Educated  Patient   Methods Explanation   Comprehension Verbalized understanding             PT Long Term Goals - 03/10/17 1115      PT LONG TERM GOAL #1   Title Patient will demonstrate independence with home exercises for strengthening and balance to prevent future falls and improve confidence with walking on level, uneven surfaces by 03/05/2017   Baseline requires verbal cuing and demonstration to perform exercises correctly    Status Achieved     PT LONG TERM GOAL #2   Title Patient will be able to walk without holding onto front of thighs with improved posture with decreased forward trunk lean to improve community ambulation, decrease fall risk by 03/05/2017   Baseline patient holds onto both thighs when ambulating on level surfaces and stairs   Status Achieved     PT LONG TERM GOAL #3   Title Patient will demonstrate improved function with daily tasks with LEFS score of 50 or better by 03/05/2017   Baseline LEFS initial: 27/80; 02/05/17 34/80; LEFS 2017/03/10 42/80   Status Partially Met               Plan - 03-10-17 1126    Clinical Impression Statement Patient demonstrates good control with quadriceps and improving core control with standing and transfers. She is independent with home program for exercise and should continue to improve with self managment.  she has less than 20% impairment based on LEFS scores, strength, pain, cliinical judgment, 5x sit to stand.    Rehab Potential Good   Clinical Impairments Affecting Rehab Potential (+) motivated, prior level of function (-) age, arthritis bilateral knees   PT Frequency 2x / week   PT Duration 4 weeks   PT Treatment/Interventions Electrical Stimulation;Cryotherapy;Moist Heat;Patient/family education;Neuromuscular re-education;Therapeutic exercise;Manual techniques   PT Next Visit Plan progressive exercises for strength, balance, proprioception;balance system for weight shifting   PT Home Exercise Plan strengthening exercises  LE's; , sit to stand for proper alignment, strengthening   Consulted and Agree with Plan of Care Patient      Patient will benefit from skilled therapeutic intervention in order to improve the following deficits and impairments:  Decreased strength, Impaired perceived functional ability, Decreased activity tolerance, Decreased endurance, Difficulty walking  Visit Diagnosis: Muscle weakness (generalized)  Difficulty in walking, not elsewhere classified       G-Codes - 03-10-17 1120    Functional Assessment Tool Used (Outpatient Only) LEFS, 5x sit to stand, strength deficits, clinical judgment   Functional Limitation Mobility: Walking and moving around   Mobility: Walking and Moving Around Goal Status [phone]) At least 1 percent but less than 20 percent impaired, limited or restricted   Mobility: Walking and Moving Around Discharge Status [phone]) At least 1 percent but less than 20 percent impaired, limited or restricted      Problem List Patient Active Problem List   Diagnosis Date Noted  . Knee pain, bilateral 05/27/2016  . Exertional dyspnea 04/15/2016  . Left carotid bruit  04/14/2016  . Menopausal hot flushes 08/07/2015  . H/O malignant neoplasm of skin 06/25/2015  . Hypothyroidism 01/31/2015  . History of cervical cancer 01/10/2015  . Screening for osteoporosis 01/10/2015  . Chest pain 02/03/2014  . Dyspepsia 02/03/2014  . Hyperlipidemia 02/03/2014  . Medicare annual wellness visit, subsequent 01/19/2014  . History of IBS 01/19/2014  . S/P breast augmentation 01/19/2014  . Hyperlipidemia with target LDL less than 100 02/01/2012  . Colon polyps 01/30/2012  . Low back pain potentially associated with radiculopathy 01/30/2012  . Recurrent colitis due to Clostridium difficile 01/19/2011    Class: History of    Jomarie Longs PT 02/18/2017, 2:16 PM  [place] [facility] [address], [place],  [address] Phone: [phone]   Fax:  [phone]  Name: Emily Wu MRN: [id number] Date of Birth: [birth year]

## 2017-02-19 ENCOUNTER — Encounter: Payer: Medicare Other | Admitting: Physical Therapy

## 2017-03-02 DIAGNOSIS — K648 Other hemorrhoids: Secondary | ICD-10-CM | POA: Diagnosis not present

## 2017-03-02 DIAGNOSIS — R151 Fecal smearing: Secondary | ICD-10-CM | POA: Diagnosis not present

## 2017-03-03 ENCOUNTER — Encounter: Payer: Medicare Other | Admitting: Physical Therapy

## 2017-03-11 ENCOUNTER — Other Ambulatory Visit: Payer: Self-pay | Admitting: Internal Medicine

## 2017-04-06 DIAGNOSIS — Z86010 Personal history of colonic polyps: Secondary | ICD-10-CM | POA: Diagnosis not present

## 2017-04-06 DIAGNOSIS — R197 Diarrhea, unspecified: Secondary | ICD-10-CM | POA: Diagnosis not present

## 2017-04-13 DIAGNOSIS — R197 Diarrhea, unspecified: Secondary | ICD-10-CM | POA: Diagnosis not present

## 2017-04-14 ENCOUNTER — Ambulatory Visit: Payer: Medicare Other

## 2017-04-14 DIAGNOSIS — R197 Diarrhea, unspecified: Secondary | ICD-10-CM | POA: Diagnosis not present

## 2017-04-15 ENCOUNTER — Encounter: Payer: Self-pay | Admitting: Internal Medicine

## 2017-04-15 ENCOUNTER — Ambulatory Visit (INDEPENDENT_AMBULATORY_CARE_PROVIDER_SITE_OTHER): Payer: Medicare Other | Admitting: Internal Medicine

## 2017-04-15 VITALS — BP 138/60 | HR 72 | Temp 98.5°F | Resp 15 | Ht 62.5 in | Wt 124.0 lb

## 2017-04-15 DIAGNOSIS — R634 Abnormal weight loss: Secondary | ICD-10-CM | POA: Diagnosis not present

## 2017-04-15 DIAGNOSIS — E039 Hypothyroidism, unspecified: Secondary | ICD-10-CM | POA: Diagnosis not present

## 2017-04-15 DIAGNOSIS — N951 Menopausal and female climacteric states: Secondary | ICD-10-CM | POA: Diagnosis not present

## 2017-04-15 DIAGNOSIS — R42 Dizziness and giddiness: Secondary | ICD-10-CM | POA: Diagnosis not present

## 2017-04-15 DIAGNOSIS — E7800 Pure hypercholesterolemia, unspecified: Secondary | ICD-10-CM | POA: Diagnosis not present

## 2017-04-15 DIAGNOSIS — K529 Noninfective gastroenteritis and colitis, unspecified: Secondary | ICD-10-CM

## 2017-04-15 DIAGNOSIS — R232 Flushing: Secondary | ICD-10-CM | POA: Diagnosis not present

## 2017-04-15 LAB — CBC WITH DIFFERENTIAL/PLATELET
Basophils Absolute: 0.1 10*3/uL (ref 0.0–0.1)
Basophils Relative: 0.8 % (ref 0.0–3.0)
Eosinophils Absolute: 0.2 10*3/uL (ref 0.0–0.7)
Eosinophils Relative: 2.2 % (ref 0.0–5.0)
HCT: 39.1 % (ref 36.0–46.0)
Hemoglobin: 13.1 g/dL (ref 12.0–15.0)
Lymphocytes Relative: 37.4 % (ref 12.0–46.0)
Lymphs Abs: 3.2 10*3/uL (ref 0.7–4.0)
MCHC: 33.5 g/dL (ref 30.0–36.0)
MCV: 93.2 fl (ref 78.0–100.0)
Monocytes Absolute: 0.8 10*3/uL (ref 0.1–1.0)
Monocytes Relative: 9.4 % (ref 3.0–12.0)
Neutro Abs: 4.3 10*3/uL (ref 1.4–7.7)
Neutrophils Relative %: 50.2 % (ref 43.0–77.0)
Platelets: 255 10*3/uL (ref 150.0–400.0)
RBC: 4.19 Mil/uL (ref 3.87–5.11)
RDW: 13.7 % (ref 11.5–15.5)
WBC: 8.5 10*3/uL (ref 4.0–10.5)

## 2017-04-15 LAB — COMPREHENSIVE METABOLIC PANEL
ALT: 17 U/L (ref 0–35)
AST: 24 U/L (ref 0–37)
Albumin: 4.3 g/dL (ref 3.5–5.2)
Alkaline Phosphatase: 52 U/L (ref 39–117)
BUN: 17 mg/dL (ref 6–23)
CO2: 31 mEq/L (ref 19–32)
Calcium: 9.8 mg/dL (ref 8.4–10.5)
Chloride: 104 mEq/L (ref 96–112)
Creatinine, Ser: 1 mg/dL (ref 0.40–1.20)
GFR: 56.15 mL/min — ABNORMAL LOW (ref >60.00)
Glucose, Bld: 88 mg/dL (ref 70–99)
Potassium: 3.8 mEq/L (ref 3.5–5.1)
Sodium: 142 mEq/L (ref 135–145)
Total Bilirubin: 0.3 mg/dL (ref 0.2–1.2)
Total Protein: 6.9 g/dL (ref 6.0–8.3)

## 2017-04-15 LAB — LIPID PANEL
Cholesterol: 166 mg/dL (ref 0–200)
HDL: 34.6 mg/dL — ABNORMAL LOW (ref >39.00)
NonHDL: 131.09
Total CHOL/HDL Ratio: 5
Triglycerides: 207 mg/dL — ABNORMAL HIGH (ref 0.0–149.0)
VLDL: 41.4 mg/dL — ABNORMAL HIGH (ref 0.0–40.0)

## 2017-04-15 LAB — VITAMIN B12: Vitamin B-12: 436 pg/mL (ref 211–911)

## 2017-04-15 LAB — TSH: TSH: 3.31 u[IU]/mL (ref 0.35–4.50)

## 2017-04-15 MED ORDER — ESTRADIOL 1 MG PO TABS: 1.0000 mg | ORAL_TABLET | Freq: Every day | ORAL | 5 refills | Status: DC

## 2017-04-15 NOTE — Patient Instructions (Addendum)
I have increased your estradiol dose to 1.0  Mg to help with hot flushes,  If this doesn't help, , they may be coming from the GI problem   The 24 hour urine collection is to look for the hormone that is sometimes over produced by a tumor in the GI tract (carcinoid) so It will help Dr Earlean Shawl determine what is gong on   Please get your mammogram done this year   The ShingRx vaccine will be available in about 6 months and IS ADVISED for all interested adults over 50 to prevent shingles
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
## 2017-04-16 ENCOUNTER — Other Ambulatory Visit: Payer: Medicare Other

## 2017-04-16 DIAGNOSIS — R232 Flushing: Secondary | ICD-10-CM | POA: Diagnosis not present

## 2017-04-16 LAB — LDL CHOLESTEROL, DIRECT: Direct LDL: 87 mg/dL

## 2017-04-18 DIAGNOSIS — K529 Noninfective gastroenteritis and colitis, unspecified: Secondary | ICD-10-CM | POA: Insufficient documentation

## 2017-04-18 DIAGNOSIS — R197 Diarrhea, unspecified: Secondary | ICD-10-CM | POA: Insufficient documentation

## 2017-04-18 DIAGNOSIS — K909 Intestinal malabsorption, unspecified: Secondary | ICD-10-CM | POA: Insufficient documentation

## 2017-04-18 LAB — METHYLMALONIC ACID, SERUM: Methylmalonic Acid, Quant: 175 nmol/L (ref 87–318)

## 2017-04-18 NOTE — Assessment & Plan Note (Signed)
i have increased her dose of premarin  to 1.0  Mg daily  For persistent flushing

## 2017-04-18 NOTE — Assessment & Plan Note (Signed)
Thyroid function is WNL on current dose.  No current changes needed.   Lab Results  Component Value Date   TSH 3.31 04/15/2017    

## 2017-04-18 NOTE — Assessment & Plan Note (Signed)
With weight loss and flushing ,  Which may be due to menopause, but may suggest carcinoid syndrome.  24 hour urine collection for 5H1AA.

## 2017-04-19 LAB — 5 HIAA, QUANTITATIVE, URINE, 24 HOUR: 5-HIAA, 24 Hr Urine: 2.3 mg/24 h (ref <=6.0)

## 2017-04-20 ENCOUNTER — Encounter: Payer: Self-pay | Admitting: Internal Medicine

## 2017-04-24 ENCOUNTER — Other Ambulatory Visit: Payer: Self-pay | Admitting: Internal Medicine

## 2017-04-30 DIAGNOSIS — Z86010 Personal history of colonic polyps: Secondary | ICD-10-CM | POA: Diagnosis not present

## 2017-04-30 DIAGNOSIS — K648 Other hemorrhoids: Secondary | ICD-10-CM | POA: Diagnosis not present

## 2017-04-30 DIAGNOSIS — Z09 Encounter for follow-up examination after completed treatment for conditions other than malignant neoplasm: Secondary | ICD-10-CM | POA: Diagnosis not present

## 2017-04-30 DIAGNOSIS — K5730 Diverticulosis of large intestine without perforation or abscess without bleeding: Secondary | ICD-10-CM | POA: Diagnosis not present

## 2017-04-30 DIAGNOSIS — Z1211 Encounter for screening for malignant neoplasm of colon: Secondary | ICD-10-CM | POA: Diagnosis not present

## 2017-05-04 DIAGNOSIS — R151 Fecal smearing: Secondary | ICD-10-CM | POA: Diagnosis not present

## 2017-05-04 NOTE — Telephone Encounter (Signed)
Mailed unread message to patient, thanks 

## 2017-05-05 ENCOUNTER — Telehealth: Payer: Self-pay | Admitting: Internal Medicine

## 2017-05-05 NOTE — Telephone Encounter (Signed)
Spoke to pt to try and reschedule missed AWV appt. Pt does not want to schedule right now and will call back when she is ready

## 2017-05-07 NOTE — Telephone Encounter (Signed)
Pt called requesting that these labs mailed to her. Please advise, thank you!

## 2017-05-10 LAB — HM COLONOSCOPY

## 2017-05-11 NOTE — Telephone Encounter (Signed)
Labs have been mailed per pt request.  

## 2017-05-21 DIAGNOSIS — M1712 Unilateral primary osteoarthritis, left knee: Secondary | ICD-10-CM | POA: Diagnosis not present

## 2017-05-21 DIAGNOSIS — M1711 Unilateral primary osteoarthritis, right knee: Secondary | ICD-10-CM | POA: Diagnosis not present

## 2017-05-22 DIAGNOSIS — L538 Other specified erythematous conditions: Secondary | ICD-10-CM | POA: Diagnosis not present

## 2017-05-22 DIAGNOSIS — Z08 Encounter for follow-up examination after completed treatment for malignant neoplasm: Secondary | ICD-10-CM | POA: Diagnosis not present

## 2017-05-22 DIAGNOSIS — Z85828 Personal history of other malignant neoplasm of skin: Secondary | ICD-10-CM | POA: Diagnosis not present

## 2017-05-22 DIAGNOSIS — L820 Inflamed seborrheic keratosis: Secondary | ICD-10-CM | POA: Diagnosis not present

## 2017-05-22 DIAGNOSIS — L298 Other pruritus: Secondary | ICD-10-CM | POA: Diagnosis not present

## 2017-05-22 DIAGNOSIS — L821 Other seborrheic keratosis: Secondary | ICD-10-CM | POA: Diagnosis not present

## 2017-06-01 DIAGNOSIS — M1712 Unilateral primary osteoarthritis, left knee: Secondary | ICD-10-CM | POA: Diagnosis not present

## 2017-06-01 DIAGNOSIS — M65861 Other synovitis and tenosynovitis, right lower leg: Secondary | ICD-10-CM | POA: Diagnosis not present

## 2017-06-01 DIAGNOSIS — M1711 Unilateral primary osteoarthritis, right knee: Secondary | ICD-10-CM | POA: Diagnosis not present

## 2017-06-09 DIAGNOSIS — H43813 Vitreous degeneration, bilateral: Secondary | ICD-10-CM | POA: Diagnosis not present

## 2017-06-09 DIAGNOSIS — H25813 Combined forms of age-related cataract, bilateral: Secondary | ICD-10-CM | POA: Diagnosis not present

## 2017-06-09 DIAGNOSIS — H5203 Hypermetropia, bilateral: Secondary | ICD-10-CM | POA: Diagnosis not present

## 2017-06-09 DIAGNOSIS — H52223 Regular astigmatism, bilateral: Secondary | ICD-10-CM | POA: Diagnosis not present

## 2017-06-15 ENCOUNTER — Other Ambulatory Visit: Payer: Self-pay | Admitting: Internal Medicine

## 2017-06-17 DIAGNOSIS — M1712 Unilateral primary osteoarthritis, left knee: Secondary | ICD-10-CM | POA: Diagnosis not present

## 2017-06-17 DIAGNOSIS — M1711 Unilateral primary osteoarthritis, right knee: Secondary | ICD-10-CM | POA: Diagnosis not present

## 2017-06-26 DIAGNOSIS — G8929 Other chronic pain: Secondary | ICD-10-CM | POA: Diagnosis not present

## 2017-06-26 DIAGNOSIS — M25562 Pain in left knee: Secondary | ICD-10-CM | POA: Diagnosis not present

## 2017-06-26 DIAGNOSIS — G894 Chronic pain syndrome: Secondary | ICD-10-CM | POA: Diagnosis not present

## 2017-06-26 DIAGNOSIS — M25561 Pain in right knee: Secondary | ICD-10-CM | POA: Diagnosis not present

## 2017-06-26 DIAGNOSIS — M170 Bilateral primary osteoarthritis of knee: Secondary | ICD-10-CM | POA: Diagnosis not present

## 2017-07-29 ENCOUNTER — Ambulatory Visit: Payer: Medicare Other | Admitting: Internal Medicine

## 2017-07-29 DIAGNOSIS — G894 Chronic pain syndrome: Secondary | ICD-10-CM | POA: Diagnosis not present

## 2017-07-29 DIAGNOSIS — M25561 Pain in right knee: Secondary | ICD-10-CM | POA: Diagnosis not present

## 2017-07-29 DIAGNOSIS — M25562 Pain in left knee: Secondary | ICD-10-CM | POA: Diagnosis not present

## 2017-07-29 DIAGNOSIS — M170 Bilateral primary osteoarthritis of knee: Secondary | ICD-10-CM | POA: Diagnosis not present

## 2017-07-29 DIAGNOSIS — G8929 Other chronic pain: Secondary | ICD-10-CM | POA: Diagnosis not present

## 2017-08-10 ENCOUNTER — Encounter: Payer: Self-pay | Admitting: Internal Medicine

## 2017-08-10 ENCOUNTER — Ambulatory Visit (INDEPENDENT_AMBULATORY_CARE_PROVIDER_SITE_OTHER): Payer: Medicare Other | Admitting: Internal Medicine

## 2017-08-10 DIAGNOSIS — N951 Menopausal and female climacteric states: Secondary | ICD-10-CM | POA: Diagnosis not present

## 2017-08-10 DIAGNOSIS — K529 Noninfective gastroenteritis and colitis, unspecified: Secondary | ICD-10-CM

## 2017-08-10 DIAGNOSIS — E034 Atrophy of thyroid (acquired): Secondary | ICD-10-CM | POA: Diagnosis not present

## 2017-08-10 MED ORDER — ESTRADIOL 0.1 MG/24HR TD PTTW: 1.0000 | MEDICATED_PATCH | TRANSDERMAL | 12 refills | Status: DC

## 2017-08-10 MED ORDER — PANTOPRAZOLE SODIUM 40 MG PO TBEC: 40.0000 mg | DELAYED_RELEASE_TABLET | Freq: Every day | ORAL | 1 refills | Status: DC

## 2017-08-10 NOTE — Patient Instructions (Addendum)
We are trying a estrogen patch instead of the estrogen tablet for a month  To see if it helps your hot flashes .  If no better   Let me know

## 2017-08-10 NOTE — Progress Notes (Signed)
Subjective:  Patient ID: Emily Wu, female    DOB: [birth year]  Age: 83 y.o. MRN: [id number]  CC: Diagnoses of Menopausal hot flushes, Hypothyroidism due to acquired atrophy of thyroid, and Postprandial diarrhea were pertinent to this visit.  HPI ABRIELLE Wu presents for follow up on multiple chronic problems and symptoms  Persistent hot flashes occurring throughout the day. Drenching head.  Not occurring at night.  Taking estradiol 1 mg at night.  Chest x ray normal,   5 H1AA  normal   Colonoscopy normal Not losing weight . Some emotional stressors due to husband's declining health and undiagnosed cognitive decline   Diarrhea workup negative by [facility],  .colonoscopy done .    Weight has been stable since the loss associated with recurrent episodes of C dificile colitis.  Denies abdominal bloating,  improved with VSLL (probiotic)   Discussed trial of effexor or lexapro  Does not want to take a medication that is going to "change her mood." discussed alternative mode of estrogen delivery.   Other issues: persistent Bilateral knee pain due to DJD.  seeing Pain clinic at [facility].  Did not improve with superior and inferior geniculate nerve blocks  Done on Sept 5.  Planning to have RFA under conscious sedation   Outpatient Medications Prior to Visit  Medication Sig Dispense Refill  . acetaminophen (TYLENOL) 650 MG CR tablet Take by mouth.    Marland Kitchen amitriptyline (ELAVIL) 25 MG tablet TAKE ONE TABLET EVERY DAY 90 tablet 1  . atorvastatin (LIPITOR) 20 MG tablet TAKE ONE TABLET EVERY DAY 90 tablet 3  . liothyronine (CYTOMEL) 5 MCG tablet TAKE TWO TABLETS BY MOUTH EVERY DAY 180 tablet 2  . meloxicam (MOBIC) 15 MG tablet TAKE 1 TABLET BY MOUTH EVERY DAY. NEED APPT WITH MD IN [place]. 90 tablet 0  . Multiple Vitamin (MULTIVITAMIN) tablet Take 1 tablet by mouth daily.    . ondansetron (ZOFRAN) 4 MG tablet Take 1 tablet (4 mg total) by mouth daily as needed for nausea or vomiting. 14 tablet  0  . Probiotic Product (VSL#3) CAPS Take 2 capsules by mouth 2 (two) times daily.    Marland Kitchen estradiol (ESTRACE) 0.5 MG tablet TAKE 1 TABLET BY MOUTH DAILY 90 tablet 3  . estradiol (ESTRACE) 1 MG tablet Take 1 tablet (1 mg total) by mouth daily. 30 tablet 5  . pantoprazole (PROTONIX) 40 MG tablet TAKE ONE TABLET EVERY DAY 90 tablet 1   No facility-administered medications prior to visit.     Review of Systems;  Patient denies headache, fevers, malaise, unintentional weight loss, skin rash, eye pain, sinus congestion and sinus pain, sore throat, dysphagia,  hemoptysis , cough, dyspnea, wheezing, chest pain, palpitations, orthopnea, edema, abdominal pain, nausea, melena, diarrhea, constipation, flank pain, dysuria, hematuria, urinary  Frequency, nocturia, numbness, tingling, seizures,  Focal weakness, Loss of consciousness,  Tremor, insomnia, depression, anxiety, and suicidal ideation.      Objective:  BP 134/76 (BP Location: Left Arm, Patient Position: Sitting, Cuff Size: Normal)   Pulse 87   Temp (!) 97.4 F (36.3 C) (Oral)   Resp 16   Ht 5' 2.5" (1.588 m)   Wt 121 lb 3.2 oz (55 kg)   SpO2 90%   BMI 21.81 kg/m   BP Readings from Last 3 Encounters:  08/10/17 134/76  04/15/17 138/60  11/06/16 (!) 150/77    Wt Readings from Last 3 Encounters:  08/10/17 121 lb 3.2 oz (55 kg)  04/15/17 124 lb (56.2  kg)  11/06/16 125 lb (56.7 kg)    General appearance: alert, cooperative and appears stated age Ears: normal TM's and external ear canals both ears Throat: lips, mucosa, and tongue normal; teeth and gums normal Neck: no adenopathy, no carotid bruit, supple, symmetrical, trachea midline and thyroid not enlarged, symmetric, no tenderness/mass/nodules Back: symmetric, no curvature. ROM normal. No CVA tenderness. Lungs: clear to auscultation bilaterally Heart: regular rate and rhythm, S1, S2 normal, no murmur, click, rub or gallop Abdomen: soft, non-tender; bowel sounds normal; no masses,   no organomegaly Pulses: 2+ and symmetric Skin: Skin color, texture, turgor normal. No rashes or lesions Lymph nodes: Cervical, supraclavicular, and axillary nodes normal.  No results found for: HGBA1C  Lab Results  Component Value Date   CREATININE 1.00 04/15/2017   CREATININE 0.96 11/06/2016   CREATININE 1.07 01/03/2016    Lab Results  Component Value Date   WBC 8.5 04/15/2017   HGB 13.1 04/15/2017   HCT 39.1 04/15/2017   PLT 255.0 04/15/2017   GLUCOSE 88 04/15/2017   CHOL 166 04/15/2017   TRIG 207.0 (H) 04/15/2017   HDL 34.60 (L) 04/15/2017   LDLDIRECT 87.0 04/15/2017   LDLCALC 81 03/09/2012   ALT 17 04/15/2017   AST 24 04/15/2017   NA 142 04/15/2017   K 3.8 04/15/2017   CL 104 04/15/2017   CREATININE 1.00 04/15/2017   BUN 17 04/15/2017   CO2 31 04/15/2017   TSH 3.31 04/15/2017    Dg Chest 2 View  Result Date: 11/06/2016 CLINICAL DATA:  83 year old female with nausea and vomiting. Concern for pneumonia. EXAM: CHEST  2 VIEW COMPARISON:  Chest radiograph dated 04/14/2016 FINDINGS: The heart size and mediastinal contours are within normal limits. Both lungs are clear. The visualized skeletal structures are unremarkable. IMPRESSION: No active cardiopulmonary disease. Electronically Signed   By: Anner Crete M.D.   On: 11/06/2016 04:48    Assessment & Plan:   Problem List Items Addressed This Visit    Hypothyroidism    Thyroid function is WNL on current dose.  No current changes needed.   Lab Results  Component Value Date   TSH 3.31 04/15/2017         Menopausal hot flushes    No improvement despite increased dose of estrogen via oral tablets.  She is intolerant of symptoms,  Not willing to try lexapro or effexor.  Trial of estrogen transdermal       Postprandial diarrhea    Improving with use of different probiotic,  Colonoscopy normal by [facility] June 2018        A total of 25 minutes of face to face time was spent with patient more than half of  which was spent in counselling about the above mentioned conditions  and coordination of care  I have discontinued Ms. Lacroix's estradiol and estradiol. I have also changed her pantoprazole. Additionally, I am having her start on estradiol. Lastly, I am having her maintain her multivitamin, liothyronine, ondansetron, meloxicam, acetaminophen, VSL#3, atorvastatin, and amitriptyline.  Meds ordered this encounter  Medications  . estradiol (VIVELLE-DOT) 0.1 MG/24HR patch    Sig: Place 1 patch (0.1 mg total) onto the skin 2 (two) times a week.    Dispense:  8 patch    Refill:  12  . pantoprazole (PROTONIX) 40 MG tablet    Sig: Take 1 tablet (40 mg total) by mouth daily.    Dispense:  90 tablet    Refill:  1    Medications Discontinued  During This Encounter  Medication Reason  . estradiol (ESTRACE) 1 MG tablet   . estradiol (ESTRACE) 0.5 MG tablet   . pantoprazole (PROTONIX) 40 MG tablet Reorder    Follow-up: No Follow-up on file.   Crecencio Mc, MD

## 2017-08-11 ENCOUNTER — Encounter: Payer: Self-pay | Admitting: Internal Medicine

## 2017-08-11 NOTE — Assessment & Plan Note (Signed)
No improvement despite increased dose of estrogen via oral tablets.  She is intolerant of symptoms,  Not willing to try lexapro or effexor.  Trial of estrogen transdermal

## 2017-08-11 NOTE — Assessment & Plan Note (Signed)
Thyroid function is WNL on current dose.  No current changes needed.   Lab Results  Component Value Date   TSH 3.31 04/15/2017

## 2017-08-11 NOTE — Assessment & Plan Note (Addendum)
Improving with use of different probiotic,  Colonoscopy normal by [facility] June 2018

## 2017-09-07 DIAGNOSIS — M1712 Unilateral primary osteoarthritis, left knee: Secondary | ICD-10-CM | POA: Diagnosis not present

## 2017-09-07 DIAGNOSIS — M25562 Pain in left knee: Secondary | ICD-10-CM | POA: Diagnosis not present

## 2017-09-07 DIAGNOSIS — G894 Chronic pain syndrome: Secondary | ICD-10-CM | POA: Diagnosis not present

## 2017-09-07 DIAGNOSIS — M25561 Pain in right knee: Secondary | ICD-10-CM | POA: Diagnosis not present

## 2017-09-10 ENCOUNTER — Other Ambulatory Visit: Payer: Self-pay | Admitting: Internal Medicine

## 2017-09-25 DIAGNOSIS — Z08 Encounter for follow-up examination after completed treatment for malignant neoplasm: Secondary | ICD-10-CM | POA: Diagnosis not present

## 2017-09-25 DIAGNOSIS — D485 Neoplasm of uncertain behavior of skin: Secondary | ICD-10-CM | POA: Diagnosis not present

## 2017-09-25 DIAGNOSIS — Z85828 Personal history of other malignant neoplasm of skin: Secondary | ICD-10-CM | POA: Diagnosis not present

## 2017-09-25 DIAGNOSIS — L821 Other seborrheic keratosis: Secondary | ICD-10-CM | POA: Diagnosis not present

## 2017-09-25 DIAGNOSIS — L249 Irritant contact dermatitis, unspecified cause: Secondary | ICD-10-CM | POA: Diagnosis not present

## 2017-09-28 DIAGNOSIS — C44319 Basal cell carcinoma of skin of other parts of face: Secondary | ICD-10-CM | POA: Diagnosis not present

## 2017-10-07 ENCOUNTER — Telehealth: Payer: Self-pay | Admitting: Internal Medicine

## 2017-10-07 DIAGNOSIS — E034 Atrophy of thyroid (acquired): Secondary | ICD-10-CM

## 2017-10-07 DIAGNOSIS — E785 Hyperlipidemia, unspecified: Secondary | ICD-10-CM

## 2017-10-07 DIAGNOSIS — E559 Vitamin D deficiency, unspecified: Secondary | ICD-10-CM

## 2017-10-07 DIAGNOSIS — R5383 Other fatigue: Secondary | ICD-10-CM

## 2017-10-07 NOTE — Telephone Encounter (Signed)
Pt made an apt for her cpe and wanted to know if she needed lab work prior to her appt in may

## 2017-10-08 ENCOUNTER — Other Ambulatory Visit: Payer: Self-pay | Admitting: Internal Medicine

## 2017-10-21 NOTE — Telephone Encounter (Signed)
Labs have been ordered. Pt is scheduled for a lab appt. Pt is aware of appt date and time.

## 2017-12-03 ENCOUNTER — Other Ambulatory Visit: Payer: Self-pay | Admitting: Internal Medicine

## 2017-12-03 DIAGNOSIS — Z79899 Other long term (current) drug therapy: Secondary | ICD-10-CM

## 2017-12-03 DIAGNOSIS — E785 Hyperlipidemia, unspecified: Secondary | ICD-10-CM

## 2017-12-03 DIAGNOSIS — E034 Atrophy of thyroid (acquired): Secondary | ICD-10-CM

## 2017-12-03 NOTE — Telephone Encounter (Signed)
Pt has appt in May. Okay to refill? Last refill message states pt need appt in February.

## 2017-12-04 NOTE — Telephone Encounter (Signed)
Pt notified & will call back to schedule a fasting lab appt.

## 2017-12-04 NOTE — Telephone Encounter (Signed)
No refill  until she has a CMET and a fasting lipid and a tsh (ordered) .

## 2017-12-11 ENCOUNTER — Other Ambulatory Visit: Payer: Medicare Other

## 2017-12-14 ENCOUNTER — Other Ambulatory Visit (INDEPENDENT_AMBULATORY_CARE_PROVIDER_SITE_OTHER): Payer: Medicare Other

## 2017-12-14 ENCOUNTER — Other Ambulatory Visit: Payer: Self-pay | Admitting: Internal Medicine

## 2017-12-14 DIAGNOSIS — E034 Atrophy of thyroid (acquired): Secondary | ICD-10-CM | POA: Diagnosis not present

## 2017-12-14 DIAGNOSIS — E785 Hyperlipidemia, unspecified: Secondary | ICD-10-CM | POA: Diagnosis not present

## 2017-12-14 DIAGNOSIS — Z79899 Other long term (current) drug therapy: Secondary | ICD-10-CM

## 2017-12-14 LAB — COMPREHENSIVE METABOLIC PANEL
ALT: 13 U/L (ref 0–35)
AST: 18 U/L (ref 0–37)
Albumin: 3.9 g/dL (ref 3.5–5.2)
Alkaline Phosphatase: 55 U/L (ref 39–117)
BUN: 19 mg/dL (ref 6–23)
CO2: 29 mEq/L (ref 19–32)
Calcium: 9.3 mg/dL (ref 8.4–10.5)
Chloride: 102 mEq/L (ref 96–112)
Creatinine, Ser: 0.93 mg/dL (ref 0.40–1.20)
GFR: 60.96 mL/min (ref >60.00)
Glucose, Bld: 86 mg/dL (ref 70–99)
Potassium: 4 mEq/L (ref 3.5–5.1)
Sodium: 138 mEq/L (ref 135–145)
Total Bilirubin: 0.5 mg/dL (ref 0.2–1.2)
Total Protein: 6.6 g/dL (ref 6.0–8.3)

## 2017-12-14 LAB — LIPID PANEL
Cholesterol: 139 mg/dL (ref 0–200)
HDL: 33.7 mg/dL — ABNORMAL LOW (ref >39.00)
LDL Cholesterol: 75 mg/dL (ref 0–99)
NonHDL: 105.77
Total CHOL/HDL Ratio: 4
Triglycerides: 152 mg/dL — ABNORMAL HIGH (ref 0.0–149.0)
VLDL: 30.4 mg/dL (ref 0.0–40.0)

## 2017-12-14 LAB — TSH: TSH: 4.15 u[IU]/mL (ref 0.35–4.50)

## 2017-12-16 ENCOUNTER — Encounter: Payer: Self-pay | Admitting: Internal Medicine

## 2017-12-18 ENCOUNTER — Other Ambulatory Visit: Payer: Self-pay

## 2017-12-18 ENCOUNTER — Emergency Department
Admission: EM | Admit: 2017-12-18 | Discharge: 2017-12-18 | Disposition: A | Discharge status: Home / Self Care | Payer: Medicare Other | Attending: Emergency Medicine | Admitting: Emergency Medicine

## 2017-12-18 ENCOUNTER — Emergency Department: Payer: Medicare Other

## 2017-12-18 ENCOUNTER — Encounter: Payer: Self-pay | Admitting: Emergency Medicine

## 2017-12-18 DIAGNOSIS — N200 Calculus of kidney: Secondary | ICD-10-CM | POA: Diagnosis not present

## 2017-12-18 DIAGNOSIS — Z8541 Personal history of malignant neoplasm of cervix uteri: Secondary | ICD-10-CM | POA: Insufficient documentation

## 2017-12-18 DIAGNOSIS — Z87891 Personal history of nicotine dependence: Secondary | ICD-10-CM | POA: Diagnosis not present

## 2017-12-18 DIAGNOSIS — E039 Hypothyroidism, unspecified: Secondary | ICD-10-CM | POA: Diagnosis not present

## 2017-12-18 DIAGNOSIS — R109 Unspecified abdominal pain: Secondary | ICD-10-CM

## 2017-12-18 DIAGNOSIS — K5730 Diverticulosis of large intestine without perforation or abscess without bleeding: Secondary | ICD-10-CM | POA: Diagnosis not present

## 2017-12-18 DIAGNOSIS — M25561 Pain in right knee: Secondary | ICD-10-CM | POA: Diagnosis not present

## 2017-12-18 DIAGNOSIS — I10 Essential (primary) hypertension: Secondary | ICD-10-CM | POA: Diagnosis not present

## 2017-12-18 DIAGNOSIS — R1030 Lower abdominal pain, unspecified: Secondary | ICD-10-CM | POA: Insufficient documentation

## 2017-12-18 DIAGNOSIS — M549 Dorsalgia, unspecified: Secondary | ICD-10-CM | POA: Diagnosis not present

## 2017-12-18 LAB — CBC
HCT: 40.1 % (ref 35.0–47.0)
Hemoglobin: 13.6 g/dL (ref 12.0–16.0)
MCH: 31.1 pg (ref 26.0–34.0)
MCHC: 33.9 g/dL (ref 32.0–36.0)
MCV: 91.9 fL (ref 80.0–100.0)
Platelets: 245 10*3/uL (ref 150–440)
RBC: 4.36 MIL/uL (ref 3.80–5.20)
RDW: 13.5 % (ref 11.5–14.5)
WBC: 9.7 10*3/uL (ref 3.6–11.0)

## 2017-12-18 LAB — URINALYSIS, COMPLETE (UACMP) WITH MICROSCOPIC
Bilirubin Urine: NEGATIVE
Glucose, UA: NEGATIVE mg/dL
Ketones, ur: 5 mg/dL — AB
Nitrite: NEGATIVE
Protein, ur: NEGATIVE mg/dL
Specific Gravity, Urine: 1.018 (ref 1.005–1.030)
pH: 5 (ref 5.0–8.0)

## 2017-12-18 LAB — BASIC METABOLIC PANEL
Anion gap: 8 (ref 5–15)
BUN: 17 mg/dL (ref 6–20)
CO2: 24 mmol/L (ref 22–32)
Calcium: 8.9 mg/dL (ref 8.9–10.3)
Chloride: 104 mmol/L (ref 101–111)
Creatinine, Ser: 1.02 mg/dL — ABNORMAL HIGH (ref 0.44–1.00)
GFR calc Af Amer: 57 mL/min — ABNORMAL LOW (ref >60)
GFR calc non Af Amer: 49 mL/min — ABNORMAL LOW (ref >60)
Glucose, Bld: 107 mg/dL — ABNORMAL HIGH (ref 65–99)
Potassium: 4.3 mmol/L (ref 3.5–5.1)
Sodium: 136 mmol/L (ref 135–145)

## 2017-12-18 MED ORDER — DOCUSATE SODIUM 100 MG PO CAPS: 100.0000 mg | ORAL_CAPSULE | Freq: Every day | ORAL | 2 refills | Status: DC | PRN

## 2017-12-18 MED ORDER — OXYCODONE-ACETAMINOPHEN 5-325 MG PO TABS: 1.0000 | ORAL_TABLET | Freq: Four times a day (QID) | ORAL | 0 refills | Status: DC | PRN

## 2017-12-18 MED ORDER — ACETAMINOPHEN 500 MG PO TABS
1000.0000 mg | ORAL_TABLET | Freq: Once | ORAL | Status: AC
  Administered 2017-12-18: 1000 mg via ORAL
  Filled 2017-12-18: qty 2

## 2017-12-18 NOTE — ED Notes (Signed)
Patient was tellijng radiology staff that her right knee hurts.  Says she stepped out of the ambulance to come in here and stepped wrong.  Says weight bearing does not increase pain, but bending the knee does.

## 2017-12-18 NOTE — ED Provider Notes (Signed)
[facility] Emergency Department Provider Note       Time seen: ----------------------------------------- 2:42 PM on 12/18/2017 -----------------------------------------   I have reviewed the triage vital signs and the nursing notes.  HISTORY   Chief Complaint Flank Pain    HPI Emily Wu is a 84 y.o. female with a history of arthritis, cervical cancer, hyperlipidemia and hypertension who presents to the ED for bilateral flank pain that started during the night.  Patient states she has a history of kidney stones and this feels similar although she has not had one she took Mobic today without any improvement.  She also complains of pain from her waist down.  She denies fevers, chills, chest pain, shortness of breath, vomiting or diarrhea.  Past Medical History:  Diagnosis Date  . Arthritis    "knees, shoulders, chest" (02/03/2014)  . Bruises easily   . Cervical cancer ([place]) 1965  . High cholesterol   . Hypertension   . Tendency toward bleeding easily [facility])     Patient Active Problem List   Diagnosis Date Noted  . Postprandial diarrhea 04/18/2017  . Knee pain, bilateral 05/27/2016  . Exertional dyspnea 04/15/2016  . Left carotid bruit 04/14/2016  . Menopausal hot flushes 08/07/2015  . H/O malignant neoplasm of skin 06/25/2015  . Hypothyroidism 01/31/2015  . History of cervical cancer 01/10/2015  . Screening for osteoporosis 01/10/2015  . Hyperlipidemia 02/03/2014  . Medicare annual wellness visit, subsequent 01/19/2014  . History of IBS 01/19/2014  . S/P breast augmentation 01/19/2014  . Hyperlipidemia with target LDL less than 100 02/01/2012  . Colon polyps 01/30/2012  . Low back pain potentially associated with radiculopathy 01/30/2012  . Recurrent colitis due to Clostridium difficile 01/19/2011    Class: History of    Past Surgical History:  Procedure Laterality Date  . ABDOMINAL HYSTERECTOMY  1965  . APPENDECTOMY    .  CHOLECYSTECTOMY    . KNEE ARTHROSCOPY Left    [facility]  . SHOULDER OPEN ROTATOR CUFF REPAIR Bilateral 2007,  2010   [facility]  . THROAT SURGERY  2014   "arthritis hugh knot" (02/03/2014)    Allergies Tramadol; Amoxicillin; Promethazine hcl; and Sulfa drugs cross reactors  Social History Social History   Tobacco Use  . Smoking status: Former Smoker    Packs/day: 2.00    Years: 7.00    Pack years: 14.00    Types: Cigarettes  . Smokeless tobacco: Never Used  . Tobacco comment: 02/03/2014 "quit smoking in the 1960's"  Substance Use Topics  . Alcohol use: No  . Drug use: No    Review of Systems Constitutional: Negative for fever. Cardiovascular: Negative for chest pain. Respiratory: Negative for shortness of breath. Gastrointestinal: Positive for flank pain Genitourinary: Negative for dysuria. Musculoskeletal: Positive for back pain Skin: Negative for rash. Neurological: Negative for headaches, focal weakness or numbness.  All systems negative/normal/unremarkable except as stated in the HPI  ____________________________________________   PHYSICAL EXAM:  VITAL SIGNS: ED Triage Vitals  Enc Vitals Group     BP 12/18/17 1345 (!) 118/54     Pulse Rate 12/18/17 1345 97     Resp 12/18/17 1345 18     Temp 12/18/17 1345 98.2 F (36.8 C)     Temp Source 12/18/17 1345 Oral     SpO2 12/18/17 1345 97 %     Weight 12/18/17 1347 114 lb (51.7 kg)     Height 12/18/17 1347 5\' 2"  (1.575 m)     Head Circumference --  Peak Flow --      Pain Score 12/18/17 1350 6     Pain Loc --      Pain Edu? --      Excl. in [place]? --    Constitutional: Alert and oriented. Well appearing and in no distress. Eyes: Conjunctivae are normal. Normal extraocular movements. ENT   Head: Normocephalic and atraumatic.   Nose: No congestion/rhinnorhea.   Mouth/Throat: Mucous membranes are moist.   Neck: No stridor. Cardiovascular: Normal rate, regular rhythm. No murmurs, rubs, or  gallops. Respiratory: Normal respiratory effort without tachypnea nor retractions. Breath sounds are clear and equal bilaterally. No wheezes/rales/rhonchi. Gastrointestinal: Mild bilateral flank tenderness, no rebound or guarding.  Normal bowel sounds. Musculoskeletal: Nontender with normal range of motion in extremities. No lower extremity tenderness nor edema. Neurologic:  Normal speech and language. No gross focal neurologic deficits are appreciated.  Skin:  Skin is warm, dry and intact. No rash noted. Psychiatric: Mood and affect are normal. Speech and behavior are normal.  ____________________________________________  ED COURSE:  As part of my medical decision making, I reviewed the following data within the [place] History obtained from family if available, nursing notes, old chart and ekg, as well as notes from prior ED visits. Patient presented for flank pain, we will assess with labs and imaging as indicated at this time.   Procedures ____________________________________________   LABS (pertinent positives/negatives)  Labs Reviewed  BASIC METABOLIC PANEL - Abnormal; Notable for the following components:      Result Value   Glucose, Bld 107 (*)    Creatinine, Ser 1.02 (*)    GFR calc non Af Amer 49 (*)    GFR calc Af Amer 57 (*)    All other components within normal limits  URINE CULTURE  CBC  URINALYSIS, COMPLETE (UACMP) WITH MICROSCOPIC    RADIOLOGY  CT renal protocol IMPRESSION: 1. No findings to account for the patient's history of bilateral flank pain. Specifically, no nephrolithiasis or findings to suggest urinary tract obstruction at this time. 2. Aortic atherosclerosis, in addition to at least right coronary artery disease. 3. Colonic diverticulosis without evidence of acute diverticulitis at this time. 4. Additional incidental findings, as above.  Aortic Atherosclerosis  (ICD10-I70.0). ____________________________________________  DIFFERENTIAL DIAGNOSIS   Muscle strain, constipation, UTI, pyelonephritis, renal colic, dissection, AAA  FINAL ASSESSMENT AND PLAN  Flank pain   Plan: Patient had presented for flank pain that started early this morning. Patient's labs are reassuring. Patient's imaging were negative for any acute process.  Pain is likely musculoskeletal in origin.  She will be discharged with pain medicine and is stable for outpatient follow-up.   Earleen Newport, MD   Note: This note was generated in part or whole with voice recognition software. Voice recognition is usually quite accurate but there are transcription errors that can and very often do occur. I apologize for any typographical errors that were not detected and corrected.     Earleen Newport, MD 12/18/17 [phone]

## 2017-12-18 NOTE — ED Notes (Signed)
Pt returned from CT at this time.  

## 2017-12-18 NOTE — ED Triage Notes (Addendum)
First Nurse Note:  Arrives via [facility] with C/O bilateral  flank pain.  Pain onset this morning at 1230.  History of kidney stones, last in 2012.  VS wnl.  Also c/o right knee pain -- patient states she injured the knee when stepping out of the ambulance.

## 2017-12-18 NOTE — ED Notes (Signed)
Pt taken to POV in wheelchair. Right knee wrapped up with ace wrap. Pt able to bear weight. VSS. NAD. Discharge instructions, RX and follow up reviewed all questions answered.

## 2017-12-18 NOTE — ED Triage Notes (Signed)
Pt arrived via EMS from home with husband. Pt states she started having bilateral flank pain that started during the night, pt states she has a hx of kidney stones and pain feels the same.  Pt states she has taken meloxicam x 2 today with no relief.   Pt also c/o pain from the waist down.

## 2017-12-18 NOTE — ED Notes (Signed)
Patient transported to CT 

## 2017-12-18 NOTE — ED Notes (Signed)
Wrapped knee with ace wrap. Able to bear weight and ambulate without difficulty.

## 2017-12-20 LAB — URINE CULTURE: Special Requests: NORMAL

## 2017-12-22 DIAGNOSIS — H1089 Other conjunctivitis: Secondary | ICD-10-CM | POA: Diagnosis not present

## 2017-12-22 DIAGNOSIS — H43813 Vitreous degeneration, bilateral: Secondary | ICD-10-CM | POA: Diagnosis not present

## 2017-12-22 DIAGNOSIS — H25813 Combined forms of age-related cataract, bilateral: Secondary | ICD-10-CM | POA: Diagnosis not present

## 2018-01-22 ENCOUNTER — Other Ambulatory Visit: Payer: Self-pay | Admitting: Internal Medicine

## 2018-02-10 DIAGNOSIS — C44319 Basal cell carcinoma of skin of other parts of face: Secondary | ICD-10-CM | POA: Diagnosis not present

## 2018-02-16 ENCOUNTER — Other Ambulatory Visit: Payer: Self-pay | Admitting: Internal Medicine

## 2018-02-16 NOTE — Telephone Encounter (Signed)
Do not see this in the pt's current or historical med list.  Last OV: 08/10/2017 Next OV: 03/24/2018

## 2018-03-10 ENCOUNTER — Ambulatory Visit: Payer: Medicare Other | Admitting: Internal Medicine

## 2018-03-18 ENCOUNTER — Telehealth: Payer: Self-pay | Admitting: Radiology

## 2018-03-18 DIAGNOSIS — Z79899 Other long term (current) drug therapy: Secondary | ICD-10-CM

## 2018-03-18 DIAGNOSIS — E782 Mixed hyperlipidemia: Secondary | ICD-10-CM

## 2018-03-18 DIAGNOSIS — E039 Hypothyroidism, unspecified: Secondary | ICD-10-CM

## 2018-03-18 NOTE — Telephone Encounter (Signed)
Pt coming in tomorrow for labs, please place future orders. Thank you.  

## 2018-03-18 NOTE — Addendum Note (Signed)
Addended by: Crecencio Mc on: 03/18/2018 04:53 PM   Modules accepted: Orders

## 2018-03-19 ENCOUNTER — Other Ambulatory Visit (INDEPENDENT_AMBULATORY_CARE_PROVIDER_SITE_OTHER): Payer: Medicare Other

## 2018-03-19 DIAGNOSIS — E039 Hypothyroidism, unspecified: Secondary | ICD-10-CM

## 2018-03-19 DIAGNOSIS — E782 Mixed hyperlipidemia: Secondary | ICD-10-CM

## 2018-03-19 DIAGNOSIS — Z79899 Other long term (current) drug therapy: Secondary | ICD-10-CM

## 2018-03-19 LAB — LIPID PANEL
Cholesterol: 142 mg/dL (ref 0–200)
HDL: 38.2 mg/dL — ABNORMAL LOW (ref >39.00)
LDL Cholesterol: 73 mg/dL (ref 0–99)
NonHDL: 103.4
Total CHOL/HDL Ratio: 4
Triglycerides: 150 mg/dL — ABNORMAL HIGH (ref 0.0–149.0)
VLDL: 30 mg/dL (ref 0.0–40.0)

## 2018-03-19 LAB — COMPREHENSIVE METABOLIC PANEL
ALT: 14 U/L (ref 0–35)
AST: 18 U/L (ref 0–37)
Albumin: 3.9 g/dL (ref 3.5–5.2)
Alkaline Phosphatase: 59 U/L (ref 39–117)
BUN: 16 mg/dL (ref 6–23)
CO2: 28 mEq/L (ref 19–32)
Calcium: 9.4 mg/dL (ref 8.4–10.5)
Chloride: 105 mEq/L (ref 96–112)
Creatinine, Ser: 1.13 mg/dL (ref 0.40–1.20)
GFR: 48.66 mL/min — ABNORMAL LOW (ref >60.00)
Glucose, Bld: 81 mg/dL (ref 70–99)
Potassium: 4.4 mEq/L (ref 3.5–5.1)
Sodium: 142 mEq/L (ref 135–145)
Total Bilirubin: 0.4 mg/dL (ref 0.2–1.2)
Total Protein: 6.9 g/dL (ref 6.0–8.3)

## 2018-03-19 LAB — TSH: TSH: 3.62 u[IU]/mL (ref 0.35–4.50)

## 2018-03-24 ENCOUNTER — Ambulatory Visit (INDEPENDENT_AMBULATORY_CARE_PROVIDER_SITE_OTHER): Payer: Medicare Other | Admitting: Internal Medicine

## 2018-03-24 ENCOUNTER — Encounter: Payer: Self-pay | Admitting: Internal Medicine

## 2018-03-24 VITALS — BP 126/60 | HR 88 | Temp 98.3°F | Resp 14 | Ht 62.0 in | Wt 119.2 lb

## 2018-03-24 DIAGNOSIS — K635 Polyp of colon: Secondary | ICD-10-CM | POA: Diagnosis not present

## 2018-03-24 DIAGNOSIS — A0471 Enterocolitis due to Clostridium difficile, recurrent: Secondary | ICD-10-CM

## 2018-03-24 DIAGNOSIS — E785 Hyperlipidemia, unspecified: Secondary | ICD-10-CM | POA: Diagnosis not present

## 2018-03-24 DIAGNOSIS — F413 Other mixed anxiety disorders: Secondary | ICD-10-CM

## 2018-03-24 DIAGNOSIS — E034 Atrophy of thyroid (acquired): Secondary | ICD-10-CM

## 2018-03-24 DIAGNOSIS — Z1239 Encounter for other screening for malignant neoplasm of breast: Secondary | ICD-10-CM

## 2018-03-24 MED ORDER — PANTOPRAZOLE SODIUM 40 MG PO TBEC: 40.0000 mg | DELAYED_RELEASE_TABLET | Freq: Every day | ORAL | 1 refills | Status: DC

## 2018-03-24 MED ORDER — ZOSTER VAC RECOMB ADJUVANTED 50 MCG/0.5ML IM SUSR: 0.5000 mL | Freq: Once | INTRAMUSCULAR | 1 refills | Status: AC

## 2018-03-24 MED ORDER — ESCITALOPRAM OXALATE 10 MG PO TABS: 10.0000 mg | ORAL_TABLET | Freq: Every day | ORAL | 2 refills | Status: DC

## 2018-03-24 NOTE — Progress Notes (Signed)
Patient ID: Emily Wu, female    DOB: Jan 07, 1931  Age: 84 y.o. MRN: [id number]  The patient is here for  management and follow up of  chronic and acute p oblems.   Last DEXA 2016 Last mammogram 2016: Labs done April 26 : normal  Colonoscopy 2018 for h/o polyps:  Diver ticulosis,  Hemorrhoid banded     The risk factors are reflected in the social history.  The roster of all physicians providing medical care to patient - is listed in the Snapshot section of the chart.  Activities of daily living:  The patient is 100% independent in all ADLs: dressing, toileting, feeding as well as independent mobility  Home safety : The patient has smoke detectors in the home. They wear seatbelts.  There are no firearms at home. There is no violence in the home.   There is no risks for hepatitis, STDs or HIV. There is no   history of blood transfusion. They have no travel history to infectious disease endemic areas of the world.  The patient has seen their dentist in the last six month. They have seen their eye doctor in the last year. They admit to slight hearing difficulty with regard to whispered voices and some television programs.  They have deferred audiologic testing in the last year.  They do not  have excessive sun exposure. Discussed the need for sun protection: hats, long sleeves and use of sunscreen if there is significant sun exposure.   Diet: the importance of a healthy diet is discussed. They do have a healthy diet.  The benefits of regular aerobic exercise were discussed. She walks 4 times per week ,  20 minutes.   Depression screen: there are no signs or vegative symptoms of depression- irritability, change in appetite, anhedonia, sadness/tearfullness.  Cognitive assessment: the patient manages all their financial and personal affairs and is actively engaged. They could relate day,date,year and events; recalled 2/3 objects at 3 minutes; performed clock-face test normally.  The  following portions of the patient's history were reviewed and updated as appropriate: allergies, current medications, past family history, past medical history,  past surgical history, past social history  and problem list.  Visual acuity was not assessed per patient preference since she has regular follow up with her ophthalmologist. Hearing and body mass index were assessed and reviewed.   During the course of the visit the patient was educated and counseled about appropriate screening and preventive services including : fall prevention , diabetes screening, nutrition counseling, colorectal cancer screening, and recommended immunizations.    CC: The primary encounter diagnosis was Breast cancer screening. Diagnoses of Recurrent colitis due to Clostridium difficile, Hyperlipidemia, unspecified hyperlipidemia type, Hyperplastic colonic polyp, unspecified part of colon, Hypothyroidism due to acquired atrophy of thyroid, and Other mixed anxiety disorders were also pertinent to this visit.   Had a prolonged GI illness after the holidays,  Had diarrhea 6 to 7 watery stools daily, which resolved fer 6 or 7 days without antibiotics .  Stools are now formed.  Worried about her husband who apparently has no primary care physician, yndiagnosed  dementia, unintentional weight loss.    Sent him to ER today for persistent severe neck pain. Previously seen by Emily Wu.  Has lost weight, 13 lbs since Christmas .  Has gained  4 back. Sleeping well at night.  Wants a memd for nerves    usng voltaren gel on knees and once daily meloxicam as needed. Not taking  PPI  Seeing [facility] clinic at [facility] for knee pain. Has had nerve blocks Has low back pain and kyphosis .    Follow up on hyperlipidemia and atherosclerosis , hypothyroidism  History Emily Wu has a past medical history of Arthritis, Bruises easily, Cervical cancer ([place]) (1965), High cholesterol, Hypertension, and Tendency toward bleeding easily ([place]).   She  has a past surgical history that includes Knee arthroscopy (Left); Shoulder open rotator cuff repair (Bilateral, 2007,  2010); Abdominal hysterectomy (1965); Appendectomy; Cholecystectomy; and Throat surgery (2014).   Her family history includes Heart disease (age of onset: 58) in her father; Hyperlipidemia in her father; Peripheral vascular disease in her mother; Stroke in her father.She reports that she has quit smoking. Her smoking use included cigarettes. She has a 14.00 pack-year smoking history. She has never used smokeless tobacco. She reports that she does not drink alcohol or use drugs.  Outpatient Medications Prior to Visit  Medication Sig Dispense Refill  . acetaminophen (TYLENOL) 650 MG CR tablet Take by mouth.    Marland Kitchen amitriptyline (ELAVIL) 25 MG tablet TAKE ONE TABLET EVERY DAY 90 tablet 1  . atorvastatin (LIPITOR) 20 MG tablet TAKE ONE TABLET EVERY DAY 90 tablet 3  . diclofenac sodium (VOLTAREN) 1 % GEL APPLY 2 GRAMS TOPICALLY FOUR TIMES DAILY 100 g 2  . estradiol (VIVELLE-DOT) 0.1 MG/24HR patch Place 1 patch (0.1 mg total) onto the skin 2 (two) times a week. 8 patch 12  . liothyronine (CYTOMEL) 5 MCG tablet TAKE 2 TABLETS BY MOUTH DAILY 180 tablet 1  . meloxicam (MOBIC) 15 MG tablet TAKE ONE TABLET BY MOUTH EVERY DAY. **NEED APPT WITH MD IN FEBRUARY** 90 tablet 0  . Multiple Vitamin (MULTIVITAMIN) tablet Take 1 tablet by mouth daily.    . Probiotic Product (VSL#3) CAPS Take 2 capsules by mouth 2 (two) times daily.    Marland Kitchen docusate sodium (COLACE) 100 MG capsule Take 1 capsule (100 mg total) by mouth daily as needed. (Patient not taking: Reported on 03/24/2018) 30 capsule 2  . amitriptyline (ELAVIL) 25 MG tablet TAKE ONE TABLET EVERY DAY (Patient not taking: Reported on 03/24/2018) 90 tablet 1  . oxyCODONE-acetaminophen (PERCOCET) 5-325 MG tablet Take 1 tablet by mouth every 6 (six) hours as needed. (Patient not taking: Reported on 03/24/2018) 12 tablet 0  . pantoprazole (PROTONIX) 40 MG tablet  Take 1 tablet (40 mg total) by mouth daily. (Patient not taking: Reported on 03/24/2018) 90 tablet 1   No facility-administered medications prior to visit.     Review of Systems   Patient denies headache, fevers, malaise, unintentional weight loss, skin rash, eye pain, sinus congestion and sinus pain, sore throat, dysphagia,  hemoptysis , cough, dyspnea, wheezing, chest pain, palpitations, orthopnea, edema, abdominal pain, nausea, melena, diarrhea, constipation, flank pain, dysuria, hematuria, urinary  Frequency, nocturia, numbness, tingling, seizures,  Focal weakness, Loss of consciousness,  Tremor, insomnia, depression, anxiety, and suicidal ideation.      Objective:  BP 126/60 (BP Location: Left Arm, Patient Position: Sitting, Cuff Size: Normal)   Pulse 88   Temp 98.3 F (36.8 C) (Oral)   Resp 14   Ht 5\' 2"  (1.575 m)   Wt 119 lb 3.2 oz (54.1 kg)   SpO2 99%   BMI 21.80 kg/m   Physical Exam   General appearance: alert, cooperative and appears stated age Head: Normocephalic, without obvious abnormality, atraumatic Eyes: conjunctivae/corneas clear. PERRL, EOM's intact. Fundi benign. Ears: normal TM's and external ear canals both ears Nose: Nares normal. Septum midline.  Mucosa normal. No drainage or sinus tenderness. Throat: lips, mucosa, and tongue normal; teeth and gums normal Neck: no adenopathy, no carotid bruit, no JVD, supple, symmetrical, trachea midline and thyroid not enlarged, symmetric, no tenderness/mass/nodules Lungs: clear to auscultation bilaterally Breasts: bilateral saline implants, normal appearance, no masses or tenderness Heart: regular rate and rhythm, S1, S2 normal, no murmur, click, rub or gallop Abdomen: soft, non-tender; bowel sounds normal; no masses,  no organomegaly Extremities: extremities normal, atraumatic, no cyanosis or edema Pulses: 2+ and symmetric Skin: Skin color, texture, turgor normal. No rashes or lesions Neurologic: Alert and oriented X 3,  normal strength and tone. Normal symmetric reflexes. Normal coordination and gait.      Assessment & Plan:   Problem List Items Addressed This Visit      History of   Recurrent colitis due to Clostridium difficile    Her most recent episode of diarrhea resolved without diagnosis or treatment of c dif. Continue probiotic.         Other   Hypothyroidism    Thyroid function is WNL on current dose.  No current changes needed.   Lab Results  Component Value Date   TSH 3.62 03/19/2018         Hyperlipidemia    Well controlled on current statin therapy Liver enzymes were normal,  no changes today.  Lab Results  Component Value Date   CHOL 142 03/19/2018   HDL 38.20 (L) 03/19/2018   LDLCALC 73 03/19/2018   LDLDIRECT 87.0 04/15/2017   TRIG 150.0 (H) 03/19/2018   CHOLHDL 4 03/19/2018     Lab Results  Component Value Date   ALT 14 03/19/2018   AST 18 03/19/2018   ALKPHOS 59 03/19/2018   BILITOT 0.4 03/19/2018  \            Colon polyps    Last colonoscopy reviewed , done in 2018 , due to adenomatous polyps. No follow up recommended      Breast cancer screening - Primary    Breast exam done and mammogram ordered      Relevant Orders   MM DIGITAL SCREENING BILATERAL   Anxiety disorder    Adding lexapro for management of anxiety aggravated by husband's health       Relevant Medications   escitalopram (LEXAPRO) 10 MG tablet      I have discontinued Jerene Pitch. Parfitt's oxyCODONE-acetaminophen. I am also having her start on escitalopram and Zoster Vaccine Adjuvanted. Additionally, I am having her maintain her multivitamin, acetaminophen, VSL#3, atorvastatin, amitriptyline, estradiol, meloxicam, docusate sodium, liothyronine, diclofenac sodium, and pantoprazole.  Meds ordered this encounter  Medications  . pantoprazole (PROTONIX) 40 MG tablet    Sig: Take 1 tablet (40 mg total) by mouth daily.    Dispense:  90 tablet    Refill:  1  . escitalopram  (LEXAPRO) 10 MG tablet    Sig: Take 1 tablet (10 mg total) by mouth daily.    Dispense:  30 tablet    Refill:  2  . Zoster Vaccine Adjuvanted [facility]) injection    Sig: Inject 0.5 mLs into the muscle once for 1 dose.    Dispense:  1 each    Refill:  1  A total of 40 minutes was spent with patient more than half of which was spent in counseling patient on the above mentioned issues , reviewing and explaining recent labs and imaging studies done, and coordination of care.  Medications Discontinued During This Encounter  Medication Reason  .  amitriptyline (ELAVIL) 25 MG tablet Duplicate  . oxyCODONE-acetaminophen (PERCOCET) 5-325 MG tablet Patient has not taken in last 30 days  . pantoprazole (PROTONIX) 40 MG tablet Patient has not taken in last 30 days    Follow-up: Return in about 6 months (around 09/24/2018).   Crecencio Mc, MD

## 2018-03-24 NOTE — Patient Instructions (Addendum)
I will accept your husband as a new patient    Please resume daily use of pantoprazole to protect your stomach from the diclofenac and meloxicam you are using for your painful joints  I am prescribing lexapro for your anxiety.   Please start the Lexapro (escitalopram) at 1/2 tablet daily in the evening after dinner for the first few days to avoid nausea.  You can increase to a full tablet after 6 days if you have not developed side effects of nausea.  If the lexapro interferes with your sleep, take it in the morning instead  Please return in  4 weeks ,  Or e mail me to let me know how it is helping your anxiety     Mammogram will be ordered today. You can have [facility] done at [facility] per usual     The ShingRx vaccine is now available in local pharmacies and is much more protective thant Zostavaxs,  It is therefore ADVISED for all interested adults over 50 to prevent shingles

## 2018-03-25 DIAGNOSIS — L708 Other acne: Secondary | ICD-10-CM | POA: Diagnosis not present

## 2018-03-25 DIAGNOSIS — Z08 Encounter for follow-up examination after completed treatment for malignant neoplasm: Secondary | ICD-10-CM | POA: Diagnosis not present

## 2018-03-25 DIAGNOSIS — F419 Anxiety disorder, unspecified: Secondary | ICD-10-CM | POA: Insufficient documentation

## 2018-03-25 DIAGNOSIS — Z85828 Personal history of other malignant neoplasm of skin: Secondary | ICD-10-CM | POA: Diagnosis not present

## 2018-03-25 NOTE — Assessment & Plan Note (Signed)
Breast exam done and mammogram ordered  

## 2018-03-25 NOTE — Assessment & Plan Note (Signed)
Last colonoscopy reviewed , done in 2018 , due to adenomatous polyps. No follow up recommended

## 2018-03-25 NOTE — Assessment & Plan Note (Signed)
Her most recent episode of diarrhea resolved without diagnosis or treatment of c dif. Continue probiotic.

## 2018-03-25 NOTE — Assessment & Plan Note (Signed)
Adding lexapro for management of anxiety aggravated by husband's health

## 2018-03-25 NOTE — Assessment & Plan Note (Addendum)
Well controlled on current statin therapy Liver enzymes were normal,  no changes today.  Lab Results  Component Value Date   CHOL 142 03/19/2018   HDL 38.20 (L) 03/19/2018   LDLCALC 73 03/19/2018   LDLDIRECT 87.0 04/15/2017   TRIG 150.0 (H) 03/19/2018   CHOLHDL 4 03/19/2018     Lab Results  Component Value Date   ALT 14 03/19/2018   AST 18 03/19/2018   ALKPHOS 59 03/19/2018   BILITOT 0.4 03/19/2018  \

## 2018-03-25 NOTE — Assessment & Plan Note (Signed)
Thyroid function is WNL on current dose.  No current changes needed.   Lab Results  Component Value Date   TSH 3.62 03/19/2018

## 2018-04-02 ENCOUNTER — Other Ambulatory Visit: Payer: Self-pay | Admitting: Internal Medicine

## 2018-04-02 NOTE — Telephone Encounter (Signed)
Last OV 5/19 ok to fill?

## 2018-04-02 NOTE — Telephone Encounter (Signed)
Based on her age maximal dose is 7.5 mg daily

## 2018-04-30 ENCOUNTER — Other Ambulatory Visit: Payer: Self-pay | Admitting: Internal Medicine

## 2018-06-15 ENCOUNTER — Other Ambulatory Visit: Payer: Self-pay | Admitting: Internal Medicine

## 2018-06-22 DIAGNOSIS — S81812A Laceration without foreign body, left lower leg, initial encounter: Secondary | ICD-10-CM | POA: Diagnosis not present

## 2018-07-02 DIAGNOSIS — C44319 Basal cell carcinoma of skin of other parts of face: Secondary | ICD-10-CM | POA: Diagnosis not present

## 2018-07-07 ENCOUNTER — Other Ambulatory Visit: Payer: Self-pay | Admitting: Internal Medicine

## 2018-07-24 ENCOUNTER — Other Ambulatory Visit: Payer: Self-pay | Admitting: Internal Medicine

## 2018-08-02 ENCOUNTER — Other Ambulatory Visit: Payer: Self-pay | Admitting: Internal Medicine

## 2018-08-21 ENCOUNTER — Other Ambulatory Visit: Payer: Self-pay | Admitting: Internal Medicine

## 2018-08-24 ENCOUNTER — Other Ambulatory Visit: Payer: Self-pay | Admitting: Internal Medicine

## 2018-09-13 ENCOUNTER — Ambulatory Visit: Payer: Medicare Other | Admitting: Internal Medicine

## 2018-09-28 ENCOUNTER — Encounter: Payer: Self-pay | Admitting: Internal Medicine

## 2018-09-28 ENCOUNTER — Ambulatory Visit (INDEPENDENT_AMBULATORY_CARE_PROVIDER_SITE_OTHER): Payer: Medicare Other | Admitting: Internal Medicine

## 2018-09-28 VITALS — BP 132/60 | HR 86 | Temp 98.0°F | Resp 15 | Ht 62.0 in | Wt 122.0 lb

## 2018-09-28 DIAGNOSIS — K2100 Gastro-esophageal reflux disease with esophagitis, without bleeding: Secondary | ICD-10-CM

## 2018-09-28 DIAGNOSIS — E7800 Pure hypercholesterolemia, unspecified: Secondary | ICD-10-CM

## 2018-09-28 DIAGNOSIS — F413 Other mixed anxiety disorders: Secondary | ICD-10-CM | POA: Diagnosis not present

## 2018-09-28 DIAGNOSIS — E034 Atrophy of thyroid (acquired): Secondary | ICD-10-CM

## 2018-09-28 LAB — COMPREHENSIVE METABOLIC PANEL
ALT: 16 U/L (ref 0–35)
AST: 21 U/L (ref 0–37)
Albumin: 3.9 g/dL (ref 3.5–5.2)
Alkaline Phosphatase: 55 U/L (ref 39–117)
BUN: 16 mg/dL (ref 6–23)
CO2: 30 mEq/L (ref 19–32)
Calcium: 9.4 mg/dL (ref 8.4–10.5)
Chloride: 104 mEq/L (ref 96–112)
Creatinine, Ser: 1.16 mg/dL (ref 0.40–1.20)
GFR: 47.15 mL/min — ABNORMAL LOW (ref >60.00)
Glucose, Bld: 93 mg/dL (ref 70–99)
Potassium: 4.1 mEq/L (ref 3.5–5.1)
Sodium: 140 mEq/L (ref 135–145)
Total Bilirubin: 0.3 mg/dL (ref 0.2–1.2)
Total Protein: 6.6 g/dL (ref 6.0–8.3)

## 2018-09-28 LAB — LIPID PANEL
Cholesterol: 143 mg/dL (ref 0–200)
HDL: 30.3 mg/dL — ABNORMAL LOW (ref >39.00)
NonHDL: 112.4
Total CHOL/HDL Ratio: 5
Triglycerides: 268 mg/dL — ABNORMAL HIGH (ref 0.0–149.0)
VLDL: 53.6 mg/dL — ABNORMAL HIGH (ref 0.0–40.0)

## 2018-09-28 LAB — LDL CHOLESTEROL, DIRECT: Direct LDL: 72 mg/dL

## 2018-09-28 LAB — TSH: TSH: 2.77 u[IU]/mL (ref 0.35–4.50)

## 2018-09-28 NOTE — Progress Notes (Signed)
Subjective:  Patient ID: Emily Wu, female    DOB: 03-12-1931  Age: 84 y.o. MRN: [id number]  CC: The primary encounter diagnosis was Pure hypercholesterolemia. Diagnoses of Hypothyroidism due to acquired atrophy of thyroid, Other mixed anxiety disorders, and Gastroesophageal reflux disease with esophagitis were also pertinent to this visit.  HPI Emily Wu presents for follow up on chronic conditions .   Anxiety:  AGGRAVATED BY husband's Jack's decline in health due to dementia .  A trial of  lexapro started at last visit in may  Trial of 1 pill ,  Made her feel lethargic so she Stopped taking it . Still very anxious due to husband getting worse.  Does not have any caregivers "it's not that bad yet." still goes out to eat 2 meals per day . Becomes beserk over money issues,  Still handling his business's checking account  Starting to have trouble .   Not physically abusive,  But emotionally adversaria.  Not paranoid l Daughter Gaetano Net  Aware. Tried one dose of lexapro but did not tolerate it because she started taking it at the full dose .  GERD:  She has stopped  using protonix,  Treated gerd with pepcid,  Not helping   Outpatient Medications Prior to Visit  Medication Sig Dispense Refill  . acetaminophen (TYLENOL) 650 MG CR tablet Take by mouth.    Marland Kitchen amitriptyline (ELAVIL) 25 MG tablet TAKE ONE TABLET EVERY DAY 90 tablet 1  . atorvastatin (LIPITOR) 20 MG tablet TAKE 1 TABLET BY MOUTH DAILY 90 tablet 1  . diclofenac sodium (VOLTAREN) 1 % GEL APPLY 2 GRAMS TOPICALLY FOUR TIMES DAILY 100 g 2  . estradiol (VIVELLE-DOT) 0.1 MG/24HR patch PLACE 1 PATCH ONTO SKIN 2 TIMES A WEEK 8 patch 2  . liothyronine (CYTOMEL) 5 MCG tablet TAKE TWO TABLETS EVERY DAY 180 tablet 0  . meloxicam (MOBIC) 7.5 MG tablet TAKE 1 TABLET BY MOUTH DAILY AS NEEDED FOR JOINT PAIN 90 tablet 1  . Multiple Vitamin (MULTIVITAMIN) tablet Take 1 tablet by mouth daily.    . Probiotic Product (VSL#3) CAPS  Take 2 capsules by mouth 2 (two) times daily.    Marland Kitchen escitalopram (LEXAPRO) 10 MG tablet Take 1 tablet (10 mg total) by mouth daily. (Patient not taking: Reported on 09/28/2018) 30 tablet 2  . pantoprazole (PROTONIX) 40 MG tablet Take 1 tablet (40 mg total) by mouth daily. (Patient not taking: Reported on 09/28/2018) 90 tablet 1  . amitriptyline (ELAVIL) 25 MG tablet TAKE ONE TABLET EVERY DAY (Patient not taking: Reported on 09/28/2018) 90 tablet 1  . docusate sodium (COLACE) 100 MG capsule Take 1 capsule (100 mg total) by mouth daily as needed. (Patient not taking: Reported on 03/24/2018) 30 capsule 2   No facility-administered medications prior to visit.     Review of Systems;  Patient denies headache, fevers, malaise, unintentional weight loss, skin rash, eye pain, sinus congestion and sinus pain, sore throat, dysphagia,  hemoptysis , cough, dyspnea, wheezing, chest pain, palpitations, orthopnea, edema, abdominal pain, nausea, melena, diarrhea, constipation, flank pain, dysuria, hematuria, urinary  Frequency, nocturia, numbness, tingling, seizures,  Focal weakness, Loss of consciousness,  Tremor,  depression,  and suicidal ideation.      Objective:  BP 132/60 (BP Location: Left Arm, Patient Position: Sitting, Cuff Size: Normal)   Pulse 86   Temp 98 F (36.7 C) (Oral)   Resp 15   Ht 5\' 2"  (1.575 m)   Wt 122 lb (55.3 kg)  SpO2 98%   BMI 22.31 kg/m   BP Readings from Last 3 Encounters:  09/28/18 132/60  03/24/18 126/60  12/18/17 122/72    Wt Readings from Last 3 Encounters:  09/28/18 122 lb (55.3 kg)  03/24/18 119 lb 3.2 oz (54.1 kg)  12/18/17 114 lb (51.7 kg)    General appearance: alert, anxious and appears stated age Ears: normal TM's and external ear canals both ears Throat: lips, mucosa, and tongue normal; teeth and gums normal Neck: no adenopathy, no carotid bruit, supple, symmetrical, trachea midline and thyroid not enlarged, symmetric, no tenderness/mass/nodules Back:  symmetric, no curvature. ROM normal. No CVA tenderness. Lungs: clear to auscultation bilaterally Heart: regular rate and rhythm, S1, S2 normal, no murmur, click, rub or gallop Abdomen: soft, non-tender; bowel sounds normal; no masses,  no organomegaly Pulses: 2+ and symmetric Skin: Skin color, texture, turgor normal. No rashes or lesions Lymph nodes: Cervical, supraclavicular, and axillary nodes normal. Psych: affect anxious , makes good eye contact. constantly fidgeting,, Denies suicidal thoughts   No results found for: HGBA1C  Lab Results  Component Value Date   CREATININE 1.16 09/28/2018   CREATININE 1.13 03/19/2018   CREATININE 1.02 (H) 12/18/2017    Lab Results  Component Value Date   WBC 9.7 12/18/2017   HGB 13.6 12/18/2017   HCT 40.1 12/18/2017   PLT 245 12/18/2017   GLUCOSE 93 09/28/2018   CHOL 143 09/28/2018   TRIG 268.0 (H) 09/28/2018   HDL 30.30 (L) 09/28/2018   LDLDIRECT 72.0 09/28/2018   LDLCALC 73 03/19/2018   ALT 16 09/28/2018   AST 21 09/28/2018   NA 140 09/28/2018   K 4.1 09/28/2018   CL 104 09/28/2018   CREATININE 1.16 09/28/2018   BUN 16 09/28/2018   CO2 30 09/28/2018   TSH 2.77 09/28/2018    Dg Knee Complete 4 Views Right  Result Date: 12/18/2017 CLINICAL DATA:  Right knee buckling with generalized pain. EXAM: RIGHT KNEE - COMPLETE 4+ VIEW COMPARISON:  None. FINDINGS: No acute fracture or joint dislocations. No joint effusion. Tricompartmental osteoarthritic joint space narrowing and spurring is noted. No soft tissue mass or mineralization. IMPRESSION: Tricompartmental osteoarthritis without acute osseous abnormality. Electronically Signed   By: Schelling Royalty M.D.   On: 12/18/2017 16:22   Ct Renal Stone Study  Result Date: 12/18/2017 CLINICAL DATA:  84 year old female with bilateral flank pain starting yesterday evening. Prior history of nephrolithiasis. EXAM: CT ABDOMEN AND PELVIS WITHOUT CONTRAST TECHNIQUE: Multidetector CT imaging of the abdomen and  pelvis was performed following the standard protocol without IV contrast. COMPARISON:  CT the abdomen and pelvis 01/29/2011. FINDINGS: Lower chest: Bilateral breast implants incidentally noted. Atherosclerotic calcifications in the right coronary artery. Hepatobiliary: No definite cystic or solid hepatic lesions are identified in the liver on today's noncontrast CT examination. Status post cholecystectomy. Pancreas: No definite pancreatic mass or peripancreatic inflammatory changes are noted on today's noncontrast CT examination. Spleen: Unremarkable. Adrenals/Urinary Tract: No definite calcifications are identified within the collecting system of either kidney, along the course of either ureter or within the lumen of the urinary bladder. No hydroureteronephrosis or perinephric stranding to suggest urinary tract obstruction at this time. 11 mm low-attenuation lesion in the lower pole the left kidney, incompletely characterized on today's noncontrast CT examination, but favored to represent a cyst. Urinary bladder is normal in appearance. Bilateral adrenal glands are normal in appearance. Stomach/Bowel: Normal appearance of the stomach. No pathologic dilatation of small bowel or colon. Numerous colonic diverticulae are noted,  without surrounding inflammatory changes to suggest an acute diverticulitis at this time. The appendix is not confidently identified and may be surgically absent. Regardless, there are no inflammatory changes noted adjacent to the cecum to suggest the presence of an acute appendicitis at this time. Vascular/Lymphatic: Aortic atherosclerosis, without evidence of aneurysm in the abdominal or pelvic vasculature. No lymphadenopathy noted in the abdomen or pelvis. Reproductive: Status post hysterectomy. Ovaries are not confidently identified and may be surgically absent or atrophic. Other: No significant volume of ascites.  No pneumoperitoneum. Musculoskeletal: There are no aggressive appearing lytic  or blastic lesions noted in the visualized portions of the skeleton. IMPRESSION: 1. No findings to account for the patient's history of bilateral flank pain. Specifically, no nephrolithiasis or findings to suggest urinary tract obstruction at this time. 2. Aortic atherosclerosis, in addition to at least right coronary artery disease. 3. Colonic diverticulosis without evidence of acute diverticulitis at this time. 4. Additional incidental findings, as above. Aortic Atherosclerosis (ICD10-I70.0). Electronically Signed   By: Vinnie Langton M.D.   On: 12/18/2017 15:16    Assessment & Plan:   Problem List Items Addressed This Visit    Anxiety disorder    Aggravated by husband's cognitive decline.  Encouraged to attend the seminar sponsored by [employer] for family of patients with dementia.  And  to repeat the trial of lexapro starting with 1/2 tablet.        GERD (gastroesophageal reflux disease)    Advised to resume protonix given failure of H2 blockers to treatment symptoms adequately       Hyperlipidemia - Primary   Relevant Orders   Comprehensive metabolic panel (Completed)   Lipid panel (Completed)   Hypothyroidism   Relevant Orders   TSH (Completed)     A total of 25 minutes of face to face time was spent with patient more than half of which was spent in counselling about the above mentioned conditions  and coordination of care .  I have discontinued Jerene Pitch. Eisenhart's docusate sodium. I am also having her maintain her multivitamin, acetaminophen, VSL#3, amitriptyline, pantoprazole, escitalopram, liothyronine, diclofenac sodium, atorvastatin, estradiol, and meloxicam.  No orders of the defined types were placed in this encounter.   Medications Discontinued During This Encounter  Medication Reason  . amitriptyline (ELAVIL) 25 MG tablet Duplicate  . docusate sodium (COLACE) 100 MG capsule Error    Follow-up: No follow-ups on file.   Crecencio Mc, MD

## 2018-09-28 NOTE — Patient Instructions (Addendum)
I want to help you treat your stress safely   Please start the Lexapro (escitalopram) again at 1/2 tablet daily in the evening for the first week  to avoid side effects including fatigue and nausea.  You can increase to a full tablet after 4 days if you havenot developed side effects of nausea.  If the lexapro interferes with your sleep, take it in the morning instead  Please GIVE IT A FEW WEEKS TRIAL and  let me know how it is helping your anxiety  You would benefit from attending a seminar for family members of people with dementia.  Call [facility]; they are an assisted liviing facility for people with dementia and they sponsor a half day program every year

## 2018-09-29 DIAGNOSIS — M170 Bilateral primary osteoarthritis of knee: Secondary | ICD-10-CM | POA: Diagnosis not present

## 2018-09-29 DIAGNOSIS — K219 Gastro-esophageal reflux disease without esophagitis: Secondary | ICD-10-CM | POA: Insufficient documentation

## 2018-09-29 NOTE — Assessment & Plan Note (Signed)
Advised to resume protonix given failure of H2 blockers to treatment symptoms adequately

## 2018-09-29 NOTE — Assessment & Plan Note (Addendum)
Well controlled on current statin therapy by nonfasting labs today . Liver enzymes were normal,  no changes today.  Lab Results  Component Value Date   CHOL 143 09/28/2018   HDL 30.30 (L) 09/28/2018   LDLCALC 73 03/19/2018   LDLDIRECT 72.0 09/28/2018   TRIG 268.0 (H) 09/28/2018   CHOLHDL 5 09/28/2018     Lab Results  Component Value Date   ALT 16 09/28/2018   AST 21 09/28/2018   ALKPHOS 55 09/28/2018   BILITOT 0.3 09/28/2018  \

## 2018-09-29 NOTE — Assessment & Plan Note (Addendum)
Aggravated by husband's cognitive decline.  Encouraged to attend the seminar sponsored by [employer] for family of patients with dementia.  And  to repeat the trial of lexapro starting with 1/2 tablet.

## 2018-10-07 DIAGNOSIS — X32XXXA Exposure to sunlight, initial encounter: Secondary | ICD-10-CM | POA: Diagnosis not present

## 2018-10-07 DIAGNOSIS — Z85828 Personal history of other malignant neoplasm of skin: Secondary | ICD-10-CM | POA: Diagnosis not present

## 2018-10-07 DIAGNOSIS — L570 Actinic keratosis: Secondary | ICD-10-CM | POA: Diagnosis not present

## 2018-10-07 DIAGNOSIS — L821 Other seborrheic keratosis: Secondary | ICD-10-CM | POA: Diagnosis not present

## 2018-10-07 DIAGNOSIS — Z08 Encounter for follow-up examination after completed treatment for malignant neoplasm: Secondary | ICD-10-CM | POA: Diagnosis not present

## 2018-10-14 ENCOUNTER — Other Ambulatory Visit: Payer: Self-pay

## 2018-10-15 ENCOUNTER — Encounter

## 2018-10-15 ENCOUNTER — Ambulatory Visit: Payer: Medicare Other | Admitting: Internal Medicine

## 2018-11-02 ENCOUNTER — Other Ambulatory Visit: Payer: Self-pay | Admitting: Internal Medicine

## 2018-11-25 NOTE — Patient Instructions (Addendum)
Emily Wu  11/25/2018   Your procedure is scheduled on: 12-07-18    Report to [facility] Main  Entrance     Report to admitting at 7:30AM    Call this number if you have problems the morning of surgery [phone]     Remember: Do not eat food or drink liquids :After Midnight. BRUSH YOUR TEETH MORNING OF SURGERY AND RINSE YOUR MOUTH OUT, NO CHEWING GUM CANDY OR MINTS.     Take these medicines the morning of surgery with A SIP OF WATER: LIOTHYRONINE, CYTOMEL                                You may not have any metal on your body including hair pins and              piercings  Do not wear jewelry, make-up, lotions, powders or perfumes, deodorant             Do not wear nail polish.  Do not shave  48 hours prior to surgery.         Do not bring valuables to the hospital. [place].  Contacts, dentures or bridgework may not be worn into surgery.  Leave suitcase in the car. After surgery it may be brought to your room.                   Please read over the following fact sheets you were given: _____________________________________________________________________             [facility] - Preparing for Surgery Before surgery, you can play an important role.  Because skin is not sterile, your skin needs to be as free of germs as possible.  You can reduce the number of germs on your skin by washing with CHG (chlorahexidine gluconate) soap before surgery.  CHG is an antiseptic cleaner which kills germs and bonds with the skin to continue killing germs even after washing. Please DO NOT use if you have an allergy to CHG or antibacterial soaps.  If your skin becomes reddened/irritated stop using the CHG and inform your nurse when you arrive at Short Stay. Do not shave (including legs and underarms) for at least 48 hours prior to the first CHG shower.  You may shave your face/neck. Please follow these  instructions carefully:  1.  Shower with CHG Soap the night before surgery and the  morning of Surgery.  2.  If you choose to wash your hair, wash your hair first as usual with your  normal  shampoo.  3.  After you shampoo, rinse your hair and body thoroughly to remove the  shampoo.                           4.  Use CHG as you would any other liquid soap.  You can apply chg directly  to the skin and wash                       Gently with a scrungie or clean washcloth.  5.  Apply the CHG Soap to your body ONLY FROM THE NECK DOWN.   Do not use  on face/ open                           Wound or open sores. Avoid contact with eyes, ears mouth and genitals (private parts).                       Wash face,  Genitals (private parts) with your normal soap.             6.  Wash thoroughly, paying special attention to the area where your surgery  will be performed.  7.  Thoroughly rinse your body with warm water from the neck down.  8.  DO NOT shower/wash with your normal soap after using and rinsing off  the CHG Soap.                9.  Pat yourself dry with a clean towel.            10.  Wear clean pajamas.            11.  Place clean sheets on your bed the night of your first shower and do not  sleep with pets. Day of Surgery : Do not apply any lotions/deodorants the morning of surgery.  Please wear clean clothes to the hospital/surgery center.  FAILURE TO FOLLOW THESE INSTRUCTIONS MAY RESULT IN THE CANCELLATION OF YOUR SURGERY PATIENT SIGNATURE_________________________________  NURSE SIGNATURE__________________________________  ________________________________________________________________________

## 2018-11-26 ENCOUNTER — Other Ambulatory Visit: Payer: Self-pay | Admitting: Internal Medicine

## 2018-11-29 ENCOUNTER — Encounter (HOSPITAL_COMMUNITY)
Admission: RE | Admit: 2018-11-29 | Discharge: 2018-11-29 | Disposition: A | Discharge status: Home / Self Care | Payer: Medicare Other | Source: Ambulatory Visit | Attending: Orthopedic Surgery | Admitting: Orthopedic Surgery

## 2018-11-29 ENCOUNTER — Encounter (HOSPITAL_COMMUNITY): Payer: Self-pay

## 2018-11-29 ENCOUNTER — Encounter (HOSPITAL_COMMUNITY): Payer: Self-pay | Admitting: Physician Assistant

## 2018-11-29 ENCOUNTER — Other Ambulatory Visit: Payer: Self-pay | Admitting: Orthopedic Surgery

## 2018-11-29 ENCOUNTER — Other Ambulatory Visit: Payer: Self-pay

## 2018-11-29 ENCOUNTER — Telehealth: Payer: Self-pay | Admitting: Internal Medicine

## 2018-11-29 DIAGNOSIS — Z01812 Encounter for preprocedural laboratory examination: Secondary | ICD-10-CM | POA: Diagnosis not present

## 2018-11-29 LAB — CBC
HCT: 42.8 % (ref 36.0–46.0)
Hemoglobin: 13.4 g/dL (ref 12.0–15.0)
MCH: 30.3 pg (ref 26.0–34.0)
MCHC: 31.3 g/dL (ref 30.0–36.0)
MCV: 96.8 fL (ref 80.0–100.0)
Platelets: 260 10*3/uL (ref 150–400)
RBC: 4.42 MIL/uL (ref 3.87–5.11)
RDW: 13.2 % (ref 11.5–15.5)
WBC: 6.9 10*3/uL (ref 4.0–10.5)
nRBC: 0 % (ref 0.0–0.2)

## 2018-11-29 LAB — BASIC METABOLIC PANEL
Anion gap: 8 (ref 5–15)
BUN: 19 mg/dL (ref 8–23)
CO2: 27 mmol/L (ref 22–32)
Calcium: 9.3 mg/dL (ref 8.9–10.3)
Chloride: 104 mmol/L (ref 98–111)
Creatinine, Ser: 1 mg/dL (ref 0.44–1.00)
GFR calc Af Amer: 59 mL/min — ABNORMAL LOW (ref >60)
GFR calc non Af Amer: 51 mL/min — ABNORMAL LOW (ref >60)
Glucose, Bld: 96 mg/dL (ref 70–99)
Potassium: 4.4 mmol/L (ref 3.5–5.1)
Sodium: 139 mmol/L (ref 135–145)

## 2018-11-29 LAB — SURGICAL PCR SCREEN
MRSA, PCR: NEGATIVE
Staphylococcus aureus: NEGATIVE

## 2018-11-29 NOTE — Telephone Encounter (Signed)
Form received from [place], Left total knee replacement surgical clearance last OV 09/28/18, surgery scheduled for 12/07/18. Placed in quick sign.

## 2018-11-30 NOTE — Telephone Encounter (Signed)
I have not received it. 

## 2018-11-30 NOTE — Telephone Encounter (Signed)
Emily Wu has it said she was advised patient  Needs appointment first.

## 2018-12-02 ENCOUNTER — Ambulatory Visit: Payer: Medicare Other | Admitting: Family Medicine

## 2018-12-02 ENCOUNTER — Ambulatory Visit (INDEPENDENT_AMBULATORY_CARE_PROVIDER_SITE_OTHER): Payer: Medicare Other

## 2018-12-02 ENCOUNTER — Ambulatory Visit (INDEPENDENT_AMBULATORY_CARE_PROVIDER_SITE_OTHER): Payer: Medicare Other | Admitting: Internal Medicine

## 2018-12-02 VITALS — BP 128/70 | HR 73 | Temp 97.5°F | Resp 16 | Wt 123.4 lb

## 2018-12-02 DIAGNOSIS — R0989 Other specified symptoms and signs involving the circulatory and respiratory systems: Secondary | ICD-10-CM | POA: Diagnosis not present

## 2018-12-02 DIAGNOSIS — R0609 Other forms of dyspnea: Secondary | ICD-10-CM

## 2018-12-02 DIAGNOSIS — Z01811 Encounter for preprocedural respiratory examination: Secondary | ICD-10-CM

## 2018-12-02 DIAGNOSIS — Z01818 Encounter for other preprocedural examination: Secondary | ICD-10-CM

## 2018-12-02 DIAGNOSIS — R0600 Dyspnea, unspecified: Secondary | ICD-10-CM

## 2018-12-02 DIAGNOSIS — I2584 Coronary atherosclerosis due to calcified coronary lesion: Secondary | ICD-10-CM

## 2018-12-02 DIAGNOSIS — I447 Left bundle-branch block, unspecified: Secondary | ICD-10-CM

## 2018-12-02 DIAGNOSIS — Z01810 Encounter for preprocedural cardiovascular examination: Secondary | ICD-10-CM | POA: Diagnosis not present

## 2018-12-02 DIAGNOSIS — Z0000 Encounter for general adult medical examination without abnormal findings: Secondary | ICD-10-CM | POA: Insufficient documentation

## 2018-12-02 DIAGNOSIS — E785 Hyperlipidemia, unspecified: Secondary | ICD-10-CM | POA: Diagnosis not present

## 2018-12-02 DIAGNOSIS — I2510 Atherosclerotic heart disease of native coronary artery without angina pectoris: Secondary | ICD-10-CM

## 2018-12-02 NOTE — Assessment & Plan Note (Addendum)
Noted on today's EKG reviewed by me.  Concerning for occult CAD and prior MI.  She has atherosclerosis of RCA and aorta incidentally noted on 2017 CT abdomen.  Given her age and these findings,  I am recommending postponement of her knee surgery until she can be evaluated by cardiology .  Urgent cardiology referral made

## 2018-12-02 NOTE — Patient Instructions (Signed)
I am recommending that you have a cardiac evaluation PRIOR to your knee surgery to evaluate the abnormal EKG changes that suggest you have occult (not obvious) heart disease that may result in an event during or after your surgery

## 2018-12-02 NOTE — Progress Notes (Signed)
Subjective:  Patient ID: Emily Wu, female    DOB: 12-Apr-1931  Age: 85 y.o. MRN: [id number]  CC: The primary encounter diagnosis was Pre-op chest exam. Diagnoses of Pre-op evaluation, Left bundle branch block, Coronary atherosclerosis due to calcified coronary lesion, Left bundle branch block (LBBB) determined by electrocardiography, and Exertional dyspnea were also pertinent to this visit.  HPI Emily Wu presents for preoperative clearance requested by her orthopedist.  Preoperative medical clearance, requested by her orthopedist, for partial left knee replacement  On December 07, 2018. She has no known history of CAD  But has  atherosclerotic aortic and right coronary artery calcifications noted  Incidentally on a ABD CT done in Jan 2019 during evaluation for kidney stones.  She has not had any recent episodes of chest pain, but she does report shortness of breath with mild exertion. She denies claudication and jaw pain.  . She has hyperlipidemia.  She does not have diabetes ,  COPD  or hypertension .   She  Has mild CKD  Which is stable and most recent GFR is 51 ml/min by  surgical screening labs done on Nov 29 2018   EKG is abnormal today noting a LBBB and .an old anterior infarct.  These changes were present on 2015 EKG and per patient they "have been there for years"  But she has had no cardiac workup to date .  She has not had any surgical procedures in over 5 years    Outpatient Medications Prior to Visit  Medication Sig Dispense Refill  . acetaminophen (TYLENOL) 650 MG CR tablet Take 650 mg by mouth every 8 (eight) hours as needed for pain.     Marland Kitchen amitriptyline (ELAVIL) 25 MG tablet TAKE ONE TABLET EVERY DAY (Patient taking differently: Take 25 mg by mouth at bedtime. ) 90 tablet 1  . atorvastatin (LIPITOR) 20 MG tablet TAKE 1 TABLET BY MOUTH DAILY (Patient taking differently: Take 20 mg by mouth at bedtime. ) 90 tablet 1  . camphor-menthol (SARNA) lotion Apply 1 application  topically daily as needed for itching.    . Cyanocobalamin (VITAMIN B-12 PO) Take 1 tablet by mouth daily.    . diclofenac sodium (VOLTAREN) 1 % GEL APPLY 2 GRAMS TOPICALLY FOUR TIMES DAILY (Patient taking differently: Apply 2 g topically 4 (four) times daily as needed (for knee pain). ) 100 g 2  . escitalopram (LEXAPRO) 10 MG tablet Take 1 tablet (10 mg total) by mouth daily. (Patient taking differently: Take 10 mg by mouth at bedtime. ) 30 tablet 2  . estradiol (VIVELLE-DOT) 0.1 MG/24HR patch PLACE 1 PATCH ONTO SKIN TWICE A WEEK (Patient taking differently: Place 1 patch onto the skin 2 (two) times a week. ) 8 patch 0  . liothyronine (CYTOMEL) 5 MCG tablet TAKE TWO TABLETS EVERY DAY (Patient taking differently: Take 10 mcg by mouth daily. ) 180 tablet 0  . meloxicam (MOBIC) 7.5 MG tablet TAKE 1 TABLET BY MOUTH DAILY AS NEEDED FOR JOINT PAIN (Patient taking differently: Take 7.5 mg by mouth daily. ) 90 tablet 1  . Multiple Vitamin (MULTIVITAMIN) tablet Take 1 tablet by mouth daily.    . pantoprazole (PROTONIX) 40 MG tablet Take 1 tablet (40 mg total) by mouth daily. 90 tablet 1   No facility-administered medications prior to visit.     Review of Systems;  Patient denies headache, fevers, malaise, unintentional weight loss, skin rash, eye pain, sinus congestion and sinus pain, sore throat, dysphagia,  hemoptysis ,  cough, dyspnea, wheezing, chest pain, palpitations, orthopnea, edema, abdominal pain, nausea, melena, diarrhea, constipation, flank pain, dysuria, hematuria, urinary  Frequency, nocturia, numbness, tingling, seizures,  Focal weakness, Loss of consciousness,  Tremor, insomnia, depression, anxiety, and suicidal ideation.      Objective:  BP 128/70 (BP Location: Left Arm, Patient Position: Sitting, Cuff Size: Normal)   Pulse 73   Temp (!) 97.5 F (36.4 C) (Oral)   Resp 16   Wt 123 lb 6.4 oz (56 kg)   SpO2 99%   BMI 22.57 kg/m   BP Readings from Last 3 Encounters:  12/02/18  128/70  11/29/18 138/71  09/28/18 132/60    Wt Readings from Last 3 Encounters:  12/02/18 123 lb 6.4 oz (56 kg)  09/28/18 122 lb (55.3 kg)  03/24/18 119 lb 3.2 oz (54.1 kg)    General appearance: alert, cooperative and appears stated age Ears: normal TM's and external ear canals both ears Throat: lips, mucosa, and tongue normal; teeth and gums normal Neck: no adenopathy, no carotid bruit, supple, symmetrical, trachea midline and thyroid not enlarged, symmetric, no tenderness/mass/nodules Back: symmetric, no curvature. ROM normal. No CVA tenderness. Lungs: clear to auscultation bilaterally Heart: regular rate and rhythm, S1, S2 normal, no murmur, click, rub or gallop Abdomen: soft, non-tender; bowel sounds normal; no masses,  no organomegaly Pulses: 2+ and symmetric Skin: Skin color, texture, turgor normal. No rashes or lesions Lymph nodes: Cervical, supraclavicular, and axillary nodes normal.  No results found for: HGBA1C  Lab Results  Component Value Date   CREATININE 1.00 11/29/2018   CREATININE 1.16 09/28/2018   CREATININE 1.13 03/19/2018    Lab Results  Component Value Date   WBC 6.9 11/29/2018   HGB 13.4 11/29/2018   HCT 42.8 11/29/2018   PLT 260 11/29/2018   GLUCOSE 96 11/29/2018   CHOL 143 09/28/2018   TRIG 268.0 (H) 09/28/2018   HDL 30.30 (L) 09/28/2018   LDLDIRECT 72.0 09/28/2018   LDLCALC 73 03/19/2018   ALT 16 09/28/2018   AST 21 09/28/2018   NA 139 11/29/2018   K 4.4 11/29/2018   CL 104 11/29/2018   CREATININE 1.00 11/29/2018   BUN 19 11/29/2018   CO2 27 11/29/2018   TSH 2.77 09/28/2018    No results found.  Assessment & Plan:   Problem List Items Addressed This Visit    Exertional dyspnea   Relevant Orders   DG Chest 2 View   Left bundle branch block (LBBB) determined by electrocardiography    Noted on today's EKG reviewed by me.  Concerning for occult CAD and prior MI.  She has atherosclerosis of RCA and aorta incidentally noted on 2017 CT  abdomen.  Given her age and these findings,  I am recommending postponement of her knee surgery until she can be evaluated by cardiology .  Urgent cardiology referral made       Other Visit Diagnoses    Pre-op chest exam    -  Primary   Relevant Orders   DG Chest 2 View   Pre-op evaluation       Relevant Orders   EKG 12-Lead (Completed)   Ambulatory referral to Cardiology   Left bundle branch block       Relevant Orders   Ambulatory referral to Cardiology   Coronary atherosclerosis due to calcified coronary lesion       Relevant Orders   Ambulatory referral to Cardiology      I am having Jerene Pitch. Geiger maintain her multivitamin,  acetaminophen, amitriptyline, pantoprazole, escitalopram, liothyronine, diclofenac sodium, atorvastatin, meloxicam, estradiol, Cyanocobalamin (VITAMIN B-12 PO), and camphor-menthol.  No orders of the defined types were placed in this encounter.   There are no discontinued medications.  Follow-up: No follow-ups on file.   Crecencio Mc, MD

## 2018-12-03 ENCOUNTER — Telehealth: Payer: Self-pay | Admitting: Internal Medicine

## 2018-12-03 ENCOUNTER — Other Ambulatory Visit: Payer: Self-pay | Admitting: Orthopedic Surgery

## 2018-12-03 ENCOUNTER — Telehealth: Payer: Self-pay | Admitting: *Deleted

## 2018-12-03 DIAGNOSIS — I447 Left bundle-branch block, unspecified: Secondary | ICD-10-CM | POA: Diagnosis not present

## 2018-12-03 DIAGNOSIS — Z01810 Encounter for preprocedural cardiovascular examination: Secondary | ICD-10-CM | POA: Diagnosis not present

## 2018-12-03 NOTE — Telephone Encounter (Signed)
Copied from [place] [phone]. Topic: General - Other >> Dec 03, 2018  4:24 PM Keene Breath wrote: Reason for CRM: Patient called to request that Dr. Derrel Nip or her nurse call patient regarding her scheduled ortho surgery on 12/07/2018.  Patient is confused because she thinks that her surgery has been postponed but she received a call from the surgeon's office.  Not sure what she should do at this time.  Patient's surgery is scheduled with Dr. Marchia Bond.  Please call patient to explain what she should do.  CB# [phone] of [phone].

## 2018-12-03 NOTE — Telephone Encounter (Signed)
Copied from [place] [phone]. Topic: General - Other >> Dec 03, 2018  2:35 PM Rayann Heman wrote: Reason for CRM: pt calling to check status of surgical clearance. Please advise

## 2018-12-03 NOTE — Telephone Encounter (Signed)
error 

## 2018-12-03 NOTE — Telephone Encounter (Signed)
Tried twice to call patient and advise that clearance for surgery would have to come from cardiology, but no answer and no voicemail.  Form for cardiac clearance is being faxed to cardiology.

## 2018-12-03 NOTE — Care Plan (Signed)
Spoke with patient prior to surgery. Pending all clearances, she will discharge to home with family and HHPT. Rolling walker ordered for home use.   Ladell Heads, [place]

## 2018-12-03 NOTE — Telephone Encounter (Signed)
Please call back on cell [phone]

## 2018-12-06 NOTE — Telephone Encounter (Signed)
Spoke with pt and she stated that she has decided that with all se has going on right now that she does not want to have the knee surgery done at all now. She stated that with what is going on with her heart and her husband's dementia worsening that she feels like now is not a good time. Pt stated that she has just went back to taking tylenol everyday to help with her knee pain.

## 2018-12-06 NOTE — Telephone Encounter (Signed)
See previous telephone encounter.

## 2018-12-07 ENCOUNTER — Encounter (HOSPITAL_COMMUNITY): Admission: RE | Payer: Self-pay | Source: Home / Self Care

## 2018-12-07 ENCOUNTER — Ambulatory Visit (HOSPITAL_COMMUNITY): Admission: RE | Admit: 2018-12-07 | Payer: Medicare Other | Source: Home / Self Care | Admitting: Orthopedic Surgery

## 2018-12-07 SURGERY — ARTHROPLASTY, KNEE, UNICOMPARTMENTAL
Anesthesia: Choice | Laterality: Left

## 2018-12-08 ENCOUNTER — Emergency Department: Payer: Medicare Other

## 2018-12-08 ENCOUNTER — Telehealth: Payer: Self-pay

## 2018-12-08 ENCOUNTER — Observation Stay: Payer: Medicare Other

## 2018-12-08 ENCOUNTER — Other Ambulatory Visit: Payer: Self-pay

## 2018-12-08 ENCOUNTER — Observation Stay
Admission: EM | Admit: 2018-12-08 | Discharge: 2018-12-09 | Disposition: A | Discharge status: Home / Self Care | Payer: Medicare Other | Attending: Internal Medicine | Admitting: Internal Medicine

## 2018-12-08 ENCOUNTER — Encounter: Payer: Self-pay | Admitting: Emergency Medicine

## 2018-12-08 DIAGNOSIS — R079 Chest pain, unspecified: Principal | ICD-10-CM | POA: Insufficient documentation

## 2018-12-08 DIAGNOSIS — I447 Left bundle-branch block, unspecified: Secondary | ICD-10-CM | POA: Diagnosis not present

## 2018-12-08 DIAGNOSIS — Z885 Allergy status to narcotic agent status: Secondary | ICD-10-CM | POA: Diagnosis not present

## 2018-12-08 DIAGNOSIS — Z880 Allergy status to penicillin: Secondary | ICD-10-CM | POA: Diagnosis not present

## 2018-12-08 DIAGNOSIS — E0590 Thyrotoxicosis, unspecified without thyrotoxic crisis or storm: Secondary | ICD-10-CM | POA: Diagnosis not present

## 2018-12-08 DIAGNOSIS — E039 Hypothyroidism, unspecified: Secondary | ICD-10-CM | POA: Diagnosis not present

## 2018-12-08 DIAGNOSIS — M1990 Unspecified osteoarthritis, unspecified site: Secondary | ICD-10-CM | POA: Diagnosis not present

## 2018-12-08 DIAGNOSIS — K219 Gastro-esophageal reflux disease without esophagitis: Secondary | ICD-10-CM | POA: Diagnosis not present

## 2018-12-08 DIAGNOSIS — Z882 Allergy status to sulfonamides status: Secondary | ICD-10-CM | POA: Insufficient documentation

## 2018-12-08 DIAGNOSIS — Z79899 Other long term (current) drug therapy: Secondary | ICD-10-CM | POA: Diagnosis not present

## 2018-12-08 DIAGNOSIS — Z791 Long term (current) use of non-steroidal anti-inflammatories (NSAID): Secondary | ICD-10-CM | POA: Diagnosis not present

## 2018-12-08 DIAGNOSIS — R069 Unspecified abnormalities of breathing: Secondary | ICD-10-CM | POA: Diagnosis not present

## 2018-12-08 DIAGNOSIS — Z888 Allergy status to other drugs, medicaments and biological substances status: Secondary | ICD-10-CM | POA: Insufficient documentation

## 2018-12-08 DIAGNOSIS — E785 Hyperlipidemia, unspecified: Secondary | ICD-10-CM | POA: Diagnosis not present

## 2018-12-08 DIAGNOSIS — Z87891 Personal history of nicotine dependence: Secondary | ICD-10-CM | POA: Insufficient documentation

## 2018-12-08 DIAGNOSIS — Z9049 Acquired absence of other specified parts of digestive tract: Secondary | ICD-10-CM | POA: Diagnosis not present

## 2018-12-08 DIAGNOSIS — Z8541 Personal history of malignant neoplasm of cervix uteri: Secondary | ICD-10-CM | POA: Insufficient documentation

## 2018-12-08 DIAGNOSIS — R200 Anesthesia of skin: Secondary | ICD-10-CM | POA: Insufficient documentation

## 2018-12-08 DIAGNOSIS — I10 Essential (primary) hypertension: Secondary | ICD-10-CM | POA: Insufficient documentation

## 2018-12-08 DIAGNOSIS — I959 Hypotension, unspecified: Secondary | ICD-10-CM | POA: Insufficient documentation

## 2018-12-08 DIAGNOSIS — R2232 Localized swelling, mass and lump, left upper limb: Secondary | ICD-10-CM | POA: Insufficient documentation

## 2018-12-08 DIAGNOSIS — M79603 Pain in arm, unspecified: Secondary | ICD-10-CM | POA: Diagnosis not present

## 2018-12-08 DIAGNOSIS — R0609 Other forms of dyspnea: Secondary | ICD-10-CM | POA: Diagnosis present

## 2018-12-08 DIAGNOSIS — R52 Pain, unspecified: Secondary | ICD-10-CM | POA: Diagnosis not present

## 2018-12-08 DIAGNOSIS — R0602 Shortness of breath: Secondary | ICD-10-CM | POA: Diagnosis not present

## 2018-12-08 DIAGNOSIS — I200 Unstable angina: Secondary | ICD-10-CM

## 2018-12-08 DIAGNOSIS — M79602 Pain in left arm: Secondary | ICD-10-CM | POA: Insufficient documentation

## 2018-12-08 DIAGNOSIS — R609 Edema, unspecified: Secondary | ICD-10-CM

## 2018-12-08 LAB — TROPONIN I
Troponin I: <0.03 ng/mL (ref <0.03)
Troponin I: <0.03 ng/mL (ref <0.03)
Troponin I: <0.03 ng/mL (ref <0.03)

## 2018-12-08 LAB — APTT: aPTT: 30 seconds (ref 24–36)

## 2018-12-08 LAB — BASIC METABOLIC PANEL
Anion gap: 7 (ref 5–15)
BUN: 16 mg/dL (ref 8–23)
CO2: 24 mmol/L (ref 22–32)
Calcium: 8.7 mg/dL — ABNORMAL LOW (ref 8.9–10.3)
Chloride: 106 mmol/L (ref 98–111)
Creatinine, Ser: 0.87 mg/dL (ref 0.44–1.00)
GFR calc Af Amer: >60 mL/min (ref >60)
GFR calc non Af Amer: >60 mL/min (ref >60)
Glucose, Bld: 101 mg/dL — ABNORMAL HIGH (ref 70–99)
Potassium: 3.6 mmol/L (ref 3.5–5.1)
Sodium: 137 mmol/L (ref 135–145)

## 2018-12-08 LAB — CBC
HCT: 38.2 % (ref 36.0–46.0)
Hemoglobin: 12.7 g/dL (ref 12.0–15.0)
MCH: 31.2 pg (ref 26.0–34.0)
MCHC: 33.2 g/dL (ref 30.0–36.0)
MCV: 93.9 fL (ref 80.0–100.0)
Platelets: 222 10*3/uL (ref 150–400)
RBC: 4.07 MIL/uL (ref 3.87–5.11)
RDW: 12.9 % (ref 11.5–15.5)
WBC: 9.1 10*3/uL (ref 4.0–10.5)
nRBC: 0 % (ref 0.0–0.2)

## 2018-12-08 LAB — PROTIME-INR
INR: 1.04
Prothrombin Time: 13.5 seconds (ref 11.4–15.2)

## 2018-12-08 LAB — FIBRIN DERIVATIVES D-DIMER (ARMC ONLY): Fibrin derivatives D-dimer (ARMC): 825.01 ng/mL (FEU) — ABNORMAL HIGH (ref 0.00–499.00)

## 2018-12-08 LAB — TSH: TSH: 1.349 u[IU]/mL (ref 0.350–4.500)

## 2018-12-08 MED ORDER — HEPARIN SODIUM (PORCINE) 5000 UNIT/ML IJ SOLN
60.0000 [IU]/kg | Freq: Once | INTRAMUSCULAR | Status: AC
  Administered 2018-12-08: 3200 [IU] via INTRAVENOUS
  Filled 2018-12-08: qty 1

## 2018-12-08 MED ORDER — ATORVASTATIN CALCIUM 20 MG PO TABS
20.0000 mg | ORAL_TABLET | Freq: Every day | ORAL | Status: DC
  Administered 2018-12-08: 20 mg via ORAL
  Filled 2018-12-08: qty 1

## 2018-12-08 MED ORDER — AMITRIPTYLINE HCL 25 MG PO TABS
25.0000 mg | ORAL_TABLET | Freq: Every day | ORAL | Status: DC
  Administered 2018-12-08: 25 mg via ORAL
  Filled 2018-12-08: qty 1

## 2018-12-08 MED ORDER — SODIUM CHLORIDE 0.9 % IV BOLUS
1000.0000 mL | Freq: Once | INTRAVENOUS | Status: AC
  Administered 2018-12-08: 1000 mL via INTRAVENOUS

## 2018-12-08 MED ORDER — ACETAMINOPHEN 650 MG RE SUPP: 650.0000 mg | Freq: Four times a day (QID) | RECTAL | Status: DC | PRN

## 2018-12-08 MED ORDER — LIOTHYRONINE SODIUM 5 MCG PO TABS
10.0000 ug | ORAL_TABLET | Freq: Every day | ORAL | Status: DC
  Administered 2018-12-08 – 2018-12-09 (×2): 10 ug via ORAL
  Filled 2018-12-08 (×2): qty 2

## 2018-12-08 MED ORDER — MELOXICAM 7.5 MG PO TABS
7.5000 mg | ORAL_TABLET | Freq: Every day | ORAL | Status: DC
  Administered 2018-12-09: 7.5 mg via ORAL
  Filled 2018-12-08: qty 1

## 2018-12-08 MED ORDER — ONDANSETRON HCL 4 MG/2ML IJ SOLN: 4.0000 mg | Freq: Four times a day (QID) | INTRAMUSCULAR | Status: DC | PRN

## 2018-12-08 MED ORDER — SODIUM CHLORIDE 0.9 % IV SOLN
INTRAVENOUS | Status: DC
  Administered 2018-12-08 – 2018-12-09 (×2): via INTRAVENOUS

## 2018-12-08 MED ORDER — IOHEXOL 350 MG/ML SOLN
75.0000 mL | Freq: Once | INTRAVENOUS | Status: AC | PRN
  Administered 2018-12-08: 75 mL via INTRAVENOUS

## 2018-12-08 MED ORDER — PANTOPRAZOLE SODIUM 40 MG PO TBEC
40.0000 mg | DELAYED_RELEASE_TABLET | Freq: Every day | ORAL | Status: DC
  Administered 2018-12-09: 40 mg via ORAL
  Filled 2018-12-08: qty 1

## 2018-12-08 MED ORDER — ADULT MULTIVITAMIN W/MINERALS CH
1.0000 | ORAL_TABLET | Freq: Every day | ORAL | Status: DC
  Administered 2018-12-09: 1 via ORAL
  Filled 2018-12-08: qty 1

## 2018-12-08 MED ORDER — NITROGLYCERIN 0.4 MG SL SUBL
0.4000 mg | SUBLINGUAL_TABLET | SUBLINGUAL | Status: DC | PRN
  Administered 2018-12-08: 0.4 mg via SUBLINGUAL
  Filled 2018-12-08: qty 1

## 2018-12-08 MED ORDER — IBUPROFEN 400 MG PO TABS: 200.0000 mg | ORAL_TABLET | Freq: Four times a day (QID) | ORAL | Status: DC | PRN

## 2018-12-08 MED ORDER — ACETAMINOPHEN 325 MG PO TABS
650.0000 mg | ORAL_TABLET | Freq: Four times a day (QID) | ORAL | Status: DC | PRN
  Administered 2018-12-08: 650 mg via ORAL
  Filled 2018-12-08: qty 2

## 2018-12-08 MED ORDER — ONDANSETRON HCL 4 MG PO TABS: 4.0000 mg | ORAL_TABLET | Freq: Four times a day (QID) | ORAL | Status: DC | PRN

## 2018-12-08 MED ORDER — HEPARIN (PORCINE) 25000 UT/250ML-% IV SOLN
650.0000 [IU]/h | INTRAVENOUS | Status: DC
  Administered 2018-12-08: 650 [IU]/h via INTRAVENOUS
  Filled 2018-12-08: qty 250

## 2018-12-08 MED ORDER — ASPIRIN EC 81 MG PO TBEC
81.0000 mg | DELAYED_RELEASE_TABLET | Freq: Every day | ORAL | Status: DC
  Administered 2018-12-09: 81 mg via ORAL
  Filled 2018-12-08: qty 1

## 2018-12-08 MED ORDER — ESCITALOPRAM OXALATE 10 MG PO TABS
10.0000 mg | ORAL_TABLET | Freq: Every day | ORAL | Status: DC
  Administered 2018-12-08: 10 mg via ORAL
  Filled 2018-12-08 (×2): qty 1

## 2018-12-08 NOTE — Consult Note (Signed)
[facility] Cardiology  CARDIOLOGY CONSULT NOTE  Patient ID: Emily Wu MRN: [id number] DOB/AGE: Nov 25, 1930 85 y.o.   Admit date: 12/08/2018 Referring Physician Posey Pronto Primary Physician [facility] Primary Cardiologist Fath Reason for Consultation Shortness of breath  HPI: 85 year old female referred for evaluation of shortness of breath.  The patient has a history of hyperlipidemia, hypothyroidism, and GERD.  The patient has had a 66-month history of exertional shortness of breath.  Patient has been less active due to bilateral knee pain. The patient was evaluated recently by Doristine Mango, PA-C at [facility] cardiology for shortness of breath, as well as for preoperative ECG revealing LBBB.  A 2D echocardiogram was performed on 12/07/2018, which revealed normal left ventricular function, with LVEF greater than 55% with mild mitral, tricuspid, and pulmonic valve regurgitation.  The patient was in her usual state of health until she was awakened early this morning at 2 AM with heaviness in her left arm without radiation, shortness of breath, nausea, or chest pain. EMS was called and the patient received aspirin en route. ECG revealed sinus rhythm at a rate of 57 bpm with known left bundle branch block without evidence of ischemia.  She received 1 dose of SL nitroglycerin in the ER and had symptomatic hypotension with near syncope, though improved after IV fluids.  She was started on heparin drip with ECG revealing LBBB, and with concerns that her left arm pain could be an anginal equivalent. Troponin was less than 0.03 x 2. Chest CT negative for pulmonary emboli or effusion.  Mild coronary calcifications were noted.  Currently, the patient reports feeling better. She reports improvement in her left arm heaviness, and reports that it is hardly present. She denies chest pain or shortness of breath.   Past Medical History:  Diagnosis Date  . Arthritis    "knees, shoulders, chest" (02/03/2014)  . Bruises  easily   . Cervical cancer ([place]) 1965  . High cholesterol   . Hypertension    DENIES   . Tendency toward bleeding easily [facility])     Past Surgical History:  Procedure Laterality Date  . ABDOMINAL HYSTERECTOMY  1965  . APPENDECTOMY    . CHOLECYSTECTOMY    . KNEE ARTHROSCOPY Left    [facility]  . SHOULDER OPEN ROTATOR CUFF REPAIR Bilateral 2007,  2010   [facility]  . THROAT SURGERY  2014   "arthritis hugh knot" (02/03/2014)    Medications Prior to Admission  Medication Sig Dispense Refill Last Dose  . acetaminophen (TYLENOL) 650 MG CR tablet Take 650 mg by mouth every 8 (eight) hours as needed for pain.    Unknown at PRN  . amitriptyline (ELAVIL) 25 MG tablet TAKE ONE TABLET EVERY DAY (Patient taking differently: Take 25 mg by mouth at bedtime. ) 90 tablet 1 Past Week at 2100  . atorvastatin (LIPITOR) 20 MG tablet TAKE 1 TABLET BY MOUTH DAILY (Patient taking differently: Take 20 mg by mouth at bedtime. ) 90 tablet 1 Past Week at 2100  . camphor-menthol (SARNA) lotion Apply 1 application topically daily as needed for itching.   Unknown at PRN  . Cyanocobalamin (VITAMIN B-12 PO) Take 1 tablet by mouth daily.   Past Week at Unknown time  . diclofenac sodium (VOLTAREN) 1 % GEL APPLY 2 GRAMS TOPICALLY FOUR TIMES DAILY (Patient taking differently: Apply 2 g topically 4 (four) times daily as needed (for knee pain). ) 100 g 2 Past Week at PRN  . estradiol (VIVELLE-DOT) 0.1 MG/24HR patch PLACE 1 PATCH  ONTO SKIN TWICE A WEEK (Patient taking differently: Place 1 patch onto the skin 2 (two) times a week. ) 8 patch 0 As directed at As directed  . liothyronine (CYTOMEL) 5 MCG tablet TAKE TWO TABLETS EVERY DAY (Patient taking differently: Take 10 mcg by mouth daily. ) 180 tablet 0 Past Week at Unknown time  . meloxicam (MOBIC) 7.5 MG tablet TAKE 1 TABLET BY MOUTH DAILY AS NEEDED FOR JOINT PAIN (Patient taking differently: Take 7.5 mg by mouth daily. ) 90 tablet 1 Past Week at Unknown time  . Multiple Vitamin  (MULTIVITAMIN) tablet Take 1 tablet by mouth daily.   Past Week at Unknown time   Social History   Socioeconomic History  . Marital status: Married    Spouse name: Not on file  . Number of children: Not on file  . Years of education: Not on file  . Highest education level: Not on file  Occupational History  . Not on file  Social Needs  . Financial resource strain: Not on file  . Food insecurity:    Worry: Not on file    Inability: Not on file  . Transportation needs:    Medical: Not on file    Non-medical: Not on file  Tobacco Use  . Smoking status: Former Smoker    Packs/day: 2.00    Years: 7.00    Pack years: 14.00    Types: Cigarettes  . Smokeless tobacco: Never Used  . Tobacco comment: 02/03/2014 "quit smoking in the 1960's"  Substance and Sexual Activity  . Alcohol use: No  . Drug use: No  . Sexual activity: Never  Lifestyle  . Physical activity:    Days per week: Not on file    Minutes per session: Not on file  . Stress: Not on file  Relationships  . Social connections:    Talks on phone: Not on file    Gets together: Not on file    Attends religious service: Not on file    Active member of club or organization: Not on file    Attends meetings of clubs or organizations: Not on file    Relationship status: Not on file  . Intimate partner violence:    Fear of current or ex partner: Not on file    Emotionally abused: Not on file    Physically abused: Not on file    Forced sexual activity: Not on file  Other Topics Concern  . Not on file  Social History Narrative  . Not on file    Family History  Problem Relation Age of Onset  . Peripheral vascular disease Mother   . Stroke Father   . Hyperlipidemia Father   . Heart disease Father 72  . Cancer Neg Hx       Review of systems complete and found to be negative unless listed above      PHYSICAL EXAM  General: Well developed, well nourished, in no acute distress, lying in bed HEENT:  Normocephalic  and atramatic Neck:  No JVD.  Lungs: normal effort of breathing on room air Heart: HRRR . Normal S1 and S2 without gallops or murmurs.  Abdomen: Bowel sounds are positive, abdomen soft and non-tender  Msk:  Back normal, normal gait. Hand strength equal bilaterally. No obvious deformity. Radial pulses palpable bilaterally. Extremities: No clubbing, cyanosis or edema.   Neuro: Alert and oriented X 3. Psych:  Good affect, responds appropriately  Labs:   Lab Results  Component Value Date  WBC 9.1 12/08/2018   HGB 12.7 12/08/2018   HCT 38.2 12/08/2018   MCV 93.9 12/08/2018   PLT 222 12/08/2018    Recent Labs  Lab 12/08/18 0854  NA 137  K 3.6  CL 106  CO2 24  BUN 16  CREATININE 0.87  CALCIUM 8.7*  GLUCOSE 101*   Lab Results  Component Value Date   TROPONINI <0.03 12/08/2018    Lab Results  Component Value Date   CHOL 143 09/28/2018   CHOL 142 03/19/2018   CHOL 139 12/14/2017   Lab Results  Component Value Date   HDL 30.30 (L) 09/28/2018   HDL 38.20 (L) 03/19/2018   HDL 33.70 (L) 12/14/2017   Lab Results  Component Value Date   LDLCALC 73 03/19/2018   LDLCALC 75 12/14/2017   LDLCALC 81 03/09/2012   Lab Results  Component Value Date   TRIG 268.0 (H) 09/28/2018   TRIG 150.0 (H) 03/19/2018   TRIG 152.0 (H) 12/14/2017   Lab Results  Component Value Date   CHOLHDL 5 09/28/2018   CHOLHDL 4 03/19/2018   CHOLHDL 4 12/14/2017   Lab Results  Component Value Date   LDLDIRECT 72.0 09/28/2018   LDLDIRECT 87.0 04/15/2017   LDLDIRECT 97.0 01/03/2016      Radiology: Dg Chest 2 View  Result Date: 12/08/2018 CLINICAL DATA:  Chest pain.  Acute left shoulder and arm pain. EXAM: CHEST - 2 VIEW COMPARISON:  Radiographs dated 12/02/2018 and 02/05/2014 FINDINGS: The heart size and mediastinal contours are within normal limits. Both lungs are clear. No acute bone abnormality. IMPRESSION: No active cardiopulmonary disease. Electronically Signed   By: Lorriane Shire M.D.    On: 12/08/2018 09:13   Dg Chest 2 View  Result Date: 12/02/2018 CLINICAL DATA:  Shortness of breath on exertion EXAM: CHEST - 2 VIEW COMPARISON:  11/06/2016 FINDINGS: Cardiac shadows within normal limits. The lungs are well aerated bilaterally. Bilateral breast implants are noted. No focal infiltrate or sizable effusion is seen. No bony abnormality is noted. IMPRESSION: No active cardiopulmonary disease. Electronically Signed   By: Inez Catalina M.D.   On: 12/02/2018 19:34   Ct Angio Chest Pe W Or Wo Contrast  Result Date: 12/08/2018 CLINICAL DATA:  Hypertension and shortness of breath EXAM: CT ANGIOGRAPHY CHEST WITH CONTRAST TECHNIQUE: Multidetector CT imaging of the chest was performed using the standard protocol during bolus administration of intravenous contrast. Multiplanar CT image reconstructions and MIPs were obtained to evaluate the vascular anatomy. CONTRAST:  42mL OMNIPAQUE IOHEXOL 350 MG/ML SOLN COMPARISON:  Plain film from earlier in the same day FINDINGS: Cardiovascular: Thoracic aorta shows mild atherosclerotic change without aneurysmal dilatation or dissection. No cardiac enlargement is seen. Mild coronary calcifications are noted. The pulmonary artery shows a normal branching pattern without intraluminal filling defect to suggest pulmonary embolism. Mediastinum/Nodes: Thoracic inlet is within normal limits. No sizable hilar or mediastinal adenopathy is noted. The esophagus is within normal limits. Lungs/Pleura: The lungs are well aerated bilaterally. No focal infiltrate or sizable effusion is noted. 5 mm nodule is noted in the left upper lobe best seen on image number 16 of series 6. No other sizable nodule is seen. Upper Abdomen: Visualized upper abdomen is within normal limits. Musculoskeletal: Bilateral breast implants are noted. No definitive rupture is seen. The bony structures show no degenerative change of the thoracic spine. No acute compression deformity is noted. Review of the MIP  images confirms the above findings. IMPRESSION: No evidence of pulmonary emboli. 5 mm nodule  in the left upper lobe as described. No follow-up needed if patient is low-risk. Non-contrast chest CT can be considered in 12 months if patient is high-risk. This recommendation follows the consensus statement: Guidelines for Management of Incidental Pulmonary Nodules Detected on CT Images: From the Fleischner Society 2017; Radiology 2017; 284:228-243. No other focal abnormality is noted. Aortic Atherosclerosis (ICD10-I70.0). Electronically Signed   By: Inez Catalina M.D.   On: 12/08/2018 10:41    EKG: sinus rhythm  ASSESSMENT AND PLAN:  1. Exertional shortness of breath for 6 months, with 2D echocardiogram revealing normal left ventricular function without significant valvular insufficiency. Chest xray negative for cardiomegaly, pneumonia, or effusions. Chest Ct negative for pulmonary emboli, with mild coronary calcifications. 2. Acute onset of left arm heaviness, initially concerning for anginal equivalent, which was nonradiating, without associated chest pain, shortness of breath, nausea, diaphoresis, or vomiting, started on heparin drip. Troponin less than 0.03 x2. 3. Known LBBB 4. Hyperlipidemia  Recommendations: 1. Discontinue heparin drip 2. Will schedule Lexiscan Myoview for the morning 3. Further recommendations pending patient's initial course and results of [place]  Signed: Clabe Seal PA-C 12/08/2018, 2:39 PM

## 2018-12-08 NOTE — H&P (Signed)
[place] [facility] [place] NAME: Emily Wu    MR#:  [id number]  DATE OF BIRTH:  1931-03-26  DATE OF ADMISSION:  12/08/2018  PRIMARY CARE PHYSICIAN: Crecencio Mc, MD   REQUESTING/REFERRING PHYSICIAN: Carrie Mew, MD  CHIEF COMPLAINT:   Chief Complaint  Patient presents with  . Arm Pain    HISTORY OF PRESENT ILLNESS: Emily Wu  is a 85 y.o. female with a known history of osteoarthritis, hyperlipidemia, hypertension who is presenting to the emergency room with left arm pain.  Patient states that she has been having shortness of breath which she was referred for cardiology for outpatient evaluation.  States that the shortness of breath was occurring with exertion.  Patient had a echocardiogram of the heart which showed normal ejection fraction mild tricuspid and mild mitral regurg were noted.  Patient denies any chest pains.  Denies any nausea vomiting.  Patient today started having left arm pain and came to the emergency room.  In the emergency room she was given 1 sublingual nitroglycerin and her blood pressure dropped.  Patient became hypotensive.  And almost passed out.  She had to receive IV fluids.  Now she is feeling better.  PAST MEDICAL HISTORY:   Past Medical History:  Diagnosis Date  . Arthritis    "knees, shoulders, chest" (02/03/2014)  . Bruises easily   . Cervical cancer ([place]) 1965  . High cholesterol   . Hypertension    DENIES   . Tendency toward bleeding easily ([place])     PAST SURGICAL HISTORY:  Past Surgical History:  Procedure Laterality Date  . ABDOMINAL HYSTERECTOMY  1965  . APPENDECTOMY    . CHOLECYSTECTOMY    . KNEE ARTHROSCOPY Left    [facility]  . SHOULDER OPEN ROTATOR CUFF REPAIR Bilateral 2007,  2010   [facility]  . THROAT SURGERY  2014   "arthritis hugh knot" (02/03/2014)    SOCIAL HISTORY:  Social History   Tobacco Use  . Smoking status: Former Smoker    Packs/day: 2.00    Years: 7.00    Pack years:  14.00    Types: Cigarettes  . Smokeless tobacco: Never Used  . Tobacco comment: 02/03/2014 "quit smoking in the 1960's"  Substance Use Topics  . Alcohol use: No    FAMILY HISTORY:  Family History  Problem Relation Age of Onset  . Peripheral vascular disease Mother   . Stroke Father   . Hyperlipidemia Father   . Heart disease Father 65  . Cancer Neg Hx     DRUG ALLERGIES:  Allergies  Allergen Reactions  . Tramadol Anaphylaxis and Other (See Comments)    Lowered Blood Pressure  . Amoxicillin Rash  . Promethazine Hcl Rash  . Sulfa Drugs Cross Reactors Rash    REVIEW OF SYSTEMS:   CONSTITUTIONAL: No fever, fatigue or weakness.  EYES: No blurred or double vision.  EARS, NOSE, AND THROAT: No tinnitus or ear pain.  RESPIRATORY: No cough, positive shortness of breath, wheezing or hemoptysis.  CARDIOVASCULAR: No chest pain, orthopnea, edema.  GASTROINTESTINAL: No nausea, vomiting, diarrhea or abdominal pain.  GENITOURINARY: No dysuria, hematuria.  ENDOCRINE: No polyuria, nocturia,  HEMATOLOGY: No anemia, easy bruising or bleeding SKIN: No rash or lesion. MUSCULOSKELETAL: No joint pain or arthritis.  Positive left arm pain NEUROLOGIC: No tingling, numbness, weakness.  PSYCHIATRY: No anxiety or depression.   MEDICATIONS AT HOME:  Prior to Admission medications   Medication Sig Start Date End Date Taking?  Authorizing Provider  acetaminophen (TYLENOL) 650 MG CR tablet Take 650 mg by mouth every 8 (eight) hours as needed for pain.     [provider]  amitriptyline (ELAVIL) 25 MG tablet TAKE ONE TABLET EVERY DAY Patient taking differently: Take 25 mg by mouth at bedtime.  06/15/17   Crecencio Mc, MD  atorvastatin (LIPITOR) 20 MG tablet TAKE 1 TABLET BY MOUTH DAILY Patient taking differently: Take 20 mg by mouth at bedtime.  08/03/18   Crecencio Mc, MD  camphor-menthol [facility]) lotion Apply 1 application topically daily as needed for itching.    [provider]  Cyanocobalamin (VITAMIN B-12 PO) Take 1 tablet by mouth daily.    [provider]  diclofenac sodium (VOLTAREN) 1 % GEL APPLY 2 GRAMS TOPICALLY FOUR TIMES DAILY Patient taking differently: Apply 2 g topically 4 (four) times daily as needed (for knee pain).  07/27/18   Crecencio Mc, MD  escitalopram (LEXAPRO) 10 MG tablet Take 1 tablet (10 mg total) by mouth daily. Patient taking differently: Take 10 mg by mouth at bedtime.  03/24/18   Crecencio Mc, MD  estradiol (VIVELLE-DOT) 0.1 MG/24HR patch PLACE 1 PATCH ONTO SKIN TWICE A WEEK Patient taking differently: Place 1 patch onto the skin 2 (two) times a week.  11/02/18   Crecencio Mc, MD  liothyronine (CYTOMEL) 5 MCG tablet TAKE TWO TABLETS EVERY DAY Patient taking differently: Take 10 mcg by mouth daily.  07/07/18   Crecencio Mc, MD  meloxicam (MOBIC) 7.5 MG tablet TAKE 1 TABLET BY MOUTH DAILY AS NEEDED FOR JOINT PAIN Patient taking differently: Take 7.5 mg by mouth daily.  08/25/18   Crecencio Mc, MD  Multiple Vitamin (MULTIVITAMIN) tablet Take 1 tablet by mouth daily.    [provider]  pantoprazole (PROTONIX) 40 MG tablet Take 1 tablet (40 mg total) by mouth daily. 03/24/18   Crecencio Mc, MD      PHYSICAL EXAMINATION:   VITAL SIGNS: Blood pressure (!) 105/46, pulse 63, temperature 98.4 F (36.9 C), temperature source Oral, resp. rate 19, height 5\' 2"  (1.575 m), weight 53.1 kg, SpO2 95 %.  GENERAL:  85 y.o.-year-old patient lying in the bed with no acute distress.  EYES: Pupils equal, round, reactive to light and accommodation. No scleral icterus. Extraocular muscles intact.  HEENT: Head atraumatic, normocephalic. Oropharynx and nasopharynx clear.  NECK:  Supple, no jugular venous distention. No thyroid enlargement, no tenderness.  LUNGS: Normal breath sounds bilaterally, no wheezing, rales,rhonchi or crepitation. No use of accessory muscles of respiration.  CARDIOVASCULAR: S1, S2 normal. No murmurs, rubs,  or gallops.  ABDOMEN: Soft, nontender, nondistended. Bowel sounds present. No organomegaly or mass.  EXTREMITIES: No pedal edema, cyanosis, or clubbing.  NEUROLOGIC: Cranial nerves II through XII are intact. Muscle strength 5/5 in all extremities. Sensation intact. Gait not checked.  PSYCHIATRIC: The patient is alert and oriented x 3.  SKIN: No obvious rash, lesion, or ulcer.   LABORATORY PANEL:   CBC Recent Labs  Lab 12/08/18 0854  WBC 9.1  HGB 12.7  HCT 38.2  PLT 222  MCV 93.9  MCH 31.2  MCHC 33.2  RDW 12.9   ------------------------------------------------------------------------------------------------------------------  Chemistries  Recent Labs  Lab 12/08/18 0854  NA 137  K 3.6  CL 106  CO2 24  GLUCOSE 101*  BUN 16  CREATININE 0.87  CALCIUM 8.7*   ------------------------------------------------------------------------------------------------------------------ estimated creatinine clearance is 37.4 mL/min (by C-G formula based on SCr of  0.87 mg/dL). ------------------------------------------------------------------------------------------------------------------ No results for input(s): TSH, T4TOTAL, T3FREE, THYROIDAB in the last 72 hours.  Invalid input(s): FREET3   Coagulation profile No results for input(s): INR, PROTIME in the last 168 hours. ------------------------------------------------------------------------------------------------------------------- No results for input(s): DDIMER in the last 72 hours. -------------------------------------------------------------------------------------------------------------------  Cardiac Enzymes Recent Labs  Lab 12/08/18 0854  TROPONINI <0.03   ------------------------------------------------------------------------------------------------------------------ Invalid input(s):  POCBNP  ---------------------------------------------------------------------------------------------------------------  Urinalysis    Component Value Date/Time   COLORURINE YELLOW (A) 12/18/2017 1510   APPEARANCEUR CLOUDY (A) 12/18/2017 1510   LABSPEC 1.018 12/18/2017 1510   PHURINE 5.0 12/18/2017 1510   GLUCOSEU NEGATIVE 12/18/2017 1510   HGBUR SMALL (A) 12/18/2017 1510   BILIRUBINUR NEGATIVE 12/18/2017 1510   KETONESUR 5 (A) 12/18/2017 1510   PROTEINUR NEGATIVE 12/18/2017 1510   UROBILINOGEN 0.2 02/13/2011 1413   NITRITE NEGATIVE 12/18/2017 1510   LEUKOCYTESUR LARGE (A) 12/18/2017 1510     RADIOLOGY: Dg Chest 2 View  Result Date: 12/08/2018 CLINICAL DATA:  Chest pain.  Acute left shoulder and arm pain. EXAM: CHEST - 2 VIEW COMPARISON:  Radiographs dated 12/02/2018 and 02/05/2014 FINDINGS: The heart size and mediastinal contours are within normal limits. Both lungs are clear. No acute bone abnormality. IMPRESSION: No active cardiopulmonary disease. Electronically Signed   By: Lorriane Shire M.D.   On: 12/08/2018 09:13    EKG: Orders placed or performed during the hospital encounter of 12/08/18  . ED EKG within 10 minutes  . ED EKG within 10 minutes  . ED EKG  . ED EKG    IMPRESSION AND PLAN: Patient is 85 year old presenting with shortness of breath  1.  Shortness of breath with left arm pain possible anginal equivalent Placed under observation she started on heparin drip which I will continue for now Recent echocardiogram shows normal ejection fraction I will order a d-dimer and CT scan of the chest to further evaluate her symptoms Cardiology has been notified  2.  Hyperlipidemia continue Lipitor  3.  Hypothyroidism continue Cytomel we will check thyroid function  4.  GERD continue proton  5.  Miscellaneous heparin for DVT prophylaxis    All the records are reviewed and case discussed with ED provider. Management plans discussed with the patient, family and  they are in agreement.  CODE STATUS: Code Status History    Date Active Date Inactive Code Status Order ID Comments User Context   02/03/2014 1639 02/04/2014 1638 Full Code [id number]  Melton Alar, PA-C Inpatient       TOTAL TIME TAKING CARE OF THIS PATIENT:55 minutes.    Dustin Flock M.D on 12/08/2018 at 9:33 AM  Between 7am to 6pm - Pager - [phone]  After 6pm go to [email or web address] - password [email or web address]  [facility] Office  [phone]  CC: Primary care physician; Crecencio Mc, MD

## 2018-12-08 NOTE — Telephone Encounter (Signed)
FYI   Copied from [place] [phone]. Topic: General - Other >> Dec 08, 2018  3:46 PM Windy Kalata wrote: Reason for CRM: Patient's daughter [name] called to let Dr. Derrel Nip know that Jkayla is in the hospital, had arm pain this morning and ambulance came to get her, she is at [place] call back is [phone] this is the hospital phone number in her room

## 2018-12-08 NOTE — ED Notes (Signed)
Patient transported to X-ray 

## 2018-12-08 NOTE — ED Notes (Signed)
Patient transported to CT 

## 2018-12-08 NOTE — ED Provider Notes (Signed)
[facility] Emergency Department Provider Note  ____________________________________________  Time seen: Approximately 9:25 AM  I have reviewed the triage vital signs and the nursing notes.   HISTORY  Chief Complaint Arm Pain    HPI Emily Wu is a 85 y.o. female with a history of hypertension hyperlipidemia and dyspnea on exertion for the past 6 months who was awakened last night at 2:00 AM with heaviness in the left arm.  It is been constant and severe since then.  Nonradiating.  No aggravating or alleviating factors.  Brought to the ED by EMS who gave 324 mg of aspirin en route.  Denies chest pain.  Shortness of breath is worse.  No vomiting or diaphoresis at home.  No other pain syndrome.  She reports at bedtime last night she was feeling like her usual state of health, allowing for the chronic shortness of breath she has been experiencing.   No prior history of left arm pain or injury or musculoskeletal issues.   Past Medical History:  Diagnosis Date  . Arthritis    "knees, shoulders, chest" (02/03/2014)  . Bruises easily   . Cervical cancer ([place]) 1965  . High cholesterol   . Hypertension    DENIES   . Tendency toward bleeding easily [facility])      Patient Active Problem List   Diagnosis Date Noted  . Left bundle branch block (LBBB) determined by electrocardiography 12/02/2018  . GERD (gastroesophageal reflux disease) 09/29/2018  . Anxiety disorder 03/25/2018  . Postprandial diarrhea 04/18/2017  . Knee pain, bilateral 05/27/2016  . Exertional dyspnea 04/15/2016  . Left carotid bruit 04/14/2016  . Menopausal hot flushes 08/07/2015  . H/O malignant neoplasm of skin 06/25/2015  . Hypothyroidism 01/31/2015  . History of cervical cancer 01/10/2015  . Breast cancer screening 01/10/2015  . Hyperlipidemia 02/03/2014  . Medicare annual wellness visit, subsequent 01/19/2014  . History of IBS 01/19/2014  . S/P breast augmentation 01/19/2014  .  Colon polyps 01/30/2012  . Low back pain potentially associated with radiculopathy 01/30/2012  . Recurrent colitis due to Clostridium difficile 01/19/2011    Class: History of     Past Surgical History:  Procedure Laterality Date  . ABDOMINAL HYSTERECTOMY  1965  . APPENDECTOMY    . CHOLECYSTECTOMY    . KNEE ARTHROSCOPY Left    [place]  . SHOULDER OPEN ROTATOR CUFF REPAIR Bilateral 2007,  2010   [facility]  . THROAT SURGERY  2014   "arthritis hug[place] knot" (02/03/2014)     Prior to Admission medications   Medication Sig Start Date End Date Taking? Authorizing Provider  acetaminophen (TYLENOL) 650 MG CR tablet Take 650 mg by mouth every 8 (eight) hours as needed for pain.     [provider]  amitriptyline (ELAVIL) 25 MG tablet TAKE ONE TABLET EVERY DAY Patient taking differently: Take 25 mg by mouth at bedtime.  06/15/17   Crecencio Mc, MD  atorvastatin (LIPITOR) 20 MG tablet TAKE 1 TABLET BY MOUTH DAILY Patient taking differently: Take 20 mg by mouth at bedtime.  08/03/18   Crecencio Mc, MD  camphor-menthol [facility]) lotion Apply 1 application topically daily as needed for itching.    [provider]  Cyanocobalamin (VITAMIN B-12 PO) Take 1 tablet by mouth daily.    [provider]  diclofenac sodium (VOLTAREN) 1 % GEL APPLY 2 GRAMS TOPICALLY FOUR TIMES DAILY Patient taking differently: Apply 2 g topically 4 (four) times daily as needed (for knee pain).  07/27/18   Crecencio Mc, MD  escitalopram (LEXAPRO) 10 MG tablet Take 1 tablet (10 mg total) by mouth daily. Patient taking differently: Take 10 mg by mouth at bedtime.  03/24/18   Crecencio Mc, MD  estradiol (VIVELLE-DOT) 0.1 MG/24HR patch PLACE 1 PATCH ONTO SKIN TWICE A WEEK Patient taking differently: Place 1 patch onto the skin 2 (two) times a week.  11/02/18   Crecencio Mc, MD  liothyronine (CYTOMEL) 5 MCG tablet TAKE TWO TABLETS EVERY DAY Patient taking differently: Take 10 mcg by mouth daily.  07/07/18    Crecencio Mc, MD  meloxicam (MOBIC) 7.5 MG tablet TAKE 1 TABLET BY MOUTH DAILY AS NEEDED FOR JOINT PAIN Patient taking differently: Take 7.5 mg by mouth daily.  08/25/18   Crecencio Mc, MD  Multiple Vitamin (MULTIVITAMIN) tablet Take 1 tablet by mouth daily.    [provider]  pantoprazole (PROTONIX) 40 MG tablet Take 1 tablet (40 mg total) by mouth daily. 03/24/18   Crecencio Mc, MD     Allergies Tramadol; Amoxicillin; Promethazine hcl; and Sulfa drugs cross reactors   Family History  Problem Relation Age of Onset  . Peripheral vascular disease Mother   . Stroke Father   . Hyperlipidemia Father   . Heart disease Father 58  . Cancer Neg Hx     Social History Social History   Tobacco Use  . Smoking status: Former Smoker    Packs/day: 2.00    Years: 7.00    Pack years: 14.00    Types: Cigarettes  . Smokeless tobacco: Never Used  . Tobacco comment: 02/03/2014 "quit smoking in the 1960's"  Substance Use Topics  . Alcohol use: No  . Drug use: No    Review of Systems  Constitutional:   No fever or chills.  ENT:   No sore throat. No rhinorrhea. Cardiovascular:   No chest pain or syncope. Respiratory:   Positive shortness of breath without cough. Gastrointestinal:   Negative for abdominal pain, vomiting and diarrhea.  Musculoskeletal: Positive left arm pain All other systems reviewed and are negative except as documented above in ROS and HPI.  ____________________________________________   PHYSICAL EXAM:  VITAL SIGNS: ED Triage Vitals  Enc Vitals Group     BP 12/08/18 0847 130/61     Pulse Rate 12/08/18 0842 65     Resp 12/08/18 0842 19     Temp 12/08/18 0842 98.4 F (36.9 C)     Temp Source 12/08/18 0842 Oral     SpO2 12/08/18 0842 100 %     Weight 12/08/18 0846 117 lb (53.1 kg)     Height 12/08/18 0846 5\' 2"  (1.575 m)     Head Circumference --      Peak Flow --      Pain Score 12/08/18 0846 6     Pain Loc --      Pain Edu? --      Excl.  in [place]? --     Vital signs reviewed, nursing assessments reviewed.   Constitutional:   Alert and oriented.  Ill-appearing Eyes:   Conjunctivae are normal. EOMI. PERRL. ENT      Head:   Normocephalic and atraumatic.      Nose:   No congestion/rhinnorhea.       Mouth/Throat:   MMM, no pharyngeal erythema. No peritonsillar mass.       Neck:   No meningismus. Full ROM. Hematological/Lymphatic/Immunilogical:   No cervical lymphadenopathy. Cardiovascular:  RRR. Symmetric bilateral radial and DP pulses.  No murmurs. Cap refill less than 2 seconds. Respiratory:   Normal respiratory effort without tachypnea/retractions. Breath sounds are clear and equal bilaterally. No wheezes/rales/rhonchi. Gastrointestinal:   Soft and nontender. Non distended. There is no CVA tenderness.  No rebound, rigidity, or guarding. Musculoskeletal:   Normal range of motion in all extremities. No joint effusions.  No lower extremity tenderness.  No edema. Neurologic:   Normal speech and language.  Motor grossly intact. No acute focal neurologic deficits are appreciated.  Skin:    Skin is warm, dry and intact. No rash noted.  No petechiae, purpura, or bullae.  ____________________________________________    LABS (pertinent positives/negatives) (all labs ordered are listed, but only abnormal results are displayed) Labs Reviewed  BASIC METABOLIC PANEL - Abnormal; Notable for the following components:      Result Value   Glucose, Bld 101 (*)    Calcium 8.7 (*)    All other components within normal limits  CBC  TROPONIN I   ____________________________________________   EKG  Interpreted by me Sinus rhythm rate of 62, left axis, left bundle branch block.  No acute ischemic changes.  Repeat EKG at 9:18 AM during near syncopal event Sinus rhythm rate of 57, left axis, left bundle branch block, no ischemic changes or interval change.  ____________________________________________    RADIOLOGY  Dg Chest 2  View  Result Date: 12/08/2018 CLINICAL DATA:  Chest pain.  Acute left shoulder and arm pain. EXAM: CHEST - 2 VIEW COMPARISON:  Radiographs dated 12/02/2018 and 02/05/2014 FINDINGS: The heart size and mediastinal contours are within normal limits. Both lungs are clear. No acute bone abnormality. IMPRESSION: No active cardiopulmonary disease. Electronically Signed   By: Lorriane Shire M.D.   On: 12/08/2018 09:13    ____________________________________________   PROCEDURES .Critical Care Performed by: Carrie Mew, MD Authorized by: Carrie Mew, MD   Critical care provider statement:    Critical care time (minutes):  35   Critical care time was exclusive of:  Separately billable procedures and treating other patients   Critical care was necessary to treat or prevent imminent or life-threatening deterioration of the following conditions:  Cardiac failure   Critical care was time spent personally by me on the following activities:  Development of treatment plan with patient or surrogate, discussions with consultants, evaluation of patient's response to treatment, examination of patient, obtaining history from patient or surrogate, ordering and performing treatments and interventions, ordering and review of laboratory studies, ordering and review of radiographic studies, pulse oximetry, re-evaluation of patient's condition and review of old charts    ____________________________________________  DIFFERENTIAL DIAGNOSIS   Unstable angina, non-STEMI, GERD, pneumonia, pulmonary embolism  CLINICAL IMPRESSION / ASSESSMENT AND PLAN / ED COURSE  Pertinent labs & imaging results that were available during my care of the patient were reviewed by me and considered in my medical decision making (see chart for details).    Patient presents with exertional shortness of breath for the past 6 months.  Saw Dr. Ubaldo Glassing last week to begin cardiology evaluation of the symptoms.  Now with sudden left  arm heaviness, constant, most concerning for unstable angina.  EKG nondiagnostic.  With evolving symptoms and near syncope in the ED, start heparin infusion.  Full dose aspirin given by EMS.  Unable to tolerate nitroglycerin.  Labs and chest x-ray are unremarkable.  Clinical Course as of Dec 08 930  Wed Dec 08, 2018  2229 After first nitroglycerin, Pt  had sudden episode of nausea, bradycardia, hypotension with BP 77/50. No syncope or loss of pulses.  Ongoing arm pain. Will need to admit for further cardiac monitoring and workup.   [PS]  [id number] Cxr unremarkable. Will plan to start on heparin infusion for unstable angina given sx. Pt on defib monitor and pacer pads.   [PS]    Clinical Course User Index [PS] Carrie Mew, MD    ----------------------------------------- 9:29 AM on 12/08/2018 -----------------------------------------  Discussed with cardiology Dr. Saralyn Pilar.  No further recommendations at this time.  ----------------------------------------- 9:32 AM on 12/08/2018 -----------------------------------------  Discussed with primed for further evaluation and management.   ____________________________________________   FINAL CLINICAL IMPRESSION(S) / ED DIAGNOSES    Final diagnoses:  Unstable angina (HCC)  Shortness of breath  Left arm pain     ED Discharge Orders    None      Portions of this note were generated with dragon dictation software. Dictation errors may occur despite best attempts at proofreading.   Carrie Mew, MD 12/08/18 [phone]

## 2018-12-08 NOTE — Progress Notes (Signed)
Advanced care plan.  Purpose of the Encounter: CODE STATUS  Parties in Attendance: Patient herself and husband  Patient's Decision Capacity: Intact  Subjective/Patient's story: Emily Wu  is a 85 y.o. female with a known history of osteoarthritis, hyperlipidemia, hypertension who is presenting to the emergency room with left arm pain.  Patient states that she has been having shortness of breath which she was referred for cardiology for outpatient evaluation   Objective/Medical story  I discussed with the patient regarding her desires for cardiac and pulmonary resuscitation  Goals of care determination:   Patient states that she would like to be a full code for now she wants to think about her CODE STATUS  CODE STATUS:  Full code  Time spent discussing advanced care planning: 16 minutes

## 2018-12-08 NOTE — ED Triage Notes (Signed)
Started with acute left shoulder/arm pain that woke her out of her sleep last night. Has also had some SHOB with exertion. No history of same. Was supposed to have knee surgery but cancelled after EKG changes seen at preop appt. Pain constant since last night. 325 mg ASA by EMS

## 2018-12-08 NOTE — Progress Notes (Addendum)
ANTICOAGULATION CONSULT NOTE - Initial Consult  Pharmacy Consult for heparin Indication: chest pain/ACS - unstable angina   Allergies  Allergen Reactions  . Tramadol Anaphylaxis and Other (See Comments)    Lowered Blood Pressure  . Amoxicillin Rash  . Promethazine Hcl Rash  . Sulfa Drugs Cross Reactors Rash    Patient Measurements: Height: 5\' 2"  (157.5 cm) Weight: 117 lb (53.1 kg) IBW/kg (Calculated) : 50.1 Heparin Dosing Weight: 53.1 kg  Vital Signs: Temp: 98.4 F (36.9 C) (01/15 0842) Temp Source: Oral (01/15 0842) BP: 91/53 (01/15 0922) Pulse Rate: 61 (01/15 0922)  Labs: Recent Labs    12/08/18 0854  HGB 12.7  HCT 38.2  PLT 222  CREATININE 0.87  TROPONINI <0.03    Estimated Creatinine Clearance: 37.4 mL/min (by C-G formula based on SCr of 0.87 mg/dL).   Medical History: Past Medical History:  Diagnosis Date  . Arthritis    "knees, shoulders, chest" (02/03/2014)  . Bruises easily   . Cervical cancer ([place]) 1965  . High cholesterol   . Hypertension    DENIES   . Tendency toward bleeding easily (HCC)     Medications:  (Not in a hospital admission)  Scheduled:  . heparin  60 Units/kg Intravenous Once   Infusions:  . sodium chloride 1,000 mL (12/08/18 0924)   PRN: nitroGLYCERIN  Assessment: Pharmacy consulted to initiate heparin. No DOAC PTA noted.   Goal of Therapy:  Heparin level 0.3-0.7 units/ml Monitor platelets by anticoagulation protocol: Yes   Plan:  Give 3200 units bolus x 1 Start heparin infusion at 650 units/hr Check anti-Xa level in 8 hours and daily while on heparin Continue to monitor H&H and platelets  Oswald Hillock, PharmD, BCPS Clinical Pharmacist 12/08/2018,9:26 AM

## 2018-12-08 NOTE — ED Notes (Signed)
RN at bedside to reassess pt pain after NTG. Pt began to complain of nausea. RN handed pt emesis bag and sat pt up in case vomited and pt began agonal breathing, became unresponsive. Called for MD and megan RN to room with code cart. At time could not feel radial pulse. HOB placed back down. Pt began to move eyelids. Carotid pulse palpable. bp 77/50. Pads placed on pt.

## 2018-12-09 ENCOUNTER — Observation Stay: Payer: Medicare Other

## 2018-12-09 DIAGNOSIS — I200 Unstable angina: Secondary | ICD-10-CM | POA: Diagnosis not present

## 2018-12-09 DIAGNOSIS — R079 Chest pain, unspecified: Secondary | ICD-10-CM | POA: Diagnosis not present

## 2018-12-09 DIAGNOSIS — E0590 Thyrotoxicosis, unspecified without thyrotoxic crisis or storm: Secondary | ICD-10-CM | POA: Diagnosis not present

## 2018-12-09 DIAGNOSIS — M7989 Other specified soft tissue disorders: Secondary | ICD-10-CM | POA: Diagnosis not present

## 2018-12-09 DIAGNOSIS — E785 Hyperlipidemia, unspecified: Secondary | ICD-10-CM | POA: Diagnosis not present

## 2018-12-09 DIAGNOSIS — M79642 Pain in left hand: Secondary | ICD-10-CM | POA: Diagnosis not present

## 2018-12-09 DIAGNOSIS — I447 Left bundle-branch block, unspecified: Secondary | ICD-10-CM | POA: Diagnosis not present

## 2018-12-09 DIAGNOSIS — R0602 Shortness of breath: Secondary | ICD-10-CM | POA: Diagnosis not present

## 2018-12-09 LAB — BASIC METABOLIC PANEL
Anion gap: 6 (ref 5–15)
BUN: 13 mg/dL (ref 8–23)
CO2: 22 mmol/L (ref 22–32)
Calcium: 7.7 mg/dL — ABNORMAL LOW (ref 8.9–10.3)
Chloride: 112 mmol/L — ABNORMAL HIGH (ref 98–111)
Creatinine, Ser: 0.75 mg/dL (ref 0.44–1.00)
GFR calc Af Amer: >60 mL/min (ref >60)
GFR calc non Af Amer: >60 mL/min (ref >60)
Glucose, Bld: 101 mg/dL — ABNORMAL HIGH (ref 70–99)
Potassium: 3.1 mmol/L — ABNORMAL LOW (ref 3.5–5.1)
Sodium: 140 mmol/L (ref 135–145)

## 2018-12-09 LAB — NM MYOCAR MULTI W/SPECT W/WALL MOTION / EF
Estimated workload: 1 METS
Exercise duration (min): 1 min
Exercise duration (sec): 0 s
LV dias vol: 38 mL (ref 46–106)
LV sys vol: 7 mL
MPHR: 135 {beats}/min
Peak HR: 90 {beats}/min
Percent HR: 66 %
Rest HR: 67 {beats}/min
SDS: 0
SRS: 4
SSS: 1
TID: 1.12

## 2018-12-09 LAB — CBC
HCT: 35.1 % — ABNORMAL LOW (ref 36.0–46.0)
Hemoglobin: 11.4 g/dL — ABNORMAL LOW (ref 12.0–15.0)
MCH: 30.9 pg (ref 26.0–34.0)
MCHC: 32.5 g/dL (ref 30.0–36.0)
MCV: 95.1 fL (ref 80.0–100.0)
Platelets: 199 10*3/uL (ref 150–400)
RBC: 3.69 MIL/uL — ABNORMAL LOW (ref 3.87–5.11)
RDW: 13 % (ref 11.5–15.5)
WBC: 7.9 10*3/uL (ref 4.0–10.5)
nRBC: 0 % (ref 0.0–0.2)

## 2018-12-09 LAB — MAGNESIUM: Magnesium: 1.9 mg/dL (ref 1.7–2.4)

## 2018-12-09 MED ORDER — CEPHALEXIN 500 MG PO CAPS
500.0000 mg | ORAL_CAPSULE | Freq: Two times a day (BID) | ORAL | Status: DC
  Administered 2018-12-09: 500 mg via ORAL
  Filled 2018-12-09: qty 1

## 2018-12-09 MED ORDER — CEPHALEXIN 500 MG PO CAPS: 500.0000 mg | ORAL_CAPSULE | Freq: Three times a day (TID) | ORAL | 0 refills | Status: DC

## 2018-12-09 MED ORDER — TECHNETIUM TC 99M TETROFOSMIN IV KIT
10.6000 | PACK | Freq: Once | INTRAVENOUS | Status: AC | PRN
  Administered 2018-12-09: 10.6 via INTRAVENOUS

## 2018-12-09 MED ORDER — OXYCODONE-ACETAMINOPHEN 5-325 MG PO TABS: 1.0000 | ORAL_TABLET | ORAL | 0 refills | Status: DC | PRN

## 2018-12-09 MED ORDER — REGADENOSON 0.4 MG/5ML IV SOLN
0.4000 mg | Freq: Once | INTRAVENOUS | Status: AC
  Administered 2018-12-09: 0.4 mg via INTRAVENOUS

## 2018-12-09 MED ORDER — TECHNETIUM TC 99M TETROFOSMIN IV KIT
30.0000 | PACK | Freq: Once | INTRAVENOUS | Status: AC | PRN
  Administered 2018-12-09: 30.61 via INTRAVENOUS

## 2018-12-09 MED ORDER — DIPHENHYDRAMINE HCL 12.5 MG/5ML PO ELIX
12.5000 mg | ORAL_SOLUTION | Freq: Once | ORAL | Status: AC
  Administered 2018-12-09: 12.5 mg via ORAL
  Filled 2018-12-09: qty 5

## 2018-12-09 MED ORDER — CEPHALEXIN 500 MG PO CAPS: 500.0000 mg | ORAL_CAPSULE | Freq: Two times a day (BID) | ORAL | 0 refills | Status: AC

## 2018-12-09 MED ORDER — CEPHALEXIN 500 MG PO CAPS: 500.0000 mg | ORAL_CAPSULE | Freq: Three times a day (TID) | ORAL | Status: DC

## 2018-12-09 MED ORDER — POTASSIUM CHLORIDE CRYS ER 20 MEQ PO TBCR
40.0000 meq | EXTENDED_RELEASE_TABLET | Freq: Two times a day (BID) | ORAL | Status: DC
  Administered 2018-12-09: 40 meq via ORAL
  Filled 2018-12-09: qty 2

## 2018-12-09 NOTE — Plan of Care (Signed)
No voiced complaints of chest pain or SOB.  C/O left arm pain at IV insertion site, eased with Tylenol.  No changes noted on telemetry.

## 2018-12-09 NOTE — Care Management Obs Status (Signed)
[place] NOTIFICATION   Patient Details  Name: Emily Wu MRN: [id number] Date of Birth: 07-May-1931   Medicare Observation Status Notification Given:  Yes    Elza Rafter, RN 12/09/2018, 4:07 PM

## 2018-12-09 NOTE — Progress Notes (Signed)
Discharge instructions explained to pt and pts daughter/ verbalized an understanding / iv and tele removed/ transported off unit via wheelchair.  

## 2018-12-09 NOTE — Procedures (Signed)
The patient has had some significant symptoms of left arm discomfort and occasional chest discomfort without evidence of myocardial infarction  Axis scan infusion Myoview has shown abnormal baseline EKG of left bundle branch block but no changes during Lexiscan infusion  Myocardial perfusion is normal both at rest and stress without evidence of myocardial ischemia  No further intervention at this time and may be okay for discharged home if ambulating well without further significant symptoms.  Although patient may need further follow-up and assessment and medication management thereafter

## 2018-12-10 NOTE — Discharge Summary (Signed)
[facility] - [place] at [facility], 85 y.o., DOB October 31, 1931, MRN [id number]. Admission date: 12/08/2018 Discharge Date 12/10/2018 Primary MD Crecencio Mc, MD Admitting Physician Dustin Flock, MD  Admission Diagnosis  Shortness of breath [R06.02] Unstable angina ([place]) [I20.0] Left arm pain [M79.602]  Discharge Diagnosis   Active Problems: Chest pain noncardiac status post stress test Left arm numbness and swelling possible due to cellulitis Hyperlipidemia Hypothyroidism GERD   Hospital Course Emily Wu  is a 85 y.o. female with a known history of osteoarthritis, hyperlipidemia, hypertension who is presenting to the emergency room with left arm pain.    Patient was evaluated with a CT per PE protocol which is negative.  Cardiac enzymes were negative.  Patient was seen by cardiology and underwent a stress test which was negative.  Patient was complaining of pain in the left arm.  And had swelling.  Arteriogram of the arm was negative.  She will be treated for cellulitis and follow-up with her cardiologist outpatient.  Patient's recent echo was negative as well.  Did not show any significant valvular dysfunction.           Consults  cardiology  Significant Tests:  See full reports for all details     Dg Chest 2 View  Result Date: 12/08/2018 CLINICAL DATA:  Chest pain.  Acute left shoulder and arm pain. EXAM: CHEST - 2 VIEW COMPARISON:  Radiographs dated 12/02/2018 and 02/05/2014 FINDINGS: The heart size and mediastinal contours are within normal limits. Both lungs are clear. No acute bone abnormality. IMPRESSION: No active cardiopulmonary disease. Electronically Signed   By: Lorriane Shire M.D.   On: 12/08/2018 09:13   Dg Chest 2 View  Result Date: 12/02/2018 CLINICAL DATA:  Shortness of breath on exertion EXAM: CHEST - 2 VIEW COMPARISON:  11/06/2016 FINDINGS: Cardiac shadows within normal limits. The lungs are well aerated bilaterally.  Bilateral breast implants are noted. No focal infiltrate or sizable effusion is seen. No bony abnormality is noted. IMPRESSION: No active cardiopulmonary disease. Electronically Signed   By: Inez Catalina M.D.   On: 12/02/2018 19:34   Ct Angio Chest Pe W Or Wo Contrast  Result Date: 12/08/2018 CLINICAL DATA:  Hypertension and shortness of breath EXAM: CT ANGIOGRAPHY CHEST WITH CONTRAST TECHNIQUE: Multidetector CT imaging of the chest was performed using the standard protocol during bolus administration of intravenous contrast. Multiplanar CT image reconstructions and MIPs were obtained to evaluate the vascular anatomy. CONTRAST:  46mL OMNIPAQUE IOHEXOL 350 MG/ML SOLN COMPARISON:  Plain film from earlier in the same day FINDINGS: Cardiovascular: Thoracic aorta shows mild atherosclerotic change without aneurysmal dilatation or dissection. No cardiac enlargement is seen. Mild coronary calcifications are noted. The pulmonary artery shows a normal branching pattern without intraluminal filling defect to suggest pulmonary embolism. Mediastinum/Nodes: Thoracic inlet is within normal limits. No sizable hilar or mediastinal adenopathy is noted. The esophagus is within normal limits. Lungs/Pleura: The lungs are well aerated bilaterally. No focal infiltrate or sizable effusion is noted. 5 mm nodule is noted in the left upper lobe best seen on image number 16 of series 6. No other sizable nodule is seen. Upper Abdomen: Visualized upper abdomen is within normal limits. Musculoskeletal: Bilateral breast implants are noted. No definitive rupture is seen. The bony structures show no degenerative change of the thoracic spine. No acute compression deformity is noted. Review of the MIP images confirms the above findings. IMPRESSION: No evidence of pulmonary emboli. 5 mm nodule in the  left upper lobe as described. No follow-up needed if patient is low-risk. Non-contrast chest CT can be considered in 12 months if patient is  high-risk. This recommendation follows the consensus statement: Guidelines for Management of Incidental Pulmonary Nodules Detected on CT Images: From the Fleischner Society 2017; Radiology 2017; 284:228-243. No other focal abnormality is noted. Aortic Atherosclerosis (ICD10-I70.0). Electronically Signed   By: Inez Catalina M.D.   On: 12/08/2018 10:41   Nm Myocar Multi W/spect W/wall Motion / Ef  Result Date: 12/09/2018  The study is normal.  This is a low risk study.  The left ventricular ejection fraction is mildly decreased (45-54%).  Blood pressure demonstrated a normal response to exercise.  There was no ST segment deviation noted during stress.    [place] Upper Extremity Duplex Left (non-wbi)  Result Date: 12/09/2018 CLINICAL DATA:  85 year old with pain and swelling in the left wrist and hand. EXAM: LEFT UPPER EXTREMITY ARTERIAL DUPLEX SCAN TECHNIQUE: Gray-scale sonography as well as color Doppler and duplex ultrasound was performed to evaluate the arteries of the upper extremity. COMPARISON:  None. FINDINGS: Left arm blood pressure is 157/69. Right arm blood pressure is 160/70. Left subclavian artery, left axillary artery, left brachial artery, left radial artery and left ulnar arteries are patent. Normal waveforms and velocities in left upper extremity arteries. Right subclavian artery is patent with normal velocity and waveforms. IMPRESSION: Normal left upper extremity arterial duplex examination. Left upper extremity arteries are patent without significant stenosis. Symmetric arm blood pressures. Electronically Signed   By: Markus Daft M.D.   On: 12/09/2018 17:08       Today   Subjective:   Emily Wu   left upper extremity swelling and pain  Objective:   Blood pressure 122/87, pulse 67, temperature 98.1 F (36.7 C), temperature source Oral, resp. rate 18, height 5' 2.5" (1.588 m), weight 56.6 kg, SpO2 100 %.  . No intake or output data in the 24 hours ending 12/10/18  1537  Exam VITAL SIGNS: Blood pressure 122/87, pulse 67, temperature 98.1 F (36.7 C), temperature source Oral, resp. rate 18, height 5' 2.5" (1.588 m), weight 56.6 kg, SpO2 100 %.  GENERAL:  85 y.o.-year-old patient lying in the bed with no acute distress.  EYES: Pupils equal, round, reactive to light and accommodation. No scleral icterus. Extraocular muscles intact.  HEENT: Head atraumatic, normocephalic. Oropharynx and nasopharynx clear.  NECK:  Supple, no jugular venous distention. No thyroid enlargement, no tenderness.  LUNGS: Normal breath sounds bilaterally, no wheezing, rales,rhonchi or crepitation. No use of accessory muscles of respiration.  CARDIOVASCULAR: S1, S2 normal. No murmurs, rubs, or gallops.  ABDOMEN: Soft, nontender, nondistended. Bowel sounds present. No organomegaly or mass.  EXTREMITIES: No pedal edema, cyanosis, or clubbing.  NEUROLOGIC: Cranial nerves II through XII are intact. Muscle strength 5/5 in all extremities. Sensation intact. Gait not checked.  PSYCHIATRIC: The patient is alert and oriented x 3.  SKIN: No obvious rash, lesion, or ulcer.   Data Review     CBC w Diff:  Lab Results  Component Value Date   WBC 7.9 12/09/2018   HGB 11.4 (L) 12/09/2018   HCT 35.1 (L) 12/09/2018   PLT 199 12/09/2018   LYMPHOPCT 37.4 04/15/2017   MONOPCT 9.4 04/15/2017   EOSPCT 2.2 04/15/2017   BASOPCT 0.8 04/15/2017   CMP:  Lab Results  Component Value Date   NA 140 12/09/2018   K 3.1 (L) 12/09/2018   CL 112 (H) 12/09/2018   CO2 22  12/09/2018   BUN 13 12/09/2018   CREATININE 0.75 12/09/2018   CREATININE 0.92 03/09/2012   PROT 6.6 09/28/2018   ALBUMIN 3.9 09/28/2018   BILITOT 0.3 09/28/2018   ALKPHOS 55 09/28/2018   AST 21 09/28/2018   ALT 16 09/28/2018  .  Micro Results No results found for this or any previous visit (from the past 240 hour(s)).   Code Status History    Date Active Date Inactive Code Status Order ID Comments User Context    12/08/2018 1156 12/09/2018 2206 Full Code [id number]  Dustin Flock, MD ED   02/03/2014 1639 02/04/2014 1638 Full Code [id number]  Melton Alar, PA-C Inpatient    Advance Directive Documentation     Most Recent Value  Type of Advance Directive  Healthcare Power of Attorney, Living will  Pre-existing out of facility DNR order (yellow form or pink MOST form)  -  "MOST" Form in Place?  -          Follow-up Information    Crecencio Mc, MD Follow up in 6 day(s).   Specialty:  Internal Medicine Contact information: [address] [place] [address] [phone]        Wilhelmina Mcardle, MD Follow up in 2 week(s).   Specialty:  Pulmonary Disease Contact information: [place] [address] [phone]           Discharge Medications   Allergies as of 12/09/2018      Reactions   Tramadol Anaphylaxis, Other (See Comments)   Lowered Blood Pressure   Amoxicillin Rash   Promethazine Hcl Rash   Sulfa Drugs Cross Reactors Rash      Medication List    TAKE these medications   acetaminophen 650 MG CR tablet Commonly known as:  TYLENOL Take 650 mg by mouth every 8 (eight) hours as needed for pain.   amitriptyline 25 MG tablet Commonly known as:  ELAVIL TAKE ONE TABLET EVERY DAY What changed:  when to take this   atorvastatin 20 MG tablet Commonly known as:  LIPITOR TAKE 1 TABLET BY MOUTH DAILY What changed:  when to take this   camphor-menthol lotion Commonly known as:  SARNA Apply 1 application topically daily as needed for itching.   cephALEXin 500 MG capsule Commonly known as:  KEFLEX Take 1 capsule (500 mg total) by mouth 2 (two) times daily for 5 days.   diclofenac sodium 1 % Gel Commonly known as:  VOLTAREN APPLY 2 GRAMS TOPICALLY FOUR TIMES DAILY What changed:  See the new instructions.   estradiol 0.1 MG/24HR patch Commonly known as:  VIVELLE-DOT PLACE 1 PATCH ONTO SKIN TWICE A WEEK What changed:   See the new instructions.   liothyronine 5 MCG tablet Commonly known as:  CYTOMEL TAKE TWO TABLETS EVERY DAY What changed:  See the new instructions.   meloxicam 7.5 MG tablet Commonly known as:  MOBIC TAKE 1 TABLET BY MOUTH DAILY AS NEEDED FOR JOINT PAIN What changed:  See the new instructions.   multivitamin tablet Take 1 tablet by mouth daily.   oxyCODONE-acetaminophen 5-325 MG tablet Commonly known as:  PERCOCET Take 1 tablet by mouth every 4 (four) hours as needed for severe pain.   VITAMIN B-12 PO Take 1 tablet by mouth daily.          Total Time in preparing paper work, data evaluation and todays exam - 63 minutes  Dustin Flock M.D on 12/10/2018 at 3:37 PM [facility]   Office  [phone]

## 2018-12-15 ENCOUNTER — Other Ambulatory Visit: Payer: Self-pay | Admitting: Internal Medicine

## 2018-12-24 ENCOUNTER — Telehealth: Payer: Self-pay | Admitting: Internal Medicine

## 2018-12-24 ENCOUNTER — Ambulatory Visit (INDEPENDENT_AMBULATORY_CARE_PROVIDER_SITE_OTHER): Payer: Medicare Other | Admitting: Internal Medicine

## 2018-12-24 ENCOUNTER — Encounter: Payer: Self-pay | Admitting: Internal Medicine

## 2018-12-24 VITALS — BP 110/52 | HR 75 | Temp 97.6°F | Resp 16 | Ht 62.5 in | Wt 123.4 lb

## 2018-12-24 DIAGNOSIS — E876 Hypokalemia: Secondary | ICD-10-CM | POA: Diagnosis not present

## 2018-12-24 DIAGNOSIS — Z09 Encounter for follow-up examination after completed treatment for conditions other than malignant neoplasm: Secondary | ICD-10-CM

## 2018-12-24 DIAGNOSIS — F413 Other mixed anxiety disorders: Secondary | ICD-10-CM

## 2018-12-24 DIAGNOSIS — Z01818 Encounter for other preprocedural examination: Secondary | ICD-10-CM

## 2018-12-24 DIAGNOSIS — I447 Left bundle-branch block, unspecified: Secondary | ICD-10-CM

## 2018-12-24 DIAGNOSIS — R911 Solitary pulmonary nodule: Secondary | ICD-10-CM

## 2018-12-24 DIAGNOSIS — R944 Abnormal results of kidney function studies: Secondary | ICD-10-CM | POA: Diagnosis not present

## 2018-12-24 LAB — BASIC METABOLIC PANEL
BUN: 19 mg/dL (ref 6–23)
CO2: 29 mEq/L (ref 19–32)
Calcium: 9.6 mg/dL (ref 8.4–10.5)
Chloride: 101 mEq/L (ref 96–112)
Creatinine, Ser: 1.06 mg/dL (ref 0.40–1.20)
GFR: 49.2 mL/min — ABNORMAL LOW (ref >60.00)
Glucose, Bld: 79 mg/dL (ref 70–99)
Potassium: 4.4 mEq/L (ref 3.5–5.1)
Sodium: 137 mEq/L (ref 135–145)

## 2018-12-24 LAB — MAGNESIUM: Magnesium: 2.2 mg/dL (ref 1.5–2.5)

## 2018-12-24 MED ORDER — ESCITALOPRAM OXALATE 5 MG PO TABS: 5.0000 mg | ORAL_TABLET | Freq: Every day | ORAL | 0 refills | Status: DC

## 2018-12-24 MED ORDER — DICLOFENAC SODIUM 1 % TD GEL: 2.0000 g | Freq: Four times a day (QID) | TRANSDERMAL | 11 refills | Status: DC | PRN

## 2018-12-24 NOTE — Telephone Encounter (Signed)
Copied from [id number] [id number]. Topic: Quick Communication - Rx Refill/Question >> Dec 24, 2018  2:11 PM Margot Ables wrote: Medication: pt called stating the name of the medication she discussed with Dr. Derrel Nip today is OXALATE 10mg  - she reports taking 1/2 pill at night/bedtime but the RX is for 1 pill at night/bedtime. Pt has 10 pills left. She said Dr. Derrel Nip needed the name of the medicine.  Has the patient contacted their pharmacy? No - saw Dr. Derrel Nip today Preferred Pharmacy (with phone number or street name): [place], [place] - [place] [phone] (Phone) [phone] (Fax)

## 2018-12-24 NOTE — Progress Notes (Signed)
Subjective:  Patient ID: Emily Wu, female    DOB: 06-23-1931  Age: 85 y.o. MRN: [id number]  CC: The primary encounter diagnosis was Hypokalemia. Diagnoses of Other mixed anxiety disorders, Left bundle branch block (LBBB) determined by electrocardiography, Preoperative evaluation of a medical condition to rule out surgical contraindications (TAR required), Hospital discharge follow-up, Incidental pulmonary nodule, > 9mm and < 27mm, and Decreased GFR were also pertinent to this visit.  HPI Emily Wu presents for hospital follow up.  Patient was admitted on  January 15 To [facility] with  left arm of uncertain etiology . She was admitted with a diagnosis of unstable angina,  Given aspirin x 4 plus sublingual NTG In th ER which caused severe hypotension  .  She was evaluated with CTA angiogram to rule out PE.  Cardiac enzymes,  And stress test were  negative for ischemia, and an ECHO  was negative for valvular dysfunction. Doppler study of  left arm was done due to  Rule out DVT due to c/o persistent left arm pain.   She was treated for cellulitis  And discharged to home on January 17 .   the following medications were newly prescribed.  Keflex 500 mg bid x 5 days She has been sleeping better since starting lexapro 5 mg dose  Using meloxicam 7.5 mg daily for knee pain .    CT angiogram of chest reviewed:  Incidental  finding of a 5 mm nodule in LUL noted   Breast implants.  Mild coronary calcifications  Wants to proceed with her left knee surgery by [facility].    Outpatient Medications Prior to Visit  Medication Sig Dispense Refill  . acetaminophen (TYLENOL) 650 MG CR tablet Take 650 mg by mouth every 8 (eight) hours as needed for pain.     Marland Kitchen amitriptyline (ELAVIL) 25 MG tablet TAKE 1 TABLET BY MOUTH DAILY 90 tablet 1  . atorvastatin (LIPITOR) 20 MG tablet TAKE 1 TABLET BY MOUTH DAILY (Patient taking differently: Take 20 mg by mouth at bedtime. ) 90 tablet 1  . camphor-menthol  (SARNA) lotion Apply 1 application topically daily as needed for itching.    . Cyanocobalamin (VITAMIN B-12 PO) Take 1 tablet by mouth daily.    Marland Kitchen estradiol (VIVELLE-DOT) 0.1 MG/24HR patch PLACE 1 PATCH ONTO SKIN TWICE A WEEK (Patient taking differently: Place 1 patch onto the skin 2 (two) times a week. ) 8 patch 0  . liothyronine (CYTOMEL) 5 MCG tablet TAKE TWO TABLETS EVERY DAY (Patient taking differently: Take 10 mcg by mouth daily. ) 180 tablet 0  . meloxicam (MOBIC) 7.5 MG tablet TAKE 1 TABLET BY MOUTH DAILY AS NEEDED FOR JOINT PAIN (Patient taking differently: Take 7.5 mg by mouth daily. ) 90 tablet 1  . Multiple Vitamin (MULTIVITAMIN) tablet Take 1 tablet by mouth daily.    . diclofenac sodium (VOLTAREN) 1 % GEL APPLY 2 GRAMS TOPICALLY FOUR TIMES DAILY (Patient taking differently: Apply 2 g topically 4 (four) times daily as needed (for knee pain). ) 100 g 2  . oxyCODONE-acetaminophen (PERCOCET) 5-325 MG tablet Take 1 tablet by mouth every 4 (four) hours as needed for severe pain. (Patient not taking: Reported on 12/24/2018) 10 tablet 0   No facility-administered medications prior to visit.     Review of Systems;  Patient denies headache, fevers, malaise, unintentional weight loss, skin rash, eye pain, sinus congestion and sinus pain, sore throat, dysphagia,  hemoptysis , cough, dyspnea, wheezing, chest pain, palpitations,  orthopnea, edema, abdominal pain, nausea, melena, diarrhea, constipation, flank pain, dysuria, hematuria, urinary  Frequency, nocturia, numbness, tingling, seizures,  Focal weakness, Loss of consciousness,  Tremor, insomnia, depression, anxiety, and suicidal ideation.      Objective:  BP (!) 110/52 (BP Location: Left Arm, Patient Position: Sitting, Cuff Size: Normal)   Pulse 75   Temp 97.6 F (36.4 C) (Oral)   Resp 16   Ht 5' 2.5" (1.588 m)   Wt 123 lb 6.4 oz (56 kg)   SpO2 98%   BMI 22.21 kg/m   BP Readings from Last 3 Encounters:  12/24/18 (!) 110/52    12/09/18 122/87  12/02/18 128/70    Wt Readings from Last 3 Encounters:  12/24/18 123 lb 6.4 oz (56 kg)  12/08/18 124 lb 11.2 oz (56.6 kg)  12/02/18 123 lb 6.4 oz (56 kg)    General appearance: alert, cooperative and appears stated age Ears: normal TM's and external ear canals both ears Throat: lips, mucosa, and tongue normal; teeth and gums normal Neck: no adenopathy, no carotid bruit, supple, symmetrical, trachea midline and thyroid not enlarged, symmetric, no tenderness/mass/nodules Back: symmetric, no curvature. ROM normal. No CVA tenderness. Lungs: clear to auscultation bilaterally Heart: regular rate and rhythm, S1, S2 normal, no murmur, click, rub or gallop Abdomen: soft, non-tender; bowel sounds normal; no masses,  no organomegaly Pulses: 2+ and symmetric Skin: Skin color, texture, turgor normal. No rashes or lesions Lymph nodes: Cervical, supraclavicular, and axillary nodes normal.  No results found for: HGBA1C  Lab Results  Component Value Date   CREATININE 1.06 12/24/2018   CREATININE 0.75 12/09/2018   CREATININE 0.87 12/08/2018    Lab Results  Component Value Date   WBC 7.9 12/09/2018   HGB 11.4 (L) 12/09/2018   HCT 35.1 (L) 12/09/2018   PLT 199 12/09/2018   GLUCOSE 79 12/24/2018   CHOL 143 09/28/2018   TRIG 268.0 (H) 09/28/2018   HDL 30.30 (L) 09/28/2018   LDLDIRECT 72.0 09/28/2018   LDLCALC 73 03/19/2018   ALT 16 09/28/2018   AST 21 09/28/2018   NA 137 12/24/2018   K 4.4 12/24/2018   CL 101 12/24/2018   CREATININE 1.06 12/24/2018   BUN 19 12/24/2018   CO2 29 12/24/2018   TSH 1.349 12/08/2018   INR 1.04 12/08/2018    Dg Chest 2 View  Result Date: 12/08/2018 CLINICAL DATA:  Chest pain.  Acute left shoulder and arm pain. EXAM: CHEST - 2 VIEW COMPARISON:  Radiographs dated 12/02/2018 and 02/05/2014 FINDINGS: The heart size and mediastinal contours are within normal limits. Both lungs are clear. No acute bone abnormality. IMPRESSION: No active  cardiopulmonary disease. Electronically Signed   By: Lorriane Shire M.D.   On: 12/08/2018 09:13   Ct Angio Chest Pe W Or Wo Contrast  Result Date: 12/08/2018 CLINICAL DATA:  Hypertension and shortness of breath EXAM: CT ANGIOGRAPHY CHEST WITH CONTRAST TECHNIQUE: Multidetector CT imaging of the chest was performed using the standard protocol during bolus administration of intravenous contrast. Multiplanar CT image reconstructions and MIPs were obtained to evaluate the vascular anatomy. CONTRAST:  46mL OMNIPAQUE IOHEXOL 350 MG/ML SOLN COMPARISON:  Plain film from earlier in the same day FINDINGS: Cardiovascular: Thoracic aorta shows mild atherosclerotic change without aneurysmal dilatation or dissection. No cardiac enlargement is seen. Mild coronary calcifications are noted. The pulmonary artery shows a normal branching pattern without intraluminal filling defect to suggest pulmonary embolism. Mediastinum/Nodes: Thoracic inlet is within normal limits. No sizable hilar or mediastinal adenopathy  is noted. The esophagus is within normal limits. Lungs/Pleura: The lungs are well aerated bilaterally. No focal infiltrate or sizable effusion is noted. 5 mm nodule is noted in the left upper lobe best seen on image number 16 of series 6. No other sizable nodule is seen. Upper Abdomen: Visualized upper abdomen is within normal limits. Musculoskeletal: Bilateral breast implants are noted. No definitive rupture is seen. The bony structures show no degenerative change of the thoracic spine. No acute compression deformity is noted. Review of the MIP images confirms the above findings. IMPRESSION: No evidence of pulmonary emboli. 5 mm nodule in the left upper lobe as described. No follow-up needed if patient is low-risk. Non-contrast chest CT can be considered in 12 months if patient is high-risk. This recommendation follows the consensus statement: Guidelines for Management of Incidental Pulmonary Nodules Detected on CT Images:  From the Fleischner Society 2017; Radiology 2017; 284:228-243. No other focal abnormality is noted. Aortic Atherosclerosis (ICD10-I70.0). Electronically Signed   By: Inez Catalina M.D.   On: 12/08/2018 10:41   Nm Myocar Multi W/spect W/wall Motion / Ef  Result Date: 12/09/2018  The study is normal.  This is a low risk study.  The left ventricular ejection fraction is mildly decreased (45-54%).  Blood pressure demonstrated a normal response to exercise.  There was no ST segment deviation noted during stress.    [place] Upper Extremity Duplex Left (non-wbi)  Result Date: 12/09/2018 CLINICAL DATA:  85 year old with pain and swelling in the left wrist and hand. EXAM: LEFT UPPER EXTREMITY ARTERIAL DUPLEX SCAN TECHNIQUE: Gray-scale sonography as well as color Doppler and duplex ultrasound was performed to evaluate the arteries of the upper extremity. COMPARISON:  None. FINDINGS: Left arm blood pressure is 157/69. Right arm blood pressure is 160/70. Left subclavian artery, left axillary artery, left brachial artery, left radial artery and left ulnar arteries are patent. Normal waveforms and velocities in left upper extremity arteries. Right subclavian artery is patent with normal velocity and waveforms. IMPRESSION: Normal left upper extremity arterial duplex examination. Left upper extremity arteries are patent without significant stenosis. Symmetric arm blood pressures. Electronically Signed   By: Markus Daft M.D.   On: 12/09/2018 17:08    Assessment & Plan:   Problem List Items Addressed This Visit    Anxiety disorder    Aggravated by husband's cognitive decline. Improved with repeat trial of lexapro starting with 5 mg dose.  Refills given .        Decreased GFR    Mild, likely transient.  Takes meloxicam. However. Will repeat in one week       Relevant Orders   Basic metabolic panel   Hospital discharge follow-up    Patient is stable post discharge and has no new issues or questions about  discharge plans at the visit today for hospital follow up. All labs , imaging studies and progress notes from admission were reviewed with patient today        Incidental pulmonary nodule, > 47mm and < 72mm    She has a remote short history of tobacco abuse.  Referral to Pulmonology for follow up  On 5 mm nodule found on CT angiogram done during recent admissio n      Left bundle branch block (LBBB) determined by electrocardiography    She has had a noninvasive cardiac workup during recent admission for left arm pain which was noncardiac in nature.  Stress test and ECHO were normal      Preoperative evaluation  of a medical condition to rule out surgical contraindications (TAR required)    She is now medically cleared for knee surgery given her recent non invasive ischemia  workup  during recent hospitalization for left arm pain        Other Visit Diagnoses    Hypokalemia    -  Primary   Relevant Orders   Basic metabolic panel (Completed)   Magnesium (Completed)      I have discontinued Jerene Pitch. Barcellos's oxyCODONE-acetaminophen. I have also changed her diclofenac sodium. Additionally, I am having her maintain her multivitamin, acetaminophen, liothyronine, atorvastatin, meloxicam, estradiol, Cyanocobalamin (VITAMIN B-12 PO), camphor-menthol, and amitriptyline.  Meds ordered this encounter  Medications  . diclofenac sodium (VOLTAREN) 1 % GEL    Sig: Apply 2 g topically 4 (four) times daily as needed (for knee pain).    Dispense:  200 g    Refill:  11    Medications Discontinued During This Encounter  Medication Reason  . oxyCODONE-acetaminophen (PERCOCET) 5-325 MG tablet Prescription never filled  . diclofenac sodium (VOLTAREN) 1 % GEL Reorder    Follow-up: No follow-ups on file.   Crecencio Mc, MD

## 2018-12-24 NOTE — Telephone Encounter (Signed)
I have refilled your escitalopram oxalate (generic for Lexapro)  For 90 days,  At the 5 mg dose,  so you do not need to break the new pills in half.   Regar

## 2018-12-24 NOTE — Telephone Encounter (Signed)
Patient notified

## 2018-12-24 NOTE — Patient Instructions (Addendum)
You can continue mobic once daily and use the  voltaren (diclofenac)  cream not more than 2 times daily   Call us back with the name of the sleeping pill    You can add up to 2000 mg of acetominophen (tylenol) every day safely  In divided doses ( 650 mg every 8 hours )  ALONG WITH YOUR DAILY MELOXICAM   I WILL CONTACT DR Thea Alken AND CLEAR YOU FOR SURGERY

## 2018-12-26 DIAGNOSIS — R944 Abnormal results of kidney function studies: Secondary | ICD-10-CM | POA: Insufficient documentation

## 2018-12-26 DIAGNOSIS — Z09 Encounter for follow-up examination after completed treatment for conditions other than malignant neoplasm: Secondary | ICD-10-CM | POA: Insufficient documentation

## 2018-12-26 DIAGNOSIS — N183 Chronic kidney disease, stage 3 unspecified: Secondary | ICD-10-CM | POA: Insufficient documentation

## 2018-12-26 DIAGNOSIS — R911 Solitary pulmonary nodule: Secondary | ICD-10-CM | POA: Insufficient documentation

## 2018-12-26 NOTE — Assessment & Plan Note (Signed)
She has a remote short history of tobacco abuse.  Referral to Pulmonology for follow up  On 5 mm nodule found on CT angiogram done during recent admissio n

## 2018-12-26 NOTE — Assessment & Plan Note (Signed)
Patient is stable post discharge and has no new issues or questions about discharge plans at the visit today for hospital follow up. All labs , imaging studies and progress notes from admission were reviewed with patient today   

## 2018-12-26 NOTE — Assessment & Plan Note (Signed)
She has had a noninvasive cardiac workup during recent admission for left arm pain which was noncardiac in nature.  Stress test and ECHO were normal

## 2018-12-26 NOTE — Assessment & Plan Note (Signed)
Mild, likely transient.  Takes meloxicam. However. Will repeat in one week

## 2018-12-26 NOTE — Assessment & Plan Note (Signed)
Aggravated by husband's cognitive decline. Improved with repeat trial of lexapro starting with 5 mg dose.  Refills given .

## 2018-12-26 NOTE — Assessment & Plan Note (Signed)
She is now medically cleared for knee surgery given her recent non invasive ischemia  workup  during recent hospitalization for left arm pain

## 2018-12-27 ENCOUNTER — Telehealth: Payer: Self-pay | Admitting: *Deleted

## 2018-12-27 ENCOUNTER — Other Ambulatory Visit: Payer: Self-pay | Admitting: Internal Medicine

## 2018-12-27 NOTE — Telephone Encounter (Signed)
Copied from [place] [id number]. Topic: General - Other >> Dec 27, 2018  9:12 AM Yvette Rack wrote: Reason for CRM: pt calling stating that Dr Derrel Nip had cleared her for surgery and that Dr Marchia Bond  [facility]) [phone] ([phone] hasn't received any letter stating that she can have surgery surgery is setup for Tuesday Feb 25th

## 2018-12-27 NOTE — Telephone Encounter (Signed)
Message sent to [place] over weekend   Letter drafted but will not print !!!!

## 2018-12-27 NOTE — Telephone Encounter (Signed)
Pt returned call. I made pt aware of message below.

## 2018-12-27 NOTE — Progress Notes (Signed)
Letter drafted;  Will not print from current location despite multipole attempts

## 2018-12-27 NOTE — Telephone Encounter (Signed)
Letter has been printed and faxed.

## 2019-01-03 ENCOUNTER — Other Ambulatory Visit: Payer: Self-pay | Admitting: Internal Medicine

## 2019-01-04 ENCOUNTER — Ambulatory Visit (INDEPENDENT_AMBULATORY_CARE_PROVIDER_SITE_OTHER): Payer: Medicare Other | Admitting: Internal Medicine

## 2019-01-04 ENCOUNTER — Encounter: Payer: Self-pay | Admitting: Internal Medicine

## 2019-01-04 DIAGNOSIS — G8929 Other chronic pain: Secondary | ICD-10-CM

## 2019-01-04 DIAGNOSIS — I200 Unstable angina: Secondary | ICD-10-CM | POA: Diagnosis not present

## 2019-01-04 DIAGNOSIS — M25562 Pain in left knee: Secondary | ICD-10-CM | POA: Diagnosis not present

## 2019-01-04 NOTE — Progress Notes (Signed)
Subjective:  Patient ID: Emily Wu, female    DOB: 04-May-1931  Age: 85 y.o. MRN: [id number]  CC: The encounter diagnosis was Chronic pain of left knee.  HPI SAORI UMHOLTZ presents for discussion of the risks of her upcoming surgery, the discussion requested by her daughters who are concerned that she has made the decision lightly and has not fully considered the risk of the surgery and the potential for a complicated recovery .  Per her daughter [name], she is concerned that her mother's low threshold for pain may complicate her ability to participate in the physical therapy and rehab post operatively.    I have discussed these concerns with patient.  She is currently in constant pain despite daily use of NSAIDS and non narcotic analgesics.  She is tired of being in pain and feels that her pain is limiting her ability to lead an active life.  Although her husband has had some cognitive decline (per her report), he is able to drive her to appointments and prepare basic meals (soup, sandwiches) and has helped her recover from prior surgeries.  I have recommended that she request home PT from her orthopedist to help her rehab. I have also advised her to aggressively treat her pain PRIOR to her PT sessions so that she may fully participate.  Outpatient Medications Prior to Visit  Medication Sig Dispense Refill  . acetaminophen (TYLENOL) 650 MG CR tablet Take 650 mg by mouth every 8 (eight) hours as needed for pain.     Marland Kitchen amitriptyline (ELAVIL) 25 MG tablet TAKE 1 TABLET BY MOUTH DAILY (Patient taking differently: Take 25 mg by mouth at bedtime. ) 90 tablet 1  . atorvastatin (LIPITOR) 20 MG tablet TAKE 1 TABLET BY MOUTH DAILY (Patient taking differently: Take 20 mg by mouth at bedtime. ) 90 tablet 1  . camphor-menthol (SARNA) lotion Apply 1 application topically daily as needed for itching.    . Cyanocobalamin (VITAMIN B-12 PO) Take 1 tablet by mouth daily.    . diclofenac sodium (VOLTAREN)  1 % GEL Apply 2 g topically 4 (four) times daily as needed (for knee pain). 200 g 11  . escitalopram (LEXAPRO) 5 MG tablet Take 1 tablet (5 mg total) by mouth daily. (Patient taking differently: Take 5 mg by mouth at bedtime. ) 90 tablet 0  . estradiol (VIVELLE-DOT) 0.1 MG/24HR patch PLACE 1 PATCH ONTO SKIN TWICE A WEEK (Patient taking differently: Place 1 patch onto the skin See admin instructions. Place 1 patch onto the skin every 4 days) 8 patch 0  . liothyronine (CYTOMEL) 5 MCG tablet TAKE TWO TABLETS BY MOUTH EVERY DAY 180 tablet 0  . meloxicam (MOBIC) 7.5 MG tablet TAKE 1 TABLET BY MOUTH DAILY AS NEEDED FOR JOINT PAIN (Patient taking differently: Take 7.5 mg by mouth daily. ) 90 tablet 1  . Multiple Vitamins-Minerals (CENTRUM SILVER PO) Take 1 tablet by mouth daily.     No facility-administered medications prior to visit.     Review of Systems;  Patient denies headache, fevers, malaise, unintentional weight loss, skin rash, eye pain, sinus congestion and sinus pain, sore throat, dysphagia,  hemoptysis , cough, dyspnea, wheezing, chest pain, palpitations, orthopnea, edema, abdominal pain, nausea, melena, diarrhea, constipation, flank pain, dysuria, hematuria, urinary  Frequency, nocturia, numbness, tingling, seizures,  Focal weakness, Loss of consciousness,  Tremor, insomnia, depression, anxiety, and suicidal ideation.      Objective:  BP 130/70   Pulse 82   Temp 98.7 F (  37.1 C) (Oral)   Wt 126 lb 6.4 oz (57.3 kg)   SpO2 99%   BMI 22.75 kg/m   BP Readings from Last 3 Encounters:  01/04/19 130/70  12/24/18 (!) 110/52  12/09/18 122/87    Wt Readings from Last 3 Encounters:  01/04/19 126 lb 6.4 oz (57.3 kg)  12/24/18 123 lb 6.4 oz (56 kg)  12/08/18 124 lb 11.2 oz (56.6 kg)    General appearance: alert, cooperative and appears stated age Neck: no adenopathy, no carotid bruit, supple, symmetrical, trachea midline and thyroid not enlarged, symmetric, no  tenderness/mass/nodules Back: symmetric, no curvature. ROM normal. No CVA tenderness. Lungs: clear to auscultation bilaterally Heart: regular rate and rhythm, S1, S2 normal, no murmur, click, rub or gallop Neuro:  awake and interactive with normal mood and affect. Higher cortical functions are normal. Speech is clear without word-finding difficulty or dysarthria.  Psych: affect normal, makes good eye contact. Some fidgeting,  Which is chronic. Smiles easily.  Denies suicidal thoughts   No results found for: HGBA1C  Lab Results  Component Value Date   CREATININE 1.06 12/24/2018   CREATININE 0.75 12/09/2018   CREATININE 0.87 12/08/2018    Lab Results  Component Value Date   WBC 7.9 12/09/2018   HGB 11.4 (L) 12/09/2018   HCT 35.1 (L) 12/09/2018   PLT 199 12/09/2018   GLUCOSE 79 12/24/2018   CHOL 143 09/28/2018   TRIG 268.0 (H) 09/28/2018   HDL 30.30 (L) 09/28/2018   LDLDIRECT 72.0 09/28/2018   LDLCALC 73 03/19/2018   ALT 16 09/28/2018   AST 21 09/28/2018   NA 137 12/24/2018   K 4.4 12/24/2018   CL 101 12/24/2018   CREATININE 1.06 12/24/2018   BUN 19 12/24/2018   CO2 29 12/24/2018   TSH 1.349 12/08/2018   INR 1.04 12/08/2018    Dg Chest 2 View  Result Date: 12/08/2018 CLINICAL DATA:  Chest pain.  Acute left shoulder and arm pain. EXAM: CHEST - 2 VIEW COMPARISON:  Radiographs dated 12/02/2018 and 02/05/2014 FINDINGS: The heart size and mediastinal contours are within normal limits. Both lungs are clear. No acute bone abnormality. IMPRESSION: No active cardiopulmonary disease. Electronically Signed   By: Lorriane Shire M.D.   On: 12/08/2018 09:13   Ct Angio Chest Pe W Or Wo Contrast  Result Date: 12/08/2018 CLINICAL DATA:  Hypertension and shortness of breath EXAM: CT ANGIOGRAPHY CHEST WITH CONTRAST TECHNIQUE: Multidetector CT imaging of the chest was performed using the standard protocol during bolus administration of intravenous contrast. Multiplanar CT image reconstructions  and MIPs were obtained to evaluate the vascular anatomy. CONTRAST:  22mL OMNIPAQUE IOHEXOL 350 MG/ML SOLN COMPARISON:  Plain film from earlier in the same day FINDINGS: Cardiovascular: Thoracic aorta shows mild atherosclerotic change without aneurysmal dilatation or dissection. No cardiac enlargement is seen. Mild coronary calcifications are noted. The pulmonary artery shows a normal branching pattern without intraluminal filling defect to suggest pulmonary embolism. Mediastinum/Nodes: Thoracic inlet is within normal limits. No sizable hilar or mediastinal adenopathy is noted. The esophagus is within normal limits. Lungs/Pleura: The lungs are well aerated bilaterally. No focal infiltrate or sizable effusion is noted. 5 mm nodule is noted in the left upper lobe best seen on image number 16 of series 6. No other sizable nodule is seen. Upper Abdomen: Visualized upper abdomen is within normal limits. Musculoskeletal: Bilateral breast implants are noted. No definitive rupture is seen. The bony structures show no degenerative change of the thoracic spine. No acute compression  deformity is noted. Review of the MIP images confirms the above findings. IMPRESSION: No evidence of pulmonary emboli. 5 mm nodule in the left upper lobe as described. No follow-up needed if patient is low-risk. Non-contrast chest CT can be considered in 12 months if patient is high-risk. This recommendation follows the consensus statement: Guidelines for Management of Incidental Pulmonary Nodules Detected on CT Images: From the Fleischner Society 2017; Radiology 2017; 284:228-243. No other focal abnormality is noted. Aortic Atherosclerosis (ICD10-I70.0). Electronically Signed   By: Inez Catalina M.D.   On: 12/08/2018 10:41   Nm Myocar Multi W/spect W/wall Motion / Ef  Result Date: 12/09/2018  The study is normal.  This is a low risk study.  The left ventricular ejection fraction is mildly decreased (45-54%).  Blood pressure demonstrated a  normal response to exercise.  There was no ST segment deviation noted during stress.    [place] Upper Extremity Duplex Left (non-wbi)  Result Date: 12/09/2018 CLINICAL DATA:  85 year old with pain and swelling in the left wrist and hand. EXAM: LEFT UPPER EXTREMITY ARTERIAL DUPLEX SCAN TECHNIQUE: Gray-scale sonography as well as color Doppler and duplex ultrasound was performed to evaluate the arteries of the upper extremity. COMPARISON:  None. FINDINGS: Left arm blood pressure is 157/69. Right arm blood pressure is 160/70. Left subclavian artery, left axillary artery, left brachial artery, left radial artery and left ulnar arteries are patent. Normal waveforms and velocities in left upper extremity arteries. Right subclavian artery is patent with normal velocity and waveforms. IMPRESSION: Normal left upper extremity arterial duplex examination. Left upper extremity arteries are patent without significant stenosis. Symmetric arm blood pressures. Electronically Signed   By: Markus Daft M.D.   On: 12/09/2018 17:08    Assessment & Plan:   Problem List Items Addressed This Visit    Chronic pain of left knee    She is competent to make this decision to have a partial knee replacement.  A total of 25 minutes of face to face time was spent with patient more than half of which was spent in counselling about the above mentioned conditions  and coordination of care          I am having Jerene Pitch. Bacha maintain her acetaminophen, atorvastatin, meloxicam, estradiol, Cyanocobalamin (VITAMIN B-12 PO), camphor-menthol, amitriptyline, diclofenac sodium, escitalopram, Multiple Vitamins-Minerals (CENTRUM SILVER PO), and liothyronine.  No orders of the defined types were placed in this encounter.   There are no discontinued medications.  Follow-up: No follow-ups on file.   Crecencio Mc, MD

## 2019-01-04 NOTE — Patient Instructions (Addendum)
   Call  Dr Luanna Cole office tomorrow and make sure you will be getting home Physical Therapy  Through a home health agency for the first few weeks when  you come home    Ask one of your daughters  to handle the grocery  shopping

## 2019-01-05 NOTE — Assessment & Plan Note (Signed)
She is competent to make this decision to have a partial knee replacement.  A total of 25 minutes of face to face time was spent with patient more than half of which was spent in counselling about the above mentioned conditions  and coordination of care

## 2019-01-10 NOTE — Patient Instructions (Addendum)
Emily Wu  01/10/2019   Your procedure is scheduled on: 01-18-19   Report to [facility] Main  Entrance    Report to Admitting at 10:50 AM    Call this number if you have problems the morning of surgery [phone]    Remember: You may have a Clear Liquid Diet after Midnight until 7:20 AM. After 7:20 AM, nothing until after surgery.    CLEAR LIQUID DIET   Foods Allowed                                                                     Foods Excluded  Coffee and tea, regular and decaf                             liquids that you cannot  Plain Jell-O in any flavor                                             see through such as: Fruit ices (not with fruit pulp)                                     milk, soups, orange juice  Iced Popsicles                                    All solid food Carbonated beverages, regular and diet                                    Cranberry, grape and apple juices Sports drinks like Gatorade Lightly seasoned clear broth or consume(fat free) Sugar, honey syrup  Sample Menu Breakfast                                Lunch                                     Supper Cranberry juice                    Beef broth                            Chicken broth Jell-O                                     Grape juice                           Apple juice Coffee or tea  Jell-O                                      Popsicle                                                Coffee or tea                        Coffee or tea  _____________________________________________________________________      Take these medicines the morning of surgery with A SIP OF WATER: Cytomel (Liothyronine)   RUSH YOUR TEETH MORNING OF SURGERY AND RINSE YOUR MOUTH OUT, NO CHEWING GUM CANDY OR MINTS.                                You may not have any metal on your body including hair pins and              piercings  Do not wear jewelry,  make-up, lotions, powders or perfumes, deodorant             Do not wear nail polish.  Do not shave  48 hours prior to surgery.              Do not bring valuables to the hospital. [name].  Contacts, dentures or bridgework may not be worn into surgery.  Leave suitcase in the car. After surgery it may be brought to your room.     Patients discharged the day of surgery will not be allowed to drive home. IF YOU ARE HAVING SURGERY AND GOING HOME THE SAME DAY, YOU MUST HAVE AN ADULT TO DRIVE YOU HOME AND BE WITH YOU FOR 24 HOURS. YOU MAY GO HOME BY TAXI OR UBER OR ORTHERWISE, BUT AN ADULT MUST ACCOMPANY YOU HOME AND STAY WITH YOU FOR 24 HOURS.    Special Instructions: N/A              Please read over the following fact sheets you were given: _____________________________________________________________________             [facility] - Preparing for Surgery Before surgery, you can play an important role.  Because skin is not sterile, your skin needs to be as free of germs as possible.  You can reduce the number of germs on your skin by washing with CHG (chlorahexidine gluconate) soap before surgery.  CHG is an antiseptic cleaner which kills germs and bonds with the skin to continue killing germs even after washing. Please DO NOT use if you have an allergy to CHG or antibacterial soaps.  If your skin becomes reddened/irritated stop using the CHG and inform your nurse when you arrive at Short Stay. Do not shave (including legs and underarms) for at least 48 hours prior to the first CHG shower.  You may shave your face/neck. Please follow these instructions carefully:  1.  Shower with CHG Soap the night before surgery and the  morning of Surgery.  2.  If you choose to wash your hair, wash your hair first as usual with your  normal  shampoo.  3.  After you shampoo, rinse your hair and body thoroughly to remove the  shampoo.                            4.  Use CHG as you would any other liquid soap.  You can apply chg directly  to the skin and wash                       Gently with a scrungie or clean washcloth.  5.  Apply the CHG Soap to your body ONLY FROM THE NECK DOWN.   Do not use on face/ open                           Wound or open sores. Avoid contact with eyes, ears mouth and genitals (private parts).                       Wash face,  Genitals (private parts) with your normal soap.             6.  Wash thoroughly, paying special attention to the area where your surgery  will be performed.  7.  Thoroughly rinse your body with warm water from the neck down.  8.  DO NOT shower/wash with your normal soap after using and rinsing off  the CHG Soap.                9.  Pat yourself dry with a clean towel.            10.  Wear clean pajamas.            11.  Place clean sheets on your bed the night of your first shower and do not  sleep with pets. Day of Surgery : Do not apply any lotions/deodorants the morning of surgery.  Please wear clean clothes to the hospital/surgery center.  FAILURE TO FOLLOW THESE INSTRUCTIONS MAY RESULT IN THE CANCELLATION OF YOUR SURGERY PATIENT SIGNATURE_________________________________  NURSE SIGNATURE__________________________________  ________________________________________________________________________   Emily Wu  An incentive spirometer is a tool that can help keep your lungs clear and active. This tool measures how well you are filling your lungs with each breath. Taking long deep breaths may help reverse or decrease the chance of developing breathing (pulmonary) problems (especially infection) following:  A long period of time when you are unable to move or be active. BEFORE THE PROCEDURE   If the spirometer includes an indicator to show your best effort, your nurse or respiratory therapist will set it to a desired goal.  If possible, sit up straight or lean slightly forward. Try  not to slouch.  Hold the incentive spirometer in an upright position. INSTRUCTIONS FOR USE  1. Sit on the edge of your bed if possible, or sit up as far as you can in bed or on a chair. 2. Hold the incentive spirometer in an upright position. 3. Breathe out normally. 4. Place the mouthpiece in your mouth and seal your lips tightly around it. 5. Breathe in slowly and as deeply as possible, raising the piston or the ball toward the top of the column. 6. Hold your breath for 3-5 seconds or for as long as possible. Allow the piston or ball to fall to the bottom of the column. 7. Remove the mouthpiece from your mouth and  breathe out normally. 8. Rest for a few seconds and repeat Steps 1 through 7 at least 10 times every 1-2 hours when you are awake. Take your time and take a few normal breaths between deep breaths. 9. The spirometer may include an indicator to show your best effort. Use the indicator as a goal to work toward during each repetition. 10. After each set of 10 deep breaths, practice coughing to be sure your lungs are clear. If you have an incision (the cut made at the time of surgery), support your incision when coughing by placing a pillow or rolled up towels firmly against it. Once you are able to get out of bed, walk around indoors and cough well. You may stop using the incentive spirometer when instructed by your caregiver.  RISKS AND COMPLICATIONS  Take your time so you do not get dizzy or light-headed.  If you are in pain, you may need to take or ask for pain medication before doing incentive spirometry. It is harder to take a deep breath if you are having pain. AFTER USE  Rest and breathe slowly and easily.  It can be helpful to keep track of a log of your progress. Your caregiver can provide you with a simple table to help with this. If you are using the spirometer at home, follow these instructions: [place] IF:   You are having difficultly using the  spirometer.  You have trouble using the spirometer as often as instructed.  Your pain medication is not giving enough relief while using the spirometer.  You develop fever of 100.5 F (38.1 C) or higher. SEEK IMMEDIATE MEDICAL CARE IF:   You cough up bloody sputum that had not been present before.  You develop fever of 102 F (38.9 C) or greater.  You develop worsening pain at or near the incision site. MAKE SURE YOU:   Understand these instructions.  Will watch your condition.  Will get help right away if you are not doing well or get worse. Document Released: 03/23/2007 Document Revised: 02/02/2012 Document Reviewed: 05/24/2007 [facility] Patient Information 2014 [place], [place].   ________________________________________________________________________

## 2019-01-10 NOTE — Progress Notes (Signed)
12-24-18 (Epic) Surgical Clearance from Dr. Deborra Medina  12-13-18 (Chart Everywhere) ECHO  12-10-18 (Epic) EKG  12-09-18 (Epic) Stress Test  12-08-18 (Epic) CXR

## 2019-01-11 ENCOUNTER — Encounter (HOSPITAL_COMMUNITY): Payer: Self-pay

## 2019-01-11 ENCOUNTER — Other Ambulatory Visit: Payer: Self-pay

## 2019-01-11 ENCOUNTER — Encounter (HOSPITAL_COMMUNITY)
Admission: RE | Admit: 2019-01-11 | Discharge: 2019-01-11 | Disposition: A | Discharge status: Home / Self Care | Payer: Medicare Other | Source: Ambulatory Visit | Attending: Orthopedic Surgery | Admitting: Orthopedic Surgery

## 2019-01-11 DIAGNOSIS — Z01812 Encounter for preprocedural laboratory examination: Secondary | ICD-10-CM | POA: Diagnosis not present

## 2019-01-11 DIAGNOSIS — M1712 Unilateral primary osteoarthritis, left knee: Secondary | ICD-10-CM | POA: Insufficient documentation

## 2019-01-11 LAB — CBC
HCT: 41.2 % (ref 36.0–46.0)
Hemoglobin: 13.2 g/dL (ref 12.0–15.0)
MCH: 31.1 pg (ref 26.0–34.0)
MCHC: 32 g/dL (ref 30.0–36.0)
MCV: 96.9 fL (ref 80.0–100.0)
Platelets: 223 10*3/uL (ref 150–400)
RBC: 4.25 MIL/uL (ref 3.87–5.11)
RDW: 13.2 % (ref 11.5–15.5)
WBC: 8.1 10*3/uL (ref 4.0–10.5)
nRBC: 0 % (ref 0.0–0.2)

## 2019-01-11 LAB — SURGICAL PCR SCREEN
MRSA, PCR: NEGATIVE
Staphylococcus aureus: NEGATIVE

## 2019-01-11 LAB — BASIC METABOLIC PANEL
Anion gap: 7 (ref 5–15)
BUN: 17 mg/dL (ref 8–23)
CO2: 27 mmol/L (ref 22–32)
Calcium: 9.3 mg/dL (ref 8.9–10.3)
Chloride: 104 mmol/L (ref 98–111)
Creatinine, Ser: 1.07 mg/dL — ABNORMAL HIGH (ref 0.44–1.00)
GFR calc Af Amer: 55 mL/min — ABNORMAL LOW (ref >60)
GFR calc non Af Amer: 47 mL/min — ABNORMAL LOW (ref >60)
Glucose, Bld: 91 mg/dL (ref 70–99)
Potassium: 5.4 mmol/L — ABNORMAL HIGH (ref 3.5–5.1)
Sodium: 138 mmol/L (ref 135–145)

## 2019-01-11 NOTE — Progress Notes (Signed)
PCP: Deborra Medina, MD  CARDIOLOGIST: N/A  INFO IN Epic:  12-24-18 (Epic) Surgical Clearance from Dr. Deborra Medina  12-13-18 (Chart Everywhere) ECHO  12-10-18 (Epic) EKG  12-09-18 (Epic) Stress Test  12-08-18 (Epic) CXR   INFO ON CHART:  BLOOD THINNERS AND LAST DOSES: ____________________________________  PATIENT SYMPTOMS AT TIME OF PREOP: N/A  Hx of L BBB

## 2019-01-13 NOTE — Progress Notes (Signed)
Anesthesia Chart Review   Case:  [id number] Date/Time:  01/18/19 1305   Procedure:  UNICOMPARTMENTAL KNEE (Left )   Anesthesia type:  Choice   Pre-op diagnosis:  djd left knee   Location:  [place] / [facility] ORS   Surgeon:  Marchia Bond, MD      DISCUSSION: 85 yo former smoker (14 pack years) with h/o HTN, left knee DJD scheduled for above procedure 01/18/19 with Dr. Marchia Bond.   Pt seen by cardiology, Dr. Bartholome Bill, on 12/02/2018.  Per his note, "Clearance pending echocardiogram results; if abnormal, will need Lexiscan prior to clearance."  Echo normal, stress test low risk study.    Pt can proceed with planned procedure barring acute status change.  VS: BP 119/64   Pulse 64   Temp 36.6 C (Oral)   Resp 16   Ht 5' 2.5" (1.588 m)   Wt 53.1 kg   SpO2 99%   BMI 21.06 kg/m   PROVIDERS: Crecencio Mc, MD is PCP   Bartholome Bill, MD is Cardiologist  LABS: Labs reviewed: Acceptable for surgery. (all labs ordered are listed, but only abnormal results are displayed)  Labs Reviewed  BASIC METABOLIC PANEL - Abnormal; Notable for the following components:      Result Value   Potassium 5.4 (*)    Creatinine, Ser 1.07 (*)    GFR calc non Af Amer 47 (*)    GFR calc Af Amer 55 (*)    All other components within normal limits  SURGICAL PCR SCREEN  CBC     IMAGES:   EKG: 12/08/2018 Rate 57 bpm Sinus rhythm  Left bundle branch block  Baseline wander in leads V2 V4  CV: Echo 12/07/2018 INTERPRETATION NORMAL LEFT VENTRICULAR SYSTOLIC FUNCTION NORMAL RIGHT VENTRICULAR SYSTOLIC FUNCTION MILD VALVULAR REGURGITATION (See above) NO VALVULAR STENOSIS MILD MR, TR, PR EF 50% Closest EF: >55% (Estimated) Mitral: MILD MR Tricuspid: MILD TR  Stress Test 12/09/2018  The study is normal.  This is a low risk study.  The left ventricular ejection fraction is mildly decreased (45-54%).  Blood pressure demonstrated a normal response to exercise.  There was no ST segment  deviation noted during stress. Past Medical History:  Diagnosis Date  . Arthritis    "knees, shoulders, chest" (02/03/2014)  . Bruises easily   . Cervical cancer ([place]) 1965  . High cholesterol   . Hypertension    DENIES   . Tendency toward bleeding easily [facility])     Past Surgical History:  Procedure Laterality Date  . ABDOMINAL HYSTERECTOMY  1965  . APPENDECTOMY    . CHOLECYSTECTOMY    . KNEE ARTHROSCOPY Left    [place]  . SHOULDER OPEN ROTATOR CUFF REPAIR Bilateral 2007,  2010   [place]  . THROAT SURGERY  2014   "arthritis hugh knot" (02/03/2014)    MEDICATIONS: . polyethylene glycol (MIRALAX / GLYCOLAX) packet  . protein supplement (RESOURCE BENEPROTEIN) POWD  . acetaminophen (TYLENOL) 650 MG CR tablet  . amitriptyline (ELAVIL) 25 MG tablet  . atorvastatin (LIPITOR) 20 MG tablet  . camphor-menthol (SARNA) lotion  . Cyanocobalamin (VITAMIN B-12 PO)  . diclofenac sodium (VOLTAREN) 1 % GEL  . escitalopram (LEXAPRO) 5 MG tablet  . estradiol (VIVELLE-DOT) 0.1 MG/24HR patch  . liothyronine (CYTOMEL) 5 MCG tablet  . meloxicam (MOBIC) 7.5 MG tablet  . Multiple Vitamins-Minerals (CENTRUM SILVER PO)   No current facility-administered medications for this encounter.      Konrad Felix, PA-C [facility] Pre-Surgical  Testing  [phone] 01/13/19 3:42 PM

## 2019-01-13 NOTE — Anesthesia Preprocedure Evaluation (Addendum)
Anesthesia Evaluation  Patient identified by MRN, date of birth, ID band Patient awake    Reviewed: Allergy & Precautions, NPO status , Patient's Chart, lab work & pertinent test results  Airway Mallampati: I  TM Distance: >3 FB Neck ROM: Full    Dental  (+) Teeth Intact, Caps   Pulmonary former smoker,    Pulmonary exam normal breath sounds clear to auscultation       Cardiovascular hypertension, Normal cardiovascular exam Rhythm:Regular Rate:Normal  EKG - LBBB pattern Echo 12/07/2018-NORMAL LEFT VENTRICULAR SYSTOLIC FUNCTION NORMAL RIGHT VENTRICULAR SYSTOLIC FUNCTION MILD VALVULAR REGURGITATION (See above) NO VALVULAR STENOSIS MILD MR, TR, PR EF 50% Closest EF: >55% (Estimated) Mitral: MILD MR Tricuspid: MILD TR   Neuro/Psych PSYCHIATRIC DISORDERS Anxiety negative neurological ROS     GI/Hepatic Neg liver ROS, GERD  Medicated and Controlled,  Endo/Other  Hypothyroidism Hypercholesterolemia  Renal/GU negative Renal ROS  negative genitourinary   Musculoskeletal  (+) Arthritis , Osteoarthritis,  DJD left Knee   Abdominal   Peds  Hematology negative hematology ROS (+)   Anesthesia Other Findings   Reproductive/Obstetrics                            Anesthesia Physical Anesthesia Plan  ASA: II  Anesthesia Plan: Spinal   Post-op Pain Management:  Regional for Post-op pain   Induction: Intravenous  PONV Risk Score and Plan: 3  Airway Management Planned: Natural Airway, Nasal Cannula and Simple Face Mask  Additional Equipment:   Intra-op Plan:   Post-operative Plan:   Informed Consent: I have reviewed the patients History and Physical, chart, labs and discussed the procedure including the risks, benefits and alternatives for the proposed anesthesia with the patient or authorized representative who has indicated his/her understanding and acceptance.     Dental advisory  given  Plan Discussed with: CRNA and Surgeon  Anesthesia Plan Comments: (See PAT note 01/11/19, Konrad Felix, PA-C)       Anesthesia Quick Evaluation

## 2019-01-14 ENCOUNTER — Other Ambulatory Visit: Payer: Self-pay | Admitting: Orthopedic Surgery

## 2019-01-14 NOTE — Care Plan (Signed)
Spoke with patient prior to surgery. She will discharge to home with family and HHPT. Gilford Rile has been ordered. OPPT has been arranged at facility of her choice to start after her MD follow up on March 10.    Ladell Heads, [place]

## 2019-01-17 ENCOUNTER — Other Ambulatory Visit: Payer: Self-pay | Admitting: Orthopedic Surgery

## 2019-01-18 ENCOUNTER — Ambulatory Visit (HOSPITAL_COMMUNITY): Admission: RE | Admit: 2019-01-18 | Payer: Medicare Other | Source: Ambulatory Visit | Admitting: Orthopedic Surgery

## 2019-01-18 ENCOUNTER — Telehealth (HOSPITAL_COMMUNITY): Payer: Self-pay | Admitting: *Deleted

## 2019-01-18 ENCOUNTER — Encounter (HOSPITAL_COMMUNITY): Admission: RE | Payer: Self-pay | Source: Ambulatory Visit

## 2019-01-18 SURGERY — ARTHROPLASTY, KNEE, UNICOMPARTMENTAL
Anesthesia: Choice | Laterality: Left

## 2019-01-19 ENCOUNTER — Other Ambulatory Visit: Payer: Self-pay | Admitting: Internal Medicine

## 2019-01-21 ENCOUNTER — Other Ambulatory Visit: Payer: Self-pay | Admitting: Orthopedic Surgery

## 2019-01-21 NOTE — Care Plan (Signed)
Spoke with patient prior to surgery. She plans to discharge to home with family.  States that husband will be able to assist and get meals as needed. HHPT has been arranged and OPPT to follow at facility of her choice. Rolling walker has been ordered. Patient aware and agreeable with plan.  Choice offered.   Emily Wu, [place]

## 2019-01-25 ENCOUNTER — Other Ambulatory Visit: Payer: Self-pay

## 2019-01-25 ENCOUNTER — Encounter (HOSPITAL_COMMUNITY): Payer: Self-pay | Admitting: *Deleted

## 2019-01-25 NOTE — Progress Notes (Signed)
Spoke with pt for pre-op call. Pt had a PAT appt at [facility] a couple of weeks ago, but surgery got cancelled due to the surgeon was sick. Pt has had a cardiology workup in January and has been given clearance. She denies any recent chest pain or sob. Pt states she is not diabetic. Clearance is in Care Everywhere.

## 2019-01-26 ENCOUNTER — Other Ambulatory Visit: Payer: Self-pay

## 2019-01-26 ENCOUNTER — Encounter (HOSPITAL_COMMUNITY): Payer: Self-pay

## 2019-01-26 ENCOUNTER — Encounter (HOSPITAL_COMMUNITY)
Admission: RE | Disposition: A | Discharge status: Home / Self Care | Payer: Self-pay | Source: Home / Self Care | Attending: Orthopedic Surgery

## 2019-01-26 ENCOUNTER — Observation Stay (HOSPITAL_COMMUNITY): Payer: Medicare Other

## 2019-01-26 ENCOUNTER — Ambulatory Visit (HOSPITAL_COMMUNITY): Payer: Medicare Other | Admitting: Physician Assistant

## 2019-01-26 ENCOUNTER — Inpatient Hospital Stay (HOSPITAL_COMMUNITY)
Admission: RE | Admit: 2019-01-26 | Discharge: 2019-01-28 | DRG: 470 | Disposition: A | Discharge status: Home / Self Care | Payer: Medicare Other | Attending: Orthopedic Surgery | Admitting: Orthopedic Surgery

## 2019-01-26 ENCOUNTER — Ambulatory Visit (HOSPITAL_COMMUNITY): Payer: Medicare Other | Admitting: Vascular Surgery

## 2019-01-26 DIAGNOSIS — Z8541 Personal history of malignant neoplasm of cervix uteri: Secondary | ICD-10-CM

## 2019-01-26 DIAGNOSIS — I10 Essential (primary) hypertension: Secondary | ICD-10-CM | POA: Diagnosis not present

## 2019-01-26 DIAGNOSIS — Z96652 Presence of left artificial knee joint: Secondary | ICD-10-CM

## 2019-01-26 DIAGNOSIS — F419 Anxiety disorder, unspecified: Secondary | ICD-10-CM | POA: Diagnosis present

## 2019-01-26 DIAGNOSIS — Z87891 Personal history of nicotine dependence: Secondary | ICD-10-CM

## 2019-01-26 DIAGNOSIS — Z87442 Personal history of urinary calculi: Secondary | ICD-10-CM | POA: Diagnosis not present

## 2019-01-26 DIAGNOSIS — Z882 Allergy status to sulfonamides status: Secondary | ICD-10-CM

## 2019-01-26 DIAGNOSIS — E039 Hypothyroidism, unspecified: Secondary | ICD-10-CM | POA: Diagnosis present

## 2019-01-26 DIAGNOSIS — Z90710 Acquired absence of both cervix and uterus: Secondary | ICD-10-CM

## 2019-01-26 DIAGNOSIS — M1712 Unilateral primary osteoarthritis, left knee: Secondary | ICD-10-CM

## 2019-01-26 DIAGNOSIS — Z881 Allergy status to other antibiotic agents status: Secondary | ICD-10-CM

## 2019-01-26 DIAGNOSIS — Z79899 Other long term (current) drug therapy: Secondary | ICD-10-CM

## 2019-01-26 DIAGNOSIS — Z888 Allergy status to other drugs, medicaments and biological substances status: Secondary | ICD-10-CM

## 2019-01-26 DIAGNOSIS — Z8349 Family history of other endocrine, nutritional and metabolic diseases: Secondary | ICD-10-CM

## 2019-01-26 DIAGNOSIS — G8918 Other acute postprocedural pain: Secondary | ICD-10-CM | POA: Diagnosis not present

## 2019-01-26 DIAGNOSIS — K219 Gastro-esophageal reflux disease without esophagitis: Secondary | ICD-10-CM | POA: Diagnosis present

## 2019-01-26 DIAGNOSIS — E7800 Pure hypercholesterolemia, unspecified: Secondary | ICD-10-CM | POA: Diagnosis present

## 2019-01-26 DIAGNOSIS — Z885 Allergy status to narcotic agent status: Secondary | ICD-10-CM

## 2019-01-26 DIAGNOSIS — Z8249 Family history of ischemic heart disease and other diseases of the circulatory system: Secondary | ICD-10-CM | POA: Diagnosis not present

## 2019-01-26 DIAGNOSIS — Z9049 Acquired absence of other specified parts of digestive tract: Secondary | ICD-10-CM | POA: Diagnosis not present

## 2019-01-26 DIAGNOSIS — Z471 Aftercare following joint replacement surgery: Secondary | ICD-10-CM | POA: Diagnosis not present

## 2019-01-26 HISTORY — DX: Personal history of urinary calculi: Z87.442

## 2019-01-26 HISTORY — PX: PARTIAL KNEE ARTHROPLASTY: SHX2174

## 2019-01-26 LAB — CBC
HCT: 37.5 % (ref 36.0–46.0)
Hemoglobin: 12.1 g/dL (ref 12.0–15.0)
MCH: 30.2 pg (ref 26.0–34.0)
MCHC: 32.3 g/dL (ref 30.0–36.0)
MCV: 93.5 fL (ref 80.0–100.0)
Platelets: 261 10*3/uL (ref 150–400)
RBC: 4.01 MIL/uL (ref 3.87–5.11)
RDW: 12.9 % (ref 11.5–15.5)
WBC: 7 10*3/uL (ref 4.0–10.5)
nRBC: 0 % (ref 0.0–0.2)

## 2019-01-26 LAB — BASIC METABOLIC PANEL
Anion gap: 10 (ref 5–15)
BUN: 13 mg/dL (ref 8–23)
CO2: 21 mmol/L — ABNORMAL LOW (ref 22–32)
Calcium: 9.2 mg/dL (ref 8.9–10.3)
Chloride: 108 mmol/L (ref 98–111)
Creatinine, Ser: 0.8 mg/dL (ref 0.44–1.00)
GFR calc Af Amer: >60 mL/min (ref >60)
GFR calc non Af Amer: >60 mL/min (ref >60)
Glucose, Bld: 89 mg/dL (ref 70–99)
Potassium: 3.6 mmol/L (ref 3.5–5.1)
Sodium: 139 mmol/L (ref 135–145)

## 2019-01-26 SURGERY — ARTHROPLASTY, KNEE, UNICOMPARTMENTAL
Anesthesia: Spinal | Laterality: Left

## 2019-01-26 MED ORDER — MAGNESIUM CITRATE PO SOLN: 1.0000 | Freq: Once | ORAL | Status: DC | PRN

## 2019-01-26 MED ORDER — BUPIVACAINE HCL (PF) 0.25 % IJ SOLN
INTRAMUSCULAR | Status: DC | PRN
  Administered 2019-01-26: 30 mL

## 2019-01-26 MED ORDER — SENNA-DOCUSATE SODIUM 8.6-50 MG PO TABS: 2.0000 | ORAL_TABLET | Freq: Every day | ORAL | 1 refills | Status: DC

## 2019-01-26 MED ORDER — AMITRIPTYLINE HCL 25 MG PO TABS
25.0000 mg | ORAL_TABLET | Freq: Every day | ORAL | Status: DC
  Administered 2019-01-26 – 2019-01-27 (×2): 25 mg via ORAL
  Filled 2019-01-26 (×2): qty 1

## 2019-01-26 MED ORDER — CEFAZOLIN SODIUM-DEXTROSE 1-4 GM/50ML-% IV SOLN
1.0000 g | Freq: Four times a day (QID) | INTRAVENOUS | Status: AC
  Administered 2019-01-26 – 2019-01-27 (×2): 1 g via INTRAVENOUS
  Filled 2019-01-26 (×2): qty 50

## 2019-01-26 MED ORDER — OXYCODONE HCL 5 MG PO TABS: 5.0000 mg | ORAL_TABLET | Freq: Once | ORAL | Status: DC | PRN

## 2019-01-26 MED ORDER — MIDAZOLAM HCL 2 MG/2ML IJ SOLN
INTRAMUSCULAR | Status: AC
  Filled 2019-01-26: qty 2

## 2019-01-26 MED ORDER — MIDAZOLAM HCL 2 MG/2ML IJ SOLN: 2.0000 mg | Freq: Once | INTRAMUSCULAR | Status: DC

## 2019-01-26 MED ORDER — LACTATED RINGERS IV SOLN
INTRAVENOUS | Status: DC | PRN
  Administered 2019-01-26: 12:00:00 via INTRAVENOUS

## 2019-01-26 MED ORDER — DEXAMETHASONE SODIUM PHOSPHATE 10 MG/ML IJ SOLN
10.0000 mg | Freq: Once | INTRAMUSCULAR | Status: AC
  Administered 2019-01-27: 10 mg via INTRAVENOUS
  Filled 2019-01-26: qty 1

## 2019-01-26 MED ORDER — KETOROLAC TROMETHAMINE 30 MG/ML IJ SOLN
INTRAMUSCULAR | Status: AC
  Filled 2019-01-26: qty 1

## 2019-01-26 MED ORDER — DOCUSATE SODIUM 100 MG PO CAPS
100.0000 mg | ORAL_CAPSULE | Freq: Two times a day (BID) | ORAL | Status: DC
  Administered 2019-01-27 – 2019-01-28 (×3): 100 mg via ORAL
  Filled 2019-01-26 (×4): qty 1

## 2019-01-26 MED ORDER — 0.9 % SODIUM CHLORIDE (POUR BTL) OPTIME
TOPICAL | Status: DC | PRN
  Administered 2019-01-26: 1000 mL

## 2019-01-26 MED ORDER — ACETAMINOPHEN 500 MG PO TABS
500.0000 mg | ORAL_TABLET | Freq: Four times a day (QID) | ORAL | Status: AC
  Administered 2019-01-26 – 2019-01-27 (×3): 500 mg via ORAL
  Filled 2019-01-26 (×3): qty 1

## 2019-01-26 MED ORDER — POTASSIUM CHLORIDE IN NACL 20-0.45 MEQ/L-% IV SOLN
INTRAVENOUS | Status: DC
  Administered 2019-01-26: 21:00:00 via INTRAVENOUS
  Filled 2019-01-26 (×2): qty 1000

## 2019-01-26 MED ORDER — BISACODYL 10 MG RE SUPP: 10.0000 mg | Freq: Every day | RECTAL | Status: DC | PRN

## 2019-01-26 MED ORDER — ASPIRIN EC 325 MG PO TBEC
325.0000 mg | DELAYED_RELEASE_TABLET | Freq: Two times a day (BID) | ORAL | Status: DC
  Administered 2019-01-26 – 2019-01-28 (×4): 325 mg via ORAL
  Filled 2019-01-26 (×4): qty 1

## 2019-01-26 MED ORDER — ZOLPIDEM TARTRATE 5 MG PO TABS
5.0000 mg | ORAL_TABLET | Freq: Every evening | ORAL | Status: DC | PRN
  Administered 2019-01-26: 5 mg via ORAL
  Filled 2019-01-26: qty 1

## 2019-01-26 MED ORDER — ASPIRIN EC 325 MG PO TBEC: 325.0000 mg | DELAYED_RELEASE_TABLET | Freq: Two times a day (BID) | ORAL | 0 refills | Status: DC

## 2019-01-26 MED ORDER — SODIUM CHLORIDE 0.9 % IR SOLN
Status: DC | PRN
  Administered 2019-01-26: 1000 mL

## 2019-01-26 MED ORDER — ALUM & MAG HYDROXIDE-SIMETH 200-200-20 MG/5ML PO SUSP: 30.0000 mL | ORAL | Status: DC | PRN

## 2019-01-26 MED ORDER — CHLORHEXIDINE GLUCONATE 4 % EX LIQD: 60.0000 mL | Freq: Once | CUTANEOUS | Status: DC

## 2019-01-26 MED ORDER — MORPHINE SULFATE (PF) 2 MG/ML IV SOLN: 0.5000 mg | INTRAVENOUS | Status: DC | PRN

## 2019-01-26 MED ORDER — HYDROCODONE-ACETAMINOPHEN 7.5-325 MG PO TABS
1.0000 | ORAL_TABLET | ORAL | Status: DC | PRN
  Administered 2019-01-27: 1 via ORAL
  Administered 2019-01-28: 2 via ORAL
  Filled 2019-01-26 (×2): qty 1
  Filled 2019-01-26: qty 2

## 2019-01-26 MED ORDER — ACETAMINOPHEN 325 MG PO TABS: 325.0000 mg | ORAL_TABLET | Freq: Four times a day (QID) | ORAL | Status: DC | PRN

## 2019-01-26 MED ORDER — TRANEXAMIC ACID 1000 MG/10ML IV SOLN
2000.0000 mg | INTRAVENOUS | Status: DC
  Filled 2019-01-26: qty 20

## 2019-01-26 MED ORDER — METHOCARBAMOL 500 MG PO TABS
500.0000 mg | ORAL_TABLET | Freq: Four times a day (QID) | ORAL | Status: DC | PRN
  Administered 2019-01-27 (×2): 500 mg via ORAL
  Filled 2019-01-26 (×2): qty 1

## 2019-01-26 MED ORDER — BUPIVACAINE-EPINEPHRINE (PF) 0.5% -1:200000 IJ SOLN
INTRAMUSCULAR | Status: DC | PRN
  Administered 2019-01-26: 30 mL via PERINEURAL

## 2019-01-26 MED ORDER — CEFAZOLIN SODIUM-DEXTROSE 2-4 GM/100ML-% IV SOLN
2.0000 g | INTRAVENOUS | Status: AC
  Administered 2019-01-26: 2 g via INTRAVENOUS

## 2019-01-26 MED ORDER — HYDROMORPHONE HCL 1 MG/ML IJ SOLN: 0.2500 mg | INTRAMUSCULAR | Status: DC | PRN

## 2019-01-26 MED ORDER — PROPOFOL 10 MG/ML IV BOLUS
INTRAVENOUS | Status: DC | PRN
  Administered 2019-01-26: 20 mg via INTRAVENOUS

## 2019-01-26 MED ORDER — ATORVASTATIN CALCIUM 10 MG PO TABS
20.0000 mg | ORAL_TABLET | Freq: Every day | ORAL | Status: DC
  Administered 2019-01-26 – 2019-01-27 (×2): 20 mg via ORAL
  Filled 2019-01-26 (×2): qty 2

## 2019-01-26 MED ORDER — MENTHOL 3 MG MT LOZG: 1.0000 | LOZENGE | OROMUCOSAL | Status: DC | PRN

## 2019-01-26 MED ORDER — LIOTHYRONINE SODIUM 5 MCG PO TABS
10.0000 ug | ORAL_TABLET | Freq: Every day | ORAL | Status: DC
  Administered 2019-01-26 – 2019-01-28 (×3): 10 ug via ORAL
  Filled 2019-01-26 (×3): qty 2

## 2019-01-26 MED ORDER — METOCLOPRAMIDE HCL 5 MG PO TABS: 5.0000 mg | ORAL_TABLET | Freq: Three times a day (TID) | ORAL | Status: DC | PRN

## 2019-01-26 MED ORDER — POLYETHYLENE GLYCOL 3350 17 G PO PACK: 17.0000 g | PACK | Freq: Every day | ORAL | Status: DC | PRN

## 2019-01-26 MED ORDER — CEFAZOLIN SODIUM-DEXTROSE 2-4 GM/100ML-% IV SOLN
INTRAVENOUS | Status: AC
  Filled 2019-01-26: qty 100

## 2019-01-26 MED ORDER — ONDANSETRON HCL 4 MG/2ML IJ SOLN
INTRAMUSCULAR | Status: DC | PRN
  Administered 2019-01-26: 4 mg via INTRAVENOUS

## 2019-01-26 MED ORDER — TRANEXAMIC ACID-NACL 1000-0.7 MG/100ML-% IV SOLN
INTRAVENOUS | Status: AC
  Filled 2019-01-26: qty 100

## 2019-01-26 MED ORDER — TRANEXAMIC ACID-NACL 1000-0.7 MG/100ML-% IV SOLN
INTRAVENOUS | Status: DC | PRN
  Administered 2019-01-26: 1000 mg via INTRAVENOUS

## 2019-01-26 MED ORDER — PROPOFOL 500 MG/50ML IV EMUL
INTRAVENOUS | Status: DC | PRN
  Administered 2019-01-26: 75 ug/kg/min via INTRAVENOUS

## 2019-01-26 MED ORDER — ONDANSETRON HCL 4 MG PO TABS: 4.0000 mg | ORAL_TABLET | Freq: Three times a day (TID) | ORAL | 0 refills | Status: DC | PRN

## 2019-01-26 MED ORDER — ONDANSETRON HCL 4 MG/2ML IJ SOLN
4.0000 mg | Freq: Four times a day (QID) | INTRAMUSCULAR | Status: DC | PRN
  Administered 2019-01-27: 4 mg via INTRAVENOUS
  Filled 2019-01-26: qty 2

## 2019-01-26 MED ORDER — HYDROCODONE-ACETAMINOPHEN 5-325 MG PO TABS: 1.0000 | ORAL_TABLET | ORAL | Status: DC | PRN

## 2019-01-26 MED ORDER — METOCLOPRAMIDE HCL 5 MG/ML IJ SOLN: 5.0000 mg | Freq: Three times a day (TID) | INTRAMUSCULAR | Status: DC | PRN

## 2019-01-26 MED ORDER — ONDANSETRON HCL 4 MG PO TABS: 4.0000 mg | ORAL_TABLET | Freq: Four times a day (QID) | ORAL | Status: DC | PRN

## 2019-01-26 MED ORDER — OXYCODONE HCL 5 MG/5ML PO SOLN: 5.0000 mg | Freq: Once | ORAL | Status: DC | PRN

## 2019-01-26 MED ORDER — FENTANYL CITRATE (PF) 100 MCG/2ML IJ SOLN
INTRAMUSCULAR | Status: AC
  Administered 2019-01-26: 50 ug via INTRAVENOUS
  Filled 2019-01-26: qty 2

## 2019-01-26 MED ORDER — DIPHENHYDRAMINE HCL 12.5 MG/5ML PO ELIX
12.5000 mg | ORAL_SOLUTION | ORAL | Status: DC | PRN
  Administered 2019-01-27: 25 mg via ORAL
  Filled 2019-01-26: qty 10

## 2019-01-26 MED ORDER — TRANEXAMIC ACID-NACL 1000-0.7 MG/100ML-% IV SOLN
1000.0000 mg | Freq: Once | INTRAVENOUS | Status: AC
  Administered 2019-01-26: 1000 mg via INTRAVENOUS
  Filled 2019-01-26: qty 100

## 2019-01-26 MED ORDER — KETOROLAC TROMETHAMINE 30 MG/ML IJ SOLN
INTRAMUSCULAR | Status: DC | PRN
  Administered 2019-01-26: 30 mg via INTRAVENOUS

## 2019-01-26 MED ORDER — SODIUM CHLORIDE 0.9 % IV SOLN
INTRAVENOUS | Status: DC | PRN
  Administered 2019-01-26: 25 ug/min via INTRAVENOUS

## 2019-01-26 MED ORDER — BUPIVACAINE IN DEXTROSE 0.75-8.25 % IT SOLN
INTRATHECAL | Status: DC | PRN
  Administered 2019-01-26: 1.8 mL via INTRATHECAL

## 2019-01-26 MED ORDER — HYDROCODONE-ACETAMINOPHEN 5-325 MG PO TABS: 1.0000 | ORAL_TABLET | Freq: Four times a day (QID) | ORAL | 0 refills | Status: DC | PRN

## 2019-01-26 MED ORDER — ESCITALOPRAM OXALATE 10 MG PO TABS
5.0000 mg | ORAL_TABLET | Freq: Every day | ORAL | Status: DC
  Administered 2019-01-26 – 2019-01-27 (×2): 5 mg via ORAL
  Filled 2019-01-26 (×2): qty 1

## 2019-01-26 MED ORDER — FENTANYL CITRATE (PF) 100 MCG/2ML IJ SOLN
100.0000 ug | Freq: Once | INTRAMUSCULAR | Status: AC
  Administered 2019-01-26: 50 ug via INTRAVENOUS

## 2019-01-26 MED ORDER — METHOCARBAMOL 1000 MG/10ML IJ SOLN
500.0000 mg | Freq: Four times a day (QID) | INTRAVENOUS | Status: DC | PRN
  Filled 2019-01-26: qty 5

## 2019-01-26 MED ORDER — BENEPROTEIN PO POWD
1.0000 | Freq: Three times a day (TID) | ORAL | Status: DC | PRN
  Filled 2019-01-26: qty 227

## 2019-01-26 MED ORDER — BUPIVACAINE HCL (PF) 0.25 % IJ SOLN
INTRAMUSCULAR | Status: AC
  Filled 2019-01-26: qty 30

## 2019-01-26 MED ORDER — PHENOL 1.4 % MT LIQD: 1.0000 | OROMUCOSAL | Status: DC | PRN

## 2019-01-26 SURGICAL SUPPLY — 54 items
BANDAGE ESMARK 6X9 LF (GAUZE/BANDAGES/DRESSINGS) ×1 IMPLANT
BEARING MENISCAL TIBIAL 4 SM L (Orthopedic Implant) ×1 IMPLANT
BNDG CMPR 9X6 STRL LF SNTH (GAUZE/BANDAGES/DRESSINGS) ×1
BNDG CMPR MED 15X6 ELC VLCR LF (GAUZE/BANDAGES/DRESSINGS) ×1
BNDG ELASTIC 6X15 VLCR STRL LF (GAUZE/BANDAGES/DRESSINGS) ×2 IMPLANT
BNDG ESMARK 6X9 LF (GAUZE/BANDAGES/DRESSINGS) ×2
BOWL SMART MIX CTS (DISPOSABLE) ×2 IMPLANT
BRNG TIB B UNCMP STRL LM/RL (Joint) ×1 IMPLANT
BRNG TIB SM 4 PHS 3 LT MEN (Orthopedic Implant) ×1 IMPLANT
CLSR STERI-STRIP ANTIMIC 1/2X4 (GAUZE/BANDAGES/DRESSINGS) ×2 IMPLANT
COVER SURGICAL LIGHT HANDLE (MISCELLANEOUS) ×2 IMPLANT
COVER WAND RF STERILE (DRAPES) ×2 IMPLANT
CUFF TOURNIQUET SINGLE 34IN LL (TOURNIQUET CUFF) ×2 IMPLANT
DRAPE EXTREMITY T 121X128X90 (DISPOSABLE) IMPLANT
DRAPE HALF SHEET 40X57 (DRAPES) ×2 IMPLANT
DRAPE U-SHAPE 47X51 STRL (DRAPES) ×2 IMPLANT
DRSG MEPILEX BORDER 4X8 (GAUZE/BANDAGES/DRESSINGS) ×2 IMPLANT
DURAPREP 26ML APPLICATOR (WOUND CARE) ×2 IMPLANT
ELECT CAUTERY BLADE 6.4 (BLADE) ×2 IMPLANT
ELECT REM PT RETURN 9FT ADLT (ELECTROSURGICAL) ×2
ELECTRODE REM PT RTRN 9FT ADLT (ELECTROSURGICAL) ×1 IMPLANT
GLOVE BIOGEL PI ORTHO PRO SZ8 (GLOVE) ×2
GLOVE ORTHO TXT STRL SZ7.5 (GLOVE) ×2 IMPLANT
GLOVE PI ORTHO PRO STRL SZ8 (GLOVE) ×2 IMPLANT
GLOVE SURG ORTHO 8.0 STRL STRW (GLOVE) ×2 IMPLANT
GOWN STRL REUS W/ TWL XL LVL3 (GOWN DISPOSABLE) ×1 IMPLANT
GOWN STRL REUS W/TWL 2XL LVL3 (GOWN DISPOSABLE) ×2 IMPLANT
GOWN STRL REUS W/TWL XL LVL3 (GOWN DISPOSABLE) ×2
HANDPIECE INTERPULSE COAX TIP (DISPOSABLE) ×2
HOOD PEEL AWAY FACE SHEILD DIS (HOOD) ×2 IMPLANT
HOOD PEEL AWAY FLYTE STAYCOOL (MISCELLANEOUS) ×2 IMPLANT
IMMOBILIZER KNEE 22 UNIV (SOFTGOODS) ×2 IMPLANT
INSERT TIBIAL OXFORD SZ B LF (Joint) ×1 IMPLANT
KIT BASIN OR (CUSTOM PROCEDURE TRAY) ×2 IMPLANT
KIT TURNOVER KIT B (KITS) ×2 IMPLANT
MANIFOLD NEPTUNE II (INSTRUMENTS) ×2 IMPLANT
NDL HYPO 21X1.5 SAFETY (NEEDLE) IMPLANT
NEEDLE HYPO 21X1.5 SAFETY (NEEDLE) IMPLANT
NS IRRIG 1000ML POUR BTL (IV SOLUTION) ×2 IMPLANT
PACK BLADE SAW RECIP 70 3 PT (BLADE) ×1 IMPLANT
PACK TOTAL JOINT (CUSTOM PROCEDURE TRAY) ×2 IMPLANT
PAD ARMBOARD 7.5X6 YLW CONV (MISCELLANEOUS) ×4 IMPLANT
PEG FEMORAL PEGGED STRL SM (Knees) ×1 IMPLANT
SET HNDPC FAN SPRY TIP SCT (DISPOSABLE) ×1 IMPLANT
STRIP CLOSURE SKIN 1/2X4 (GAUZE/BANDAGES/DRESSINGS) ×2 IMPLANT
SUCTION FRAZIER HANDLE 10FR (MISCELLANEOUS) ×1
SUCTION TUBE FRAZIER 10FR DISP (MISCELLANEOUS) ×1 IMPLANT
SUT VIC AB 0 CT1 27 (SUTURE) ×2
SUT VIC AB 0 CT1 27XBRD ANBCTR (SUTURE) ×1 IMPLANT
SUT VIC AB 1 CT1 27 (SUTURE) ×2
SUT VIC AB 1 CT1 27XBRD ANBCTR (SUTURE) ×1 IMPLANT
SUT VIC AB 3-0 SH 8-18 (SUTURE) ×2 IMPLANT
SYR CONTROL 10ML LL (SYRINGE) IMPLANT
TOWEL OR 17X26 10 PK STRL BLUE (TOWEL DISPOSABLE) ×2 IMPLANT

## 2019-01-26 NOTE — Anesthesia Procedure Notes (Signed)
Spinal  Patient location during procedure: OR Start time: 01/26/2019 3:03 PM End time: 01/26/2019 3:08 PM Staffing Anesthesiologist: Lynda Rainwater, MD Performed: anesthesiologist  Preanesthetic Checklist Completed: patient identified, site marked, surgical consent, pre-op evaluation, timeout performed, IV checked, risks and benefits discussed and monitors and equipment checked Spinal Block Patient position: sitting Prep: Betadine Patient monitoring: heart rate, cardiac monitor, continuous pulse ox and blood pressure Approach: midline Location: L3-4 Injection technique: single-shot Needle Needle type: Quincke  Needle gauge: 22 G Needle length: 9 cm Assessment Sensory level: T4

## 2019-01-26 NOTE — Anesthesia Postprocedure Evaluation (Signed)
Anesthesia Post Note  Patient: GOLDIE TREGONING  Procedure(s) Performed: UNICOMPARTMENTAL KNEE (Left )     Patient location during evaluation: PACU Anesthesia Type: Regional Level of consciousness: oriented and awake and alert Pain management: pain level controlled Vital Signs Assessment: post-procedure vital signs reviewed and stable Respiratory status: spontaneous breathing, respiratory function stable and patient connected to nasal cannula oxygen Cardiovascular status: blood pressure returned to baseline and stable Postop Assessment: no headache, no backache and no apparent nausea or vomiting Anesthetic complications: no    Last Vitals:  Vitals:   01/26/19 1128 01/26/19 1700  BP: (!) 148/54 (!) 115/50  Pulse: 68 79  Resp: 18 19  Temp: 36.5 C (!) 36.3 C  SpO2: 97% 95%    Last Pain:  Vitals:   01/26/19 1700  TempSrc:   PainSc: 0-No pain    LLE Motor Response: No movement due to regional block (01/26/19 1700) LLE Sensation: No sensation (absent) (01/26/19 1700) RLE Motor Response: No movement due to regional block (01/26/19 1700) RLE Sensation: No sensation (absent) (01/26/19 1700) L Sensory Level: L2-Upper inner thigh, upper buttock (01/26/19 1700) R Sensory Level: L2-Upper inner thigh, upper buttock (01/26/19 1700)  Caeleigh Prohaska DAVID

## 2019-01-26 NOTE — H&P (Signed)
PREOPERATIVE H&P  Chief Complaint: Left knee pain  HPI: MADIGAN ROSENSTEEL is a 85 y.o. female who presents for preoperative history and physical with a diagnosis of left knee anteromedial osteoarthritis. Symptoms are rated as moderate to severe, and have been worsening.  This is significantly impairing activities of daily living.  She has elected for surgical management.   She has failed injections, activity modification, anti-inflammatories, and assistive devices.  Preoperative X-rays demonstrate end stage degenerative changes with osteophyte formation, loss of joint space, subchondral sclerosis.  She has had both Depo-Medrol injections as well as viscosupplementation.  She had a knee arthroscopy 10 years ago at [facility].  Past Medical History:  Diagnosis Date  . Arthritis    "knees, shoulders, chest" (02/03/2014)  . Bruises easily   . Cervical cancer ([place]) 1965  . High cholesterol   . History of kidney stones   . Hypertension    DENIES   . Tendency toward bleeding easily [facility])    Past Surgical History:  Procedure Laterality Date  . ABDOMINAL HYSTERECTOMY  1965  . APPENDECTOMY    . CHOLECYSTECTOMY    . KNEE ARTHROSCOPY Left    [facility]  . SHOULDER OPEN ROTATOR CUFF REPAIR Bilateral 2007,  2010   [facility]  . THROAT SURGERY  2014   "arthritis hugh knot" (02/03/2014)   Social History   Socioeconomic History  . Marital status: Married    Spouse name: Not on file  . Number of children: Not on file  . Years of education: Not on file  . Highest education level: Not on file  Occupational History  . Not on file  Social Needs  . Financial resource strain: Not on file  . Food insecurity:    Worry: Not on file    Inability: Not on file  . Transportation needs:    Medical: Not on file    Non-medical: Not on file  Tobacco Use  . Smoking status: Former Smoker    Packs/day: 2.00    Years: 7.00    Pack years: 14.00    Types: Cigarettes  . Smokeless tobacco: Never Used  . Tobacco  comment: 02/03/2014 "quit smoking in the 1960's"  Substance and Sexual Activity  . Alcohol use: No  . Drug use: No  . Sexual activity: Never  Lifestyle  . Physical activity:    Days per week: Not on file    Minutes per session: Not on file  . Stress: Not on file  Relationships  . Social connections:    Talks on phone: Not on file    Gets together: Not on file    Attends religious service: Not on file    Active member of club or organization: Not on file    Attends meetings of clubs or organizations: Not on file    Relationship status: Not on file  Other Topics Concern  . Not on file  Social History Narrative  . Not on file   Family History  Problem Relation Age of Onset  . Peripheral vascular disease Mother   . Stroke Father   . Hyperlipidemia Father   . Heart disease Father 92  . Cancer Neg Hx    Allergies  Allergen Reactions  . Tramadol Anaphylaxis and Other (See Comments)    Lowered Blood Pressure  . Amoxicillin Rash  . Promethazine Hcl Rash  . Sulfa Drugs Cross Reactors Rash   Prior to Admission medications   Medication Sig Start Date End Date Taking? Authorizing Provider  acetaminophen (  TYLENOL) 650 MG CR tablet Take 650 mg by mouth every 8 (eight) hours as needed for pain.    Yes [provider]  amitriptyline (ELAVIL) 25 MG tablet TAKE 1 TABLET BY MOUTH DAILY Patient taking differently: Take 25 mg by mouth at bedtime.  12/15/18  Yes Crecencio Mc, MD  atorvastatin (LIPITOR) 20 MG tablet TAKE 1 TABLET BY MOUTH DAILY Patient taking differently: Take 20 mg by mouth at bedtime.  08/03/18  Yes Crecencio Mc, MD  camphor-menthol [facility]) lotion Apply 1 application topically daily as needed for itching.   Yes [provider]  Cyanocobalamin (VITAMIN B-12 PO) Take 1 tablet by mouth daily.   Yes [provider]  diclofenac sodium (VOLTAREN) 1 % GEL Apply 2 g topically 4 (four) times daily as needed (for knee pain). 12/24/18  Yes Crecencio Mc,  MD  escitalopram (LEXAPRO) 5 MG tablet Take 1 tablet (5 mg total) by mouth daily. Patient taking differently: Take 5 mg by mouth at bedtime.  12/24/18  Yes Crecencio Mc, MD  estradiol (VIVELLE-DOT) 0.1 MG/24HR patch APPLY 1 PATCH ONTO SKIN TWICE WEEKLY AS DIRECTED 01/19/19  Yes Crecencio Mc, MD  liothyronine (CYTOMEL) 5 MCG tablet TAKE TWO TABLETS BY MOUTH EVERY DAY 01/03/19  Yes Crecencio Mc, MD  meloxicam (MOBIC) 7.5 MG tablet TAKE 1 TABLET BY MOUTH DAILY AS NEEDED FOR JOINT PAIN Patient taking differently: Take 7.5 mg by mouth daily.  08/25/18  Yes Crecencio Mc, MD  Multiple Vitamins-Minerals (CENTRUM SILVER PO) Take 1 tablet by mouth daily.   Yes [provider]  polyethylene glycol (MIRALAX / GLYCOLAX) packet Take 17 g by mouth daily as needed.   Yes [provider]  protein supplement (RESOURCE BENEPROTEIN) POWD Take 1 scoop by mouth 3 (three) times daily as needed.   Yes [provider]     Positive ROS: All other systems have been reviewed and were otherwise negative with the exception of those mentioned in the HPI and as above.  Physical Exam: General: Alert, no acute distress Cardiovascular: No pedal edema Respiratory: No cyanosis, no use of accessory musculature GI: No organomegaly, abdomen is soft and non-tender Skin: No lesions in the area of chief complaint Neurologic: Sensation intact distally Psychiatric: Patient is competent for consent with normal mood and affect Lymphatic: No axillary or cervical lymphadenopathy  MUSCULOSKELETAL: Left knee has varus alignment with positive crepitance and range of motion 0 to 120 degrees.  Assessment: Left knee osteoarthritis, anteromedial   Plan: Plan for Procedure(s): UNICOMPARTMENTAL KNEE  The risks benefits and alternatives were discussed with the patient including but not limited to the risks of nonoperative treatment, versus surgical intervention including infection, bleeding, nerve injury,   blood clots, cardiopulmonary complications, morbidity, mortality, among others, and they were willing to proceed.    Patient's anticipated LOS is less than 2 midnights, meeting these requirements: - Younger than 28 - Lives within 1 hour of care - Has a competent adult at home to recover with post-op recover - NO history of  - Chronic pain requiring opiods  - Diabetes  - Coronary Artery Disease  - Heart failure  - Heart attack  - Stroke  - DVT/VTE  - Cardiac arrhythmia  - Respiratory Failure/COPD  - Renal failure  - Anemia  - Advanced Liver disease        Johnny Bridge, MD Cell [phone]   01/26/2019 2:13 PM

## 2019-01-26 NOTE — Addendum Note (Signed)
Addendum  created 01/26/19 1710 by Lillia Abed, MD   Attestation recorded in Intraprocedure, Clinical Note Signed, [place] filed

## 2019-01-26 NOTE — Anesthesia Postprocedure Evaluation (Signed)
Anesthesia Post Note  Patient: Emily Wu  Procedure(s) Performed: UNICOMPARTMENTAL KNEE (Left )     Anesthesia Post Evaluation  Last Vitals:  Vitals:   01/26/19 1128 01/26/19 1700  BP: (!) 148/54 (!) 115/50  Pulse: 68 79  Resp: 18 19  Temp: 36.5 C (!) 36.3 C  SpO2: 97% 95%    Last Pain:  Vitals:   01/26/19 1700  TempSrc:   PainSc: 0-No pain    LLE Motor Response: No movement due to regional block (01/26/19 1700) LLE Sensation: No sensation (absent) (01/26/19 1700) RLE Motor Response: No movement due to regional block (01/26/19 1700) RLE Sensation: No sensation (absent) (01/26/19 1700) L Sensory Level: L2-Upper inner thigh, upper buttock (01/26/19 1700) R Sensory Level: L2-Upper inner thigh, upper buttock (01/26/19 1700)  Ray Gervasi DAVID

## 2019-01-26 NOTE — Care Plan (Signed)
Ortho Bundle Case Management Note  Patient Details  Name: Emily Wu MRN: [id number] Date of Birth: 02-16-1931   Spoke with patient prior to surgery. She plans to discharge to home with family.  States that husband will be able to assist and get meals as needed. HHPT has been arranged and OPPT to follow at facility of her choice. Rolling walker has been ordered. Patient aware and agreeable with plan.  Choice offered.                   DME Arranged:  Gilford Rile rolling DME Agency:  [facility]  HH Arranged:  PT [place] Agency:  [facility]  Additional Comments: Please contact me with any questions of if this plan should need to change.  Ladell Heads,  [place] [facility]  [phone] 01/26/2019, 1:13 PM

## 2019-01-26 NOTE — Transfer of Care (Signed)
Immediate Anesthesia Transfer of Care Note  Patient: Emily Wu  Procedure(s) Performed: UNICOMPARTMENTAL KNEE (Left )  Patient Location: PACU  Anesthesia Type:MAC, Regional and Spinal  Level of Consciousness: awake, alert , oriented and patient cooperative  Airway & Oxygen Therapy: Patient Spontanous Breathing and Patient connected to nasal cannula oxygen  Post-op Assessment: Report given to RN and Post -op Vital signs reviewed and stable  Post vital signs: Reviewed and stable  Last Vitals:  Vitals Value Taken Time  BP 115/50 01/26/2019  5:00 PM  Temp    Pulse 80 01/26/2019  5:01 PM  Resp 21 01/26/2019  5:01 PM  SpO2 95 % 01/26/2019  5:01 PM  Vitals shown include unvalidated device data.  Last Pain:  Vitals:   01/26/19 1211  TempSrc:   PainSc: 0-No pain      Patients Stated Pain Goal: 0 (35/68/61 6837)  Complications: No apparent anesthesia complications

## 2019-01-26 NOTE — Discharge Instructions (Signed)

## 2019-01-26 NOTE — Anesthesia Procedure Notes (Signed)
Anesthesia Regional Block: Adductor canal block   Pre-Anesthetic Checklist: ,, timeout performed, Correct Patient, Correct Site, Correct Laterality, Correct Procedure, Correct Position, site marked, Risks and benefits discussed,  Surgical consent,  Pre-op evaluation,  At surgeon's request and post-op pain management  Laterality: Left  Prep: chloraprep       Needles:  Injection technique: Single-shot  Needle Type: Echogenic Stimulator Needle     Needle Length: 9cm  Needle Gauge: 21   Needle insertion depth: 6 cm   Additional Needles:   Procedures:,,,, ultrasound used (permanent image in chart),,,,  Narrative:  Start time: 01/26/2019 1:15 PM End time: 01/26/2019 1:20 PM Injection made incrementally with aspirations every 5 mL.  Performed by: Personally  Anesthesiologist: Josephine Igo, MD  Additional Notes: Timeout performed. Patient sedated. Relevant anatomy ID'd using [place]. Incremental 2-91ml injection of LA with frequent aspiration. Patient tolerated procedure well.        Left Adductor Canal Block

## 2019-01-26 NOTE — Op Note (Signed)
01/26/2019  4:39 PM  PATIENT:  Emily Wu    PRE-OPERATIVE DIAGNOSIS:  Left knee primary localized osteoarthritis  POST-OPERATIVE DIAGNOSIS:  Same  PROCEDURE: LEFT unicompartmental Knee Arthroplasty  SURGEON:  Johnny Bridge, MD  PHYSICIAN ASSISTANT: Joya Gaskins, OPA-C, present and scrubbed throughout the case, critical for completion in a timely fashion, and for retraction, instrumentation, and closure.  ANESTHESIA:   Spinal with adductor canal block  ESTIMATED BLOOD LOSS: 100 mL  UNIQUE ASPECTS OF THE CASE: Severe eburnation of the medial femur, as well as a small amount of peripheral bone loss on the medial tibia.  There was extreme hypertrophic osteophyte on the patella circumferentially which I removed.  There was also significant hypertrophic osteophyte along the medial rim and trochlea.  The femur was between a small and an extra small, but I went with a small in order to minimize posterior impingement, as well as re-create an unknown amount of distal femoral bone loss.  PREOPERATIVE INDICATIONS:  ALVETTA HIDROGO is a  85 y.o. female with a diagnosis of djd left knee who failed conservative measures and elected for surgical management.    The risks benefits and alternatives were discussed with the patient preoperatively including but not limited to the risks of infection, bleeding, nerve injury, cardiopulmonary complications, blood clots, the need for revision surgery, among others, and the patient was willing to proceed.  OPERATIVE IMPLANTS: Biomet [place] mobile bearing medial compartment arthroplasty femur size small, tibia size B, bearing size 4.  OPERATIVE FINDINGS: Endstage grade 4 medial compartment osteoarthritis with extreme eburnation. No significant changes in the lateral or patellofemoral joint.  There was a small amount of erosion on the medial aspect of the lateral femoral condyle where the spine was engaging.  The ACL was intact.  OPERATIVE PROCEDURE: The  patient was brought to the operating room placed in the supine position. Spinal anesthesia was administered. IV antibiotics were given. The lower extremity was placed in the legholder and prepped and draped in usual sterile fashion.  Time out was performed.  The leg was elevated and exsanguinated and the tourniquet was inflated. Anteromedial incision was performed, and I took care to preserve the MCL. Parapatellar incision was carried out, and the osteophytes were excised, along with the medial meniscus and a small portion of the fat pad.  The extra medullary tibial cutting jig was applied, using the spoon and the 35mm G-Clamp and the 2 mm shim, and I took care to protect the anterior cruciate ligament insertion and the tibial spine. The medial collateral ligament was also protected, and I resected my proximal tibia, matching the anatomic slope.   The proximal tibial bony cut was removed in one piece, and I turned my attention to the femur.  The intramedullary femoral rod was placed using the drill, and then using the appropriate reference, I assembled the femoral jig, setting my posterior cutting block. I resected my posterior femur, used the 0 spigot for the anterior femur, and then measured my gap.   I then used the appropriate mill to match the extension gap to the flexion gap. The second milling was at a 2.  The flexion gap was 4, the extension gap was 2.  The gaps were then measured again with the appropriate feeler gauges. Once I had balanced flexion and extension gaps, I then completed the preparation of the femur.  I milled off the anterior aspect of the distal femur to prevent impingement. I also exposed the tibia, and selected  the above-named component, and then used the cutting jig to prepare the keel slot on the tibia. I also used the awl to curette out the bone to complete the preparation of the keel. The back wall was intact.  I then placed trial components, and it was found to have  excellent motion, and appropriate balance.  I then cemented the components into place, cementing the tibia first, removing all excess cement, and then cementing the femur.  All loose cement was removed.  The real polyethylene insert was applied manually, and the knee was taken through functional range of motion, and found to have excellent stability and restoration of joint motion, with excellent balance.  The wounds were irrigated copiously, and the parapatellar tissue closed with Vicryl, followed by Vicryl for the subcutaneous tissue, with routine closure with Steri-Strips and sterile gauze.  The tourniquet was released, and the patient was awakened and extubated and returned to PACU in stable and satisfactory condition. There were no complications.

## 2019-01-27 ENCOUNTER — Encounter (HOSPITAL_COMMUNITY): Payer: Self-pay | Admitting: Orthopedic Surgery

## 2019-01-27 ENCOUNTER — Other Ambulatory Visit: Payer: Self-pay

## 2019-01-27 DIAGNOSIS — Z881 Allergy status to other antibiotic agents status: Secondary | ICD-10-CM | POA: Diagnosis not present

## 2019-01-27 DIAGNOSIS — E7800 Pure hypercholesterolemia, unspecified: Secondary | ICD-10-CM | POA: Diagnosis not present

## 2019-01-27 DIAGNOSIS — Z888 Allergy status to other drugs, medicaments and biological substances status: Secondary | ICD-10-CM | POA: Diagnosis not present

## 2019-01-27 DIAGNOSIS — E039 Hypothyroidism, unspecified: Secondary | ICD-10-CM | POA: Diagnosis not present

## 2019-01-27 DIAGNOSIS — K219 Gastro-esophageal reflux disease without esophagitis: Secondary | ICD-10-CM | POA: Diagnosis not present

## 2019-01-27 DIAGNOSIS — Z8349 Family history of other endocrine, nutritional and metabolic diseases: Secondary | ICD-10-CM | POA: Diagnosis not present

## 2019-01-27 DIAGNOSIS — Z90710 Acquired absence of both cervix and uterus: Secondary | ICD-10-CM | POA: Diagnosis not present

## 2019-01-27 DIAGNOSIS — Z9049 Acquired absence of other specified parts of digestive tract: Secondary | ICD-10-CM | POA: Diagnosis not present

## 2019-01-27 DIAGNOSIS — I10 Essential (primary) hypertension: Secondary | ICD-10-CM | POA: Diagnosis not present

## 2019-01-27 DIAGNOSIS — M1712 Unilateral primary osteoarthritis, left knee: Secondary | ICD-10-CM | POA: Diagnosis not present

## 2019-01-27 DIAGNOSIS — Z79899 Other long term (current) drug therapy: Secondary | ICD-10-CM | POA: Diagnosis not present

## 2019-01-27 DIAGNOSIS — Z8541 Personal history of malignant neoplasm of cervix uteri: Secondary | ICD-10-CM | POA: Diagnosis not present

## 2019-01-27 DIAGNOSIS — Z87891 Personal history of nicotine dependence: Secondary | ICD-10-CM | POA: Diagnosis not present

## 2019-01-27 DIAGNOSIS — Z885 Allergy status to narcotic agent status: Secondary | ICD-10-CM | POA: Diagnosis not present

## 2019-01-27 DIAGNOSIS — F419 Anxiety disorder, unspecified: Secondary | ICD-10-CM | POA: Diagnosis not present

## 2019-01-27 DIAGNOSIS — Z8249 Family history of ischemic heart disease and other diseases of the circulatory system: Secondary | ICD-10-CM | POA: Diagnosis not present

## 2019-01-27 DIAGNOSIS — Z882 Allergy status to sulfonamides status: Secondary | ICD-10-CM | POA: Diagnosis not present

## 2019-01-27 DIAGNOSIS — Z87442 Personal history of urinary calculi: Secondary | ICD-10-CM | POA: Diagnosis not present

## 2019-01-27 LAB — CBC
HCT: 33.6 % — ABNORMAL LOW (ref 36.0–46.0)
Hemoglobin: 11.2 g/dL — ABNORMAL LOW (ref 12.0–15.0)
MCH: 30.9 pg (ref 26.0–34.0)
MCHC: 33.3 g/dL (ref 30.0–36.0)
MCV: 92.8 fL (ref 80.0–100.0)
Platelets: 218 10*3/uL (ref 150–400)
RBC: 3.62 MIL/uL — ABNORMAL LOW (ref 3.87–5.11)
RDW: 13.1 % (ref 11.5–15.5)
WBC: 8.6 10*3/uL (ref 4.0–10.5)
nRBC: 0 % (ref 0.0–0.2)

## 2019-01-27 LAB — BASIC METABOLIC PANEL
Anion gap: 7 (ref 5–15)
BUN: 11 mg/dL (ref 8–23)
CO2: 24 mmol/L (ref 22–32)
Calcium: 8.3 mg/dL — ABNORMAL LOW (ref 8.9–10.3)
Chloride: 105 mmol/L (ref 98–111)
Creatinine, Ser: 0.84 mg/dL (ref 0.44–1.00)
GFR calc Af Amer: >60 mL/min (ref >60)
GFR calc non Af Amer: >60 mL/min (ref >60)
Glucose, Bld: 103 mg/dL — ABNORMAL HIGH (ref 70–99)
Potassium: 3.6 mmol/L (ref 3.5–5.1)
Sodium: 136 mmol/L (ref 135–145)

## 2019-01-27 NOTE — Progress Notes (Addendum)
Patient ID: Emily Wu, female   DOB: April 29, 1931, 85 y.o.   MRN: [id number]     Subjective:  Patient reports pain as mild.  Patient in the chair and in no acute distress.  Objective:   VITALS:   Vitals:   01/26/19 1955 01/27/19 0030 01/27/19 0409 01/27/19 1423  BP: (!) 127/58 128/76 121/63 (!) 129/50  Pulse:  80 77 84  Resp: 18 20 18 16   Temp: 97.7 F (36.5 C) 98.8 F (37.1 C) 98 F (36.7 C) 98.1 F (36.7 C)  TempSrc:  Oral Oral Oral  SpO2: 94% 95% 93% (!) 88%  Weight:      Height:        ABD soft Sensation intact distally Dorsiflexion/Plantar flexion intact Incision: dressing C/D/I and no drainage   Lab Results  Component Value Date   WBC 8.6 01/27/2019   HGB 11.2 (L) 01/27/2019   HCT 33.6 (L) 01/27/2019   MCV 92.8 01/27/2019   PLT 218 01/27/2019   BMET    Component Value Date/Time   NA 136 01/27/2019 0301   K 3.6 01/27/2019 0301   CL 105 01/27/2019 0301   CO2 24 01/27/2019 0301   GLUCOSE 103 (H) 01/27/2019 0301   BUN 11 01/27/2019 0301   CREATININE 0.84 01/27/2019 0301   CREATININE 0.92 03/09/2012 0812   CALCIUM 8.3 (L) 01/27/2019 0301   GFRNONAA >60 01/27/2019 0301   GFRNONAA 60 03/09/2012 0812   GFRAA >60 01/27/2019 0301   GFRAA 69 03/09/2012 0812     Assessment/Plan: 1 Day Post-Op   Principal Problem:   Primary localized osteoarthritis of left knee Active Problems:   S/P left unicompartmental knee replacement   Advance diet Up with therapy Plan for discharge tomorrow WBAT Dry dressing PRN   Lunette Stands 01/27/2019, 4:51 PM  Discussed, seen and agree with above.  Marchia Bond, MD Cell [phone]

## 2019-01-27 NOTE — Care Management (Signed)
Patient is Ortho Bundle , Home Health services and DME have been arranged by office Case Manager, [place].

## 2019-01-27 NOTE — Evaluation (Signed)
Physical Therapy Evaluation Patient Details Name: Emily Wu MRN: [id number] DOB: 05/07/1931 Today's Date: 01/27/2019   History of Present Illness  85 y.o. female who presents for preoperative history and physical with a diagnosis of left knee anteromedial osteoarthritis. PMH:OA, cervical CA, HTN.   Clinical Impression  Pt is s/p TKA resulting in the deficits listed below (see PT Problem List). Pt is currently limited in safe mobility by pain and nausea, as well as decreased strength and endurance. Pt is currently minA for bed mobility and transfers and modA for ambulation with RW secondary to 1x LoB requiring modA to steady. During interview of home set up, pt stated that her husband would be available 24 hr/day, after ambulating she related that her husband has dementia and will not be able to provide too much assist. Pt would contact daughter to see if she could stay with her over the weekend.  Pt will benefit from skilled PT to increase their independence and safety with mobility to allow discharge to the venue listed below.      Follow Up Recommendations Follow surgeon's recommendation for DC plan and follow-up therapies    Equipment Recommendations          Precautions / Restrictions Precautions Precautions: Knee Precaution Booklet Issued: Yes (comment) Restrictions Weight Bearing Restrictions: Yes LLE Weight Bearing: Weight bearing as tolerated      Mobility  Bed Mobility Overal bed mobility: Needs Assistance Bed Mobility: Supine to Sit     Supine to sit: Min assist;HOB elevated     General bed mobility comments: min A for management of LE to floor,heavy reliance on bedrail to pull to side of bed  Transfers Overall transfer level: Needs assistance Equipment used: Rolling walker (2 wheeled) Transfers: Sit to/from Stand Sit to Stand: Min assist         General transfer comment: minA for power up and steadying from elevated bed and  toilet  Ambulation/Gait Ambulation/Gait assistance: Min assist;Mod assist Gait Distance (Feet): 20 Feet Assistive device: Rolling walker (2 wheeled) Gait Pattern/deviations: Step-to pattern;Decreased step length - right;Decreased stance time - left;Decreased weight shift to left;Shuffle;Trunk flexed;Antalgic Gait velocity: slowed Gait velocity interpretation: <1.31 ft/sec, indicative of household ambulator General Gait Details: minA for steadying with ambulation to and from bathroom, vc for proximity to RW and increased use of UE for weightbearing, 1x knee buckling requiring modA for steadying          Balance Overall balance assessment: Needs assistance Sitting-balance support: Feet supported;No upper extremity supported Sitting balance-Leahy Scale: Fair     Standing balance support: Bilateral upper extremity supported Standing balance-Leahy Scale: Poor Standing balance comment: requires RW assist for balance                             Pertinent Vitals/Pain Pain Assessment: Faces Faces Pain Scale: Hurts even more Pain Location: L knee Pain Descriptors / Indicators: Grimacing;Guarding;Throbbing Pain Intervention(s): Limited activity within patient's tolerance;Monitored during session;Repositioned    Home Living Family/patient expects to be discharged to:: Private residence Living Arrangements: Spouse/significant other(husband with dementia) Available Help at Discharge: Family;Available 24 hours/day Type of Home: House Home Access: Level entry     Home Layout: Multi-level;Able to live on main level with bedroom/bathroom Home Equipment: Shower seat - built in;Toilet riser;Bedside commode      Prior Function Level of Independence: Independent with assistive device(s)         Comments: ocassional cane use  Hand Dominance        Extremity/Trunk Assessment   Upper Extremity Assessment Upper Extremity Assessment: Generalized weakness    Lower  Extremity Assessment Lower Extremity Assessment: LLE deficits/detail LLE Deficits / Details: decreased hip and knee ROM/strength consistant with TKA LLE: Unable to fully assess due to pain    Cervical / Trunk Assessment Cervical / Trunk Assessment: Kyphotic  Communication   Communication: No difficulties  Cognition Arousal/Alertness: Awake/alert Behavior During Therapy: WFL for tasks assessed/performed Overall Cognitive Status: Within Functional Limits for tasks assessed                                        General Comments General comments (skin integrity, edema, etc.): hip dressing with moderate amount of drainage        Assessment/Plan    PT Assessment Patient needs continued PT services  PT Problem List Decreased strength;Decreased range of motion;Decreased activity tolerance;Decreased balance;Decreased mobility;Decreased coordination;Decreased knowledge of use of DME;Decreased safety awareness;Pain       PT Treatment Interventions DME instruction;Gait training;Functional mobility training;Therapeutic activities;Therapeutic exercise;Balance training;Cognitive remediation;Patient/family education    PT Goals (Current goals can be found in the Care Plan section)  Acute Rehab PT Goals Patient Stated Goal: go home PT Goal Formulation: With patient Time For Goal Achievement: 02/10/19 Potential to Achieve Goals: Fair    Frequency 7X/week   Barriers to discharge Decreased caregiver support huband would be care giver and has dementia, pt will attempt to have daughter stay with her over weekenc       AM-PAC PT "6 Clicks" Mobility  Outcome Measure Help needed turning from your back to your side while in a flat bed without using bedrails?: A Lot Help needed moving from lying on your back to sitting on the side of a flat bed without using bedrails?: A Lot Help needed moving to and from a bed to a chair (including a wheelchair)?: A Little Help needed  standing up from a chair using your arms (e.g., wheelchair or bedside chair)?: A Little Help needed to walk in hospital room?: A Lot Help needed climbing 3-5 steps with a railing? : Total 6 Click Score: 13    End of Session Equipment Utilized During Treatment: Gait belt Activity Tolerance: Other (comment)(limited by nausea) Patient left: in bed;with call bell/phone within reach;with bed alarm set Nurse Communication: Mobility status;Weight bearing status;Precautions;Other (comment)(nauseated) PT Visit Diagnosis: Unsteadiness on feet (R26.81);Other abnormalities of gait and mobility (R26.89);Muscle weakness (generalized) (M62.81);Difficulty in walking, not elsewhere classified (R26.2);Pain Pain - Right/Left: Left Pain - part of body: Knee    Time: 5852-7782 PT Time Calculation (min) (ACUTE ONLY): 32 min   Charges:   PT Evaluation $PT Eval Low Complexity: 1 Low PT Treatments $Gait Training: 8-22 mins        Moneka Mcquinn B. Migdalia Dk PT, DPT Acute Rehabilitation Services Pager [phone] Office [phone]   [place] 01/27/2019, 1:38 PM

## 2019-01-27 NOTE — Progress Notes (Addendum)
     Subjective:  Patient reports pain as mild.  She has not done much with physical therapy, and has a husband with advanced dementia at home.  She is concerned about independence.  She has also been having nausea and lightheadedness when getting up out of bed.  Objective:   VITALS:   Vitals:   01/26/19 1930 01/26/19 1955 01/27/19 0030 01/27/19 0409  BP:  (!) 127/58 128/76 121/63  Pulse: 69  80 77  Resp: (!) 24 18 20 18   Temp: 97.7 F (36.5 C) 97.7 F (36.5 C) 98.8 F (37.1 C) 98 F (36.7 C)  TempSrc:   Oral Oral  SpO2: 99% 94% 95% 93%  Weight:      Height:        Dressing is clean and dry, EHL and FHL are intact.  Lab Results  Component Value Date   WBC 8.6 01/27/2019   HGB 11.2 (L) 01/27/2019   HCT 33.6 (L) 01/27/2019   MCV 92.8 01/27/2019   PLT 218 01/27/2019   BMET    Component Value Date/Time   NA 136 01/27/2019 0301   K 3.6 01/27/2019 0301   CL 105 01/27/2019 0301   CO2 24 01/27/2019 0301   GLUCOSE 103 (H) 01/27/2019 0301   BUN 11 01/27/2019 0301   CREATININE 0.84 01/27/2019 0301   CREATININE 0.92 03/09/2012 0812   CALCIUM 8.3 (L) 01/27/2019 0301   GFRNONAA >60 01/27/2019 0301   GFRNONAA 60 03/09/2012 0812   GFRAA >60 01/27/2019 0301   GFRAA 69 03/09/2012 0812     Assessment/Plan: 1 Day Post-Op   Principal Problem:   Primary localized osteoarthritis of left knee Active Problems:   S/P left unicompartmental knee replacement   She has worked with physical therapy, and they have deemed her not safe for discharge.  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  We will plan to keep her 1 more night, IV fluids and oral fluids to help with hydration, monitor for medication side effects with dizziness, continue physical therapy, likely discharge tomorrow.  I believe that she is still classified as an outpatient, despite needing to stay 2 nights.   Emily Wu 01/27/2019, 12:50 PM   Marchia Bond, MD Cell [phone]

## 2019-01-27 NOTE — Care Management (Signed)
Patient is Ortho Bundle, discharge planning and DME arranged by office CM.

## 2019-01-27 NOTE — Progress Notes (Signed)
Initial Nutrition Assessment  DOCUMENTATION CODES:   Not applicable  INTERVENTION:    Continue Regular diet; pt declined supplements  NUTRITION DIAGNOSIS:   Increased nutrient needs related to post-op healing as evidenced by estimated needs  GOAL:   Patient will meet greater than or equal to 90% of their needs  MONITOR:   PO intake, Labs, Skin, Weight trends, I & O's  REASON FOR ASSESSMENT:   Malnutrition Screening Tool  ASSESSMENT:   85 y.o. Female who presented for preoperative history and physical with a diagnosis of left knee anteromedial osteoarthritis.   Pt s/p L unicompartmental knee arthroplasty 3/4.  RD spoke with patient today; she is resting in her recliner. Reports a good appetite; ate ~50% of her lunch. Typically eats 2-3 meals per day at home.  Pt denies recent unintentional weight loss. Declined receiving supplements here. Medications include IV Reglan.  NUTRITION - FOCUSED PHYSICAL EXAM:    Most Recent Value  Orbital Region  No depletion  Upper Arm Region  No depletion  Thoracic and Lumbar Region  No depletion  Buccal Region  No depletion  Temple Region  No depletion  Clavicle Bone Region  Mild depletion  Clavicle and Acromion Bone Region  Mild depletion  Scapular Bone Region  No depletion  Dorsal Hand  No depletion  Patellar Region  Mild depletion  Anterior Thigh Region  Mild depletion  Posterior Calf Region  Mild depletion  Edema (RD Assessment)  None  Hair  Reviewed  Eyes  Reviewed  Mouth  Reviewed  Skin  Reviewed  Nails  Reviewed     Diet Order:   Diet Order            Diet regular Room service appropriate? Yes; Fluid consistency: Thin  Diet effective now             EDUCATION NEEDS:   Not appropriate for education at this time  Skin:  Skin Assessment: Reviewed RN Assessment  Last BM:  3/4  Height:   Ht Readings from Last 1 Encounters:  01/26/19 5\' 2"  (1.575 m)   Weight:   Wt Readings from Last 1 Encounters:   01/26/19 53.1 kg   BMI:  Body mass index is 21.4 kg/m.  Estimated Nutritional Needs:   Kcal:  1300-1500  Protein:  65-80 gm  Fluid:  >/= 1.5 L  Arthur Holms, RD, LDN Pager #: [phone] After-Hours Pager #: [phone]

## 2019-01-27 NOTE — Progress Notes (Signed)
Physical Therapy Treatment Patient Details Name: Emily Wu MRN: [id number] DOB: February 14, 1931 Today's Date: 01/27/2019    History of Present Illness 85 y.o. female who presents for preoperative history and physical with a diagnosis of left knee anteromedial osteoarthritis. PMH:OA, cervical CA, HTN.     PT Comments    Pt is feeling much better after lunch and is progressing towards her goals, however she continues to have pain and decreased strength and endurance that limits her safe mobility. Pt able to complete all bed therapeutic exercise prior to going for a walk. Pt is supervision for bed mobility, min guard for transfers and min A for ambulation of 50 feet with RW. Pt expresses relief at not discharging home today.     Follow Up Recommendations  Follow surgeon's recommendation for DC plan and follow-up therapies           Precautions / Restrictions Precautions Precautions: Knee Precaution Booklet Issued: Yes (comment) Restrictions Weight Bearing Restrictions: Yes LLE Weight Bearing: Weight bearing as tolerated    Mobility  Bed Mobility Overal bed mobility: Needs Assistance Bed Mobility: Supine to Sit     Supine to sit: HOB elevated;Supervision     General bed mobility comments: supervision for safety with coming to EoB, with use of bedrail and increased time and effort  Transfers Overall transfer level: Needs assistance Equipment used: Rolling walker (2 wheeled) Transfers: Sit to/from Stand Sit to Stand: Min guard         General transfer comment: min guard for safety,  with power up and steadying   Ambulation/Gait Ambulation/Gait assistance: Min assist Gait Distance (Feet): 50 Feet Assistive device: Rolling walker (2 wheeled) Gait Pattern/deviations: Step-to pattern;Decreased step length - right;Decreased stance time - left;Decreased weight shift to left;Shuffle;Trunk flexed;Antalgic Gait velocity: slowed Gait velocity interpretation: <1.31 ft/sec,  indicative of household ambulator General Gait Details: minA for steadying with ambulation vc for keeping RW on floor, improving proximity to RW and for sequencing, pt with fatigue in UE requiring 1x standing rest break         Balance Overall balance assessment: Needs assistance Sitting-balance support: Feet supported;No upper extremity supported Sitting balance-Leahy Scale: Fair     Standing balance support: Bilateral upper extremity supported Standing balance-Leahy Scale: Poor Standing balance comment: requires RW assist for balance                            Cognition Arousal/Alertness: Awake/alert Behavior During Therapy: WFL for tasks assessed/performed Overall Cognitive Status: Within Functional Limits for tasks assessed                                        Exercises Total Joint Exercises Ankle Circles/Pumps: AROM;Both;10 reps Quad Sets: AROM;Left;10 reps Short Arc Quad: AROM;Left;10 reps;Supine Heel Slides: AROM;Left;10 reps;Supine Hip ABduction/ADduction: AROM;Left;10 reps;Supine Straight Leg Raises: AROM;Left;10 reps;Supine    General Comments        Pertinent Vitals/Pain Pain Assessment: Faces Faces Pain Scale: Hurts a little bit Pain Location: L knee Pain Descriptors / Indicators: Grimacing;Guarding;Throbbing Pain Intervention(s): Limited activity within patient's tolerance;Monitored during session;Repositioned           PT Goals (current goals can now be found in the care plan section) Acute Rehab PT Goals Patient Stated Goal: go home PT Goal Formulation: With patient Time For Goal Achievement: 02/10/19 Potential to Achieve Goals: Fair  Frequency    7X/week      PT Plan Current plan remains appropriate       AM-PAC PT "6 Clicks" Mobility   Outcome Measure  Help needed turning from your back to your side while in a flat bed without using bedrails?: A Lot Help needed moving from lying on your back to  sitting on the side of a flat bed without using bedrails?: A Lot Help needed moving to and from a bed to a chair (including a wheelchair)?: A Little Help needed standing up from a chair using your arms (e.g., wheelchair or bedside chair)?: A Little Help needed to walk in hospital room?: A Lot Help needed climbing 3-5 steps with a railing? : Total 6 Click Score: 13    End of Session Equipment Utilized During Treatment: Gait belt Activity Tolerance: Other (comment)(limited by nausea) Patient left: in bed;with call bell/phone within reach;with bed alarm set Nurse Communication: Mobility status;Weight bearing status;Precautions;Other (comment)(nauseated) PT Visit Diagnosis: Unsteadiness on feet (R26.81);Other abnormalities of gait and mobility (R26.89);Muscle weakness (generalized) (M62.81);Difficulty in walking, not elsewhere classified (R26.2);Pain Pain - Right/Left: Left Pain - part of body: Knee     Time: 5520-8022 PT Time Calculation (min) (ACUTE ONLY): 20 min  Charges:  $Therapeutic Exercise: 8-22 mins                     Mirko Tailor B. Migdalia Dk PT, DPT Acute Rehabilitation Services Pager [phone] Office [phone]    [place] 01/27/2019, 4:07 PM

## 2019-01-28 LAB — CBC
HCT: 32.8 % — ABNORMAL LOW (ref 36.0–46.0)
Hemoglobin: 10.9 g/dL — ABNORMAL LOW (ref 12.0–15.0)
MCH: 30.8 pg (ref 26.0–34.0)
MCHC: 33.2 g/dL (ref 30.0–36.0)
MCV: 92.7 fL (ref 80.0–100.0)
Platelets: 212 10*3/uL (ref 150–400)
RBC: 3.54 MIL/uL — ABNORMAL LOW (ref 3.87–5.11)
RDW: 12.9 % (ref 11.5–15.5)
WBC: 11.8 10*3/uL — ABNORMAL HIGH (ref 4.0–10.5)
nRBC: 0 % (ref 0.0–0.2)

## 2019-01-28 NOTE — Progress Notes (Signed)
Physical Therapy Treatment Patient Details Name: Emily Wu MRN: [id number] DOB: 28-Feb-1931 Today's Date: 01/28/2019    History of Present Illness 85 y.o. female who presents for preoperative history and physical with a diagnosis of left knee anteromedial osteoarthritis. PMH:OA, cervical CA, HTN.     PT Comments    Pt is making good progress towards her goals this morning. Pt with increased strength and balance and able to progress ambulation in distance and gait pattern. Pt requires min guard for transfers and ambulation. Pt will follow back after lunch to review therapeutic exercise before d/c home.    Follow Up Recommendations  Follow surgeon's recommendation for DC plan and follow-up therapies           Precautions / Restrictions Precautions Precautions: Knee Precaution Booklet Issued: Yes (comment) Restrictions Weight Bearing Restrictions: Yes LLE Weight Bearing: Weight bearing as tolerated    Mobility  Bed Mobility Overal bed mobility: Needs Assistance Bed Mobility: Supine to Sit     Supine to sit: HOB elevated;Supervision     General bed mobility comments: OOB in recliner already dressed on entry   Transfers Overall transfer level: Needs assistance Equipment used: Rolling walker (2 wheeled) Transfers: Sit to/from Stand Sit to Stand: Min guard         General transfer comment: min guard for safety,  pt with good hand placement for power up and able to self steady in RW  Ambulation/Gait Ambulation/Gait assistance: Min guard Gait Distance (Feet): 120 Feet Assistive device: Rolling walker (2 wheeled) Gait Pattern/deviations: Step-to pattern;Decreased step length - right;Decreased stance time - left;Decreased weight shift to left;Shuffle;Trunk flexed;Antalgic;Step-through pattern Gait velocity: slowed Gait velocity interpretation: <1.31 ft/sec, indicative of household ambulator General Gait Details: hands on min guard for safety, pt with improved  proximity to RW, able to progress for periods of time to step through gait pattern and utilizes decreased UE support.         Balance Overall balance assessment: Needs assistance Sitting-balance support: Feet supported;No upper extremity supported Sitting balance-Leahy Scale: Fair     Standing balance support: Bilateral upper extremity supported Standing balance-Leahy Scale: Poor Standing balance comment: requires RW assist for balance                            Cognition Arousal/Alertness: Awake/alert Behavior During Therapy: WFL for tasks assessed/performed Overall Cognitive Status: Within Functional Limits for tasks assessed                                               Pertinent Vitals/Pain Pain Assessment: Faces Faces Pain Scale: Hurts a little bit Pain Location: L knee with weightbearing Pain Descriptors / Indicators: Grimacing;Guarding;Throbbing Pain Intervention(s): Limited activity within patient's tolerance;Monitored during session;Repositioned           PT Goals (current goals can now be found in the care plan section) Acute Rehab PT Goals Patient Stated Goal: go home PT Goal Formulation: With patient Time For Goal Achievement: 02/10/19 Potential to Achieve Goals: Fair Progress towards PT goals: Progressing toward goals    Frequency    7X/week      PT Plan Current plan remains appropriate       AM-PAC PT "6 Clicks" Mobility   Outcome Measure  Help needed turning from your back to your side while in a flat bed  without using bedrails?: A Lot Help needed moving from lying on your back to sitting on the side of a flat bed without using bedrails?: A Lot Help needed moving to and from a bed to a chair (including a wheelchair)?: A Little Help needed standing up from a chair using your arms (e.g., wheelchair or bedside chair)?: A Little Help needed to walk in hospital room?: A Lot Help needed climbing 3-5 steps with a  railing? : Total 6 Click Score: 13    End of Session Equipment Utilized During Treatment: Gait belt Activity Tolerance: Other (comment)(limited by nausea) Patient left: in bed;with call bell/phone within reach;with bed alarm set Nurse Communication: Mobility status;Weight bearing status;Precautions;Other (comment)(nauseated) PT Visit Diagnosis: Unsteadiness on feet (R26.81);Other abnormalities of gait and mobility (R26.89);Muscle weakness (generalized) (M62.81);Difficulty in walking, not elsewhere classified (R26.2);Pain Pain - Right/Left: Left Pain - part of body: Knee     Time: 1191-4782 PT Time Calculation (min) (ACUTE ONLY): 21 min  Charges:  $Gait Training: 8-22 mins                     Kareem Aul B. Migdalia Dk PT, DPT Acute Rehabilitation Services Pager [phone] Office [phone]    [place] 01/28/2019, 12:06 PM

## 2019-01-28 NOTE — Discharge Summary (Signed)
Physician Discharge Summary  Patient ID: Emily Wu MRN: [id number] DOB/AGE: 83-May-1932 83 y.o.  Admit date: 01/26/2019 Discharge date: 01/28/2019  Admission Diagnoses:  Primary localized osteoarthritis of left knee  Discharge Diagnoses:  Principal Problem:   Primary localized osteoarthritis of left knee Active Problems:   S/P left unicompartmental knee replacement   Past Medical History:  Diagnosis Date  . Arthritis    "knees, shoulders, chest" (02/03/2014)  . Bruises easily   . Cervical cancer ([place]) 1965  . High cholesterol   . History of kidney stones   . Hypertension    DENIES   . Tendency toward bleeding easily [facility])     Surgeries: Procedure(s): UNICOMPARTMENTAL KNEE on 01/26/2019   Consultants (if any):   Discharged Condition: Improved  Hospital Course: Emily Wu is an 85 y.o. female who was admitted 01/26/2019 with a diagnosis of Primary localized osteoarthritis of left knee and went to the operating room on 01/26/2019 and underwent the above named procedures.    She was given perioperative antibiotics:  Anti-infectives (From admission, onward)   Start     Dose/Rate Route Frequency Ordered Stop   01/26/19 2130  ceFAZolin (ANCEF) IVPB 1 g/50 mL premix     1 g 100 mL/hr over 30 Minutes Intravenous Every 6 hours 01/26/19 1948 01/27/19 1149   01/26/19 1158  ceFAZolin (ANCEF) 2-4 GM/100ML-% IVPB    Note to Pharmacy:  Emily Wu   : cabinet override      01/26/19 1158 01/26/19 1515   01/26/19 1145  ceFAZolin (ANCEF) IVPB 2g/100 mL premix     2 g 200 mL/hr over 30 Minutes Intravenous On call to O.R. 01/26/19 1143 01/26/19 1515    .  She was given sequential compression devices, early ambulation, and aspirin for DVT prophylaxis.  She was nauseated and light headed POD 1 without home support and was converted to inpatient, given IV nausea meds and hydration, and optimized from PT standpoint.  She benefited maximally from the hospital stay and there were no  complications.    Recent vital signs:  Vitals:   01/27/19 2020 01/28/19 0352  BP: (!) 109/42 (!) 105/49  Pulse: 77 72  Resp: 16 16  Temp: (!) 97.2 F (36.2 C) 98.2 F (36.8 C)  SpO2: 92% 91%    Recent laboratory studies:  Lab Results  Component Value Date   HGB 10.9 (L) 01/28/2019   HGB 11.2 (L) 01/27/2019   HGB 12.1 01/26/2019   Lab Results  Component Value Date   WBC 11.8 (H) 01/28/2019   PLT 212 01/28/2019   Lab Results  Component Value Date   INR 1.04 12/08/2018   Lab Results  Component Value Date   NA 136 01/27/2019   K 3.6 01/27/2019   CL 105 01/27/2019   CO2 24 01/27/2019   BUN 11 01/27/2019   CREATININE 0.84 01/27/2019   GLUCOSE 103 (H) 01/27/2019    Discharge Medications:   Allergies as of 01/28/2019      Reactions   Tramadol Anaphylaxis, Other (See Comments)   Lowered Blood Pressure   Amoxicillin Rash   Promethazine Hcl Rash   Sulfa Drugs Cross Reactors Rash      Medication List    TAKE these medications   acetaminophen 650 MG CR tablet Commonly known as:  TYLENOL Take 650 mg by mouth every 8 (eight) hours as needed for pain.   amitriptyline 25 MG tablet Commonly known as:  ELAVIL TAKE 1 TABLET BY MOUTH DAILY What  changed:  when to take this   aspirin EC 325 MG tablet Take 1 tablet (325 mg total) by mouth 2 (two) times daily.   atorvastatin 20 MG tablet Commonly known as:  LIPITOR TAKE 1 TABLET BY MOUTH DAILY What changed:  when to take this   camphor-menthol lotion Commonly known as:  SARNA Apply 1 application topically daily as needed for itching.   CENTRUM SILVER PO Take 1 tablet by mouth daily.   diclofenac sodium 1 % Gel Commonly known as:  VOLTAREN Apply 2 g topically 4 (four) times daily as needed (for knee pain).   escitalopram 5 MG tablet Commonly known as:  LEXAPRO Take 1 tablet (5 mg total) by mouth daily. What changed:  when to take this   estradiol 0.1 MG/24HR patch Commonly known as:  VIVELLE-DOT APPLY 1  PATCH ONTO SKIN TWICE WEEKLY AS DIRECTED   HYDROcodone-acetaminophen 5-325 MG tablet Commonly known as:  Norco Take 1-2 tablets by mouth every 6 (six) hours as needed for moderate pain. MAXIMUM TOTAL ACETAMINOPHEN DOSE IS 4000 MG PER DAY   liothyronine 5 MCG tablet Commonly known as:  CYTOMEL TAKE TWO TABLETS BY MOUTH EVERY DAY   meloxicam 7.5 MG tablet Commonly known as:  MOBIC TAKE 1 TABLET BY MOUTH DAILY AS NEEDED FOR JOINT PAIN What changed:  See the new instructions.   ondansetron 4 MG tablet Commonly known as:  Zofran Take 1 tablet (4 mg total) by mouth every 8 (eight) hours as needed for nausea or vomiting.   polyethylene glycol packet Commonly known as:  MIRALAX / GLYCOLAX Take 17 g by mouth daily as needed.   protein supplement Powd Take 1 scoop by mouth 3 (three) times daily as needed.   sennosides-docusate sodium 8.6-50 MG tablet Commonly known as:  SENOKOT-S Take 2 tablets by mouth daily.   VITAMIN B-12 PO Take 1 tablet by mouth daily.       Diagnostic Studies: Dg Knee Left Port  Result Date: 01/26/2019 CLINICAL DATA:  Status post left knee unicompartmental prosthesis placement. EXAM: PORTABLE LEFT KNEE - 1-2 VIEW COMPARISON:  None. FINDINGS: Portable AP and lateral views of the left knee demonstrate a medial compartment prosthesis in satisfactory position and alignment. No fracture or dislocation is seen. Moderate lateral and patellofemoral spur formation is noted as well as a proximal anterior patellar enthesophyte. IMPRESSION: 1. Satisfactory postoperative appearance of a medial compartment prosthesis. 2. Moderate lateral and patellofemoral degenerative changes. Electronically Signed   By: Claudie Revering M.D.   On: 01/26/2019 17:17    Disposition: Discharge disposition: 01-Home or Self Care         Follow-up Information    Marchia Bond, MD. Go on 02/09/2019.   Specialty:  Orthopedic Surgery Why:  Your appointment has been scheduled for 3:00.  Contact  information: [place] [address] [phone]        [facility] Follow up.   Specialty:  [place] Why:  You will have 5 HHPT visits prior to follow up with Dr. Preston Fleeting information: [place] [address] [phone]        [facility]. Go on 02/09/2019.   Why:  You are schedule to start outpatient physical therapy at 9:30 am. Please arrive a few minutes early to complete your paperwork.  Contact information: [address] [place], [place] [address]   [phone]  Signed: Johnny Bridge 01/28/2019, 9:23 AM

## 2019-01-28 NOTE — Progress Notes (Addendum)
Patient ID: MAIRA CHRISTON, female   DOB: 07-06-1931, 85 y.o.   MRN: [id number]     Subjective:  Patient reports pain as mild.  Patient in bed and in no acute distress  Objective:   VITALS:   Vitals:   01/27/19 0409 01/27/19 1423 01/27/19 2020 01/28/19 0352  BP: 121/63 (!) 129/50 (!) 109/42 (!) 105/49  Pulse: 77 84 77 72  Resp: 18 16 16 16   Temp: 98 F (36.7 C) 98.1 F (36.7 C) (!) 97.2 F (36.2 C) 98.2 F (36.8 C)  TempSrc: Oral Oral Oral Oral  SpO2: 93% 90% 92% 91%  Weight:      Height:        ABD soft Sensation intact distally Dorsiflexion/Plantar flexion intact Incision: dressing C/D/I and no drainage   Lab Results  Component Value Date   WBC 11.8 (H) 01/28/2019   HGB 10.9 (L) 01/28/2019   HCT 32.8 (L) 01/28/2019   MCV 92.7 01/28/2019   PLT 212 01/28/2019   BMET    Component Value Date/Time   NA 136 01/27/2019 0301   K 3.6 01/27/2019 0301   CL 105 01/27/2019 0301   CO2 24 01/27/2019 0301   GLUCOSE 103 (H) 01/27/2019 0301   BUN 11 01/27/2019 0301   CREATININE 0.84 01/27/2019 0301   CREATININE 0.92 03/09/2012 0812   CALCIUM 8.3 (L) 01/27/2019 0301   GFRNONAA >60 01/27/2019 0301   GFRNONAA 60 03/09/2012 0812   GFRAA >60 01/27/2019 0301   GFRAA 69 03/09/2012 0812     Assessment/Plan: 2 Days Post-Op   Principal Problem:   Primary localized osteoarthritis of left knee Active Problems:   S/P left unicompartmental knee replacement   Advance diet Up with therapy DC home WBAT Dry dressing Follow up with Dr Mardelle Matte as scheduled   Lunette Stands 01/28/2019, 8:43 AM  Discussed and agree with above.    Marchia Bond, MD Cell [phone]

## 2019-01-28 NOTE — Progress Notes (Addendum)
Physical Therapy Treatment Patient Details Name: Emily Wu MRN: [id number] DOB: [birth year] Today's Date: 01/28/2019    History of Present Illness 85 y.o. female who presents for preoperative history and physical with a diagnosis of left knee anteromedial osteoarthritis. PMH:OA, cervical CA, HTN.     PT Comments    Reviewed exercise program with patient, she was able to perform all exercise with minimal cuing. Pt daughter arrived during session for pt d/c. In talking with the daughter it became apparent that pt would not have 24 hour care when she first gets home from her daughters, despite telling PT that would be the case yesterday afternoon. Educated on benefits of having an able body person the first 24 hours to assist with transfers from various surface and safe navigation of her home. Unsure if assist will be there or not.     Follow Up Recommendations  Follow surgeon's recommendation for DC plan and follow-up therapies           Precautions / Restrictions Precautions Precautions: Knee Precaution Booklet Issued: Yes (comment) Restrictions Weight Bearing Restrictions: Yes LLE Weight Bearing: Weight bearing as tolerated    Mobility  Transfers Overall transfer level: Needs assistance Equipment used: Rolling walker (2 wheeled) Transfers: Sit to/from Stand Sit to Stand: Min guard         General transfer comment: min guard for safety,  pt with good hand placement for power up and able to self steady in RW       Balance Overall balance assessment: Needs assistance Sitting-balance support: Feet supported;No upper extremity supported Sitting balance-Leahy Scale: Fair     Standing balance support: Bilateral upper extremity supported Standing balance-Leahy Scale: Poor Standing balance comment: requires RW assist for balance                            Cognition Arousal/Alertness: Awake/alert Behavior During Therapy: WFL for tasks  assessed/performed Overall Cognitive Status: Within Functional Limits for tasks assessed                                        Exercises Total Joint Exercises Ankle Circles/Pumps: AROM;Both;10 reps;Seated Quad Sets: AROM;Left;10 reps;Seated Short Arc Quad: AROM;Left;10 reps;Seated Hip ABduction/ADduction: AROM;Left;10 reps;Seated Straight Leg Raises: AROM;Left;10 reps;Seated Long Arc Quad: AROM;Left;10 reps;Seated Knee Flexion: AROM;Left;10 reps;Seated    General Comments General comments (skin integrity, edema, etc.): Pt daughter arrived for d/c, with talking to her it became apparent that pt would not have 24 hr care when she goes home except from her husband who has advanced dementia. PT highly recommended 24 hr assist when she first goes home.       Pertinent Vitals/Pain Pain Assessment: Faces Pain Score: 4  Faces Pain Scale: Hurts a little bit Pain Location: L knee with weightbearing Pain Descriptors / Indicators: Grimacing;Guarding;Throbbing Pain Intervention(s): Limited activity within patient's tolerance;Monitored during session;Repositioned;Patient requesting pain meds-RN notified           PT Goals (current goals can now be found in the care plan section) Acute Rehab PT Goals Patient Stated Goal: go home PT Goal Formulation: With patient Time For Goal Achievement: 02/10/19 Potential to Achieve Goals: Fair Progress towards PT goals: Progressing toward goals    Frequency    7X/week      PT Plan Current plan remains appropriate       AM-PAC PT "  6 Clicks" Mobility   Outcome Measure  Help needed turning from your back to your side while in a flat bed without using bedrails?: A Lot Help needed moving from lying on your back to sitting on the side of a flat bed without using bedrails?: A Lot Help needed moving to and from a bed to a chair (including a wheelchair)?: A Little Help needed standing up from a chair using your arms (e.g.,  wheelchair or bedside chair)?: A Little Help needed to walk in hospital room?: A Lot Help needed climbing 3-5 steps with a railing? : Total 6 Click Score: 13    End of Session Equipment Utilized During Treatment: Gait belt Activity Tolerance: Other (comment)(limited by nausea) Patient left: in bed;with call bell/phone within reach;with bed alarm set Nurse Communication: Mobility status;Weight bearing status;Precautions;Other (comment)(nauseated) PT Visit Diagnosis: Unsteadiness on feet (R26.81);Other abnormalities of gait and mobility (R26.89);Muscle weakness (generalized) (M62.81);Difficulty in walking, not elsewhere classified (R26.2);Pain Pain - Right/Left: Left Pain - part of body: Knee     Time: 9326-7124 PT Time Calculation (min) (ACUTE ONLY): 27 min  Charges:  $Gait Training: 8-22 mins $Therapeutic Exercise: 23-37 mins                     Zameria Vogl B. Migdalia Dk PT, DPT Acute Rehabilitation Services Pager [phone] Office [phone]    [place] 01/28/2019, 2:44 PM

## 2019-02-01 ENCOUNTER — Ambulatory Visit: Payer: Medicare Other

## 2019-02-01 DIAGNOSIS — F329 Major depressive disorder, single episode, unspecified: Secondary | ICD-10-CM | POA: Diagnosis not present

## 2019-02-01 DIAGNOSIS — Z9181 History of falling: Secondary | ICD-10-CM | POA: Diagnosis not present

## 2019-02-01 DIAGNOSIS — Z96652 Presence of left artificial knee joint: Secondary | ICD-10-CM | POA: Diagnosis not present

## 2019-02-01 DIAGNOSIS — F413 Other mixed anxiety disorders: Secondary | ICD-10-CM | POA: Diagnosis not present

## 2019-02-01 DIAGNOSIS — Z86010 Personal history of colonic polyps: Secondary | ICD-10-CM | POA: Diagnosis not present

## 2019-02-01 DIAGNOSIS — K219 Gastro-esophageal reflux disease without esophagitis: Secondary | ICD-10-CM | POA: Diagnosis not present

## 2019-02-01 DIAGNOSIS — I10 Essential (primary) hypertension: Secondary | ICD-10-CM | POA: Diagnosis not present

## 2019-02-01 DIAGNOSIS — E785 Hyperlipidemia, unspecified: Secondary | ICD-10-CM | POA: Diagnosis not present

## 2019-02-01 DIAGNOSIS — Z8541 Personal history of malignant neoplasm of cervix uteri: Secondary | ICD-10-CM | POA: Diagnosis not present

## 2019-02-01 DIAGNOSIS — E039 Hypothyroidism, unspecified: Secondary | ICD-10-CM | POA: Diagnosis not present

## 2019-02-01 DIAGNOSIS — Z471 Aftercare following joint replacement surgery: Secondary | ICD-10-CM | POA: Diagnosis not present

## 2019-02-01 DIAGNOSIS — Z87891 Personal history of nicotine dependence: Secondary | ICD-10-CM | POA: Diagnosis not present

## 2019-02-03 DIAGNOSIS — F413 Other mixed anxiety disorders: Secondary | ICD-10-CM | POA: Diagnosis not present

## 2019-02-03 DIAGNOSIS — Z471 Aftercare following joint replacement surgery: Secondary | ICD-10-CM | POA: Diagnosis not present

## 2019-02-03 DIAGNOSIS — I10 Essential (primary) hypertension: Secondary | ICD-10-CM | POA: Diagnosis not present

## 2019-02-03 DIAGNOSIS — E039 Hypothyroidism, unspecified: Secondary | ICD-10-CM | POA: Diagnosis not present

## 2019-02-03 DIAGNOSIS — F329 Major depressive disorder, single episode, unspecified: Secondary | ICD-10-CM | POA: Diagnosis not present

## 2019-02-03 DIAGNOSIS — E785 Hyperlipidemia, unspecified: Secondary | ICD-10-CM | POA: Diagnosis not present

## 2019-02-04 DIAGNOSIS — I10 Essential (primary) hypertension: Secondary | ICD-10-CM | POA: Diagnosis not present

## 2019-02-04 DIAGNOSIS — Z471 Aftercare following joint replacement surgery: Secondary | ICD-10-CM | POA: Diagnosis not present

## 2019-02-04 DIAGNOSIS — F413 Other mixed anxiety disorders: Secondary | ICD-10-CM | POA: Diagnosis not present

## 2019-02-04 DIAGNOSIS — F329 Major depressive disorder, single episode, unspecified: Secondary | ICD-10-CM | POA: Diagnosis not present

## 2019-02-04 DIAGNOSIS — E039 Hypothyroidism, unspecified: Secondary | ICD-10-CM | POA: Diagnosis not present

## 2019-02-04 DIAGNOSIS — E785 Hyperlipidemia, unspecified: Secondary | ICD-10-CM | POA: Diagnosis not present

## 2019-02-07 DIAGNOSIS — E785 Hyperlipidemia, unspecified: Secondary | ICD-10-CM | POA: Diagnosis not present

## 2019-02-07 DIAGNOSIS — E039 Hypothyroidism, unspecified: Secondary | ICD-10-CM | POA: Diagnosis not present

## 2019-02-07 DIAGNOSIS — Z471 Aftercare following joint replacement surgery: Secondary | ICD-10-CM | POA: Diagnosis not present

## 2019-02-07 DIAGNOSIS — I10 Essential (primary) hypertension: Secondary | ICD-10-CM | POA: Diagnosis not present

## 2019-02-07 DIAGNOSIS — F413 Other mixed anxiety disorders: Secondary | ICD-10-CM | POA: Diagnosis not present

## 2019-02-07 DIAGNOSIS — F329 Major depressive disorder, single episode, unspecified: Secondary | ICD-10-CM | POA: Diagnosis not present

## 2019-02-08 DIAGNOSIS — E785 Hyperlipidemia, unspecified: Secondary | ICD-10-CM | POA: Diagnosis not present

## 2019-02-08 DIAGNOSIS — E039 Hypothyroidism, unspecified: Secondary | ICD-10-CM | POA: Diagnosis not present

## 2019-02-08 DIAGNOSIS — F329 Major depressive disorder, single episode, unspecified: Secondary | ICD-10-CM | POA: Diagnosis not present

## 2019-02-08 DIAGNOSIS — Z471 Aftercare following joint replacement surgery: Secondary | ICD-10-CM | POA: Diagnosis not present

## 2019-02-08 DIAGNOSIS — I10 Essential (primary) hypertension: Secondary | ICD-10-CM | POA: Diagnosis not present

## 2019-02-08 DIAGNOSIS — F413 Other mixed anxiety disorders: Secondary | ICD-10-CM | POA: Diagnosis not present

## 2019-02-09 ENCOUNTER — Ambulatory Visit: Payer: Medicare Other | Attending: Orthopedic Surgery

## 2019-02-09 ENCOUNTER — Other Ambulatory Visit: Payer: Self-pay

## 2019-02-09 DIAGNOSIS — G8929 Other chronic pain: Secondary | ICD-10-CM | POA: Insufficient documentation

## 2019-02-09 DIAGNOSIS — M25562 Pain in left knee: Secondary | ICD-10-CM | POA: Diagnosis not present

## 2019-02-09 DIAGNOSIS — R262 Difficulty in walking, not elsewhere classified: Secondary | ICD-10-CM | POA: Insufficient documentation

## 2019-02-09 DIAGNOSIS — M6281 Muscle weakness (generalized): Secondary | ICD-10-CM | POA: Diagnosis not present

## 2019-02-09 DIAGNOSIS — M1712 Unilateral primary osteoarthritis, left knee: Secondary | ICD-10-CM | POA: Diagnosis not present

## 2019-02-09 NOTE — Therapy (Signed)
[facility] [address] [place], [address] Phone: [phone]   Fax:  [phone]  Physical Therapy Evaluation  Patient Details  Name: Emily Wu MRN: [id number] Date of Birth: 1931/10/31 Referring Provider (PT): Mardelle Matte MD   Encounter Date: 02/09/2019  PT End of Session - 02/09/19 1250    Visit Number  1    Number of Visits  13    Date for PT Re-Evaluation  03/23/19    Authorization Type  1 / 10 Medicare    PT Start Time  0945    PT Stop Time  1030    PT Time Calculation (min)  45 min    Activity Tolerance  Patient tolerated treatment well    Behavior During Therapy  [facility] for tasks assessed/performed       Past Medical History:  Diagnosis Date  . Arthritis    "knees, shoulders, chest" (02/03/2014)  . Bruises easily   . Cervical cancer ([place]) 1965  . High cholesterol   . History of kidney stones   . Hypertension    DENIES   . Tendency toward bleeding easily [facility])     Past Surgical History:  Procedure Laterality Date  . ABDOMINAL HYSTERECTOMY  1965  . APPENDECTOMY    . CHOLECYSTECTOMY    . KNEE ARTHROSCOPY Left    [facility]  . PARTIAL KNEE ARTHROPLASTY Left 01/26/2019   Procedure: UNICOMPARTMENTAL KNEE;  Surgeon: Marchia Bond, MD;  Location: [facility];  Service: Orthopedics;  Laterality: Left;  . SHOULDER OPEN ROTATOR CUFF REPAIR Bilateral 2007,  2010   [facility]  . THROAT SURGERY  2014   "arthritis hugh knot" (02/03/2014)    There were no vitals filed for this visit.   Subjective Assessment - 02/09/19 1243    Subjective  Patient reports she had a L partial knee replacement on 01/26/2019 after having increased knee pain for the previous year. Patient reports she continues to have pain but less pain overall compared to before the surgery. Patient reports difficulty with walking, ascending/desending the stairs, standing for prolonged periods of time and performing household chrores. Patient states she  practically has no pain when not weight bearing. Patient reports she would like to improve her knee motion and improve mobility while walking without the use of an AD.     Pertinent History  S/p L partial knee replacement 01/26/2019    Limitations  Walking;Standing    Diagnostic tests  none currently    Patient Stated Goals  Return to household chores    Currently in Pain?  Yes   worst: 8/10; best: 0/10   Pain Score  4     Pain Location  Knee    Pain Orientation  Left    Pain Descriptors / Indicators  Aching    Pain Type  Surgical pain    Pain Onset  More than a month ago    Pain Frequency  Intermittent         [facility] PT Assessment - 02/09/19 0956      Assessment   Medical Diagnosis  L partial knee replacement    Referring Provider (PT)  Mardelle Matte MD    Onset Date/Surgical Date  01/26/19    Hand Dominance  Right    Next MD Visit  Today     Prior Therapy  Yes - previous leg weakness      Balance Screen   Has the patient fallen in the past 6 months  No    Has the patient had a decrease in activity level because of a fear of falling?   Yes    Is the patient reluctant to leave their home because of a fear of falling?   No      Home [employer]video [employer] residence    Living Arrangements  Spouse/significant other      Prior Function   Leisure  puzzles and housework      Cognition   Overall Cognitive Status  Within Functional Limits for tasks assessed      Observation/Other Assessments   Observations  standing B knee flexion posture      Sensation   Light Touch  Appears Intact      Functional Tests   Functional tests  Sit to Stand      Sit to Stand   Comments  Unable to perform at standard chair height, performs slowly      ROM / Strength   AROM / PROM / Strength  AROM;Strength      AROM   Overall AROM Comments  Hip AROM: WNL B other than hip ER: 15 degrees    AROM Assessment Site  Knee    Right/Left Knee  Right;Left    Right Knee Extension  0     Right Knee Flexion  120    Left Knee Extension  -10    Left Knee Flexion  100   Increased pain at end range     Strength   Strength Assessment Site  Hip;Knee    Right/Left Hip  Right;Left    Right Hip Flexion  4+/5    Right Hip External Rotation   4-/5    Right Hip Internal Rotation  4/5    Right Hip ABduction  4/5    Right Hip ADduction  4/5    Left Hip Flexion  4/5    Left Hip External Rotation  3+/5    Left Hip Internal Rotation  4-/5    Left Hip ABduction  4/5    Left Hip ADduction  4-/5    Right/Left Knee  Right;Left    Right Knee Flexion  5/5    Right Knee Extension  5/5    Left Knee Flexion  4+/5    Left Knee Extension  4+/5      Palpation   Patella mobility  unable to asses considerably secondary to bandage    Palpation comment  No TTP along hamstring or quadriceps      Ambulation/Gait   Assistive device  Rolling walker    Gait Pattern  Decreased arm swing - right;Decreased arm swing - left;Step-through pattern;Poor foot clearance - left;Poor foot clearance - right   Decreased heel strike, increased forward thoracic lean   Gait velocity  .7 m/s    Stairs  Yes    Stairs Assistance  5: Supervision    Stair Management Technique  Two rails;Step to pattern    Number of Stairs  4      Balance   Balance Assessed  Yes      Static Standing Balance   Static Standing - Balance Support  Bilateral upper extremity supported       Objective measurements completed on examination: See above findings.   TREATMENT Therapeutic Exercise Sit to stands -- x3, x 5 Standing marches in standing -- x 15 Seated ball roll outs -- x 10   Performed to address LE weakness and knee AROM with patient positioned in  standing. Patient demonstrates no increase in pain at the end of the session     PT Education - 02/09/19 1249    Education Details  HEP: sit to stands, standing marches, knee AROM in sitting    Person(s) Educated  Patient    Methods  Explanation;Demonstration;Handout     Comprehension  Returned demonstration;Verbalized understanding          PT Long Term Goals - 02/09/19 1306      PT LONG TERM GOAL #1   Title  Patient will demonstrate independence with home exercises for strengthening and balance to prevent future falls and improve confidence with walking on level, uneven surfaces.    Baseline  Dependent form/technique    Time  6    Period  Weeks    Status  New    Target Date  03/23/19      PT LONG TERM GOAL #2   Title  Patient will improve 5xSTS to under 12 sec to improvement functional strength and allow for improvement to start walking from sitting    Baseline  20sec    Time  6    Period  Weeks    Status  New    Target Date  03/23/19      PT LONG TERM GOAL #3   Title  Patient will improvement ability to perform household chores without difficulty or increase in pain to return to prior level of function.    Baseline  Unable to perform    Time  6    Period  Weeks    Status  New    Target Date  03/23/19      PT LONG TERM GOAL #4   Title  Patient will be able to ambulate over 1 m/s without an AD to improve ability to ambulate in community settings     Baseline  .31m/s    Time  6    Period  Weeks    Status  New    Target Date  03/23/19             Plan - 02/09/19 1250    Clinical Impression Statement  Patient is a 85 yo right hand dominant female presenting with increased difficulty with walking, standing, and performing weight bearing activity/ pain s/p L partial knee replacement. Patient demonstrates poor LE strength, coordination, and endurance with exercises performed. Patient demonstrates limited AROM and patient will benefit from further skilled therapy to return to prior level of function.     Personal Factors and Comorbidities  Age;Sex;Comorbidity 3+    Comorbidities  knee OA, S/P knee partial TKA, decreased GFR    Examination-Activity Limitations  Squat;Stairs;Stand;Locomotion Level;Carry    Examination-Participation  Restrictions  Laundry;Volunteer;Community Activity    Stability/Clinical Decision Making  Evolving/Moderate complexity    Clinical Decision Making  Moderate    Rehab Potential  Good    PT Frequency  2x / week    PT Duration  6 weeks    PT Treatment/Interventions  Patient/family education;Therapeutic activities;Therapeutic exercise;ADLs/Self Care Home Management;Cryotherapy;Ultrasound;Moist Heat;Iontophoresis 4mg /ml Dexamethasone;Electrical Stimulation;Gait training;Neuromuscular re-education;Manual techniques;Dry needling;Passive range of motion;Joint Manipulations    PT Next Visit Plan  continue improvement in knee flexion, strength    PT Home Exercise Plan  See education section    Consulted and Agree with Plan of Care  Patient       Patient will benefit from skilled therapeutic intervention in order to improve the following deficits and impairments:  Abnormal gait, Pain, Impaired sensation, Decreased coordination,  Decreased mobility, Increased muscle spasms, Decreased endurance, Decreased activity tolerance, Decreased range of motion, Decreased strength, Decreased balance  Visit Diagnosis: Difficulty in walking, not elsewhere classified  Muscle weakness (generalized)  Chronic pain of left knee     Problem List Patient Active Problem List   Diagnosis Date Noted  . S/P left unicompartmental knee replacement 01/26/2019  . Hospital discharge follow-up 12/26/2018  . Incidental pulmonary nodule, > 42mm and < 39mm 12/26/2018  . Decreased GFR 12/26/2018  . SOB (shortness of breath) 12/08/2018  . Left bundle branch block (LBBB) determined by electrocardiography 12/02/2018  . GERD (gastroesophageal reflux disease) 09/29/2018  . Anxiety disorder 03/25/2018  . Postprandial diarrhea 04/18/2017  . Chronic pain of left knee 05/27/2016  . Exertional dyspnea 04/15/2016  . Left carotid bruit 04/14/2016  . Menopausal hot flushes 08/07/2015  . H/O malignant neoplasm of skin 06/25/2015  .  Hypothyroidism 01/31/2015  . History of cervical cancer 01/10/2015  . Preoperative evaluation of a medical condition to rule out surgical contraindications (TAR required) 01/10/2015  . Hyperlipidemia 02/03/2014  . Medicare annual wellness visit, subsequent 01/19/2014  . History of IBS 01/19/2014  . S/P breast augmentation 01/19/2014  . Primary localized osteoarthritis of left knee 07/06/2012  . Colon polyps 01/30/2012  . Low back pain potentially associated with radiculopathy 01/30/2012    Blythe Stanford, PT DPT 02/09/2019, 1:30 PM  [facility] [address] [place], [address] Phone: [phone]   Fax:  [phone]  Name: JATIA MUSA MRN: [id number] Date of Birth: 05-05-1931

## 2019-02-14 ENCOUNTER — Ambulatory Visit: Payer: Medicare Other

## 2019-02-15 ENCOUNTER — Ambulatory Visit: Payer: Medicare Other

## 2019-02-15 ENCOUNTER — Other Ambulatory Visit: Payer: Self-pay

## 2019-02-15 DIAGNOSIS — R262 Difficulty in walking, not elsewhere classified: Secondary | ICD-10-CM

## 2019-02-15 DIAGNOSIS — G8929 Other chronic pain: Secondary | ICD-10-CM

## 2019-02-15 DIAGNOSIS — M6281 Muscle weakness (generalized): Secondary | ICD-10-CM | POA: Diagnosis not present

## 2019-02-15 DIAGNOSIS — M25562 Pain in left knee: Secondary | ICD-10-CM | POA: Diagnosis not present

## 2019-02-15 NOTE — Therapy (Signed)
[facility] [address] [place], [address] Phone: [phone]   Fax:  [phone]  Physical Therapy Treatment  Patient Details  Name: Emily Wu MRN: [id number] Date of Birth: [birth year] Referring Provider (PT): Mardelle Matte MD   Encounter Date: 02/15/2019  PT End of Session - 02/15/19 1103    Visit Number  2    Number of Visits  13    Date for PT Re-Evaluation  03/23/19    Authorization Type  2 / 10 Medicare    PT Start Time  1103    PT Stop Time  1148    PT Time Calculation (min)  45 min    Activity Tolerance  Patient tolerated treatment well    Behavior During Therapy  [facility] for tasks assessed/performed       Past Medical History:  Diagnosis Date  . Arthritis    "knees, shoulders, chest" (02/03/2014)  . Bruises easily   . Cervical cancer ([place]) 1965  . High cholesterol   . History of kidney stones   . Hypertension    DENIES   . Tendency toward bleeding easily [facility])     Past Surgical History:  Procedure Laterality Date  . ABDOMINAL HYSTERECTOMY  1965  . APPENDECTOMY    . CHOLECYSTECTOMY    . KNEE ARTHROSCOPY Left    [facility]  . PARTIAL KNEE ARTHROPLASTY Left 01/26/2019   Procedure: UNICOMPARTMENTAL KNEE;  Surgeon: Marchia Bond, MD;  Location: [facility];  Service: Orthopedics;  Laterality: Left;  . SHOULDER OPEN ROTATOR CUFF REPAIR Bilateral 2007,  2010   [facility]  . THROAT SURGERY  2014   "arthritis hugh knot" (02/03/2014)    There were no vitals filed for this visit.  Subjective Assessment - 02/15/19 1103    Subjective  L knee is doing great. It hurt her yesterday off and on. Ok today.     Pertinent History  S/p L partial knee replacement 01/26/2019    Limitations  Walking;Standing    Diagnostic tests  none currently    Patient Stated Goals  Return to household chores    Currently in Pain?  No/denies    Pain Score  0-No pain    Pain Onset  More than a month ago                                PT Education - 02/15/19 1130    Education Details  ther-ex, HEP    Person(s) Educated  Patient    Methods  Explanation;Demonstration;Tactile cues;Verbal cues;Handout    Comprehension  Returned demonstration;Verbalized understanding         Objective   Steri strips; incision healing well  Medbridge Access Code: [email or web address]   Denies blood pressure problems.    Therapeutic Exercise  Seated L knee AROM at start of session:   Extension -7 degrees  Flexion: 90 degrees  Seated L knee flexion AAROM 10x4 with PT  105 degrees seated L knee flexion AROM afterwards  Supine quad set 10x5 seconds 2 sets  Supine SLR L hip flexion 10x3. Cues for no quad lag  Seated L knee extension AROM  -6 degrees  Standing ankle DF/PF on rocker board 2 minutes   Standing marches in standing with R UE assist -- x 15. Fatigue  Sit to stands with B UE assist to stand, and light touch to no UE assist to sit  -- x3,  x 5, x5  Improved exercise technique, movement at target joints, use of target muscles after mod verbal, visual, tactile cues.    Performed to address LE weakness and knee AROM. Patient demonstrates no increase in pain at the end of the session   Manual therapy  Supine STM L hamstrings with leg propped on longitudinal pillow  Decreased L lateral hamstrings tension palpated afterwards.   Response to treatment Improved seated L knee flexion AROM after exercises. Pt tolerated session well without aggravation of symptoms.   Clinical impression Worked on improving L knee flexion and extension AROM as well as worked on LE strengthening to decrease difficulty ambulating and performing standing tasks. Cues needed for femoral control with sit <> stand exercises. Stiff end feel with knee flexion and extension ROM. Added supine quad set and SLR flexion to promote knee extension as well as improve quad strength with gait. Pt tolerated session  well without aggravation of symptoms. Pt will benefit from continued skilled physical therapy services to improve ROM, strength, and function.               PT Long Term Goals - 02/09/19 1306      PT LONG TERM GOAL #1   Title  Patient will demonstrate independence with home exercises for strengthening and balance to prevent future falls and improve confidence with walking on level, uneven surfaces.    Baseline  Dependent form/technique    Time  6    Period  Weeks    Status  New    Target Date  03/23/19      PT LONG TERM GOAL #2   Title  Patient will improve 5xSTS to under 12 sec to improvement functional strength and allow for improvement to start walking from sitting    Baseline  20sec    Time  6    Period  Weeks    Status  New    Target Date  03/23/19      PT LONG TERM GOAL #3   Title  Patient will improvement ability to perform household chores without difficulty or increase in pain to return to prior level of function.    Baseline  Unable to perform    Time  6    Period  Weeks    Status  New    Target Date  03/23/19      PT LONG TERM GOAL #4   Title  Patient will be able to ambulate over 1 m/s without an AD to improve ability to ambulate in community settings     Baseline  .60m/s    Time  6    Period  Weeks    Status  New    Target Date  03/23/19            Plan - 02/15/19 1131    Clinical Impression Statement  Worked on improving L knee flexion and extension AROM as well as worked on LE strengthening to decrease difficulty ambulating and performing standing tasks. Cues needed for femoral control with sit <> stand exercises. Stiff end feel with knee flexion and extension ROM. Added supine quad set and SLR flexion to promote knee extension as well as improve quad strength with gait. Pt tolerated session well without aggravation of symptoms. Pt will benefit from continued skilled physical therapy services to improve ROM, strength, and function.     Personal  Factors and Comorbidities  Age;Sex;Comorbidity 3+    Comorbidities  knee OA, S/P knee partial TKA, decreased  GFR    Examination-Activity Limitations  Squat;Stairs;Stand;Locomotion Level;Carry    Examination-Participation Restrictions  Laundry;Volunteer;Community Activity    Stability/Clinical Decision Making  Evolving/Moderate complexity    Rehab Potential  Good    PT Frequency  2x / week    PT Duration  6 weeks    PT Treatment/Interventions  Patient/family education;Therapeutic activities;Therapeutic exercise;ADLs/Self Care Home Management;Cryotherapy;Ultrasound;Moist Heat;Iontophoresis 4mg /ml Dexamethasone;Electrical Stimulation;Gait training;Neuromuscular re-education;Manual techniques;Dry needling;Passive range of motion;Joint Manipulations    PT Next Visit Plan  continue improvement in knee flexion, strength    PT Home Exercise Plan  See education section    Consulted and Agree with Plan of Care  Patient       Patient will benefit from skilled therapeutic intervention in order to improve the following deficits and impairments:  Abnormal gait, Pain, Impaired sensation, Decreased coordination, Decreased mobility, Increased muscle spasms, Decreased endurance, Decreased activity tolerance, Decreased range of motion, Decreased strength, Decreased balance  Visit Diagnosis: Difficulty in walking, not elsewhere classified  Muscle weakness (generalized)  Chronic pain of left knee     Problem List Patient Active Problem List   Diagnosis Date Noted  . S/P left unicompartmental knee replacement 01/26/2019  . Hospital discharge follow-up 12/26/2018  . Incidental pulmonary nodule, > 50mm and < 78mm 12/26/2018  . Decreased GFR 12/26/2018  . SOB (shortness of breath) 12/08/2018  . Left bundle branch block (LBBB) determined by electrocardiography 12/02/2018  . GERD (gastroesophageal reflux disease) 09/29/2018  . Anxiety disorder 03/25/2018  . Postprandial diarrhea 04/18/2017  . Chronic pain  of left knee 05/27/2016  . Exertional dyspnea 04/15/2016  . Left carotid bruit 04/14/2016  . Menopausal hot flushes 08/07/2015  . H/O malignant neoplasm of skin 06/25/2015  . Hypothyroidism 01/31/2015  . History of cervical cancer 01/10/2015  . Preoperative evaluation of a medical condition to rule out surgical contraindications (TAR required) 01/10/2015  . Hyperlipidemia 02/03/2014  . Medicare annual wellness visit, subsequent 01/19/2014  . History of IBS 01/19/2014  . S/P breast augmentation 01/19/2014  . Primary localized osteoarthritis of left knee 07/06/2012  . Colon polyps 01/30/2012  . Low back pain potentially associated with radiculopathy 01/30/2012   Joneen Boers PT, DPT   02/15/2019, 12:03 PM  [facility] [address] [place], [address] Phone: [phone]   Fax:  [phone]  Name: KEIRRA ZEIMET MRN: [id number] Date of Birth: 04-20-1931

## 2019-02-15 NOTE — Patient Instructions (Signed)
Medbridge Access Code: [email or web address]    Supine quad set 10x3 with 5 second holds   Supine SLR hip flexion 10x3

## 2019-02-16 ENCOUNTER — Ambulatory Visit: Payer: Medicare Other

## 2019-02-17 ENCOUNTER — Other Ambulatory Visit: Payer: Self-pay

## 2019-02-17 ENCOUNTER — Ambulatory Visit: Payer: Medicare Other

## 2019-02-17 DIAGNOSIS — M6281 Muscle weakness (generalized): Secondary | ICD-10-CM | POA: Diagnosis not present

## 2019-02-17 DIAGNOSIS — M25562 Pain in left knee: Secondary | ICD-10-CM | POA: Diagnosis not present

## 2019-02-17 DIAGNOSIS — G8929 Other chronic pain: Secondary | ICD-10-CM | POA: Diagnosis not present

## 2019-02-17 DIAGNOSIS — R262 Difficulty in walking, not elsewhere classified: Secondary | ICD-10-CM | POA: Diagnosis not present

## 2019-02-17 NOTE — Therapy (Signed)
[facility] [address] [place], [address] Phone: [phone]   Fax:  [phone]  Physical Therapy Treatment  Patient Details  Name: Emily Wu MRN: [id number] Date of Birth: [birth year] Referring Provider (PT): Mardelle Matte MD   Encounter Date: 02/17/2019  PT End of Session - 02/17/19 1358    Visit Number  3    Number of Visits  13    Date for PT Re-Evaluation  03/23/19    Authorization Type  3 / 10 Medicare    PT Start Time  9702    PT Stop Time  1444    PT Time Calculation (min)  45 min    Activity Tolerance  Patient tolerated treatment well    Behavior During Therapy  [facility] for tasks assessed/performed       Past Medical History:  Diagnosis Date  . Arthritis    "knees, shoulders, chest" (02/03/2014)  . Bruises easily   . Cervical cancer ([place]) 1965  . High cholesterol   . History of kidney stones   . Hypertension    DENIES   . Tendency toward bleeding easily [facility])     Past Surgical History:  Procedure Laterality Date  . ABDOMINAL HYSTERECTOMY  1965  . APPENDECTOMY    . CHOLECYSTECTOMY    . KNEE ARTHROSCOPY Left    [facility]  . PARTIAL KNEE ARTHROPLASTY Left 01/26/2019   Procedure: UNICOMPARTMENTAL KNEE;  Surgeon: Marchia Bond, MD;  Location: [place];  Service: Orthopedics;  Laterality: Left;  . SHOULDER OPEN ROTATOR CUFF REPAIR Bilateral 2007,  2010   [facility]  . THROAT SURGERY  2014   "arthritis hugh knot" (02/03/2014)    There were no vitals filed for this visit.  Subjective Assessment - 02/17/19 1400    Subjective  L knee is ok.     Pertinent History  S/p L partial knee replacement 01/26/2019    Limitations  Walking;Standing    Diagnostic tests  none currently    Patient Stated Goals  Return to household chores    Currently in Pain?  Yes    Pain Score  3    medial L knee   Pain Location  Knee    Pain Onset  More than a month ago                               PT  Education - 02/17/19 1422    Education Details  ther-ex    Person(s) Educated  Patient    Methods  Explanation;Demonstration;Tactile cues;Verbal cues    Comprehension  Returned demonstration;Verbalized understanding         Objective    Medbridge Access Code: [email or web address]   Denies blood pressure problems.   3 weeks post op  Therapeutic Exercise  Seated L knee AROM at start of session:              Extension -13 degrees             Flexion: 95 degrees  Seated L knee flexion AAROM 10x4 with PT             105 degrees seated L knee flexion AROM afterwards  Supine quad set 10x5 seconds 3 sets with overpressure  Supine SLR L hip flexion 10x2. Cues for no quad lag   Seated L knee extension AROM             -8  degrees  Sit to stands with B UE assist to stand, and  no UE assist to sit  5x3  Standing ankle DF/PF on rocker board 2 minutes to promote knee extension  Standing mini squats with light touch to no UE assist   10x2  Standing hip abduction with one UE assist   R 10x, then 2x5  L 10x, then 2x5    Improved exercise technique, movement at target joints, use of target muscles after min to mod verbal, visual, tactile cues.    Performed to address LE weakness and knee AROM. Patient demonstrates no increase in pain at the end of the session   Manual therapy  Supine STM L hamstrings with leg propped on longitudinal pillow             Decreased L lateral hamstrings tension palpated afterwards.   Response to treatment Improved seated L knee flexion and extension AROM after exercises. Pt tolerated session well without aggravation of symptoms.   Clinical impression Continued working on improving L knee flexion and extension AROM to decrease stiffness and improve ability to ambulate. Improved ROM after treatment. L knee extension AROM > R observed. Also worked on LE strengthening to promote ability to ambulate and perform standing tasks such as transfers  and stair negotiation with less difficulty. Pt tolerated session without aggravation of symptoms. Pt will benefit from continued skilled physical therapy services to improve ROM, strength, gait, and function.        PT Long Term Goals - 02/09/19 1306      PT LONG TERM GOAL #1   Title  Patient will demonstrate independence with home exercises for strengthening and balance to prevent future falls and improve confidence with walking on level, uneven surfaces.    Baseline  Dependent form/technique    Time  6    Period  Weeks    Status  New    Target Date  03/23/19      PT LONG TERM GOAL #2   Title  Patient will improve 5xSTS to under 12 sec to improvement functional strength and allow for improvement to start walking from sitting    Baseline  20sec    Time  6    Period  Weeks    Status  New    Target Date  03/23/19      PT LONG TERM GOAL #3   Title  Patient will improvement ability to perform household chores without difficulty or increase in pain to return to prior level of function.    Baseline  Unable to perform    Time  6    Period  Weeks    Status  New    Target Date  03/23/19      PT LONG TERM GOAL #4   Title  Patient will be able to ambulate over 1 m/s without an AD to improve ability to ambulate in community settings     Baseline  .14m/s    Time  6    Period  Weeks    Status  New    Target Date  03/23/19            Plan - 02/17/19 1357    Clinical Impression Statement  Continued working on improving L knee flexion and extension AROM to decrease stiffness and improve ability to ambulate. Improved ROM after treatment. L knee extension AROM > R observed. Also worked on LE strengthening to promote ability to ambulate and perform standing tasks such as transfers  and stair negotiation with less difficulty. Pt tolerated session without aggravation of symptoms. Pt will benefit from continued skilled physical therapy services to improve ROM, strength, gait, and function.      Personal Factors and Comorbidities  Age;Sex;Comorbidity 3+    Comorbidities  knee OA, S/P knee partial TKA, decreased GFR    Examination-Activity Limitations  Squat;Stairs;Stand;Locomotion Level;Carry    Examination-Participation Restrictions  Laundry;Volunteer;Community Activity    Stability/Clinical Decision Making  Evolving/Moderate complexity    Rehab Potential  Good    PT Frequency  2x / week    PT Duration  6 weeks    PT Treatment/Interventions  Patient/family education;Therapeutic activities;Therapeutic exercise;ADLs/Self Care Home Management;Cryotherapy;Ultrasound;Moist Heat;Iontophoresis 4mg /ml Dexamethasone;Electrical Stimulation;Gait training;Neuromuscular re-education;Manual techniques;Dry needling;Passive range of motion;Joint Manipulations    PT Next Visit Plan  continue improvement in knee flexion, strength    PT Home Exercise Plan  See education section    Consulted and Agree with Plan of Care  Patient       Patient will benefit from skilled therapeutic intervention in order to improve the following deficits and impairments:  Abnormal gait, Pain, Impaired sensation, Decreased coordination, Decreased mobility, Increased muscle spasms, Decreased endurance, Decreased activity tolerance, Decreased range of motion, Decreased strength, Decreased balance  Visit Diagnosis: Difficulty in walking, not elsewhere classified  Muscle weakness (generalized)  Chronic pain of left knee     Problem List Patient Active Problem List   Diagnosis Date Noted  . S/P left unicompartmental knee replacement 01/26/2019  . Hospital discharge follow-up 12/26/2018  . Incidental pulmonary nodule, > 34mm and < 34mm 12/26/2018  . Decreased GFR 12/26/2018  . SOB (shortness of breath) 12/08/2018  . Left bundle branch block (LBBB) determined by electrocardiography 12/02/2018  . GERD (gastroesophageal reflux disease) 09/29/2018  . Anxiety disorder 03/25/2018  . Postprandial diarrhea 04/18/2017  .  Chronic pain of left knee 05/27/2016  . Exertional dyspnea 04/15/2016  . Left carotid bruit 04/14/2016  . Menopausal hot flushes 08/07/2015  . H/O malignant neoplasm of skin 06/25/2015  . Hypothyroidism 01/31/2015  . History of cervical cancer 01/10/2015  . Preoperative evaluation of a medical condition to rule out surgical contraindications (TAR required) 01/10/2015  . Hyperlipidemia 02/03/2014  . Medicare annual wellness visit, subsequent 01/19/2014  . History of IBS 01/19/2014  . S/P breast augmentation 01/19/2014  . Primary localized osteoarthritis of left knee 07/06/2012  . Colon polyps 01/30/2012  . Low back pain potentially associated with radiculopathy 01/30/2012    Joneen Boers PT, DPT   02/17/2019, 5:13 PM  [facility] [address] [place], [address] Phone: [phone]   Fax:  [phone]  Name: ROSCHELLE CALANDRA MRN: [id number] Date of Birth: 05/28/1931

## 2019-02-21 ENCOUNTER — Other Ambulatory Visit: Payer: Self-pay

## 2019-02-21 ENCOUNTER — Ambulatory Visit: Payer: Medicare Other

## 2019-02-21 DIAGNOSIS — G8929 Other chronic pain: Secondary | ICD-10-CM | POA: Diagnosis not present

## 2019-02-21 DIAGNOSIS — M25562 Pain in left knee: Secondary | ICD-10-CM

## 2019-02-21 DIAGNOSIS — R262 Difficulty in walking, not elsewhere classified: Secondary | ICD-10-CM

## 2019-02-21 DIAGNOSIS — M6281 Muscle weakness (generalized): Secondary | ICD-10-CM | POA: Diagnosis not present

## 2019-02-21 NOTE — Therapy (Signed)
[facility] [address] [place], [address] Phone: [phone]   Fax:  [phone]  Physical Therapy Treatment  Patient Details  Name: Emily Wu MRN: [id number] Date of Birth: December 14, 1930 Referring Provider (PT): Mardelle Matte MD   Encounter Date: 02/21/2019  PT End of Session - 02/21/19 1401    Visit Number  4    Number of Visits  13    Date for PT Re-Evaluation  03/23/19    Authorization Type  4/ 10 Medicare    PT Start Time  1401    PT Stop Time  1443    PT Time Calculation (min)  42 min    Activity Tolerance  Patient tolerated treatment well    Behavior During Therapy  [facility] for tasks assessed/performed       Past Medical History:  Diagnosis Date  . Arthritis    "knees, shoulders, chest" (02/03/2014)  . Bruises easily   . Cervical cancer ([place]) 1965  . High cholesterol   . History of kidney stones   . Hypertension    DENIES   . Tendency toward bleeding easily [facility])     Past Surgical History:  Procedure Laterality Date  . ABDOMINAL HYSTERECTOMY  1965  . APPENDECTOMY    . CHOLECYSTECTOMY    . KNEE ARTHROSCOPY Left    [place]  . PARTIAL KNEE ARTHROPLASTY Left 01/26/2019   Procedure: UNICOMPARTMENTAL KNEE;  Surgeon: Marchia Bond, MD;  Location: [place];  Service: Orthopedics;  Laterality: Left;  . SHOULDER OPEN ROTATOR CUFF REPAIR Bilateral 2007,  2010   [place]  . THROAT SURGERY  2014   "arthritis hugh knot" (02/03/2014)    There were no vitals filed for this visit.  Subjective Assessment - 02/21/19 1402    Subjective  No pain.     Pertinent History  S/p L partial knee replacement 01/26/2019    Limitations  Walking;Standing    Diagnostic tests  none currently    Patient Stated Goals  Return to household chores    Currently in Pain?  No/denies    Pain Score  0-No pain    Pain Onset  More than a month ago                               PT Education - 02/21/19 1423    Education Details  ther-ex    [employer]) Educated  Patient    Methods  Explanation;Demonstration;Tactile cues;Verbal cues    Comprehension  Returned demonstration;Verbalized understanding         Objective    MedbridgeAccess Code: [email or web address]  Denies blood pressure problems.  3 - 4 weeks post op  Therapeutic Exercise  Seated L knee AROM at start of session:  Extension -12 degrees Flexion: 101 degrees  Seated L knee flexion AAROM 10x4 with PT 111 degrees seated L knee flexion AROM afterwards  Supine quad set 10x5 seconds 3 sets with overpressure  Seated L knee extension   -7 degrees after aforementioned exercises.   Forward step up onto Air ex pad with L LE and R UE assist 10x2, then 5x  Sit to stand with B UE assist then stand to sit without UE assist 1x, then no UE assist 4x, then with B UE to no UE assist 5x   Standing hip abduction with one UE assist              R  10x2             L 10x2 Side stepping 32 ft to the R and 20 ft to the L. Rest break secondary to fatigue. Performed to promote glute med muscle strengthening.    Improved exercise technique, movement at target joints, use of target muscles after min to mod verbal, visual, tactile cues.   Performed to address LE weakness and knee AROM.Patient demonstrates no increase in pain at the end of the session   Manual therapy  Supine STM L hamstrings with leg propped on longitudinal pillow Decreased L lateral hamstrings tension palpated afterwards.   Response to treatment Improved seated L knee flexion and extension AROM after exercises. Pt tolerated session well without aggravation of symptoms.   Clinical impression Improving L knee flexion AROM. Able to maintain L knee extension ROM gains from previous session. Possible challenge to maintain and improve L knee extension ROM gains includes limited R knee extension ROM such as in standing  which may affect pt to promote L knee extension position in that position. Continued working on L LE strength as well to promote ability to perform standing tasks with less difficulty. Pt will benefit from continued skilled physical therapy services to improve ROM, strength, balance and function.      PT Long Term Goals - 02/09/19 1306      PT LONG TERM GOAL #1   Title  Patient will demonstrate independence with home exercises for strengthening and balance to prevent future falls and improve confidence with walking on level, uneven surfaces.    Baseline  Dependent form/technique    Time  6    Period  Weeks    Status  New    Target Date  03/23/19      PT LONG TERM GOAL #2   Title  Patient will improve 5xSTS to under 12 sec to improvement functional strength and allow for improvement to start walking from sitting    Baseline  20sec    Time  6    Period  Weeks    Status  New    Target Date  03/23/19      PT LONG TERM GOAL #3   Title  Patient will improvement ability to perform household chores without difficulty or increase in pain to return to prior level of function.    Baseline  Unable to perform    Time  6    Period  Weeks    Status  New    Target Date  03/23/19      PT LONG TERM GOAL #4   Title  Patient will be able to ambulate over 1 m/s without an AD to improve ability to ambulate in community settings     Baseline  .75m/s    Time  6    Period  Weeks    Status  New    Target Date  03/23/19            Plan - 02/21/19 1400    Clinical Impression Statement  Improving L knee flexion AROM. Able to maintain L knee extension ROM gains from previous session. Possible challenge to maintain and improve L knee extension ROM gains includes limited R knee extension ROM such as in standing which may affect pt to promote L knee extension position in that position. Continued working on L LE strength as well to promote ability to perform standing tasks with less difficulty. Pt will  benefit from continued skilled physical therapy services to  improve ROM, strength, balance and function.     Personal Factors and Comorbidities  Age;Sex;Comorbidity 3+    Comorbidities  knee OA, S/P knee partial TKA, decreased GFR    Examination-Activity Limitations  Squat;Stairs;Stand;Locomotion Level;Carry    Examination-Participation Restrictions  Laundry;Volunteer;Community Activity    Stability/Clinical Decision Making  Evolving/Moderate complexity    Rehab Potential  Good    PT Frequency  2x / week    PT Duration  6 weeks    PT Treatment/Interventions  Patient/family education;Therapeutic activities;Therapeutic exercise;ADLs/Self Care Home Management;Cryotherapy;Ultrasound;Moist Heat;Iontophoresis 4mg /ml Dexamethasone;Electrical Stimulation;Gait training;Neuromuscular re-education;Manual techniques;Dry needling;Passive range of motion;Joint Manipulations    PT Next Visit Plan  continue improvement in knee flexion, strength    PT Home Exercise Plan  See education section    Consulted and Agree with Plan of Care  Patient       Patient will benefit from skilled therapeutic intervention in order to improve the following deficits and impairments:  Abnormal gait, Pain, Impaired sensation, Decreased coordination, Decreased mobility, Increased muscle spasms, Decreased endurance, Decreased activity tolerance, Decreased range of motion, Decreased strength, Decreased balance  Visit Diagnosis: Difficulty in walking, not elsewhere classified  Muscle weakness (generalized)  Chronic pain of left knee     Problem List Patient Active Problem List   Diagnosis Date Noted  . S/P left unicompartmental knee replacement 01/26/2019  . Hospital discharge follow-up 12/26/2018  . Incidental pulmonary nodule, > 24mm and < 36mm 12/26/2018  . Decreased GFR 12/26/2018  . SOB (shortness of breath) 12/08/2018  . Left bundle branch block (LBBB) determined by electrocardiography 12/02/2018  . GERD  (gastroesophageal reflux disease) 09/29/2018  . Anxiety disorder 03/25/2018  . Postprandial diarrhea 04/18/2017  . Chronic pain of left knee 05/27/2016  . Exertional dyspnea 04/15/2016  . Left carotid bruit 04/14/2016  . Menopausal hot flushes 08/07/2015  . H/O malignant neoplasm of skin 06/25/2015  . Hypothyroidism 01/31/2015  . History of cervical cancer 01/10/2015  . Preoperative evaluation of a medical condition to rule out surgical contraindications (TAR required) 01/10/2015  . Hyperlipidemia 02/03/2014  . Medicare annual wellness visit, subsequent 01/19/2014  . History of IBS 01/19/2014  . S/P breast augmentation 01/19/2014  . Primary localized osteoarthritis of left knee 07/06/2012  . Colon polyps 01/30/2012  . Low back pain potentially associated with radiculopathy 01/30/2012    Joneen Boers PT, DPT   02/21/2019, 2:59 PM  [facility] [address] [place], [address] Phone: [phone]   Fax:  [phone]  Name: MERI PELOT MRN: [id number] Date of Birth: 03-Jul-1931

## 2019-02-22 ENCOUNTER — Other Ambulatory Visit: Payer: Self-pay | Admitting: Internal Medicine

## 2019-02-23 ENCOUNTER — Other Ambulatory Visit: Payer: Self-pay | Admitting: Internal Medicine

## 2019-02-24 ENCOUNTER — Ambulatory Visit: Payer: Medicare Other | Attending: Orthopedic Surgery

## 2019-02-24 ENCOUNTER — Other Ambulatory Visit: Payer: Self-pay

## 2019-02-24 DIAGNOSIS — G8929 Other chronic pain: Secondary | ICD-10-CM | POA: Insufficient documentation

## 2019-02-24 DIAGNOSIS — M25562 Pain in left knee: Secondary | ICD-10-CM | POA: Diagnosis not present

## 2019-02-24 DIAGNOSIS — R262 Difficulty in walking, not elsewhere classified: Secondary | ICD-10-CM | POA: Insufficient documentation

## 2019-02-24 DIAGNOSIS — M6281 Muscle weakness (generalized): Secondary | ICD-10-CM | POA: Diagnosis not present

## 2019-02-24 NOTE — Therapy (Signed)
[facility] [address] [place], [address] Phone: [phone]   Fax:  [phone]  Physical Therapy Treatment  Patient Details  Name: Emily Wu MRN: [id number] Date of Birth: Apr 08, 1931 Referring Provider (PT): Mardelle Matte MD   Encounter Date: 02/24/2019  PT End of Session - 02/24/19 1101    Visit Number  5    Number of Visits  13    Date for PT Re-Evaluation  03/23/19    Authorization Type  5/ 10 Medicare    PT Start Time  1101    PT Stop Time  1143    PT Time Calculation (min)  42 min    Activity Tolerance  Patient tolerated treatment well    Behavior During Therapy  [facility] for tasks assessed/performed       Past Medical History:  Diagnosis Date  . Arthritis    "knees, shoulders, chest" (02/03/2014)  . Bruises easily   . Cervical cancer ([place]) 1965  . High cholesterol   . History of kidney stones   . Hypertension    DENIES   . Tendency toward bleeding easily [facility])     Past Surgical History:  Procedure Laterality Date  . ABDOMINAL HYSTERECTOMY  1965  . APPENDECTOMY    . CHOLECYSTECTOMY    . KNEE ARTHROSCOPY Left    [place]  . PARTIAL KNEE ARTHROPLASTY Left 01/26/2019   Procedure: UNICOMPARTMENTAL KNEE;  Surgeon: Marchia Bond, MD;  Location: [place];  Service: Orthopedics;  Laterality: Left;  . SHOULDER OPEN ROTATOR CUFF REPAIR Bilateral 2007,  2010   [place]  . THROAT SURGERY  2014   "arthritis hugh knot" (02/03/2014)    There were no vitals filed for this visit.  Subjective Assessment - 02/24/19 1103    Subjective  L knee is good. No pain or discomfort.     Pertinent History  S/p L partial knee replacement 01/26/2019    Limitations  Walking;Standing    Diagnostic tests  none currently    Patient Stated Goals  Return to household chores    Currently in Pain?  No/denies    Pain Score  0-No pain    Pain Onset  More than a month ago                               PT  Education - 02/24/19 1103    Education Details  ther-ex, HEP    Person(s) Educated  Patient    Methods  Explanation;Tactile cues;Verbal cues;Demonstration;Handout    Comprehension  Verbalized understanding;Returned demonstration         Objective    MedbridgeAccess Code: [email or web address]  Denies blood pressure problems.  4 weeks post op  Therapeutic Exercise  Seated L knee AROM at start of session:  Extension -10degrees Flexion: 105degrees  Seated L knee flexion AAROM 10x4 with PT 110 degrees seated L knee flexion AROM afterwards  Supine quad set 10x5 seconds3sets with overpressure   Seated L knee extension              -5 degrees after aforementioned exercises.   standing B gastroc stretch at stair step with B UE assist 30 seconds x 3 to promote knee extension ROM  Forward step up onto 4 inch step with R UE assist, 10x2 with emphasis on femoral control. L LE   Lateral step up onto 4 inch step with R UE assist using L LE  10x2 with emphasis on femoral control    Standing hip abduction with one to B UE assist  R 20x L 20x  Improved exercise technique, movement at target joints, use of target muscles aftermin tomod verbal, visual, tactile cues.   Performed to address LE weakness and knee AROM.Patient demonstrates no increase in pain at the end of the session   Manual therapy  Supine STM L hamstrings with leg propped on longitudinal pillow to promote knee extension ROM       Response to treatment Improved seated L knee flexionand extensionAROM after exercises. Pt tolerated session well without aggravation of symptoms.   Clinical impression Continued working on improving L knee flexion and extension AROM as well as improving L LE strength to promote ability to perform standing tasks such as chores with less difficulty. Added forward and lateral step ups using 4 inch  step with UE assist to continue to promote strength and femoral control for proper knee mechanics. Pt tolerated session well without aggravation of symptoms. Pt will benefit from continued skilled physical therapy services to improve AROM, strength, and function.      PT Long Term Goals - 02/09/19 1306      PT LONG TERM GOAL #1   Title  Patient will demonstrate independence with home exercises for strengthening and balance to prevent future falls and improve confidence with walking on level, uneven surfaces.    Baseline  Dependent form/technique    Time  6    Period  Weeks    Status  New    Target Date  03/23/19      PT LONG TERM GOAL #2   Title  Patient will improve 5xSTS to under 12 sec to improvement functional strength and allow for improvement to start walking from sitting    Baseline  20sec    Time  6    Period  Weeks    Status  New    Target Date  03/23/19      PT LONG TERM GOAL #3   Title  Patient will improvement ability to perform household chores without difficulty or increase in pain to return to prior level of function.    Baseline  Unable to perform    Time  6    Period  Weeks    Status  New    Target Date  03/23/19      PT LONG TERM GOAL #4   Title  Patient will be able to ambulate over 1 m/s without an AD to improve ability to ambulate in community settings     Baseline  .27m/s    Time  6    Period  Weeks    Status  New    Target Date  03/23/19            Plan - 02/24/19 1056    Clinical Impression Statement  Continued working on improving L knee flexion and extension AROM as well as improving L LE strength to promote ability to perform standing tasks such as chores with less difficulty. Added forward and lateral step ups using 4 inch step with UE assist to continue to promote strength and femoral control for proper knee mechanics. Pt tolerated session well without aggravation of symptoms. Pt will benefit from continued skilled physical therapy services  to improve AROM, strength, and function.     Personal Factors and Comorbidities  Age;Sex;Comorbidity 3+    Comorbidities  knee OA, S/P knee partial TKA, decreased GFR  Examination-Activity Limitations  Squat;Stairs;Stand;Locomotion Level;Carry    Examination-Participation Restrictions  Laundry;Volunteer;Community Activity    Stability/Clinical Decision Making  Evolving/Moderate complexity    Rehab Potential  Good    PT Frequency  2x / week    PT Duration  6 weeks    PT Treatment/Interventions  Patient/family education;Therapeutic activities;Therapeutic exercise;ADLs/Self Care Home Management;Cryotherapy;Ultrasound;Moist Heat;Iontophoresis 4mg /ml Dexamethasone;Electrical Stimulation;Gait training;Neuromuscular re-education;Manual techniques;Dry needling;Passive range of motion;Joint Manipulations    PT Next Visit Plan  continue improvement in knee flexion, strength    PT Home Exercise Plan  See education section    Consulted and Agree with Plan of Care  Patient       Patient will benefit from skilled therapeutic intervention in order to improve the following deficits and impairments:  Abnormal gait, Pain, Impaired sensation, Decreased coordination, Decreased mobility, Increased muscle spasms, Decreased endurance, Decreased activity tolerance, Decreased range of motion, Decreased strength, Decreased balance  Visit Diagnosis: Difficulty in walking, not elsewhere classified  Muscle weakness (generalized)  Chronic pain of left knee     Problem List Patient Active Problem List   Diagnosis Date Noted  . S/P left unicompartmental knee replacement 01/26/2019  . Hospital discharge follow-up 12/26/2018  . Incidental pulmonary nodule, > 29mm and < 27mm 12/26/2018  . Decreased GFR 12/26/2018  . SOB (shortness of breath) 12/08/2018  . Left bundle branch block (LBBB) determined by electrocardiography 12/02/2018  . GERD (gastroesophageal reflux disease) 09/29/2018  . Anxiety disorder 03/25/2018   . Postprandial diarrhea 04/18/2017  . Chronic pain of left knee 05/27/2016  . Exertional dyspnea 04/15/2016  . Left carotid bruit 04/14/2016  . Menopausal hot flushes 08/07/2015  . H/O malignant neoplasm of skin 06/25/2015  . Hypothyroidism 01/31/2015  . History of cervical cancer 01/10/2015  . Preoperative evaluation of a medical condition to rule out surgical contraindications (TAR required) 01/10/2015  . Hyperlipidemia 02/03/2014  . Medicare annual wellness visit, subsequent 01/19/2014  . History of IBS 01/19/2014  . S/P breast augmentation 01/19/2014  . Primary localized osteoarthritis of left knee 07/06/2012  . Colon polyps 01/30/2012  . Low back pain potentially associated with radiculopathy 01/30/2012     Joneen Boers PT, DPT  02/24/2019, 1:19 PM  [facility] [address] [place], [address] Phone: [phone]   Fax:  [phone]  Name: Emily Wu MRN: [id number] Date of Birth: 1931/10/27

## 2019-02-24 NOTE — Patient Instructions (Signed)
  MedbridgeAccess Code: [email or web address]  Standing hip abduction with one UE assist  R 10x2 L 10x2

## 2019-02-24 NOTE — Telephone Encounter (Signed)
Last OV 01/04/2019  Last refilled 02/23/2019

## 2019-02-28 ENCOUNTER — Ambulatory Visit: Payer: Medicare Other

## 2019-03-03 ENCOUNTER — Ambulatory Visit: Payer: Medicare Other

## 2019-03-03 ENCOUNTER — Other Ambulatory Visit: Payer: Self-pay

## 2019-03-03 DIAGNOSIS — M25562 Pain in left knee: Secondary | ICD-10-CM | POA: Diagnosis not present

## 2019-03-03 DIAGNOSIS — G8929 Other chronic pain: Secondary | ICD-10-CM | POA: Diagnosis not present

## 2019-03-03 DIAGNOSIS — R262 Difficulty in walking, not elsewhere classified: Secondary | ICD-10-CM | POA: Diagnosis not present

## 2019-03-03 DIAGNOSIS — M6281 Muscle weakness (generalized): Secondary | ICD-10-CM | POA: Diagnosis not present

## 2019-03-03 NOTE — Therapy (Signed)
[facility] [address] [place], [address] Phone: [phone]   Fax:  [phone]  Physical Therapy Treatment  Patient Details  Name: Emily Wu MRN: [id number] Date of Birth: June 02, 1931 Referring Provider (PT): Mardelle Matte MD   Encounter Date: 03/03/2019  PT End of Session - 03/03/19 1401    Visit Number  6    Number of Visits  13    Date for PT Re-Evaluation  03/23/19    Authorization Type  6/ 10 Medicare    PT Start Time  1401    PT Stop Time  1449    PT Time Calculation (min)  48 min    Activity Tolerance  Patient tolerated treatment well    Behavior During Therapy  [facility] for tasks assessed/performed       Past Medical History:  Diagnosis Date  . Arthritis    "knees, shoulders, chest" (02/03/2014)  . Bruises easily   . Cervical cancer ([place]) 1965  . High cholesterol   . History of kidney stones   . Hypertension    DENIES   . Tendency toward bleeding easily [facility])     Past Surgical History:  Procedure Laterality Date  . ABDOMINAL HYSTERECTOMY  1965  . APPENDECTOMY    . CHOLECYSTECTOMY    . KNEE ARTHROSCOPY Left    [place]  . PARTIAL KNEE ARTHROPLASTY Left 01/26/2019   Procedure: UNICOMPARTMENTAL KNEE;  Surgeon: Marchia Bond, MD;  Location: [place];  Service: Orthopedics;  Laterality: Left;  . SHOULDER OPEN ROTATOR CUFF REPAIR Bilateral 2007,  2010   [place]  . THROAT SURGERY  2014   "arthritis hugh knot" (02/03/2014)    There were no vitals filed for this visit.  Subjective Assessment - 03/03/19 1403    Subjective  Pt states that she was not able to come to PT on Monday. Might have pinched a nerve on her back when standing to do the standing hip abduction exercises.  Gradually eased off. Back did not bother her until she did the exercise at home.  2-3/10 L lumbothoracic pain currently.  No L knee pain.   L knee is good.     Pertinent History  S/p L partial knee replacement 01/26/2019     Limitations  Walking;Standing    Diagnostic tests  none currently    Patient Stated Goals  Return to household chores    Currently in Pain?  Yes    Pain Score  3    2-3/10 L back pain.    Pain Onset  More than a month ago                               PT Education - 03/03/19 1423    Education Details  ther-ex, HEP    Person(s) Educated  Patient    Methods  Explanation;Demonstration;Tactile cues;Verbal cues;Handout    Comprehension  Returned demonstration;Verbalized understanding         Objective    MedbridgeAccess Code: [email or web address]  Denies blood pressure problems.  5weeks post op   Manual therapy  Supine STM to L lateral hamstrings with leg straight  Therapeutic Exercise  Check 10 MWT and 5 times sit to stand next session if appropriate   Seated trunk flexion   No back pain afterwards  Seated B shoulder extension isometrics, hands on thighs 10x5 seconds to promote trunk muscle strengthening and help decrease back pain  Standing hip abduction with B UE assist 2x each LE  Pt able to perform properly. Pt was recommended to hold off on the HEP to see if it keeps her from having a recurrence of her back symptoms. Pt demonstrated and verbalized understanding.   Seated L knee AROM Extension -8degrees Flexion: 110degrees  Seated L knee flexion AAROM 10x4 with PT 112degrees seated L knee flexion AROM afterwards  Supine quad set 10x5 seconds3sets with overpressure   Seated L knee extension   -4 degrees seated L knee extension AROM after aforementioned treatment  Forward step up onto 4 inch step with R UE assist, 10x with emphasis on femoral control. L LE   Then 10x2 without UE assist. SBA   Lateral step up onto 4 inch step with R UE assist using L LE 10x2 with emphasis on femoral control    Improved exercise technique, movement at target joints, use of target muscles aftermin tomod  verbal, visual, tactile cues.   Performed to address LE weakness and knee AROM.Patient demonstrates no increase in pain at the end of the session    Response to treatment Improved seated L knee flexionand extensionAROM after exercises. Good muscle use felt with step up exercises.  Pt tolerated session well without aggravation of symptoms.   Clinical impression Pt was recommended to hold off on doing the standing hip abduction exercises at home to help prevent back pain. Pt was also recommended to perform seated B shoulder extension isometrics with hands on thighs to promote trunk muscle use to help with back pain. Improving L knee AROM and strength observed overall. Continued working on knee flexion and extension AROM as well as L LE strengthening to promote progress. Pt will benefit from continued skilled physical therapy services to improve strength, ROM, and function.    PT Long Term Goals - 02/09/19 1306      PT LONG TERM GOAL #1   Title  Patient will demonstrate independence with home exercises for strengthening and balance to prevent future falls and improve confidence with walking on level, uneven surfaces.    Baseline  Dependent form/technique    Time  6    Period  Weeks    Status  New    Target Date  03/23/19      PT LONG TERM GOAL #2   Title  Patient will improve 5xSTS to under 12 sec to improvement functional strength and allow for improvement to start walking from sitting    Baseline  20sec    Time  6    Period  Weeks    Status  New    Target Date  03/23/19      PT LONG TERM GOAL #3   Title  Patient will improvement ability to perform household chores without difficulty or increase in pain to return to prior level of function.    Baseline  Unable to perform    Time  6    Period  Weeks    Status  New    Target Date  03/23/19      PT LONG TERM GOAL #4   Title  Patient will be able to ambulate over 1 m/s without an AD to improve ability to ambulate  in community settings     Baseline  .76m/s    Time  6    Period  Weeks    Status  New    Target Date  03/23/19  Plan - 03/03/19 1425    Clinical Impression Statement  Pt was recommended to hold off on doing the standing hip abduction exercises at home to help prevent back pain. Pt was also recommended to perform seated B shoulder extension isometrics with hands on thighs to promote trunk muscle use to help with back pain. Improving L knee AROM and strength observed overall. Continued working on knee flexion and extension AROM as well as L LE strengthening to promote progress. Pt will benefit from continued skilled physical therapy services to improve strength, ROM, and function.     Personal Factors and Comorbidities  Age;Sex;Comorbidity 3+    Comorbidities  knee OA, S/P knee partial TKA, decreased GFR    Examination-Activity Limitations  Squat;Stairs;Stand;Locomotion Level;Carry    Examination-Participation Restrictions  Laundry;Volunteer;Community Activity    Stability/Clinical Decision Making  Evolving/Moderate complexity    Rehab Potential  Good    PT Frequency  2x / week    PT Duration  6 weeks    PT Treatment/Interventions  Patient/family education;Therapeutic activities;Therapeutic exercise;ADLs/Self Care Home Management;Cryotherapy;Ultrasound;Moist Heat;Iontophoresis 4mg /ml Dexamethasone;Electrical Stimulation;Gait training;Neuromuscular re-education;Manual techniques;Dry needling;Passive range of motion;Joint Manipulations    PT Next Visit Plan  continue improvement in knee flexion, strength    PT Home Exercise Plan  See education section    Consulted and Agree with Plan of Care  Patient       Patient will benefit from skilled therapeutic intervention in order to improve the following deficits and impairments:  Abnormal gait, Pain, Impaired sensation, Decreased coordination, Decreased mobility, Increased muscle spasms, Decreased endurance, Decreased activity  tolerance, Decreased range of motion, Decreased strength, Decreased balance  Visit Diagnosis: Difficulty in walking, not elsewhere classified  Muscle weakness (generalized)  Chronic pain of left knee     Problem List Patient Active Problem List   Diagnosis Date Noted  . S/P left unicompartmental knee replacement 01/26/2019  . Hospital discharge follow-up 12/26/2018  . Incidental pulmonary nodule, > 21mm and < 12mm 12/26/2018  . Decreased GFR 12/26/2018  . SOB (shortness of breath) 12/08/2018  . Left bundle branch block (LBBB) determined by electrocardiography 12/02/2018  . GERD (gastroesophageal reflux disease) 09/29/2018  . Anxiety disorder 03/25/2018  . Postprandial diarrhea 04/18/2017  . Chronic pain of left knee 05/27/2016  . Exertional dyspnea 04/15/2016  . Left carotid bruit 04/14/2016  . Menopausal hot flushes 08/07/2015  . H/O malignant neoplasm of skin 06/25/2015  . Hypothyroidism 01/31/2015  . History of cervical cancer 01/10/2015  . Preoperative evaluation of a medical condition to rule out surgical contraindications (TAR required) 01/10/2015  . Hyperlipidemia 02/03/2014  . Medicare annual wellness visit, subsequent 01/19/2014  . History of IBS 01/19/2014  . S/P breast augmentation 01/19/2014  . Primary localized osteoarthritis of left knee 07/06/2012  . Colon polyps 01/30/2012  . Low back pain potentially associated with radiculopathy 01/30/2012   Joneen Boers PT, DPT   03/03/2019, 5:04 PM  [facility] [address], [place], [address] Phone: [phone]   Fax:  [phone]  Name: Emily Wu MRN: [id number] Date of Birth: 1931-04-11

## 2019-03-03 NOTE — Patient Instructions (Signed)
  Sitting on a chair    Press your hands on your thighs to feel your abdominal muscles contract.   Hold for 5 seconds comfortably.   Repeat 10 times.   Perform 3 sets daily.

## 2019-03-07 ENCOUNTER — Ambulatory Visit: Payer: Medicare Other

## 2019-03-07 ENCOUNTER — Other Ambulatory Visit: Payer: Self-pay

## 2019-03-07 DIAGNOSIS — M6281 Muscle weakness (generalized): Secondary | ICD-10-CM

## 2019-03-07 DIAGNOSIS — G8929 Other chronic pain: Secondary | ICD-10-CM

## 2019-03-07 DIAGNOSIS — R262 Difficulty in walking, not elsewhere classified: Secondary | ICD-10-CM

## 2019-03-07 DIAGNOSIS — M25562 Pain in left knee: Secondary | ICD-10-CM | POA: Diagnosis not present

## 2019-03-07 NOTE — Therapy (Signed)
[facility] [address] [place], [address] Phone: [phone]   Fax:  [phone]  Physical Therapy Treatment And Progress Report  Patient Details  Name: Emily Wu MRN: [id number] Date of Birth: Apr 21, 1931 Referring Provider (PT): Mardelle Matte MD   Encounter Date: 03/07/2019  PT End of Session - 03/07/19 1404    Visit Number  7    Number of Visits  13    Date for PT Re-Evaluation  03/23/19    Authorization Type  7/ 10 Medicare    PT Start Time  4235    PT Stop Time  3614    PT Time Calculation (min)  49 min    Activity Tolerance  Patient tolerated treatment well    Behavior During Therapy  [facility] for tasks assessed/performed       Past Medical History:  Diagnosis Date  . Arthritis    "knees, shoulders, chest" (02/03/2014)  . Bruises easily   . Cervical cancer ([place]) 1965  . High cholesterol   . History of kidney stones   . Hypertension    DENIES   . Tendency toward bleeding easily [facility])     Past Surgical History:  Procedure Laterality Date  . ABDOMINAL HYSTERECTOMY  1965  . APPENDECTOMY    . CHOLECYSTECTOMY    . KNEE ARTHROSCOPY Left    [place]  . PARTIAL KNEE ARTHROPLASTY Left 01/26/2019   Procedure: UNICOMPARTMENTAL KNEE;  Surgeon: Marchia Bond, MD;  Location: [place];  Service: Orthopedics;  Laterality: Left;  . SHOULDER OPEN ROTATOR CUFF REPAIR Bilateral 2007,  2010   [place]  . THROAT SURGERY  2014   "arthritis hugh knot" (02/03/2014)    There were no vitals filed for this visit.  Subjective Assessment - 03/07/19 1405    Subjective  L  knee feels great, no pain. Did some house cleaning this morning. Able to step up with her L LE onto stairs wiht R UE assist fine.  Back has not bothered her like it did since last session.     Pertinent History  S/p L partial knee replacement 01/26/2019    Limitations  Walking;Standing    Diagnostic tests  none currently    Patient Stated Goals  Return to  household chores    Currently in Pain?  No/denies    Pain Score  0-No pain    Pain Onset  More than a month ago         [facility] PT Assessment - 03/07/19 1505      Observation/Other Assessments   Observations  10 MWT without AD 10 seconds (79ms); 5 times sit <> stand, no UE assist 12 seconds                           PT Education - 03/07/19 1406    Education Details  ther-ex    Person(s) Educated  Patient    Methods  Explanation;Demonstration;Tactile cues;Verbal cues    Comprehension  Returned demonstration;Verbalized understanding         Objective    MedbridgeAccess Code: [email or web address]  Denies blood pressure problems.  5-6weeks post op  Pt states she has a f/u appt with Dr. LMardelle Mattethis Wednesday 03/09/2019   Therapeutic Exercise  Seated L knee AROM Extension -7degrees Flexion: 110degrees  5 times sit <> stand with chair with arms  No UE assist 12 seconds   10 MWT  10 seconds (1 m/s) without  AD.    Ascending and descending 4 regular steps with B UE assist 2x. Cues to decrease circumduction and to alternate LE   Standing L knee flexion AAROM on stair step to promote knee flexion ROM 10x 1-5 seconds    Forward step up onto 4 inch step with R UE assist, 10x with emphasis on femoral control. L LE              Then 10x2 without UE assist. SBA  Lateral step up onto 4 inch step with R UE assist using L LE 10x2 with emphasis on femoral control   Leg press seat 2, plate 20 (easy) for 93J, then plate 35 (medium) for 10x2  Backward walk resisting double black theraband 10x  Lateral step resisting double black band    L 2x. Sitting rest break secondary to fatigue   Seated L knee flexion AAROM 10x3 with PT 115 degrees seated L knee flexion AROM afterwards   Improved exercise technique, movement at target joints, use of target muscles after mod verbal, visual, tactile cues.     Performed to  address LE weakness and knee AROM.Patient demonstrates no increase in pain at the end of the session    Response to treatment Good muscle use felt with step up exercises.  Pt tolerated session well without aggravation of symptoms.   Clinical impression Pt demonstrates improved L knee flexion AROM, increased gait speed as well as improved ability to perform sit <> stand transfers since initial evaluation. No complain of L knee pain during sessions. Pt making good progress with PT towards goals. Pt will benefit from continued skilled physical therapy services to improve L knee ROM, strength, ability to ambulate and function.      PT Long Term Goals - 03/07/19 1507      PT LONG TERM GOAL #1   Title  Patient will demonstrate independence with home exercises for strengthening and balance to prevent future falls and improve confidence with walking on level, uneven surfaces.    Baseline  Dependent form/technique    Time  6    Period  Weeks    Status  On-going    Target Date  03/23/19      PT LONG TERM GOAL #2   Title  Patient will improve 5xSTS to under 12 sec to improvement functional strength and allow for improvement to start walking from sitting    Baseline  20sec; 12 seconds (03/07/2019)    Time  6    Period  Weeks    Status  Partially Met    Target Date  03/23/19      PT LONG TERM GOAL #3   Title  Patient will improvement ability to perform household chores without difficulty or increase in pain to return to prior level of function.    Baseline  Unable to perform; Pt states being able to perform chores this morning and her L knee does not bother her currently (03/07/2019)    Time  6    Period  Weeks    Status  Partially Met    Target Date  03/23/19      PT LONG TERM GOAL #4   Title  Patient will be able to ambulate over 1 m/s without an AD to improve ability to ambulate in community settings     Baseline  .44ms; 1 m/s (03/07/2019)    Time  6    Period  Weeks     Status  Partially Met  Target Date  03/23/19            Plan - 03/07/19 1403    Clinical Impression Statement  Pt demonstrates improved L knee flexion AROM, increased gait speed as well as improved ability to perform sit <> stand transfers since initial evaluation. No complain of L knee pain during sessions. Pt making good progress with PT towards goals. Pt will benefit from continued skilled physical therapy services to improve L knee ROM, strength, ability to ambulate and function.     Personal Factors and Comorbidities  Age;Sex;Comorbidity 3+    Comorbidities  knee OA, S/P knee partial TKA, decreased GFR    Examination-Activity Limitations  Squat;Stairs;Stand;Locomotion Level;Carry    Examination-Participation Restrictions  Laundry;Volunteer;Community Activity    Stability/Clinical Decision Making  Evolving/Moderate complexity    Rehab Potential  Good    PT Frequency  2x / week    PT Duration  6 weeks    PT Treatment/Interventions  Patient/family education;Therapeutic activities;Therapeutic exercise;ADLs/Self Care Home Management;Cryotherapy;Ultrasound;Moist Heat;Iontophoresis 51m/ml Dexamethasone;Electrical Stimulation;Gait training;Neuromuscular re-education;Manual techniques;Dry needling;Passive range of motion;Joint Manipulations    PT Next Visit Plan  continue improvement in knee flexion, strength    PT Home Exercise Plan  See education section    Consulted and Agree with Plan of Care  Patient       Patient will benefit from skilled therapeutic intervention in order to improve the following deficits and impairments:  Abnormal gait, Pain, Impaired sensation, Decreased coordination, Decreased mobility, Increased muscle spasms, Decreased endurance, Decreased activity tolerance, Decreased range of motion, Decreased strength, Decreased balance  Visit Diagnosis: Difficulty in walking, not elsewhere classified  Muscle weakness (generalized)  Chronic pain of left  knee     Problem List Patient Active Problem List   Diagnosis Date Noted  . S/P left unicompartmental knee replacement 01/26/2019  . Hospital discharge follow-up 12/26/2018  . Incidental pulmonary nodule, > 380mand < 41m7m2/12/2018  . Decreased GFR 12/26/2018  . SOB (shortness of breath) 12/08/2018  . Left bundle branch block (LBBB) determined by electrocardiography 12/02/2018  . GERD (gastroesophageal reflux disease) 09/29/2018  . Anxiety disorder 03/25/2018  . Postprandial diarrhea 04/18/2017  . Chronic pain of left knee 05/27/2016  . Exertional dyspnea 04/15/2016  . Left carotid bruit 04/14/2016  . Menopausal hot flushes 08/07/2015  . H/O malignant neoplasm of skin 06/25/2015  . Hypothyroidism 01/31/2015  . History of cervical cancer 01/10/2015  . Preoperative evaluation of a medical condition to rule out surgical contraindications (TAR required) 01/10/2015  . Hyperlipidemia 02/03/2014  . Medicare annual wellness visit, subsequent 01/19/2014  . History of IBS 01/19/2014  . S/P breast augmentation 01/19/2014  . Primary localized osteoarthritis of left knee 07/06/2012  . Colon polyps 01/30/2012  . Low back pain potentially associated with radiculopathy 01/30/2012   Thank you for your referral.  MigJoneen Boers, DPT   03/07/2019, 3:20 PM  [facility] [address],[place]one: [phone]:  [phone]: ShiBENITA BOONSTRAN: [phone] of Birth: 6/2Nov 09, 1932

## 2019-03-09 ENCOUNTER — Ambulatory Visit: Payer: Medicare Other

## 2019-03-09 DIAGNOSIS — M1712 Unilateral primary osteoarthritis, left knee: Secondary | ICD-10-CM | POA: Diagnosis not present

## 2019-03-10 ENCOUNTER — Ambulatory Visit: Payer: Medicare Other

## 2019-03-10 ENCOUNTER — Other Ambulatory Visit: Payer: Self-pay

## 2019-03-10 DIAGNOSIS — M25562 Pain in left knee: Secondary | ICD-10-CM

## 2019-03-10 DIAGNOSIS — M6281 Muscle weakness (generalized): Secondary | ICD-10-CM

## 2019-03-10 DIAGNOSIS — G8929 Other chronic pain: Secondary | ICD-10-CM | POA: Diagnosis not present

## 2019-03-10 DIAGNOSIS — R262 Difficulty in walking, not elsewhere classified: Secondary | ICD-10-CM | POA: Diagnosis not present

## 2019-03-10 NOTE — Therapy (Signed)
[place] [address] [place], [address] Phone: [phone]   Fax:  [phone]  Physical Therapy Treatment  Patient Details  Name: Emily Wu MRN: [id number] Date of Birth: [birth year] Referring Provider (PT): Mardelle Matte MD   Encounter Date: 03/10/2019  PT End of Session - 03/10/19 1403    Visit Number  8    Number of Visits  13    Date for PT Re-Evaluation  03/23/19    Authorization Type  8/ 10 Medicare    PT Start Time  1404    PT Stop Time  1447    PT Time Calculation (min)  43 min    Activity Tolerance  Patient tolerated treatment well    Behavior During Therapy  [facility] for tasks assessed/performed       Past Medical History:  Diagnosis Date  . Arthritis    "knees, shoulders, chest" (02/03/2014)  . Bruises easily   . Cervical cancer ([place]) 1965  . High cholesterol   . History of kidney stones   . Hypertension    DENIES   . Tendency toward bleeding easily [facility])     Past Surgical History:  Procedure Laterality Date  . ABDOMINAL HYSTERECTOMY  1965  . APPENDECTOMY    . CHOLECYSTECTOMY    . KNEE ARTHROSCOPY Left    [facility]  . PARTIAL KNEE ARTHROPLASTY Left 01/26/2019   Procedure: UNICOMPARTMENTAL KNEE;  Surgeon: Marchia Bond, MD;  Location: [place];  Service: Orthopedics;  Laterality: Left;  . SHOULDER OPEN ROTATOR CUFF REPAIR Bilateral 2007,  2010   [place]  . THROAT SURGERY  2014   "arthritis hugh knot" (02/03/2014)    There were no vitals filed for this visit.  Subjective Assessment - 03/10/19 1405    Subjective  Had a good follow up appointment with her surgeon. MD was surprised about how good she is doing. No difficulty performing her chores or increased in L knee pain.     Pertinent History  S/p L partial knee replacement 01/26/2019    Limitations  Walking;Standing    Diagnostic tests  none currently    Patient Stated Goals  Return to household chores    Currently in Pain?  No/denies    Pain Score  0-No pain    Pain Onset  More than a month ago                               PT Education - 03/10/19 1440    Education Details  ther-ex    [employer]) Educated  Patient    Methods  Explanation;Demonstration;Tactile cues;Verbal cues    Comprehension  Returned demonstration;Verbalized understanding         Objective    MedbridgeAccess Code: [email or web address]  Denies blood pressure problems.  6weeks post op   Therapeutic Exercise   5 times sit <> stand with chair with arms             No UE assist        10 MWT 2x  10 seconds (1 m/s) without AD both times    Seated L knee AROM Extension -6degrees Flexion: 112degrees                     Seated L knee flexion PROM 10x3   Then AAROM 10x3 with PT 118 degrees seated L knee flexion AROM afterwards   Leg press seat  2 plate 35 for 79G9 B LE. R knee crepitus.   Then L LE plate 15 for 5x, then plate 10 for 21J    Backward walk resisting double black theraband 10x    Forward step up onto 4 inch step with R UE assist, 10x with emphasis on femoral control. L LE Then 10x2 without UE assist. SBA  Lateral step up onto 4 inch step with R UE assist using L LE 10x2 with emphasis on femoral control   Improved exercise technique, movement at target joints, use of target muscles after min to mod verbal, visual, tactile cues.     Performed to address LE weakness and knee AROM.Patient demonstrates no increase in pain at the end of the session    Response to treatment Good muscle use felt with step up exercises.Pt tolerated session well without aggravation of symptoms.   Clinical impression Improved seated R knee flexion AROM after exercise. Able to consistently ambulate at 1 m/s without AD and no LOB. Continued working on L LE strengthening, and ROM to promote ability to perform functional tasks such as stair  negotiation and transfers. Pt will benefit from continued skilled physical therapy services to improve ROM, strength and function.         PT Long Term Goals - 03/07/19 1507      PT LONG TERM GOAL #1   Title  Patient will demonstrate independence with home exercises for strengthening and balance to prevent future falls and improve confidence with walking on level, uneven surfaces.    Baseline  Dependent form/technique    Time  6    Period  Weeks    Status  On-going    Target Date  03/23/19      PT LONG TERM GOAL #2   Title  Patient will improve 5xSTS to under 12 sec to improvement functional strength and allow for improvement to start walking from sitting    Baseline  20sec; 12 seconds (03/07/2019)    Time  6    Period  Weeks    Status  Partially Met    Target Date  03/23/19      PT LONG TERM GOAL #3   Title  Patient will improvement ability to perform household chores without difficulty or increase in pain to return to prior level of function.    Baseline  Unable to perform; Pt states being able to perform chores this morning and her L knee does not bother her currently (03/07/2019)    Time  6    Period  Weeks    Status  Partially Met    Target Date  03/23/19      PT LONG TERM GOAL #4   Title  Patient will be able to ambulate over 1 m/s without an AD to improve ability to ambulate in community settings     Baseline  .28ms; 1 m/s (03/07/2019)    Time  6    Period  Weeks    Status  Partially Met    Target Date  03/23/19            Plan - 03/10/19 1442    Clinical Impression Statement  Improved seated R knee flexion AROM after exercise. Able to consistently ambulate at 1 m/s without AD and no LOB. Continued working on L LE strengthening, and ROM to promote ability to perform functional tasks such as stair negotiation and transfers. Pt will benefit from continued skilled physical therapy services to improve ROM, strength and function.  Personal Factors and  Comorbidities  Age;Sex;Comorbidity 3+    Comorbidities  knee OA, S/P knee partial TKA, decreased GFR    Examination-Activity Limitations  Squat;Stairs;Stand;Locomotion Level;Carry    Examination-Participation Restrictions  Laundry;Volunteer;Community Activity    Stability/Clinical Decision Making  Evolving/Moderate complexity    Rehab Potential  Good    PT Frequency  2x / week    PT Duration  6 weeks    PT Treatment/Interventions  Patient/family education;Therapeutic activities;Therapeutic exercise;ADLs/Self Care Home Management;Cryotherapy;Ultrasound;Moist Heat;Iontophoresis 22m/ml Dexamethasone;Electrical Stimulation;Gait training;Neuromuscular re-education;Manual techniques;Dry needling;Passive range of motion;Joint Manipulations    PT Next Visit Plan  continue improvement in knee flexion, strength    PT Home Exercise Plan  See education section    Consulted and Agree with Plan of Care  Patient       Patient will benefit from skilled therapeutic intervention in order to improve the following deficits and impairments:  Abnormal gait, Pain, Impaired sensation, Decreased coordination, Decreased mobility, Increased muscle spasms, Decreased endurance, Decreased activity tolerance, Decreased range of motion, Decreased strength, Decreased balance  Visit Diagnosis: Difficulty in walking, not elsewhere classified  Muscle weakness (generalized)  Chronic pain of left knee     Problem List Patient Active Problem List   Diagnosis Date Noted  . S/P left unicompartmental knee replacement 01/26/2019  . Hospital discharge follow-up 12/26/2018  . Incidental pulmonary nodule, > 3634mand < 34m44m2/12/2018  . Decreased GFR 12/26/2018  . SOB (shortness of breath) 12/08/2018  . Left bundle branch block (LBBB) determined by electrocardiography 12/02/2018  . GERD (gastroesophageal reflux disease) 09/29/2018  . Anxiety disorder 03/25/2018  . Postprandial diarrhea 04/18/2017  . Chronic pain of left knee  05/27/2016  . Exertional dyspnea 04/15/2016  . Left carotid bruit 04/14/2016  . Menopausal hot flushes 08/07/2015  . H/O malignant neoplasm of skin 06/25/2015  . Hypothyroidism 01/31/2015  . History of cervical cancer 01/10/2015  . Preoperative evaluation of a medical condition to rule out surgical contraindications (TAR required) 01/10/2015  . Hyperlipidemia 02/03/2014  . Medicare annual wellness visit, subsequent 01/19/2014  . History of IBS 01/19/2014  . S/P breast augmentation 01/19/2014  . Primary localized osteoarthritis of left knee 07/06/2012  . Colon polyps 01/30/2012  . Low back pain potentially associated with radiculopathy 01/30/2012    MigJoneen Boers, DPT   03/10/2019, 2:59 PM  [facility] [address],[place]one: [phone]:  [phone]: ShiMARVETTE SCHAMPN: [id number] of Birth: 6/2Jan 26, 1932

## 2019-03-15 ENCOUNTER — Ambulatory Visit: Payer: Medicare Other

## 2019-03-15 ENCOUNTER — Other Ambulatory Visit: Payer: Self-pay | Admitting: Internal Medicine

## 2019-03-17 ENCOUNTER — Ambulatory Visit: Payer: Medicare Other

## 2019-03-22 ENCOUNTER — Other Ambulatory Visit: Payer: Self-pay

## 2019-03-22 ENCOUNTER — Ambulatory Visit: Payer: Medicare Other

## 2019-03-22 DIAGNOSIS — G8929 Other chronic pain: Secondary | ICD-10-CM | POA: Diagnosis not present

## 2019-03-22 DIAGNOSIS — R262 Difficulty in walking, not elsewhere classified: Secondary | ICD-10-CM | POA: Diagnosis not present

## 2019-03-22 DIAGNOSIS — M6281 Muscle weakness (generalized): Secondary | ICD-10-CM

## 2019-03-22 DIAGNOSIS — M25562 Pain in left knee: Secondary | ICD-10-CM | POA: Diagnosis not present

## 2019-03-22 NOTE — Therapy (Signed)
[place] [facility] [address], [place], [address] Phone: [phone]   Fax:  [phone]  Physical Therapy Treatment And Discharge Summary  Patient Details  Name: Emily Wu MRN: [id number] Date of Birth: 14-Jun-1931 Referring Provider (PT): Mardelle Matte MD   Encounter Date: 03/22/2019  PT End of Session - 03/22/19 1516    Visit Number  9    Number of Visits  13    Date for PT Re-Evaluation  03/23/19    Authorization Type  9/ 10 Medicare    PT Start Time  1518    PT Stop Time  1537    PT Time Calculation (min)  19 min    Activity Tolerance  Patient tolerated treatment well    Behavior During Therapy  [facility] for tasks assessed/performed       Past Medical History:  Diagnosis Date  . Arthritis    "knees, shoulders, chest" (02/03/2014)  . Bruises easily   . Cervical cancer ([place]) 1965  . High cholesterol   . History of kidney stones   . Hypertension    DENIES   . Tendency toward bleeding easily [facility])     Past Surgical History:  Procedure Laterality Date  . ABDOMINAL HYSTERECTOMY  1965  . APPENDECTOMY    . CHOLECYSTECTOMY    . KNEE ARTHROSCOPY Left    [facility]  . PARTIAL KNEE ARTHROPLASTY Left 01/26/2019   Procedure: UNICOMPARTMENTAL KNEE;  Surgeon: Marchia Bond, MD;  Location: [place];  Service: Orthopedics;  Laterality: Left;  . SHOULDER OPEN ROTATOR CUFF REPAIR Bilateral 2007,  2010   [place]  . THROAT SURGERY  2014   "arthritis hugh knot" (02/03/2014)    There were no vitals filed for this visit.  Subjective Assessment - 03/22/19 1519    Subjective  L knee is good. Does not feel like she needs to be in PT but she is. No knee pain when performing household chores.  Overall easier to perform her chores.  No questions with her HEP.     Pertinent History  S/p L partial knee replacement 01/26/2019    Limitations  Walking;Standing    Diagnostic tests  none currently    Patient Stated Goals  Return to household  chores    Currently in Pain?  No/denies    Pain Score  0-No pain    Pain Onset  More than a month ago                               PT Education - 03/22/19 1521    Education Details  ther-ex    Person(s) Educated  Patient    Methods  Explanation;Verbal cues    Comprehension  Returned demonstration;Verbalized understanding         Objective    MedbridgeAccess Code: [email or web address]  Denies blood pressure problems.  8weeks post op   Therapeutic Exercise   5 times sit <> stand with chair with arms B UE assist 10 seconds   No UE assist: 11 seconds  10 MWT 2x 10 seconds (1 m/s) without AD first attempt  9 seconds (1.11 m/s) without AD second attempt  Reviewed progress with PT towards goals.   Reviewed plan of care: graduate today and continue with HEP.   Ascending and descending 4 regular steps with R rail assist 3x reciprocal pattern. Able to perform.    Session ended early secondary to pt request.  Improved exercise technique, movement at target joints, use of target muscles after mod verbal, visual, tactile cues.      Response to treatment Improved sit <> stand and 10 meter walk times. Pt tolerated session well without aggravation of symptoms.   Clinical impression Pt has progressed very well with physical therapy towards goals with improved functional strength, gait speed, balance, ability to ambulate, ability to perform chores at home and currently able to negotiate stairs with one UE assist in a reciprocal pattern. Skilled physical therapy services discharged with patient continuing progress with her exercises at home.     PT Long Term Goals - 03/22/19 1522      PT LONG TERM GOAL #1   Title  Patient will demonstrate independence with home exercises for strengthening and balance to prevent future falls and improve confidence with walking on level, uneven surfaces.    Baseline  Dependent  form/technique; Pt performs some of her HEP but no questions (03/22/2019)    Time  6    Period  Weeks    Status  Partially Met    Target Date  03/23/19      PT LONG TERM GOAL #2   Title  Patient will improve 5xSTS to under 12 sec to improvement functional strength and allow for improvement to start walking from sitting    Baseline  20sec; 12 seconds (03/07/2019); 10 seconds with UE assist, no UE assist (03/22/2019)    Time  6    Period  Weeks    Status  Achieved    Target Date  03/23/19      PT LONG TERM GOAL #3   Title  Patient will improvement ability to perform household chores without difficulty or increase in pain to return to prior level of function.    Baseline  Unable to perform; Pt states being able to perform chores this morning and her L knee does not bother her currently (03/07/2019); Better able to perform household chores overall and no pain in her knee, no difficulty based on her knee (03/22/2019)    Time  6    Period  Weeks    Status  Achieved    Target Date  03/23/19      PT LONG TERM GOAL #4   Title  Patient will be able to ambulate over 1 m/s without an AD to improve ability to ambulate in community settings     Baseline  .101ms; 1 m/s (03/07/2019); 1.11 m/s on 2nd attempt today (03/22/2019)    Time  6    Period  Weeks    Status  Achieved    Target Date  03/23/19            Plan - 03/22/19 1514    Clinical Impression Statement  Pt has progressed very well with physical therapy towards goals with improved functional strength, gait speed, balance, ability to ambulate, ability to perform chores at home and currently able to negotiate stairs with one UE assist in a reciprocal pattern. Skilled physical therapy services discharged with patient continuing progress with her exercises at home.     Personal Factors and Comorbidities  Age;Sex;Comorbidity 3+    Comorbidities  knee OA, S/P knee partial TKA, decreased GFR    Examination-Activity Limitations  Carry     Examination-Participation Restrictions  --    Stability/Clinical Decision Making  Stable/Uncomplicated    Clinical Decision Making  Low    Rehab Potential  Good    PT Frequency  --  PT Duration  --    PT Treatment/Interventions  Patient/family education;Therapeutic activities;Therapeutic exercise;Gait training;Neuromuscular re-education;Manual techniques;Passive range of motion    PT Next Visit Plan  Continue progress with her HEP    PT Home Exercise Plan  --    Consulted and Agree with Plan of Care  Patient       Patient will benefit from skilled therapeutic intervention in order to improve the following deficits and impairments:  Decreased endurance, Decreased activity tolerance, Decreased strength  Visit Diagnosis: Difficulty in walking, not elsewhere classified  Muscle weakness (generalized)  Chronic pain of left knee     Problem List Patient Active Problem List   Diagnosis Date Noted  . S/P left unicompartmental knee replacement 01/26/2019  . Hospital discharge follow-up 12/26/2018  . Incidental pulmonary nodule, > 22m and < 854m02/12/2018  . Decreased GFR 12/26/2018  . SOB (shortness of breath) 12/08/2018  . Left bundle branch block (LBBB) determined by electrocardiography 12/02/2018  . GERD (gastroesophageal reflux disease) 09/29/2018  . Anxiety disorder 03/25/2018  . Postprandial diarrhea 04/18/2017  . Chronic pain of left knee 05/27/2016  . Exertional dyspnea 04/15/2016  . Left carotid bruit 04/14/2016  . Menopausal hot flushes 08/07/2015  . H/O malignant neoplasm of skin 06/25/2015  . Hypothyroidism 01/31/2015  . History of cervical cancer 01/10/2015  . Preoperative evaluation of a medical condition to rule out surgical contraindications (TAR required) 01/10/2015  . Hyperlipidemia 02/03/2014  . Medicare annual wellness visit, subsequent 01/19/2014  . History of IBS 01/19/2014  . S/P breast augmentation 01/19/2014  . Primary localized osteoarthritis of left  knee 07/06/2012  . Colon polyps 01/30/2012  . Low back pain potentially associated with radiculopathy 01/30/2012   Thank you for your referral.  MiJoneen BoersT, DPT   03/22/2019, 3:51 PM  [facility] [address]one: [phone] Fax:  [phone]: Emily Wu: [id number] of Birth: 04/1931/07/15

## 2019-04-08 ENCOUNTER — Other Ambulatory Visit: Payer: Self-pay | Admitting: Internal Medicine

## 2019-04-13 DIAGNOSIS — L821 Other seborrheic keratosis: Secondary | ICD-10-CM | POA: Diagnosis not present

## 2019-04-21 ENCOUNTER — Other Ambulatory Visit: Payer: Self-pay | Admitting: Internal Medicine

## 2019-05-27 ENCOUNTER — Other Ambulatory Visit: Payer: Self-pay | Admitting: Internal Medicine

## 2019-05-30 ENCOUNTER — Telehealth: Payer: Self-pay | Admitting: Internal Medicine

## 2019-05-30 NOTE — Telephone Encounter (Signed)
Pt called [facility] stating she needs a cortizone injection. Please advise? Thank you!

## 2019-05-30 NOTE — Telephone Encounter (Signed)
Spoke with pt and she stated that she spoke with her surgeon Dr. Mardelle Matte and she has an appt with them and they stated that they would give her one when she comes in.

## 2019-06-01 ENCOUNTER — Ambulatory Visit: Payer: Medicare Other

## 2019-06-02 ENCOUNTER — Ambulatory Visit (INDEPENDENT_AMBULATORY_CARE_PROVIDER_SITE_OTHER): Payer: Medicare Other

## 2019-06-02 ENCOUNTER — Other Ambulatory Visit: Payer: Self-pay

## 2019-06-02 DIAGNOSIS — Z0000 Encounter for general adult medical examination without abnormal findings: Secondary | ICD-10-CM | POA: Diagnosis not present

## 2019-06-02 NOTE — Patient Instructions (Addendum)
  Ms. Emily Wu , Thank you for taking time to come for your Medicare Wellness Visit. I appreciate your ongoing commitment to your health goals. Please review the following plan we discussed and let me know if I can assist you in the future.   These are the goals we discussed: Goals      Patient Stated   . Follow up with Primary Care Provider (pt-stated)     Keep all scheduled maintenance appointments  Follow up as needed       This is a list of the screening recommended for you and due dates:  Health Maintenance  Topic Date Due  . Mammogram  08/15/2016  . Flu Shot  06/25/2019  . Tetanus Vaccine  11/08/2023  . DEXA scan (bone density measurement)  Completed  . Pneumonia vaccines  Completed

## 2019-06-02 NOTE — Progress Notes (Addendum)
Subjective:   Emily Wu is a 85 y.o. female who presents for Medicare Annual (Subsequent) preventive examination.  Review of Systems:  No ROS.  Medicare Wellness Virtual Visit.  Visual/audio telehealth visit, UTA vital signs.   See social history for additional risk factors.   Cardiac Risk Factors include: advanced age (>3men, >38 women)     Objective:     Vitals: There were no vitals taken for this visit.  There is no height or weight on file to calculate BMI.  Advanced Directives 06/02/2019 01/27/2019 01/11/2019 12/08/2018 12/08/2018 11/29/2018 12/18/2017  Does Patient Have a Medical Advance Directive? Yes Yes Yes Yes Yes Yes Yes  Type of Paramedic of [place];Living will [place];Living will [place];Living will [place];Living will - [place];Living will [place];Living will  Does patient want to make changes to medical advance directive? No - Patient declined No - Patient declined No - Patient declined No - Patient declined - No - Patient declined -  Copy of [place] in Chart? No - copy requested - - No - copy requested - - No - copy requested    Tobacco Social History   Tobacco Use  Smoking Status Former Smoker  . Packs/day: 2.00  . Years: 7.00  . Pack years: 14.00  . Types: Cigarettes  Smokeless Tobacco Never Used  Tobacco Comment   02/03/2014 "quit smoking in the 1960's"     Counseling given: Not Answered Comment: 02/03/2014 "quit smoking in the 1960's"   Clinical Intake:  Pre-visit preparation completed: Yes        Diabetes: No  How often do you need to have someone help you when you read instructions, pamphlets, or other written materials from your doctor or pharmacy?: 1 - Never  Interpreter Needed?: No     Past Medical History:  Diagnosis Date  . Arthritis    "knees, shoulders, chest" (02/03/2014)  .  Bruises easily   . Cervical cancer ([place]) 1965  . High cholesterol   . History of kidney stones   . Hypertension    DENIES   . Tendency toward bleeding easily [facility])    Past Surgical History:  Procedure Laterality Date  . ABDOMINAL HYSTERECTOMY  1965  . APPENDECTOMY    . CHOLECYSTECTOMY    . KNEE ARTHROSCOPY Left    [place]  . PARTIAL KNEE ARTHROPLASTY Left 01/26/2019   Procedure: UNICOMPARTMENTAL KNEE;  Surgeon: Marchia Bond, MD;  Location: [place];  Service: Orthopedics;  Laterality: Left;  . SHOULDER OPEN ROTATOR CUFF REPAIR Bilateral 2007,  2010   [place]  . THROAT SURGERY  2014   "arthritis hugh knot" (02/03/2014)   Family History  Problem Relation Age of Onset  . Peripheral vascular disease Mother   . Stroke Father   . Hyperlipidemia Father   . Heart disease Father 8  . Cancer Neg Hx    Social History   Socioeconomic History  . Marital status: Married    Spouse name: Not on file  . Number of children: Not on file  . Years of education: Not on file  . Highest education level: Not on file  Occupational History  . Not on file  Social Needs  . Financial resource strain: Not hard at all  . Food insecurity    Worry: Never true    Inability: Never true  . Transportation needs    Medical: No  Non-medical: No  Tobacco Use  . Smoking status: Former Smoker    Packs/day: 2.00    Years: 7.00    Pack years: 14.00    Types: Cigarettes  . Smokeless tobacco: Never Used  . Tobacco comment: 02/03/2014 "quit smoking in the 1960's"  Substance and Sexual Activity  . Alcohol use: No  . Drug use: No  . Sexual activity: Never  Lifestyle  . Physical activity    Days per week: 0 days    Minutes per session: Not on file  . Stress: Only a little  Relationships  . Social Herbalist on phone: Not on file    Gets together: Not on file    Attends religious service: Not on file    Active member of club or organization: Not on file    Attends meetings of clubs or  organizations: Not on file    Relationship status: Not on file  Other Topics Concern  . Not on file  Social History Narrative  . Not on file    Outpatient Encounter Medications as of 06/02/2019  Medication Sig  . acetaminophen (TYLENOL) 650 MG CR tablet Take 650 mg by mouth every 8 (eight) hours as needed for pain.   Marland Kitchen amitriptyline (ELAVIL) 25 MG tablet TAKE 1 TABLET BY MOUTH DAILY (Patient taking differently: Take 25 mg by mouth at bedtime. )  . aspirin EC 325 MG tablet Take 1 tablet (325 mg total) by mouth 2 (two) times daily.  Marland Kitchen atorvastatin (LIPITOR) 20 MG tablet TAKE ONE TABLET EVERY DAY  . camphor-menthol (SARNA) lotion Apply 1 application topically daily as needed for itching.  . Cyanocobalamin (VITAMIN B-12 PO) Take 1 tablet by mouth daily.  . diclofenac sodium (VOLTAREN) 1 % GEL Apply 2 g topically 4 (four) times daily as needed (for knee pain).  . DOTTI 0.1 MG/24HR patch APPLY 1 PATCH ONTO THE SKIN TWICE WEEKLYAS DIRECTED  . escitalopram (LEXAPRO) 5 MG tablet TAKE ONE TABLET BY MOUTH EVERY DAY  . HYDROcodone-acetaminophen (NORCO) 5-325 MG tablet Take 1-2 tablets by mouth every 6 (six) hours as needed for moderate pain. MAXIMUM TOTAL ACETAMINOPHEN DOSE IS 4000 MG PER DAY  . liothyronine (CYTOMEL) 5 MCG tablet TAKE TWO TABLETS EVERY DAY  . meloxicam (MOBIC) 7.5 MG tablet TAKE 1 TABLET BY MOUTH DAILY AS NEEDED FOR JOINT PAIN  . Multiple Vitamins-Minerals (CENTRUM SILVER PO) Take 1 tablet by mouth daily.  . ondansetron (ZOFRAN) 4 MG tablet Take 1 tablet (4 mg total) by mouth every 8 (eight) hours as needed for nausea or vomiting.  . polyethylene glycol (MIRALAX / GLYCOLAX) packet Take 17 g by mouth daily as needed.  . protein supplement (RESOURCE BENEPROTEIN) POWD Take 1 scoop by mouth 3 (three) times daily as needed.  . sennosides-docusate sodium (SENOKOT-S) 8.6-50 MG tablet Take 2 tablets by mouth daily.   No facility-administered encounter medications on file as of 06/02/2019.      Activities of Daily Living In your present state of health, do you have any difficulty performing the following activities: 06/02/2019 01/27/2019  Hearing? N -  Vision? N -  Difficulty concentrating or making decisions? N -  Walking or climbing stairs? N -  Dressing or bathing? N -  Doing errands, shopping? N N  Preparing Food and eating ? Y -  Comment Daughter assists with meal prep. Self feeds. -  Using the Toilet? N -  In the past six months, have you accidently leaked urine? N -  Do you have problems with loss of bowel control? N -  Comment Wears daily brief. -  Managing your Medications? N -  Managing your Finances? N -  Comment Daughter assist as needed -  Housekeeping or managing your Housekeeping? Y -  Comment Maid assist -  Some recent data might be hidden    Patient Care Team: Crecencio Mc, MD as PCP - General (Internal Medicine)    Assessment:   This is a routine wellness examination for Emily Wu.  I connected with patient 06/02/19 at  1:00 PM EDT by an audio enabled telemedicine application and verified that I am speaking with the correct person using two identifiers. Patient stated full name and DOB. Patient gave permission to continue with virtual visit. Patient's location was at home and Nurse's location was at [place] office.   Health Screenings  Mammogram - 07/2015 Colonoscopy - 04/2017 Bone Density - 03/2015 Glaucoma -none Hearing -demonstrates normal hearing during visit. Followed by pcp Cholesterol - 09/2018 Dental- [facility]- she plans to schedule.   Social  Alcohol intake - no        Smoking history- former   Smokers in home? none Illicit drug use? none Physical activity- walking in the home Diet - regular Sexually Active -never BMI- discussed the importance of a healthy diet, water intake and the benefits of aerobic exercise.  Educational material provided.   Safety  Patient feels safe at home- yes Patient does have smoke detectors at home- yes  Patient does wear sunscreen or protective clothing when in direct sunlight -yes Patient does wear seat belt when in a moving vehicle -yes Patient drives- yes Life alert- no Primary caretaker of her husband.   Covid-19 precautions and sickness symptoms discussed.   Activities of Daily Living Patient denies needing assistance with: driving, household chores, feeding themselves, getting from bed to chair, getting to the toilet, bathing/showering, dressing, managing money, or preparing meals.  Maid and daughter assist with household chores and cooking.    Depression Screen Patient denies losing interest in daily life, feeling hopeless, or crying easily over simple problems.   Patient states she is doing better with home stressors . Her rest has improved and feels she is managing well with family support.   Medication-taking as directed and without issues.   Fall Screen Patient denies being afraid of falling or falling in the last year.   Memory Screen Patient is alert.  Correctly identified the president of the [place], season and recall. Patient likes to complete crossword puzzles for brain stimulation.  Immunizations The following Immunizations were discussed: Influenza, shingles, pneumonia, and tetanus.   Other Providers Patient Care Team: Crecencio Mc, MD as PCP - General (Internal Medicine)  Exercise Activities and Dietary recommendations Current Exercise Habits: The patient does not participate in regular exercise at present  Goals      Patient Stated   . Follow up with Primary Care Provider (pt-stated)     Keep all scheduled maintenance appointments  Follow up as needed       Fall Risk Fall Risk  06/02/2019 10/14/2018 08/10/2017 04/14/2016 01/09/2015  Falls in the past year? 0 0 Yes Yes Yes  Comment - Emmi Telephone Survey: data to providers prior to load - - -  Number falls in past yr: - - 2 or more 1 -  Injury with Fall? - - Yes Yes -  Risk for fall due to : - - -  Other (Comment) -  Follow up - - -  Falls prevention discussed -   Depression Screen PHQ 2/9 Scores 06/02/2019 12/24/2018 08/10/2017 04/14/2016  PHQ - 2 Score 1 1 0 0  PHQ- 9 Score - 2 5 -     Cognitive Function     6CIT Screen 06/02/2019  What Year? 0 points  What month? 0 points  What time? 0 points  Count back from 20 0 points  Months in reverse 0 points    Immunization History  Administered Date(s) Administered  . Pneumococcal Conjugate-13 01/18/2014  . Pneumococcal Polysaccharide-23 01/09/2015  . Tdap 11/07/2013  . Zoster 01/19/2012   Screening Tests Health Maintenance  Topic Date Due  . MAMMOGRAM  08/15/2016  . INFLUENZA VACCINE  06/25/2019  . TETANUS/TDAP  11/08/2023  . DEXA SCAN  Completed  . PNA vac Low Risk Adult  Completed      Plan:    End of life planning; Advance aging; Advanced directives discussed.  Copy of current HCPOA/Living Will requested.    I have personally reviewed and noted the following in the patient's chart:   . Medical and social history . Use of alcohol, tobacco or illicit drugs  . Current medications and supplements . Functional ability and status . Nutritional status . Physical activity . Advanced directives . List of other physicians . Hospitalizations, surgeries, and ER visits in previous 12 months . Vitals . Screenings to include cognitive, depression, and falls . Referrals and appointments  In addition, I have reviewed and discussed with patient certain preventive protocols, quality metrics, and best practice recommendations. A written personalized care plan for preventive services as well as general preventive health recommendations were provided to patient.     OBrien-Blaney, Denisa L, LPN  11/26/1436     I have reviewed the above information and agree with above.   Deborra Medina, MD

## 2019-06-04 ENCOUNTER — Other Ambulatory Visit: Payer: Self-pay | Admitting: Internal Medicine

## 2019-06-08 DIAGNOSIS — M170 Bilateral primary osteoarthritis of knee: Secondary | ICD-10-CM | POA: Diagnosis not present

## 2019-07-05 ENCOUNTER — Other Ambulatory Visit: Payer: Self-pay | Admitting: Internal Medicine

## 2019-07-21 ENCOUNTER — Other Ambulatory Visit: Payer: Self-pay | Admitting: Internal Medicine

## 2019-07-25 ENCOUNTER — Telehealth: Payer: Self-pay

## 2019-07-25 NOTE — Telephone Encounter (Signed)
Copied from [place] [id number]. Topic: General - Other >> Jul 25, 2019 11:42 AM Leward Quan A wrote: Reason for CRM: Patient would like an appointment with Dr Derrel Nip for a physical. Was scheduled for 07/27/2019 at 10.30 am but not able to do virtual visits. Per patient her husband has Dementia and have a care giver in the home between 10 am and 12 pm and that is her only break to be able to be seen. Please call patient at Ph# [phone] or [phone]

## 2019-07-26 ENCOUNTER — Other Ambulatory Visit: Payer: Self-pay

## 2019-07-27 ENCOUNTER — Ambulatory Visit (INDEPENDENT_AMBULATORY_CARE_PROVIDER_SITE_OTHER): Payer: Medicare Other

## 2019-07-27 ENCOUNTER — Ambulatory Visit (INDEPENDENT_AMBULATORY_CARE_PROVIDER_SITE_OTHER): Payer: Medicare Other | Admitting: Family Medicine

## 2019-07-27 ENCOUNTER — Encounter: Payer: Self-pay | Admitting: Family Medicine

## 2019-07-27 ENCOUNTER — Encounter: Payer: Medicare Other | Admitting: Internal Medicine

## 2019-07-27 ENCOUNTER — Other Ambulatory Visit: Payer: Self-pay

## 2019-07-27 VITALS — BP 124/60 | HR 82 | Temp 95.3°F | Resp 18 | Ht 62.5 in | Wt 122.4 lb

## 2019-07-27 DIAGNOSIS — W010XXA Fall on same level from slipping, tripping and stumbling without subsequent striking against object, initial encounter: Secondary | ICD-10-CM | POA: Diagnosis not present

## 2019-07-27 DIAGNOSIS — M25532 Pain in left wrist: Secondary | ICD-10-CM | POA: Diagnosis not present

## 2019-07-27 DIAGNOSIS — D170 Benign lipomatous neoplasm of skin and subcutaneous tissue of head, face and neck: Secondary | ICD-10-CM

## 2019-07-27 DIAGNOSIS — S6992XA Unspecified injury of left wrist, hand and finger(s), initial encounter: Secondary | ICD-10-CM | POA: Diagnosis not present

## 2019-07-27 LAB — BASIC METABOLIC PANEL
BUN: 18 mg/dL (ref 6–23)
CO2: 27 mEq/L (ref 19–32)
Calcium: 9 mg/dL (ref 8.4–10.5)
Chloride: 105 mEq/L (ref 96–112)
Creatinine, Ser: 1.05 mg/dL (ref 0.40–1.20)
GFR: 49.67 mL/min — ABNORMAL LOW (ref >60.00)
Glucose, Bld: 84 mg/dL (ref 70–99)
Potassium: 4.5 mEq/L (ref 3.5–5.1)
Sodium: 139 mEq/L (ref 135–145)

## 2019-07-27 LAB — CBC
HCT: 37.8 % (ref 36.0–46.0)
Hemoglobin: 12.7 g/dL (ref 12.0–15.0)
MCHC: 33.5 g/dL (ref 30.0–36.0)
MCV: 94.3 fl (ref 78.0–100.0)
Platelets: 232 10*3/uL (ref 150.0–400.0)
RBC: 4.01 Mil/uL (ref 3.87–5.11)
RDW: 14.1 % (ref 11.5–15.5)
WBC: 7.2 10*3/uL (ref 4.0–10.5)

## 2019-07-27 NOTE — Progress Notes (Signed)
Subjective:    Patient ID: Emily Wu, female    DOB: 12-11-1930, 85 y.o.   MRN: [id number]  HPI   Patient presents to clinic due to fall causing left wrist injury.  States she was outside in her flower bed and fell back, reached backwards to catch herself and bumped her wrist on the way down.  Wrist was swollen and bruised, swelling is gone down but still has some bruising.  Some pain with range of motion.  Patient also has a lump on back of neck, states it has been there for many months sometimes it bothers her other times it does not.   Patient also concerned that she seems to stumble at times, wondering if anything with her electrolytes or blood count is off causing this. Takes some time to get her bearings at times. Tries to keep self well-hydrated with water and balanced diet.  She does have increased stress in her life due to husband with dementia, does have home health coming in 6 to 8 hours a week for help with his care.    Patient Active Problem List   Diagnosis Date Noted  . S/P left unicompartmental knee replacement 01/26/2019  . Hospital discharge follow-up 12/26/2018  . Incidental pulmonary nodule, > 39mm and < 29mm 12/26/2018  . Decreased GFR 12/26/2018  . SOB (shortness of breath) 12/08/2018  . Left bundle branch block (LBBB) determined by electrocardiography 12/02/2018  . GERD (gastroesophageal reflux disease) 09/29/2018  . Anxiety disorder 03/25/2018  . Postprandial diarrhea 04/18/2017  . Chronic pain of left knee 05/27/2016  . Exertional dyspnea 04/15/2016  . Left carotid bruit 04/14/2016  . Menopausal hot flushes 08/07/2015  . H/O malignant neoplasm of skin 06/25/2015  . Hypothyroidism 01/31/2015  . History of cervical cancer 01/10/2015  . Preoperative evaluation of a medical condition to rule out surgical contraindications (TAR required) 01/10/2015  . Hyperlipidemia 02/03/2014  . Medicare annual wellness visit, subsequent 01/19/2014  . History of IBS  01/19/2014  . S/P breast augmentation 01/19/2014  . Primary localized osteoarthritis of left knee 07/06/2012  . Colon polyps 01/30/2012  . Low back pain potentially associated with radiculopathy 01/30/2012   Social History   Tobacco Use  . Smoking status: Former Smoker    Packs/day: 2.00    Years: 7.00    Pack years: 14.00    Types: Cigarettes  . Smokeless tobacco: Never Used  . Tobacco comment: 02/03/2014 "quit smoking in the 1960's"  Substance Use Topics  . Alcohol use: No   Review of Systems  Constitutional: Negative for chills, fatigue and fever.  HENT: Negative for congestion, ear pain, sinus pain and sore throat.   Eyes: Negative.   Respiratory: Negative for cough, shortness of breath and wheezing.   Cardiovascular: Negative for chest pain, palpitations and leg swelling.  Gastrointestinal: Negative for abdominal pain, diarrhea, nausea and vomiting.  Genitourinary: Negative for dysuria, frequency and urgency.  Musculoskeletal: wrist pain, Left Skin: +lump on back of neck Neurological: Negative for syncope, light-headedness and headaches.  Psychiatric/Behavioral: The patient is not nervous/anxious.       Objective:   Physical Exam Vitals signs reviewed.  Constitutional:      General: She is not in acute distress.    Appearance: She is not toxic-appearing.  HENT:     Head: Normocephalic and atraumatic.  Eyes:     General: No scleral icterus.    Extraocular Movements: Extraocular movements intact.     Pupils: Pupils are equal, round, and  reactive to light.  Neck:     Musculoskeletal: Normal range of motion and neck supple. No neck rigidity.      Comments: Small soft lump on back of neck, seems to be lipoma  Does not affect ROM Cardiovascular:     Rate and Rhythm: Normal rate and regular rhythm.  Pulmonary:     Effort: Pulmonary effort is normal. No respiratory distress.     Breath sounds: Normal breath sounds.  Musculoskeletal:     Right lower leg: No edema.      Left lower leg: No edema.     Comments: ROM wrist intact. Can move joint side to side, up and down. Can make tight fist and spread out all fingers.   Skin:    General: Skin is warm and dry.     Capillary Refill: Capillary refill takes less than 2 seconds.     Coloration: Skin is not jaundiced or pale.     Findings: Bruising (left wrist) present.  Neurological:     General: No focal deficit present.     Mental Status: She is alert and oriented to person, place, and time.     Gait: Gait normal.  Psychiatric:        Mood and Affect: Mood normal.        Behavior: Behavior normal.    Today's Vitals   07/27/19 1111  BP: 124/60  Pulse: 82  Resp: 18  Temp: (!) 95.3 F (35.2 C)  TempSrc: Temporal  SpO2: 95%  Weight: 122 lb 6.4 oz (55.5 kg)  Height: 5' 2.5" (1.588 m)   Body mass index is 22.03 kg/m.    Assessment & Plan:    A total of 25  minutes were spent face-to-face with the patient during this encounter and over half of that time was spent on counseling and coordination of care. The patient was counseled on work up for wrist, wearing a brace. Lab work to be done. Plan to refer to dermatology.    Fall due to stumbling/wrist pain-we will get x-ray of wrist to investigate and rule out fracture.  Patient already has wrist brace she purchased from the local pharmacy, will wear this for joint support.  Advised patient that bruising should resolve over time.  Also advised that she can use Tylenol as needed for pain.  We will get CBC and metabolic panel in clinic to investigate for any anemia or electrolyte abnormalities.  Discussed option of balance training in regards to some stumbling around the house, states she will think about it but not sure if she will be able to due to her husband needing care.  Lump on back of neck-suspect area is a lipoma, patient does request referral to dermatology for evaluation to see removal as an option.  Reassured patient that I do not feel area is  anything urgently concerning, advised we could also watch and wait and to see if the area increases in size.  Patient will keep all regular follow-up with PCP as planned.  She will return to clinic sooner if any issues arise.  Declines flu vaccine today

## 2019-08-16 ENCOUNTER — Other Ambulatory Visit: Payer: Self-pay

## 2019-08-18 ENCOUNTER — Ambulatory Visit (INDEPENDENT_AMBULATORY_CARE_PROVIDER_SITE_OTHER): Payer: Medicare Other | Admitting: Internal Medicine

## 2019-08-18 ENCOUNTER — Other Ambulatory Visit: Payer: Self-pay

## 2019-08-18 ENCOUNTER — Encounter: Payer: Self-pay | Admitting: Internal Medicine

## 2019-08-18 VITALS — BP 114/52 | HR 74 | Temp 96.2°F | Resp 15 | Ht 62.5 in | Wt 121.8 lb

## 2019-08-18 DIAGNOSIS — R7301 Impaired fasting glucose: Secondary | ICD-10-CM | POA: Diagnosis not present

## 2019-08-18 DIAGNOSIS — I447 Left bundle-branch block, unspecified: Secondary | ICD-10-CM | POA: Diagnosis not present

## 2019-08-18 DIAGNOSIS — I200 Unstable angina: Secondary | ICD-10-CM | POA: Diagnosis not present

## 2019-08-18 DIAGNOSIS — E034 Atrophy of thyroid (acquired): Secondary | ICD-10-CM | POA: Diagnosis not present

## 2019-08-18 DIAGNOSIS — D170 Benign lipomatous neoplasm of skin and subcutaneous tissue of head, face and neck: Secondary | ICD-10-CM | POA: Diagnosis not present

## 2019-08-18 DIAGNOSIS — Z96652 Presence of left artificial knee joint: Secondary | ICD-10-CM

## 2019-08-18 DIAGNOSIS — E7800 Pure hypercholesterolemia, unspecified: Secondary | ICD-10-CM

## 2019-08-18 DIAGNOSIS — R42 Dizziness and giddiness: Secondary | ICD-10-CM

## 2019-08-18 LAB — COMPREHENSIVE METABOLIC PANEL
ALT: 15 U/L (ref 0–35)
AST: 21 U/L (ref 0–37)
Albumin: 4.4 g/dL (ref 3.5–5.2)
Alkaline Phosphatase: 63 U/L (ref 39–117)
BUN: 18 mg/dL (ref 6–23)
CO2: 29 mEq/L (ref 19–32)
Calcium: 9.7 mg/dL (ref 8.4–10.5)
Chloride: 101 mEq/L (ref 96–112)
Creatinine, Ser: 1.16 mg/dL (ref 0.40–1.20)
GFR: 44.27 mL/min — ABNORMAL LOW (ref >60.00)
Glucose, Bld: 97 mg/dL (ref 70–99)
Potassium: 4.7 mEq/L (ref 3.5–5.1)
Sodium: 136 mEq/L (ref 135–145)
Total Bilirubin: 0.4 mg/dL (ref 0.2–1.2)
Total Protein: 7.2 g/dL (ref 6.0–8.3)

## 2019-08-18 LAB — LIPID PANEL
Cholesterol: 161 mg/dL (ref 0–200)
HDL: 41 mg/dL (ref >39.00)
NonHDL: 120.06
Total CHOL/HDL Ratio: 4
Triglycerides: 206 mg/dL — ABNORMAL HIGH (ref 0.0–149.0)
VLDL: 41.2 mg/dL — ABNORMAL HIGH (ref 0.0–40.0)

## 2019-08-18 LAB — CBC WITH DIFFERENTIAL/PLATELET
Basophils Absolute: 0 10*3/uL (ref 0.0–0.1)
Basophils Relative: 0.7 % (ref 0.0–3.0)
Eosinophils Absolute: 0.1 10*3/uL (ref 0.0–0.7)
Eosinophils Relative: 2 % (ref 0.0–5.0)
HCT: 42.2 % (ref 36.0–46.0)
Hemoglobin: 14 g/dL (ref 12.0–15.0)
Lymphocytes Relative: 35.5 % (ref 12.0–46.0)
Lymphs Abs: 2.5 10*3/uL (ref 0.7–4.0)
MCHC: 33.2 g/dL (ref 30.0–36.0)
MCV: 95.5 fl (ref 78.0–100.0)
Monocytes Absolute: 0.8 10*3/uL (ref 0.1–1.0)
Monocytes Relative: 11 % (ref 3.0–12.0)
Neutro Abs: 3.5 10*3/uL (ref 1.4–7.7)
Neutrophils Relative %: 50.8 % (ref 43.0–77.0)
Platelets: 269 10*3/uL (ref 150.0–400.0)
RBC: 4.42 Mil/uL (ref 3.87–5.11)
RDW: 13.7 % (ref 11.5–15.5)
WBC: 7 10*3/uL (ref 4.0–10.5)

## 2019-08-18 LAB — B12 AND FOLATE PANEL
Folate: >24.1 ng/mL (ref >5.9)
Vitamin B-12: 761 pg/mL (ref 211–911)

## 2019-08-18 LAB — TSH: TSH: 2.14 u[IU]/mL (ref 0.35–4.50)

## 2019-08-18 LAB — HEMOGLOBIN A1C: Hgb A1c MFr Bld: 5.8 % (ref 4.6–6.5)

## 2019-08-18 LAB — LDL CHOLESTEROL, DIRECT: Direct LDL: 90 mg/dL

## 2019-08-18 NOTE — Assessment & Plan Note (Signed)
She has recovered well but now has debilitating pain in the right knee.  No prior steroid injection,  Return in one week

## 2019-08-18 NOTE — Progress Notes (Signed)
Patient ID: Emily Wu, female    DOB: 1931-04-01  Age: 85 y.o. MRN: [id number]  The patient is here for examination and management of other chronic and acute problems.   The risk factors are reflected in the social history.  The roster of all physicians providing medical care to patient - is listed in the Snapshot section of the chart.  Activities of daily living:  The patient is 100% independent in all ADLs: dressing, toileting, feeding as well as independent mobility  Home safety : The patient has smoke detectors in the home. They wear seatbelts.  There are no firearms at home. There is no violence in the home.   There is no risks for hepatitis, STDs or HIV. There is no   history of blood transfusion. They have no travel history to infectious disease endemic areas of the world.  The patient has seen their dentist in the last six month. They have seen their eye doctor in the last year. They admit to slight hearing difficulty with regard to whispered voices and some television programs.  They have deferred audiologic testing in the last year.  They do not  have excessive sun exposure. Discussed the need for sun protection: hats, long sleeves and use of sunscreen if there is significant sun exposure.   Diet: the importance of a healthy diet is discussed. They do have a healthy diet.  The benefits of regular aerobic exercise were discussed. She walks Wu times per week ,  20 minutes.   Depression screen: there are no signs or vegative symptoms of depression- irritability, change in appetite, anhedonia, sadness/tearfullness.  Cognitive assessment: the patient manages all their financial and personal affairs and is actively engaged. They could relate day,date,year and events; recalled 2/3 objects at 3 minutes; performed clock-face test normally.  The following portions of the patient's history were reviewed and updated as appropriate: allergies, current medications, past family history, past  medical history,  past surgical history, past social history  and problem list.  Visual acuity was not assessed per patient preference since she has regular follow up with her ophthalmologist. Hearing and body mass index were assessed and reviewed.   During the course of the visit the patient was educated and counseled about appropriate screening and preventive services including : fall prevention , diabetes screening, nutrition counseling, colorectal cancer screening, and recommended immunizations.    CC: The primary encounter diagnosis was Dizziness. Diagnoses of Hypothyroidism due to acquired atrophy of thyroid, Pure hypercholesterolemia, Lipoma of skin and subcutaneous tissue of neck, Impaired fasting glucose, S/P left unicompartmental knee replacement, Left bundle branch block (LBBB) determined by electrocardiography, and Orthostatic dizziness were also pertinent to this visit.  1) bilateral knee pain.  She is s/p partial arthroplasty of left knee by Emily Wu,  Now only experiences pain on the medial side at  night if her right knee touches it while sleeping.  Her  right knee has been painful since the left knee surgery and she is requesting a steroid knee injection today.     2) breast ca screening: she has not had a mammogram since 2016 and is undecided about whether to resume screening   3) Recent fall:  She reports that fell backwards one month ago while working in Emily Wu garden , did not hit head on anything hard.  Had trouble gtting up because of her recent knee surgery  since then has had some postural dizziness without vertigo, just feels  off balance.  Aggravated by making a  sudden turn ,  Does not occur with walking.  She does her exercises lying down in bed   Wu)  chronic low back pain and knee pain  Requesting a handicapped sticker.  Marland Kitchen 5) 6 lb wt loss since Feb  Appetite excellent  6) feels hot all the time . Denies hot flashes, diarrhea, night sweats   7)  overdue for eye exam.  Glasses broke during her fall,  Needs  to see Emily Wu    8) LIPOMA left side of neck posteriorly on spine  Waiting for recently made derm referral but wants ot see Emily Wu   9) husband Emily Wu has been diagnosed with dementia Has some health aides to help  History Emily Wu has a past medical history of Arthritis, Bruises easily, Cervical cancer (Emily Wu) (1965), High cholesterol, History of kidney stones, Hypertension, and Tendency toward bleeding easily (Emily Wu).   She has a past surgical history that includes Knee arthroscopy (Left); Shoulder open rotator cuff repair (Bilateral, 2007,  2010); Abdominal hysterectomy (1965); Appendectomy; Cholecystectomy; Throat surgery (2014); and Partial knee arthroplasty (Left, 3/Wu/2020).   Her family history includes Heart disease (age of onset: 66) in her father; Hyperlipidemia in her father; Peripheral vascular disease in her mother; Stroke in her father.She reports that she has quit smoking. Her smoking use included cigarettes. She has a 14.00 pack-year smoking history. She has never used smokeless tobacco. She reports that she does not drink alcohol or use drugs.  Outpatient Medications Prior to Visit  Medication Sig Dispense Refill  . acetaminophen (TYLENOL) 650 MG CR tablet Take 650 mg by mouth every 8 (eight) hours as needed for pain.     Marland Kitchen amitriptyline (ELAVIL) 25 MG tablet Take 1 tablet (25 mg total) by mouth at bedtime. Please call for an appt prior to next refill 90 tablet 0  . atorvastatin (LIPITOR) 20 MG tablet TAKE ONE TABLET EVERY DAY 90 tablet 1  . camphor-menthol (SARNA) lotion Apply 1 application topically daily as needed for itching.    . Cyanocobalamin (VITAMIN B-12 PO) Take 1 tablet by mouth daily.    . diclofenac sodium (VOLTAREN) 1 % GEL Apply 2 g topically Wu (four) times daily as needed (for knee pain). 200 g 11  . DOTTI 0.1 MG/24HR patch APPLY 1 PATCH ONTO THE SKIN TWICE WEEKLYAS DIRECTED 8 patch 1  . escitalopram  (LEXAPRO) 5 MG tablet TAKE ONE TABLET BY MOUTH EVERY DAY 90 tablet 0  . liothyronine (CYTOMEL) 5 MCG tablet TAKE 2 TABLETS BY MOUTH DAILY 180 tablet 0  . meloxicam (MOBIC) 7.5 MG tablet TAKE 1 TABLET BY MOUTH DAILY AS NEEDED FOR JOINT PAIN 90 tablet 1  . Multiple Vitamins-Minerals (CENTRUM SILVER PO) Take 1 tablet by mouth daily.    . ondansetron (ZOFRAN) Wu MG tablet Take 1 tablet (Wu mg total) by mouth every 8 (eight) hours as needed for nausea or vomiting. 10 tablet 0  . polyethylene glycol (MIRALAX / GLYCOLAX) packet Take 17 g by mouth daily as needed.    . protein supplement (RESOURCE BENEPROTEIN) POWD Take 1 scoop by mouth 3 (three) times daily as needed.    . sennosides-docusate sodium (SENOKOT-S) 8.6-50 MG tablet Take 2 tablets by mouth daily. 30 tablet 1  . HYDROcodone-acetaminophen (NORCO) 5-325 MG tablet Take 1-2 tablets by mouth every 6 (six) hours as needed for moderate pain. MAXIMUM TOTAL ACETAMINOPHEN DOSE IS 4000 MG PER DAY (Patient not taking: Reported on 08/18/2019) 30 tablet 0  .  aspirin EC 325 MG tablet Take 1 tablet (325 mg total) by mouth 2 (two) times daily. (Patient not taking: Reported on 08/18/2019) 60 tablet 0   No facility-administered medications prior to visit.     Review of Systems  Patient denies headache, fevers, malaise, unintentional weight loss, skin rash, eye pain, sinus congestion and sinus pain, sore throat, dysphagia,  hemoptysis , cough, dyspnea, wheezing, chest pain, palpitations, orthopnea, edema, abdominal pain, nausea, melena, diarrhea, constipation, flank pain, dysuria, hematuria, urinary  Frequency, nocturia, numbness, tingling, seizures,  Focal weakness, Loss of consciousness,  Tremor, insomnia, depression, anxiety, and suicidal ideation.     Objective:  BP (!) 114/52 (BP Location: Left Arm, Patient Position: Sitting, Cuff Size: Normal)   Pulse 74   Temp (!) 96.2 F (35.7 C) (Temporal)   Resp 15   Ht 5' 2.5" (1.588 m)   Wt 121 lb 12.8 oz (55.2 kg)    SpO2 99%   BMI 21.92 kg/m   Physical Exam  General appearance: alert, cooperative and appears stated age Head: Normocephalic, without obvious abnormality, atraumatic Eyes: conjunctivae/corneas clear. PERRL, EOM's intact. Fundi benign. Ears: normal TM's and external ear canals both ears Nose: Nares normal. Septum midline. Mucosa normal. No drainage or sinus tenderness. Throat: lips, mucosa, and tongue normal; teeth and gums normal Neck: no adenopathy, no carotid bruit, no JVD, supple, symmetrical, trachea midline and thyroid not enlarged, symmetric, no tenderness/mass/nodules Lungs: clear to auscultation bilaterally Breasts: normal appearance, no masses or tenderness Heart: regular rate and rhythm, S1, S2 normal, no murmur, click, rub or gallop Abdomen: soft, non-tender; bowel sounds normal; no masses,  no organomegaly Extremities: extremities normal, atraumatic, no cyanosis or edema Pulses: 2+ and symmetric Skin: Skin color, texture, turgor normal. No rashes or lesions Neurologic: Alert and oriented X 3, normal strength and tone. Normal symmetric reflexes. Normal coordination and gait.     Assessment & Plan:   Problem List Items Addressed This Visit      Unprioritized   Hyperlipidemia    Well controlled on current statin therapy by fasting labs today . Liver enzymes were normal,  no changes today.  Lab Results  Component Value Date   CHOL 161 08/18/2019   HDL 41.00 08/18/2019   LDLCALC 73 03/19/2018   LDLDIRECT 90.0 08/18/2019   TRIG 206.0 (H) 08/18/2019   CHOLHDL Wu 08/18/2019     Lab Results  Component Value Date   ALT 15 08/18/2019   AST 21 08/18/2019   ALKPHOS 63 08/18/2019   BILITOT 0.Wu 08/18/2019  \            Relevant Orders   Comprehensive metabolic panel (Completed)   Lipid panel (Completed)   Hypothyroidism    Thyroid function is WNL on current dose.  No current changes needed.   Lab Results  Component Value Date   TSH 2.14 08/18/2019          Relevant Orders   TSH (Completed)   Left bundle branch block (LBBB) determined by electrocardiography    She has had a noninvasive cardiac workup during recent admission for left arm pain which was noncardiac in nature.  Stress test and ECHO were normal      S/P left unicompartmental knee replacement    She has recovered well but now has debilitating pain in the right knee.  No prior steroid injection,  Return in one week       Orthostatic dizziness    Anemia, B12 and folate deficiencies ruled out.  Mildly  orthostatic by my check. Dietary  Salt liberalization recommended   Lab Results  Component Value Date   [id number] 08/18/2019   Lab Results  Component Value Date   FOLATE >24.1 08/18/2019   Lab Results  Component Value Date   WBC 7.0 08/18/2019   HGB 14.0 08/18/2019   HCT 42.2 08/18/2019   MCV 95.5 08/18/2019   PLT 269.0 08/18/2019          Other Visit Diagnoses    Dizziness    -  Primary   Relevant Orders   B12 and Folate Panel (Completed)   CBC with Differential/Platelet (Completed)   Lipoma of skin and subcutaneous tissue of neck       Relevant Orders   Ambulatory referral to Dermatology   Impaired fasting glucose       Relevant Orders   Hemoglobin A1c (Completed)      I have discontinued Jerene Pitch. Nebergall's aspirin EC. I am also having her maintain her acetaminophen, Cyanocobalamin (VITAMIN B-12 PO), camphor-menthol, diclofenac sodium, Multiple Vitamins-Minerals (CENTRUM SILVER PO), polyethylene glycol, protein supplement, HYDROcodone-acetaminophen, ondansetron, sennosides-docusate sodium, atorvastatin, meloxicam, Dotti, escitalopram, amitriptyline, and liothyronine.  No orders of the defined types were placed in this encounter.   Medications Discontinued During This Encounter  Medication Reason  . aspirin EC 325 MG tablet Patient has not taken in last 30 days    Follow-up: Return in about 1 week (around 08/25/2019).   Crecencio Mc, MD

## 2019-08-18 NOTE — Assessment & Plan Note (Signed)
She has had a noninvasive cardiac workup during recent admission for left arm pain which was noncardiac in nature.  Stress test and ECHO were normal

## 2019-08-18 NOTE — Assessment & Plan Note (Signed)
Anemia, B12 and folate deficiencies ruled out.  Mildly orthostatic by my check. Dietary  Salt liberalization recommended   Lab Results  Component Value Date   [id number] 08/18/2019   Lab Results  Component Value Date   FOLATE >24.1 08/18/2019   Lab Results  Component Value Date   WBC 7.0 08/18/2019   HGB 14.0 08/18/2019   HCT 42.2 08/18/2019   MCV 95.5 08/18/2019   PLT 269.0 08/18/2019

## 2019-08-18 NOTE — Assessment & Plan Note (Signed)
Well controlled on current statin therapy by fasting labs today . Liver enzymes were normal,  no changes today.  Lab Results  Component Value Date   CHOL 161 08/18/2019   HDL 41.00 08/18/2019   LDLCALC 73 03/19/2018   LDLDIRECT 90.0 08/18/2019   TRIG 206.0 (H) 08/18/2019   CHOLHDL 4 08/18/2019     Lab Results  Component Value Date   ALT 15 08/18/2019   AST 21 08/18/2019   ALKPHOS 63 08/18/2019   BILITOT 0.4 08/18/2019  \

## 2019-08-18 NOTE — Patient Instructions (Addendum)
Return to Dr Matilde Sprang to have your vision tested  If you decide you want to have a mammogram.  Let me know   Hypotension As your heart beats, it forces blood through your body. This force is called blood pressure. If you have hypotension, you have low blood pressure. When your blood pressure is too low, you may not get enough blood to your brain or other parts of your body. This may cause you to feel weak, light-headed, have a fast heartbeat, or even pass out (faint). Low blood pressure may be harmless, or it may cause serious problems. What are the causes?  Blood loss.  Not enough water in the body (dehydration).  Heart problems.  Hormone problems.  Pregnancy.  A very bad infection.  Not having enough of certain nutrients.  Very bad allergic reactions.  Certain medicines. What increases the risk?  Age. The risk increases as you get older.  Conditions that affect the heart or the brain and spinal cord (central nervous system).  Taking certain medicines.  Being pregnant. What are the signs or symptoms?  Feeling: ? Weak. ? Light-headed. ? Dizzy. ? Tired (fatigued).  Blurred vision.  Fast heartbeat.  Passing out, in very bad cases. How is this treated?  Changing your diet. This may involve eating more salt (sodium) or drinking more water.  Taking medicines to raise your blood pressure.  Changing how much you take (the dosage) of some of your medicines.  Wearing compression stockings. These stockings help to prevent blood clots and reduce swelling in your legs. In some cases, you may need to go to the hospital for:  Fluid replacement. This means you will receive fluids through an IV tube.  Blood replacement. This means you will receive donated blood through an IV tube (transfusion).  Treating an infection or heart problems, if this applies.  Monitoring. You may need to be monitored while medicines that you are taking wear off. Follow these instructions at  home: Eating and drinking   Drink enough fluids to keep your pee (urine) pale yellow.  Eat a healthy diet. Follow instructions from your doctor about what you can eat or drink. A healthy diet includes: ? Fresh fruits and vegetables. ? Whole grains. ? Low-fat (lean) meats. ? Low-fat dairy products.  Eat extra salt only as told. Do not add extra salt to your diet unless your doctor tells you to.  Eat small meals often.  Avoid standing up quickly after you eat. Medicines  Take over-the-counter and prescription medicines only as told by your doctor. ? Follow instructions from your doctor about changing how much you take of your medicines, if this applies. ? Do not stop or change any of your medicines on your own. General instructions   Wear compression stockings as told by your doctor.  Get up slowly from lying down or sitting.  Avoid hot showers and a lot of heat as told by your doctor.  Return to your normal activities as told by your doctor. Ask what activities are safe for you.  Do not use any products that contain nicotine or tobacco, such as cigarettes, e-cigarettes, and chewing tobacco. If you need help quitting, ask your doctor.  Keep all follow-up visits as told by your doctor. This is important. Contact a doctor if:  You throw up (vomit).  You have watery poop (diarrhea).  You have a fever for more than 2-3 days.  You feel more thirsty than normal.  You feel weak and tired. Get  help right away if:  You have chest pain.  You have a fast or uneven heartbeat.  You lose feeling (have numbness) in any part of your body.  You cannot move your arms or your legs.  You have trouble talking.  You get sweaty or feel light-headed.  You pass out.  You have trouble breathing.  You have trouble staying awake.  You feel mixed up (confused). Summary  Hypotension is also called low blood pressure. It is when the force of blood pumping through your arteries is  too weak.  Hypotension may be harmless, or it may cause serious problems.  Treatment may include changing your diet and medicines, and wearing compression stockings.  In very bad cases, you may need to go to the hospital. This information is not intended to replace advice given to you by your health care provider. Make sure you discuss any questions you have with your health care provider. Document Released: 02/04/2010 Document Revised: 05/06/2018 Document Reviewed: 05/06/2018 Elsevier Patient Education  2020 [employer].

## 2019-08-18 NOTE — Assessment & Plan Note (Signed)
Thyroid function is WNL on current dose.  No current changes needed.   Lab Results  Component Value Date   TSH 2.14 08/18/2019

## 2019-08-24 DIAGNOSIS — L298 Other pruritus: Secondary | ICD-10-CM | POA: Diagnosis not present

## 2019-08-24 DIAGNOSIS — Z08 Encounter for follow-up examination after completed treatment for malignant neoplasm: Secondary | ICD-10-CM | POA: Diagnosis not present

## 2019-08-24 DIAGNOSIS — D170 Benign lipomatous neoplasm of skin and subcutaneous tissue of head, face and neck: Secondary | ICD-10-CM | POA: Diagnosis not present

## 2019-08-24 DIAGNOSIS — L821 Other seborrheic keratosis: Secondary | ICD-10-CM | POA: Diagnosis not present

## 2019-08-24 DIAGNOSIS — Z85828 Personal history of other malignant neoplasm of skin: Secondary | ICD-10-CM | POA: Diagnosis not present

## 2019-08-24 DIAGNOSIS — L820 Inflamed seborrheic keratosis: Secondary | ICD-10-CM | POA: Diagnosis not present

## 2019-08-25 ENCOUNTER — Encounter: Payer: Self-pay | Admitting: Internal Medicine

## 2019-08-25 ENCOUNTER — Other Ambulatory Visit: Payer: Self-pay

## 2019-08-25 ENCOUNTER — Other Ambulatory Visit (INDEPENDENT_AMBULATORY_CARE_PROVIDER_SITE_OTHER): Payer: Medicare Other

## 2019-08-25 ENCOUNTER — Ambulatory Visit (INDEPENDENT_AMBULATORY_CARE_PROVIDER_SITE_OTHER): Payer: Medicare Other | Admitting: Internal Medicine

## 2019-08-25 VITALS — BP 120/54 | HR 82 | Resp 16 | Ht 62.5 in | Wt 121.4 lb

## 2019-08-25 DIAGNOSIS — G8929 Other chronic pain: Secondary | ICD-10-CM | POA: Diagnosis not present

## 2019-08-25 DIAGNOSIS — M1711 Unilateral primary osteoarthritis, right knee: Secondary | ICD-10-CM

## 2019-08-25 DIAGNOSIS — M25561 Pain in right knee: Secondary | ICD-10-CM | POA: Diagnosis not present

## 2019-08-25 DIAGNOSIS — R944 Abnormal results of kidney function studies: Secondary | ICD-10-CM | POA: Diagnosis not present

## 2019-08-25 LAB — BASIC METABOLIC PANEL
BUN: 19 mg/dL (ref 6–23)
CO2: 28 mEq/L (ref 19–32)
Calcium: 9.6 mg/dL (ref 8.4–10.5)
Chloride: 102 mEq/L (ref 96–112)
Creatinine, Ser: 1.05 mg/dL (ref 0.40–1.20)
GFR: 49.66 mL/min — ABNORMAL LOW (ref >60.00)
Glucose, Bld: 73 mg/dL (ref 70–99)
Potassium: 3.9 mEq/L (ref 3.5–5.1)
Sodium: 138 mEq/L (ref 135–145)

## 2019-08-25 MED ORDER — LIDOCAINE HCL 1 % IJ SOLN
4.0000 mL | Freq: Once | INTRAMUSCULAR | Status: AC
  Administered 2019-08-25: 4 mL

## 2019-08-25 MED ORDER — TRIAMCINOLONE ACETONIDE 40 MG/ML IJ SUSP
40.0000 mg | Freq: Once | INTRAMUSCULAR | Status: AC
  Administered 2019-08-25: 40 mg via INTRAMUSCULAR

## 2019-08-25 NOTE — Progress Notes (Signed)
Subjective:  Patient ID: Emily Wu, female    DOB: Jul 26, 1931  Age: 85 y.o. MRN: [id number]  CC: The primary encounter diagnosis was Chronic pain of right knee. A diagnosis of Tricompartment osteoarthritis of right knee was also pertinent to this visit.  HPI [name] presents for right knee injection .  She denies fevers , redness or swelling of the knee.  She has chronic pain secondary to DJD.    Outpatient Medications Prior to Visit  Medication Sig Dispense Refill  . acetaminophen (TYLENOL) 650 MG CR tablet Take 650 mg by mouth every 8 (eight) hours as needed for pain.     Marland Kitchen amitriptyline (ELAVIL) 25 MG tablet Take 1 tablet (25 mg total) by mouth at bedtime. Please call for an appt prior to next refill 90 tablet 0  . atorvastatin (LIPITOR) 20 MG tablet TAKE ONE TABLET EVERY DAY 90 tablet 1  . camphor-menthol (SARNA) lotion Apply 1 application topically daily as needed for itching.    . Cyanocobalamin (VITAMIN B-12 PO) Take 1 tablet by mouth daily.    . diclofenac sodium (VOLTAREN) 1 % GEL Apply 2 g topically 4 (four) times daily as needed (for knee pain). 200 g 11  . DOTTI 0.1 MG/24HR patch APPLY 1 PATCH ONTO THE SKIN TWICE WEEKLYAS DIRECTED 8 patch 1  . escitalopram (LEXAPRO) 5 MG tablet TAKE ONE TABLET BY MOUTH EVERY DAY 90 tablet 0  . HYDROcodone-acetaminophen (NORCO) 5-325 MG tablet Take 1-2 tablets by mouth every 6 (six) hours as needed for moderate pain. MAXIMUM TOTAL ACETAMINOPHEN DOSE IS 4000 MG PER DAY 30 tablet 0  . liothyronine (CYTOMEL) 5 MCG tablet TAKE 2 TABLETS BY MOUTH DAILY 180 tablet 0  . meloxicam (MOBIC) 7.5 MG tablet TAKE 1 TABLET BY MOUTH DAILY AS NEEDED FOR JOINT PAIN 90 tablet 1  . Multiple Vitamins-Minerals (CENTRUM SILVER PO) Take 1 tablet by mouth daily.    . ondansetron (ZOFRAN) 4 MG tablet Take 1 tablet (4 mg total) by mouth every 8 (eight) hours as needed for nausea or vomiting. 10 tablet 0  . polyethylene glycol (MIRALAX / GLYCOLAX) packet Take  17 g by mouth daily as needed.    . protein supplement (RESOURCE BENEPROTEIN) POWD Take 1 scoop by mouth 3 (three) times daily as needed.    . sennosides-docusate sodium (SENOKOT-S) 8.6-50 MG tablet Take 2 tablets by mouth daily. 30 tablet 1   No facility-administered medications prior to visit.     Review of Systems;  Patient denies headache, fevers, malaise, unintentional weight loss, skin rash, eye pain, sinus congestion and sinus pain, sore throat, dysphagia,  hemoptysis , cough, dyspnea, wheezing, chest pain, palpitations, orthopnea, edema, abdominal pain, nausea, melena, diarrhea, constipation, flank pain, dysuria, hematuria, urinary  Frequency, nocturia, numbness, tingling, seizures,  Focal weakness, Loss of consciousness,  Tremor, insomnia, depression, anxiety, and suicidal ideation.      Objective:  BP (!) 120/54 (BP Location: Left Arm, Patient Position: Sitting, Cuff Size: Normal)   Pulse 82   Resp 16   Ht 5' 2.5" (1.588 m)   Wt 121 lb 6.4 oz (55.1 kg)   SpO2 98%   BMI 21.85 kg/m   BP Readings from Last 3 Encounters:  08/25/19 (!) 120/54  08/18/19 (!) 114/52  07/27/19 124/60    Wt Readings from Last 3 Encounters:  08/25/19 121 lb 6.4 oz (55.1 kg)  08/18/19 121 lb 12.8 oz (55.2 kg)  07/27/19 122 lb 6.4 oz (55.5 kg)  General appearance: alert, cooperative and appears stated age Lungs: clear to auscultation bilaterally Heart: regular rate and rhythm, S1, S2 normal, no murmur, click, rub or gallop Abdomen: soft, non-tender; bowel sounds normal; no masses,  no organomegaly Pulses: 2+ and symmetric Skin: Skin color, texture, turgor normal. No rashes or lesions MSK:  Right knee without erythema, effusion or open wound.   Lab Results  Component Value Date   HGBA1C 5.8 08/18/2019    Lab Results  Component Value Date   CREATININE 1.05 08/25/2019   CREATININE 1.16 08/18/2019   CREATININE 1.05 07/27/2019    Lab Results  Component Value Date   WBC 7.0  08/18/2019   HGB 14.0 08/18/2019   HCT 42.2 08/18/2019   PLT 269.0 08/18/2019   GLUCOSE 73 08/25/2019   CHOL 161 08/18/2019   TRIG 206.0 (H) 08/18/2019   HDL 41.00 08/18/2019   LDLDIRECT 90.0 08/18/2019   LDLCALC 73 03/19/2018   ALT 15 08/18/2019   AST 21 08/18/2019   NA 138 08/25/2019   K 3.9 08/25/2019   CL 102 08/25/2019   CREATININE 1.05 08/25/2019   BUN 19 08/25/2019   CO2 28 08/25/2019   TSH 2.14 08/18/2019   INR 1.04 12/08/2018   HGBA1C 5.8 08/18/2019    Dg Knee Left Port  Result Date: 01/26/2019 CLINICAL DATA:  Status post left knee unicompartmental prosthesis placement. EXAM: PORTABLE LEFT KNEE - 1-2 VIEW COMPARISON:  None. FINDINGS: Portable AP and lateral views of the left knee demonstrate a medial compartment prosthesis in satisfactory position and alignment. No fracture or dislocation is seen. Moderate lateral and patellofemoral spur formation is noted as well as a proximal anterior patellar enthesophyte. IMPRESSION: 1. Satisfactory postoperative appearance of a medial compartment prosthesis. 2. Moderate lateral and patellofemoral degenerative changes. Electronically Signed   By: Claudie Revering M.D.   On: 01/26/2019 17:17    Assessment & Plan:   Problem List Items Addressed This Visit      Unprioritized   Tricompartment osteoarthritis of right knee     Informed consent for right knee  joint injection obtained.  Area was cleaned with betadine.  Medial subpatellar bursa was  injected with  40 mg (1 ml) Kenalog and 4 ml of 1% Xylocaine .  Procedure was tolerated well and knee r was feeling less painful by the time she left the office.  She was advised to rest the knee for 48 hours , apply ice packs every 6 hours for 15 minutes, advised to call if she develops signs of infection         Other Visit Diagnoses    Chronic pain of right knee    -  Primary   Relevant Medications   lidocaine (XYLOCAINE) 1 % (with pres) injection 4 mL (Completed)   triamcinolone acetonide  (KENALOG-40) injection 40 mg (Completed)      I am having Jerene Pitch. Lesch maintain her acetaminophen, Cyanocobalamin (VITAMIN B-12 PO), camphor-menthol, diclofenac sodium, Multiple Vitamins-Minerals (CENTRUM SILVER PO), polyethylene glycol, protein supplement, HYDROcodone-acetaminophen, ondansetron, sennosides-docusate sodium, atorvastatin, meloxicam, Dotti, escitalopram, amitriptyline, and liothyronine. We administered lidocaine and triamcinolone acetonide.  Meds ordered this encounter  Medications  . lidocaine (XYLOCAINE) 1 % (with pres) injection 4 mL  . triamcinolone acetonide (KENALOG-40) injection 40 mg    There are no discontinued medications.  Follow-up: No follow-ups on file.   Crecencio Mc, MD

## 2019-08-26 ENCOUNTER — Other Ambulatory Visit: Payer: Self-pay | Admitting: Internal Medicine

## 2019-08-26 DIAGNOSIS — M1711 Unilateral primary osteoarthritis, right knee: Secondary | ICD-10-CM | POA: Insufficient documentation

## 2019-08-26 NOTE — Assessment & Plan Note (Signed)
Informed consent for right knee  joint injection obtained.  Area was cleaned with betadine.  Medial subpatellar bursa was  injected with  40 mg (1 ml) Kenalog and 4 ml of 1% Xylocaine .  Procedure was tolerated well and knee r was feeling less painful by the time she left the office.  She was advised to rest the knee for 48 hours , apply ice packs every 6 hours for 15 minutes, advised to call if she develops signs of infection

## 2019-08-28 NOTE — Addendum Note (Signed)
Addended by: Crecencio Mc on: 08/28/2019 09:51 AM   Modules accepted: Orders

## 2019-09-07 DIAGNOSIS — H524 Presbyopia: Secondary | ICD-10-CM | POA: Diagnosis not present

## 2019-09-07 DIAGNOSIS — H52223 Regular astigmatism, bilateral: Secondary | ICD-10-CM | POA: Diagnosis not present

## 2019-09-07 DIAGNOSIS — H25013 Cortical age-related cataract, bilateral: Secondary | ICD-10-CM | POA: Diagnosis not present

## 2019-09-07 DIAGNOSIS — H43813 Vitreous degeneration, bilateral: Secondary | ICD-10-CM | POA: Diagnosis not present

## 2019-09-07 DIAGNOSIS — H2513 Age-related nuclear cataract, bilateral: Secondary | ICD-10-CM | POA: Diagnosis not present

## 2019-09-07 DIAGNOSIS — H5203 Hypermetropia, bilateral: Secondary | ICD-10-CM | POA: Diagnosis not present

## 2019-09-21 ENCOUNTER — Ambulatory Visit: Payer: Self-pay

## 2019-09-21 ENCOUNTER — Other Ambulatory Visit: Payer: Self-pay

## 2019-09-21 NOTE — Telephone Encounter (Signed)
Pt. Reports last night she went to lay down in bed and had a sharp pain in her neck that went up to her right ear and head. Pain went away. Happened again today while she was taking a nap. No fever or radiation of pain. Warm transfer to [facility] in the practice for a visit.  Answer Assessment - Initial Assessment Questions 1. ONSET: "When did the pain begin?"      Last night 2. LOCATION: "Where does it hurt?"      Right side of neck 3. PATTERN "Does the pain come and go, or has it been constant since it started?"      Comes and goes 4. SEVERITY: "How bad is the pain?"  (Scale 1-10; or mild, moderate, severe)   - MILD (1-3): doesn't interfere with normal activities    - MODERATE (4-7): interferes with normal activities or awakens from sleep    - SEVERE (8-10):  excruciating pain, unable to do any normal activities      6  With laying down 5. RADIATION: "Does the pain go anywhere else, shoot into your arms?"     No 6. CORD SYMPTOMS: "Any weakness or numbness of the arms or legs?"     No 7. CAUSE: "What do you think is causing the neck pain?"     Unsure 8. NECK OVERUSE: "Any recent activities that involved turning or twisting the neck?"     No 9. OTHER SYMPTOMS: "Do you have any other symptoms?" (e.g., headache, fever, chest pain, difficulty breathing, neck swelling)     No 10. PREGNANCY: "Is there any chance you are pregnant?" "When was your last menstrual period?"       No  Protocols used: NECK PAIN OR STIFFNESS-A-AH

## 2019-09-22 ENCOUNTER — Other Ambulatory Visit: Payer: Self-pay

## 2019-09-22 ENCOUNTER — Encounter: Payer: Self-pay | Admitting: Family Medicine

## 2019-09-22 ENCOUNTER — Ambulatory Visit (INDEPENDENT_AMBULATORY_CARE_PROVIDER_SITE_OTHER): Payer: Medicare Other

## 2019-09-22 ENCOUNTER — Ambulatory Visit (INDEPENDENT_AMBULATORY_CARE_PROVIDER_SITE_OTHER): Payer: Medicare Other | Admitting: Family Medicine

## 2019-09-22 VITALS — BP 130/70 | HR 83 | Temp 96.6°F | Ht 62.5 in | Wt 123.0 lb

## 2019-09-22 DIAGNOSIS — M542 Cervicalgia: Secondary | ICD-10-CM

## 2019-09-22 MED ORDER — METHYLPREDNISOLONE 4 MG PO TBPK: ORAL_TABLET | ORAL | 0 refills | Status: DC

## 2019-09-22 NOTE — Patient Instructions (Signed)

## 2019-09-22 NOTE — Progress Notes (Signed)
Subjective:    Patient ID: Emily Wu, female    DOB: [birth year], 85 y.o.   MRN: [id number]  HPI   Patient presents to clinic complaining of neck pain.  States she noticed it especially when going to lay down, causes severe pain from base of head down to upper back.  States she is hesitant to turn neck side to side or up and down due to pain and aching in her neck.  Patient does report history of arthritis in other joints, so believes it is possibly arthritis in the neck as well.  Took Tylenol and also a dose of Aleve with help to pain minimally.  Patient Active Problem List   Diagnosis Date Noted  . Tricompartment osteoarthritis of right knee 08/26/2019  . Orthostatic dizziness 08/18/2019  . S/P left unicompartmental knee replacement 01/26/2019  . Hospital discharge follow-up 12/26/2018  . Incidental pulmonary nodule, > 2mm and < 44mm 12/26/2018  . Decreased GFR 12/26/2018  . Left bundle branch block (LBBB) determined by electrocardiography 12/02/2018  . GERD (gastroesophageal reflux disease) 09/29/2018  . Anxiety disorder 03/25/2018  . Chronic pain of left knee 05/27/2016  . Left carotid bruit 04/14/2016  . Menopausal hot flushes 08/07/2015  . H/O malignant neoplasm of skin 06/25/2015  . Hypothyroidism 01/31/2015  . History of cervical cancer 01/10/2015  . Hyperlipidemia 02/03/2014  . Medicare annual wellness visit, subsequent 01/19/2014  . History of IBS 01/19/2014  . S/P breast augmentation 01/19/2014  . Primary localized osteoarthritis of left knee 07/06/2012  . Colon polyps 01/30/2012  . Low back pain potentially associated with radiculopathy 01/30/2012   Social History   Tobacco Use  . Smoking status: Former Smoker    Packs/day: 2.00    Years: 7.00    Pack years: 14.00    Types: Cigarettes  . Smokeless tobacco: Never Used  . Tobacco comment: 02/03/2014 "quit smoking in the 1960's"  Substance Use Topics  . Alcohol use: No   Review of Systems   Constitutional: Negative for chills, fatigue and fever.  HENT: Negative for congestion, ear pain, sinus pain and sore throat.   Eyes: Negative.   Respiratory: Negative for cough, shortness of breath and wheezing.   Cardiovascular: Negative for chest pain, palpitations and leg swelling.  Gastrointestinal: Negative for abdominal pain, diarrhea, nausea and vomiting.  Genitourinary: Negative for dysuria, frequency and urgency.  Musculoskeletal: +neck pain  Skin: Negative for color change, pallor and rash.  Neurological: Negative for syncope, light-headedness and headaches.  Psychiatric/Behavioral: The patient is not nervous/anxious.    Objective:   Physical Exam Vitals signs and nursing note reviewed.  Constitutional:      General: She is not in acute distress.    Appearance: She is not toxic-appearing.  HENT:     Head: Normocephalic and atraumatic.  Eyes:     General: No scleral icterus.    Extraocular Movements: Extraocular movements intact.     Conjunctiva/sclera: Conjunctivae normal.     Pupils: Pupils are equal, round, and reactive to light.  Neck:     Musculoskeletal: Pain with movement, spinous process tenderness and muscular tenderness present.     Comments: Pain with palpation along C-spine Cardiovascular:     Rate and Rhythm: Normal rate and regular rhythm.  Pulmonary:     Effort: Pulmonary effort is normal. No respiratory distress.     Breath sounds: Normal breath sounds.  Skin:    General: Skin is warm and dry.     Coloration: Skin is  not pale.  Neurological:     Mental Status: She is alert.  Psychiatric:        Mood and Affect: Mood normal.        Behavior: Behavior normal.    Today's Vitals   09/22/19 1040  BP: 130/70  Pulse: 83  Temp: (!) 96.6 F (35.9 C)  TempSrc: Temporal  SpO2: 98%  Weight: 123 lb (55.8 kg)  Height: 5' 2.5" (1.588 m)  PainSc: 8   PainLoc: Neck   Body mass index is 22.14 kg/m.     Assessment & Plan:    Neck pain-suspect  arthritis is clonazepam.  She will take steroid taper to help reduce inflammation and pain.  We will get x-ray in clinic today.  Advised she can continue to use Tylenol as needed and Aleve on occasion, voices avoiding excess use of Aleve and/or ibuprofen due to concerns of these causing stomach issues/GI bleeding/blood thinning.  Patient given handout outlining gentle range of motion exercises she can do to help neck pain.  Also advised to try topical rubs like BenGay or Biofreeze on neck as well as a heating pad to help reduce neck pain.  Patient will keep all regular follow-ups PCP as planned return to clinic sooner if any issues arise.

## 2019-09-26 ENCOUNTER — Telehealth: Payer: Self-pay

## 2019-09-26 ENCOUNTER — Other Ambulatory Visit: Payer: Self-pay | Admitting: Internal Medicine

## 2019-09-26 MED ORDER — ESCITALOPRAM OXALATE 10 MG PO TABS: 10.0000 mg | ORAL_TABLET | Freq: Every day | ORAL | 5 refills | Status: DC

## 2019-09-26 NOTE — Addendum Note (Signed)
Addended by: Crecencio Mc on: 09/26/2019 11:51 AM   Modules accepted: Orders

## 2019-09-26 NOTE — Telephone Encounter (Signed)
Spoke with pt and informed her that the 10mg  lexapro tablet has been refilled so she only needs to take one tablet at bedtime and then if she wants to go back to the 5mg  she can just cut the pill in half. Pt gave a verbal understanding.

## 2019-09-26 NOTE — Telephone Encounter (Signed)
Yes, I sent it in as 10 mg   It will cost her less.  She can cut in half when she wants to reduce dose

## 2019-09-26 NOTE — Telephone Encounter (Signed)
Received a refill request for Lexapro 10mg  but it stated that the medication had been discontinued so I called the pt. Pt stated that she was taking the 10mg  but it was making her so sleepy, so the prescription was reduced to 5mg . However pt stated that since she has been under a lot of stress the last several months she has started taking 2 5mg  tablets daily at bedtime. Is it okay to refill rx for 2 daily?

## 2019-09-27 ENCOUNTER — Other Ambulatory Visit (INDEPENDENT_AMBULATORY_CARE_PROVIDER_SITE_OTHER): Payer: Medicare Other

## 2019-09-27 ENCOUNTER — Other Ambulatory Visit: Payer: Self-pay

## 2019-09-27 DIAGNOSIS — R944 Abnormal results of kidney function studies: Secondary | ICD-10-CM

## 2019-09-27 LAB — BASIC METABOLIC PANEL
BUN: 22 mg/dL (ref 6–23)
CO2: 31 mEq/L (ref 19–32)
Calcium: 9.5 mg/dL (ref 8.4–10.5)
Chloride: 100 mEq/L (ref 96–112)
Creatinine, Ser: 1.06 mg/dL (ref 0.40–1.20)
GFR: 49.11 mL/min — ABNORMAL LOW (ref >60.00)
Glucose, Bld: 86 mg/dL (ref 70–99)
Potassium: 4.2 mEq/L (ref 3.5–5.1)
Sodium: 138 mEq/L (ref 135–145)

## 2019-09-29 ENCOUNTER — Encounter: Payer: Self-pay | Admitting: Internal Medicine

## 2019-10-26 DIAGNOSIS — Z20828 Contact with and (suspected) exposure to other viral communicable diseases: Secondary | ICD-10-CM | POA: Diagnosis not present

## 2019-10-31 ENCOUNTER — Other Ambulatory Visit: Payer: Self-pay | Admitting: Internal Medicine

## 2019-11-04 ENCOUNTER — Other Ambulatory Visit: Payer: Self-pay | Admitting: Internal Medicine

## 2019-11-21 ENCOUNTER — Other Ambulatory Visit: Payer: Self-pay | Admitting: Internal Medicine

## 2019-12-12 DIAGNOSIS — M170 Bilateral primary osteoarthritis of knee: Secondary | ICD-10-CM | POA: Diagnosis not present

## 2019-12-25 ENCOUNTER — Telehealth: Payer: Self-pay | Admitting: Internal Medicine

## 2019-12-25 NOTE — Telephone Encounter (Signed)
Preoperative medical clearance requires an office visit

## 2019-12-27 NOTE — Telephone Encounter (Signed)
Pt has decided to postpone her appt due to covid. Pt is aware that she needs to call the office to schedule a pre op appt before she can be cleared for surgery.

## 2019-12-27 NOTE — Patient Instructions (Signed)
DUE TO COVID-19 ONLY ONE VISITOR IS ALLOWED TO COME WITH YOU AND STAY IN THE WAITING ROOM ONLY DURING PRE OP AND PROCEDURE DAY OF SURGERY. THE 1 VISITOR MAY VISIT WITH YOU AFTER SURGERY IN YOUR PRIVATE ROOM DURING VISITING HOURS ONLY! YOU NEED TO HAVE A COVID 19 TEST ON__2/5_____ @___10 :05____, THIS TEST MUST BE DONE BEFORE SURGERY, COME  Emily Wu , [id number].  ([place]) ONCE YOUR COVID TEST IS COMPLETED, PLEASE BEGIN THE QUARANTINE INSTRUCTIONS AS OUTLINED IN YOUR HANDOUT.                Emily Wu    Your procedure is scheduled on: 01/03/20   Report to [facility] Main  Entrance   Report to Short Stay at 5:30 AM     Call this number if you have problems the morning of surgery [place], NO CHEWING GUM [place].   Do not eat food After Midnight.   YOU MAY HAVE CLEAR LIQUIDS FROM MIDNIGHT UNTIL 4:30 AM.   At 4:30AM Please finish the prescribed Pre-Surgery  drink.   Nothing by mouth after you finish the  drink !   Take these medicines the morning of surgery with A SIP OF WATER: liothyronine                                 You may not have any metal on your body including hair pins and              piercings  Do not wear jewelry, make-up, lotions, powders or perfumes, deodorant             Do not wear nail polish on your fingernails.  Do not shave  48 hours prior to surgery.           Do not bring valuables to the hospital. [place].  Contacts, dentures or bridgework may not be worn into surgery.       P             Please read over the following fact sheets you were given: _____________________________________________________________________             [facility] - Preparing for Surgery  Before surgery, you can play an important role.   Because skin is not sterile, your skin needs to be as free  of germs as possible.   You can reduce the number of germs on your skin by washing with CHG (chlorahexidine gluconate) soap before surgery.   CHG is an antiseptic cleaner which kills germs and bonds with the skin to continue killing germs even after washing. Please DO NOT use if you have an allergy to CHG or antibacterial soaps.   If your skin becomes reddened/irritated stop using the CHG and inform your nurse when you arrive at Short Stay. Do not shave (including legs and underarms) for at least 48 hours prior to the first CHG shower.    Please follow these instructions carefully:  1.  Shower with CHG Soap the night before surgery and the  morning of Surgery.  2.  If you choose to wash your hair, wash your hair first as usual with your  normal  shampoo.  3.  After you shampoo, rinse your hair and body thoroughly to remove the  shampoo.                                        4.  Use CHG as you would any other liquid soap.  You can apply chg directly  to the skin and wash                       Gently with a scrungie or clean washcloth.  5.  Apply the CHG Soap to your body ONLY FROM THE NECK DOWN.   Do not use on face/ open                           Wound or open sores. Avoid contact with eyes, ears mouth and genitals (private parts).                       Wash face,  Genitals (private parts) with your normal soap.             6.  Wash thoroughly, paying special attention to the area where your surgery  will be performed.  7.  Thoroughly rinse your body with warm water from the neck down.  8.  DO NOT shower/wash with your normal soap after using and rinsing off  the CHG Soap.                9.  Pat yourself dry with a clean towel.            10.  Wear clean pajamas.            11.  Place clean sheets on your bed the night of your first shower and do not  sleep with pets. Day of Surgery : Do not apply any lotions/deodorants the morning of surgery.  Please wear clean clothes to the  hospital/surgery center.  FAILURE TO FOLLOW THESE INSTRUCTIONS MAY RESULT IN THE CANCELLATION OF YOUR SURGERY PATIENT SIGNATURE_________________________________  NURSE SIGNATURE__________________________________  ________________________________________________________________________   Emily Wu  An incentive spirometer is a tool that can help keep your lungs clear and active. This tool measures how well you are filling your lungs with each breath. Taking long deep breaths may help reverse or decrease the chance of developing breathing (pulmonary) problems (especially infection) following:  A long period of time when you are unable to move or be active. BEFORE THE PROCEDURE   If the spirometer includes an indicator to show your best effort, your nurse or respiratory therapist will set it to a desired goal.  If possible, sit up straight or lean slightly forward. Try not to slouch.  Hold the incentive spirometer in an upright position. INSTRUCTIONS FOR USE  1. Sit on the edge of your bed if possible, or sit up as far as you can in bed or on a chair. 2. Hold the incentive spirometer in an upright position. 3. Breathe out normally. 4. Place the mouthpiece in your mouth and seal your lips tightly around it. 5. Breathe in slowly and as deeply as possible, raising the piston or the ball toward the top of the column. 6. Hold your breath for 3-5 seconds or for as long as possible. Allow the piston or ball to fall to the bottom of the column. 7. Remove  the mouthpiece from your mouth and breathe out normally. 8. Rest for a few seconds and repeat Steps 1 through 7 at least 10 times every 1-2 hours when you are awake. Take your time and take a few normal breaths between deep breaths. 9. The spirometer may include an indicator to show your best effort. Use the indicator as a goal to work toward during each repetition. 10. After each set of 10 deep breaths, practice coughing to be sure  your lungs are clear. If you have an incision (the cut made at the time of surgery), support your incision when coughing by placing a pillow or rolled up towels firmly against it. Once you are able to get out of bed, walk around indoors and cough well. You may stop using the incentive spirometer when instructed by your caregiver.  RISKS AND COMPLICATIONS  Take your time so you do not get dizzy or light-headed.  If you are in pain, you may need to take or ask for pain medication before doing incentive spirometry. It is harder to take a deep breath if you are having pain. AFTER USE  Rest and breathe slowly and easily.  It can be helpful to keep track of a log of your progress. Your caregiver can provide you with a simple table to help with this. If you are using the spirometer at home, follow these instructions: [place] IF:   You are having difficultly using the spirometer.  You have trouble using the spirometer as often as instructed.  Your pain medication is not giving enough relief while using the spirometer.  You develop fever of 100.5 F (38.1 C) or higher. SEEK IMMEDIATE MEDICAL CARE IF:   You cough up bloody sputum that had not been present before.  You develop fever of 102 F (38.9 C) or greater.  You develop worsening pain at or near the incision site. MAKE SURE YOU:   Understand these instructions.  Will watch your condition.  Will get help right away if you are not doing well or get worse. Document Released: 03/23/2007 Document Revised: 02/02/2012 Document Reviewed: 05/24/2007 [facility] Patient Information 2014 [place], [place].   ________________________________________________________________________

## 2019-12-28 ENCOUNTER — Encounter (HOSPITAL_COMMUNITY): Admission: RE | Admit: 2019-12-28 | Payer: Medicare Other | Source: Ambulatory Visit

## 2019-12-28 ENCOUNTER — Encounter (HOSPITAL_COMMUNITY)
Admission: RE | Admit: 2019-12-28 | Discharge: 2019-12-28 | Disposition: A | Discharge status: Home / Self Care | Payer: Medicare Other | Source: Ambulatory Visit | Attending: Orthopedic Surgery | Admitting: Orthopedic Surgery

## 2019-12-28 NOTE — Progress Notes (Signed)
I called Pt for her PAT visit. She reports that she cancelled her surgery because she cares for her husband at home and "Now is not a good time". She hopes to have her surgery when thing improve with her husband.

## 2019-12-30 ENCOUNTER — Other Ambulatory Visit (HOSPITAL_COMMUNITY): Payer: Medicare Other

## 2020-01-03 ENCOUNTER — Ambulatory Visit: Admit: 2020-01-03 | Payer: Medicare Other | Admitting: Orthopedic Surgery

## 2020-01-03 ENCOUNTER — Other Ambulatory Visit: Payer: Self-pay | Admitting: Internal Medicine

## 2020-01-03 SURGERY — ARTHROPLASTY, KNEE, UNICOMPARTMENTAL
Anesthesia: Choice | Site: Knee | Laterality: Right

## 2020-02-15 ENCOUNTER — Other Ambulatory Visit: Payer: Self-pay | Admitting: Internal Medicine

## 2020-02-29 ENCOUNTER — Other Ambulatory Visit: Payer: Self-pay | Admitting: Internal Medicine

## 2020-03-02 ENCOUNTER — Emergency Department
Admission: EM | Admit: 2020-03-02 | Discharge: 2020-03-03 | Disposition: A | Discharge status: Home / Self Care | Payer: Medicare Other | Attending: Emergency Medicine | Admitting: Emergency Medicine

## 2020-03-02 ENCOUNTER — Encounter: Payer: Self-pay | Admitting: *Deleted

## 2020-03-02 ENCOUNTER — Other Ambulatory Visit: Payer: Self-pay

## 2020-03-02 ENCOUNTER — Emergency Department: Payer: Medicare Other

## 2020-03-02 DIAGNOSIS — Z79899 Other long term (current) drug therapy: Secondary | ICD-10-CM | POA: Diagnosis not present

## 2020-03-02 DIAGNOSIS — Z8541 Personal history of malignant neoplasm of cervix uteri: Secondary | ICD-10-CM | POA: Insufficient documentation

## 2020-03-02 DIAGNOSIS — Y999 Unspecified external cause status: Secondary | ICD-10-CM | POA: Diagnosis not present

## 2020-03-02 DIAGNOSIS — I129 Hypertensive chronic kidney disease with stage 1 through stage 4 chronic kidney disease, or unspecified chronic kidney disease: Secondary | ICD-10-CM | POA: Insufficient documentation

## 2020-03-02 DIAGNOSIS — Z96652 Presence of left artificial knee joint: Secondary | ICD-10-CM | POA: Insufficient documentation

## 2020-03-02 DIAGNOSIS — W19XXXA Unspecified fall, initial encounter: Secondary | ICD-10-CM

## 2020-03-02 DIAGNOSIS — S00211A Abrasion of right eyelid and periocular area, initial encounter: Secondary | ICD-10-CM | POA: Diagnosis not present

## 2020-03-02 DIAGNOSIS — S0031XA Abrasion of nose, initial encounter: Secondary | ICD-10-CM | POA: Insufficient documentation

## 2020-03-02 DIAGNOSIS — Y92019 Unspecified place in single-family (private) house as the place of occurrence of the external cause: Secondary | ICD-10-CM | POA: Insufficient documentation

## 2020-03-02 DIAGNOSIS — N183 Chronic kidney disease, stage 3 unspecified: Secondary | ICD-10-CM | POA: Diagnosis not present

## 2020-03-02 DIAGNOSIS — S0990XA Unspecified injury of head, initial encounter: Secondary | ICD-10-CM | POA: Diagnosis not present

## 2020-03-02 DIAGNOSIS — R52 Pain, unspecified: Secondary | ICD-10-CM | POA: Diagnosis not present

## 2020-03-02 DIAGNOSIS — Y939 Activity, unspecified: Secondary | ICD-10-CM | POA: Diagnosis not present

## 2020-03-02 DIAGNOSIS — W01198A Fall on same level from slipping, tripping and stumbling with subsequent striking against other object, initial encounter: Secondary | ICD-10-CM | POA: Diagnosis not present

## 2020-03-02 DIAGNOSIS — E039 Hypothyroidism, unspecified: Secondary | ICD-10-CM | POA: Insufficient documentation

## 2020-03-02 DIAGNOSIS — S0083XA Contusion of other part of head, initial encounter: Secondary | ICD-10-CM | POA: Diagnosis not present

## 2020-03-02 DIAGNOSIS — Z87891 Personal history of nicotine dependence: Secondary | ICD-10-CM | POA: Insufficient documentation

## 2020-03-02 DIAGNOSIS — T07XXXA Unspecified multiple injuries, initial encounter: Secondary | ICD-10-CM

## 2020-03-02 DIAGNOSIS — S0993XA Unspecified injury of face, initial encounter: Secondary | ICD-10-CM | POA: Diagnosis not present

## 2020-03-02 DIAGNOSIS — Z9049 Acquired absence of other specified parts of digestive tract: Secondary | ICD-10-CM | POA: Diagnosis not present

## 2020-03-02 DIAGNOSIS — S199XXA Unspecified injury of neck, initial encounter: Secondary | ICD-10-CM | POA: Diagnosis not present

## 2020-03-02 NOTE — ED Triage Notes (Signed)
Pt says that she was raking in the gravel and tripped and fell. She denies LOC. She denies blood thinners, n/v or dizziness. Pupils equal and reactive. She has an abrasion and swelling over the right eye, swelling/abrasion to the bridge of the nose and pain in the left jaw area (no obvious trauma to the jaw).

## 2020-03-02 NOTE — ED Triage Notes (Signed)
Please call patient daughter, [name] at [phone] with updates.

## 2020-03-02 NOTE — ED Notes (Signed)
Patient to waiting room via wheelchair by EMS from home for a fall.  Patien with hematoma above right eye, laceration on his nose and complains of left jaw pain.  EMS vitals:  Hr 78, bp 148/60, pulse oxi on room air 98%>

## 2020-03-03 ENCOUNTER — Other Ambulatory Visit: Payer: Self-pay | Admitting: Internal Medicine

## 2020-03-03 MED ORDER — ACETAMINOPHEN 500 MG PO TABS
1000.0000 mg | ORAL_TABLET | Freq: Once | ORAL | Status: AC
  Administered 2020-03-03: 1000 mg via ORAL
  Filled 2020-03-03: qty 2

## 2020-03-03 NOTE — Discharge Instructions (Addendum)
As we discussed, fortunately your CT scans did not reveal any broken bones.  You can expect to be quite sore over the next few days.  I believe because of the way you fell, it caused some strain on the left side of your jaw, and I encourage you to stick with a soft food diet until your jaw starts feeling better.  Even if you do not feel like eating very much, it is important that you stay hydrated.  Continue to take your regular medications including over-the-counter ibuprofen and/or Tylenol according to label instructions.  If you have prescriptions at home for Norco, meloxicam, or other similar pain medication, you may also use these.  Keep the abrasions on the bridge of your nose that resulted from your eyeglasses clean and dry and you can use a small amount of bacitracin or other topical antibiotic ointment.    Return to the emergency department if you develop new or worsening symptoms that concern you.

## 2020-03-03 NOTE — ED Notes (Signed)
Called daughter [name] [phone] regarding ride home

## 2020-03-03 NOTE — ED Provider Notes (Signed)
[facility] Emergency Department Provider Note  ____________________________________________   First MD Initiated Contact with Patient 03/02/20 2351     (approximate)  I have reviewed the triage vital signs and the nursing notes.   HISTORY  Chief Complaint Fall    HPI NDIA SANKS is a 86 y.o. female with medical history as listed below which does not include the use of blood thinners.  She presents  for evaluation of facial pain and bruising after having a mechanical fall.  She says that she tripped in the gravel and landed on her face without having enough time to catch herself.  She has no pain or injury to her arms or legs.  She did not lose consciousness and reports no neck pain.  She has pain around her eyes and on the bridge of her nose with some swelling and bruising near her right eye and some pain in the left upper jaw area.  She is able to speak and open her mouth without difficulty.  She has no difficulty swallowing and has no pain in any part of her neck.  She denies chest pain, shortness of breath, nausea, vomiting, and abdominal pain.  The onset was acute and the incident was severe, the pain is mild at rest but most of the pain she is experiencing is in the left upper part of her jaw.        Past Medical History:  Diagnosis Date  . Arthritis    "knees, shoulders, chest" (02/03/2014)  . Bruises easily   . Cervical cancer ([place]) 1965  . High cholesterol   . History of kidney stones   . Hypertension    DENIES   . Tendency toward bleeding easily [facility])     Patient Active Problem List   Diagnosis Date Noted  . Osteoarthritis of right knee 08/26/2019  . Orthostatic dizziness 08/18/2019  . S/P left unicompartmental knee replacement 01/26/2019  . Hospital discharge follow-up 12/26/2018  . Incidental pulmonary nodule, > 81mm and < 5mm 12/26/2018  . Chronic kidney disease (CKD), stage III (moderate) 12/26/2018  . Left bundle branch block  (LBBB) determined by electrocardiography 12/02/2018  . GERD (gastroesophageal reflux disease) 09/29/2018  . Anxiety disorder 03/25/2018  . Chronic pain of left knee 05/27/2016  . Left carotid bruit 04/14/2016  . Menopausal hot flushes 08/07/2015  . H/O malignant neoplasm of skin 06/25/2015  . Hypothyroidism 01/31/2015  . History of cervical cancer 01/10/2015  . Hyperlipidemia 02/03/2014  . Medicare annual wellness visit, subsequent 01/19/2014  . History of IBS 01/19/2014  . S/P breast augmentation 01/19/2014  . Primary localized osteoarthritis of left knee 07/06/2012  . Colon polyps 01/30/2012  . Low back pain potentially associated with radiculopathy 01/30/2012    Past Surgical History:  Procedure Laterality Date  . ABDOMINAL HYSTERECTOMY  1965  . APPENDECTOMY    . CHOLECYSTECTOMY    . KNEE ARTHROSCOPY Left    [place]  . PARTIAL KNEE ARTHROPLASTY Left 01/26/2019   Procedure: UNICOMPARTMENTAL KNEE;  Surgeon: Marchia Bond, MD;  Location: [place] [facility];  Service: Orthopedics;  Laterality: Left;  . SHOULDER OPEN ROTATOR CUFF REPAIR Bilateral 2007,  2010   [place]  . THROAT SURGERY  2014   "arthritis [place] knot" (02/03/2014)    Prior to Admission medications   Medication Sig Start Date End Date Taking? Authorizing Provider  acetaminophen (TYLENOL) 650 MG CR tablet Take 650 mg by mouth every 8 (eight) hours as needed for pain.  [provider]  amitriptyline (ELAVIL) 25 MG tablet TAKE ONE TABLET AT BEDTIME NEED APP FOR NEXT REFILL 01/03/20   Crecencio Mc, MD  atorvastatin (LIPITOR) 20 MG tablet TAKE ONE TABLET EVERY DAY 02/29/20   Crecencio Mc, MD  diclofenac sodium (VOLTAREN) 1 % GEL Apply 2 g topically 4 (four) times daily as needed (for knee pain). Patient not taking: Reported on 12/23/2019 12/24/18   Crecencio Mc, MD  DOTTI 0.1 MG/24HR patch CHANGE PATCH TWICE WEEKLY 01/03/20   Crecencio Mc, MD  escitalopram (LEXAPRO) 10 MG tablet TAKE 1 TABLET AT BEDTIME 02/16/20   Crecencio Mc, MD  HYDROcodone-acetaminophen (NORCO) 5-325 MG tablet Take 1-2 tablets by mouth every 6 (six) hours as needed for moderate pain. MAXIMUM TOTAL ACETAMINOPHEN DOSE IS 4000 MG PER DAY Patient not taking: Reported on 12/23/2019 01/26/19   Marchia Bond, MD  liothyronine (CYTOMEL) 5 MCG tablet TAKE 2 TABLETS BY MOUTH DAILY Patient taking differently: Take 10 mcg by mouth daily before breakfast.  11/07/19   Crecencio Mc, MD  meloxicam (MOBIC) 7.5 MG tablet TAKE 1 TABLET BY MOUTH DAILY AS NEEDED FOR JOINT PAIN Patient not taking: Reported on 12/23/2019 04/21/19   Crecencio Mc, MD  methylPREDNISolone (MEDROL DOSEPAK) 4 MG TBPK tablet Take according to pack instructions Patient not taking: Reported on 12/23/2019 09/22/19   Jodelle Green, FNP  Multiple Vitamins-Minerals (CENTRUM SILVER PO) Take 1 tablet by mouth daily.    [provider]  ondansetron (ZOFRAN) 4 MG tablet Take 1 tablet (4 mg total) by mouth every 8 (eight) hours as needed for nausea or vomiting. 01/26/19   Marchia Bond, MD  polyethylene glycol [facility] Floria Raveling) packet Take 17 g by mouth daily as needed for moderate constipation.     [provider]  protein supplement (RESOURCE BENEPROTEIN) POWD Take 1 scoop by mouth daily as needed (constipation).     [provider]  sennosides-docusate sodium (SENOKOT-S) 8.6-50 MG tablet Take 2 tablets by mouth daily. Patient not taking: Reported on 12/23/2019 01/26/19   Marchia Bond, MD  vitamin B-12 (CYANOCOBALAMIN) 1000 MCG tablet Take 1,000 mcg by mouth daily.     [provider]    Allergies Tramadol, Amoxicillin, Promethazine hcl, and Sulfa drugs cross reactors  Family History  Problem Relation Age of Onset  . Peripheral vascular disease Mother   . Stroke Father   . Hyperlipidemia Father   . Heart disease Father 32  . Cancer Neg Hx     Social History Social History   Tobacco Use  . Smoking status: Former Smoker    Packs/day: 2.00     Years: 7.00    Pack years: 14.00    Types: Cigarettes  . Smokeless tobacco: Never Used  . Tobacco comment: 02/03/2014 "quit smoking in the 1960's"  Substance Use Topics  . Alcohol use: No  . Drug use: No    Review of Systems Constitutional: No fever/chills Eyes: No visual changes. ENT: No sore throat. Cardiovascular: Denies chest pain. Respiratory: Denies shortness of breath. Gastrointestinal: No abdominal pain.  No nausea, no vomiting.  No diarrhea.  No constipation. Genitourinary: Negative for dysuria. Musculoskeletal: Facial pain and contusions including left sided jaw pain. Integumentary: Facial contusions and abrasions. Neurological: Negative for headaches, focal weakness or numbness.   ____________________________________________   PHYSICAL EXAM:  VITAL SIGNS: ED Triage Vitals [03/02/20 1924]  Enc Vitals Group     BP (!) 128/51     Pulse Rate  63     Resp 16     Temp 98.2 F (36.8 C)     Temp Source Oral     SpO2 97 %     Weight      Height      Head Circumference      Peak Flow      Pain Score 5     Pain Loc      Pain Edu?      Excl. in [place]?     Constitutional: Alert and oriented.  Eyes: Conjunctivae are normal.  No subconjunctival hemorrhages.  Pupils are equal and reactive.  No exophthalmos, no chemosis. Head: Ecchymosis and superficial abrasions to the bridge of her nose bilaterally consistent with eyeglasses nose pieces.  She also has a contusion with ecchymosis to the lateral side of her right eye.  She has tenderness to palpation of the left upper mandible but without any obvious deformity. Nose: No congestion/rhinnorhea. Mouth/Throat: Patient is wearing a mask.  No trismus. Neck: No stridor.  No meningeal signs.  No tenderness to palpation of the cervical spine, no pain or tenderness with flexion and extension as well as rotation of her head and neck side to side. Cardiovascular: Normal rate, regular rhythm. Good peripheral circulation. Grossly normal  heart sounds. Respiratory: Normal respiratory effort.  No retractions. Gastrointestinal: Soft and nontender. No distention.  Musculoskeletal: No gross deformities of her extremities.  She has no visible injuries to her arms or legs and has no reproducible pain or tenderness with flexion extension of all her major joints. Neurologic:  Normal speech and language. No gross focal neurologic deficits are appreciated.  Skin:  Skin is warm, dry and intact. Psychiatric: Mood and affect are normal. Speech and behavior are normal.  ____________________________________________   LABS (all labs ordered are listed, but only abnormal results are displayed)  Labs Reviewed - No data to display ____________________________________________  EKG  No indication for emergent EKG. ____________________________________________  RADIOLOGY Ursula Alert, personally viewed and evaluated these images (plain radiographs) as part of my medical decision making, as well as reviewing the written report by the radiologist.  ED MD interpretation: No acute abnormalities identified by radiology on CTs of head, cervical spine, nor maxillofacial.    Official radiology report(s): CT Head Wo Contrast  Result Date: 03/02/2020 CLINICAL DATA:  86 year old female with facial trauma. EXAM: CT HEAD WITHOUT CONTRAST CT MAXILLOFACIAL WITHOUT CONTRAST CT CERVICAL SPINE WITHOUT CONTRAST TECHNIQUE: Multidetector CT imaging of the head, cervical spine, and maxillofacial structures were performed using the standard protocol without intravenous contrast. Multiplanar CT image reconstructions of the cervical spine and maxillofacial structures were also generated. COMPARISON:  Head CT dated 05/20/2006. FINDINGS: CT HEAD FINDINGS Brain: There is mild age-related atrophy and chronic microvascular ischemic changes. There is no acute intracranial hemorrhage. No mass effect or midline shift. No extra-axial fluid collection. Vascular: No hyperdense  vessel or unexpected calcification. Skull: Normal. Negative for fracture or focal lesion. Other: None CT MAXILLOFACIAL FINDINGS Osseous: No acute fracture. No mandibular subluxation. Orbits: The globes and retro-orbital fat are preserved. Sinuses: Clear. Soft tissues: Negative. CT CERVICAL SPINE FINDINGS Alignment: No acute subluxation. There is reversal of normal cervical lordosis which may be positional or due to muscle spasm or secondary to degenerative changes. Skull base and vertebrae: No acute fracture. Soft tissues and spinal canal: No prevertebral fluid or swelling. No visible canal hematoma. Disc levels: Multilevel degenerative changes. Grade 1 C4-C5 anterolisthesis. Upper chest: Negative. Other: None IMPRESSION: 1. No  acute intracranial pathology. Mild age-related atrophy and chronic microvascular ischemic changes. 2. No acute/traumatic cervical spine pathology. 3. No acute facial bone fractures. Electronically Signed   By: Anner Crete M.D.   On: 03/02/2020 20:13   CT Cervical Spine Wo Contrast  Result Date: 03/02/2020 CLINICAL DATA:  86 year old female with facial trauma. EXAM: CT HEAD WITHOUT CONTRAST CT MAXILLOFACIAL WITHOUT CONTRAST CT CERVICAL SPINE WITHOUT CONTRAST TECHNIQUE: Multidetector CT imaging of the head, cervical spine, and maxillofacial structures were performed using the standard protocol without intravenous contrast. Multiplanar CT image reconstructions of the cervical spine and maxillofacial structures were also generated. COMPARISON:  Head CT dated 05/20/2006. FINDINGS: CT HEAD FINDINGS Brain: There is mild age-related atrophy and chronic microvascular ischemic changes. There is no acute intracranial hemorrhage. No mass effect or midline shift. No extra-axial fluid collection. Vascular: No hyperdense vessel or unexpected calcification. Skull: Normal. Negative for fracture or focal lesion. Other: None CT MAXILLOFACIAL FINDINGS Osseous: No acute fracture. No mandibular  subluxation. Orbits: The globes and retro-orbital fat are preserved. Sinuses: Clear. Soft tissues: Negative. CT CERVICAL SPINE FINDINGS Alignment: No acute subluxation. There is reversal of normal cervical lordosis which may be positional or due to muscle spasm or secondary to degenerative changes. Skull base and vertebrae: No acute fracture. Soft tissues and spinal canal: No prevertebral fluid or swelling. No visible canal hematoma. Disc levels: Multilevel degenerative changes. Grade 1 C4-C5 anterolisthesis. Upper chest: Negative. Other: None IMPRESSION: 1. No acute intracranial pathology. Mild age-related atrophy and chronic microvascular ischemic changes. 2. No acute/traumatic cervical spine pathology. 3. No acute facial bone fractures. Electronically Signed   By: Anner Crete M.D.   On: 03/02/2020 20:13   CT Maxillofacial Wo Contrast  Result Date: 03/02/2020 CLINICAL DATA:  86 year old female with facial trauma. EXAM: CT HEAD WITHOUT CONTRAST CT MAXILLOFACIAL WITHOUT CONTRAST CT CERVICAL SPINE WITHOUT CONTRAST TECHNIQUE: Multidetector CT imaging of the head, cervical spine, and maxillofacial structures were performed using the standard protocol without intravenous contrast. Multiplanar CT image reconstructions of the cervical spine and maxillofacial structures were also generated. COMPARISON:  Head CT dated 05/20/2006. FINDINGS: CT HEAD FINDINGS Brain: There is mild age-related atrophy and chronic microvascular ischemic changes. There is no acute intracranial hemorrhage. No mass effect or midline shift. No extra-axial fluid collection. Vascular: No hyperdense vessel or unexpected calcification. Skull: Normal. Negative for fracture or focal lesion. Other: None CT MAXILLOFACIAL FINDINGS Osseous: No acute fracture. No mandibular subluxation. Orbits: The globes and retro-orbital fat are preserved. Sinuses: Clear. Soft tissues: Negative. CT CERVICAL SPINE FINDINGS Alignment: No acute subluxation. There is  reversal of normal cervical lordosis which may be positional or due to muscle spasm or secondary to degenerative changes. Skull base and vertebrae: No acute fracture. Soft tissues and spinal canal: No prevertebral fluid or swelling. No visible canal hematoma. Disc levels: Multilevel degenerative changes. Grade 1 C4-C5 anterolisthesis. Upper chest: Negative. Other: None IMPRESSION: 1. No acute intracranial pathology. Mild age-related atrophy and chronic microvascular ischemic changes. 2. No acute/traumatic cervical spine pathology. 3. No acute facial bone fractures. Electronically Signed   By: Anner Crete M.D.   On: 03/02/2020 20:13    ____________________________________________   PROCEDURES   Procedure(s) performed (including Critical Care):  Procedures   ____________________________________________   INITIAL IMPRESSION / MDM / [place] / ED COURSE  As part of my medical decision making, I reviewed the following data within the [place] notes reviewed and incorporated, Old chart reviewed and Notes from prior ED  visits   Differential diagnosis includes, but is not limited to, acute intracranial bleeding, facial fracture, mandibular fracture.  The patient is able to recount the episode that led to her fall and it sounds very consistent with a mechanical fall on gravel.  She has no extremity injuries.  She has facial contusions and ecchymosis but no battle sign and she does not have "raccoon eyes", she has some contusions near the right eye and bilateral nasal bridge injuries due to wearing eyeglasses when she fell and struck her face on the ground.  No evidence of globe injury.  Although much of her pain is in the left upper part of her mandible there is no evidence of fracture on CT maxillofacial and I suspect this is due to the contusion.  We talked about pain management, sticking with a soft food diet, and checking in with her primary care provider  at the next available opportunity at the beginning of this coming week.  She understands and agrees.  She has a daughter that is going to be able to come pick her up and help care for her over the weekend.  I was going to give her a dose of tramadol but she has an anaphylactic reaction listed to tramadol so I will give her Tylenol 1000 mg by mouth before she goes home and I encourage the use of ibuprofen and Tylenol according to label directions when she goes home.       ____________________________________________  FINAL CLINICAL IMPRESSION(S) / ED DIAGNOSES  Final diagnoses:  Fall, initial encounter  Facial contusion, initial encounter  Multiple abrasions     MEDICATIONS GIVEN DURING THIS VISIT:  Medications  acetaminophen (TYLENOL) tablet 1,000 mg (1,000 mg Oral Given 03/03/20 0016)     ED Discharge Orders    None      *Please note:  GWENDELYN VILES was evaluated in Emergency Department on 03/03/2020 for the symptoms described in the history of present illness. She was evaluated in the context of the global COVID-19 pandemic, which necessitated consideration that the patient might be at risk for infection with the SARS-CoV-2 virus that causes COVID-19. Institutional protocols and algorithms that pertain to the evaluation of patients at risk for COVID-19 are in a state of rapid change based on information released by regulatory bodies including the CDC and federal and state organizations. These policies and algorithms were followed during the patient's care in the ED.  Some ED evaluations and interventions may be delayed as a result of limited staffing during the pandemic.*  Note:  This document was prepared using Dragon voice recognition software and may include unintentional dictation errors.   Hinda Kehr, MD 03/03/20 [phone]

## 2020-03-09 ENCOUNTER — Encounter: Payer: Self-pay | Admitting: Internal Medicine

## 2020-03-09 ENCOUNTER — Other Ambulatory Visit: Payer: Self-pay

## 2020-03-09 ENCOUNTER — Ambulatory Visit (INDEPENDENT_AMBULATORY_CARE_PROVIDER_SITE_OTHER): Payer: Medicare Other | Admitting: Internal Medicine

## 2020-03-09 VITALS — BP 112/64 | HR 77 | Temp 97.8°F | Resp 15 | Ht 62.5 in | Wt 123.0 lb

## 2020-03-09 DIAGNOSIS — E034 Atrophy of thyroid (acquired): Secondary | ICD-10-CM | POA: Diagnosis not present

## 2020-03-09 DIAGNOSIS — S060X0A Concussion without loss of consciousness, initial encounter: Secondary | ICD-10-CM | POA: Diagnosis not present

## 2020-03-09 DIAGNOSIS — Z634 Disappearance and death of family member: Secondary | ICD-10-CM | POA: Diagnosis not present

## 2020-03-09 DIAGNOSIS — E559 Vitamin D deficiency, unspecified: Secondary | ICD-10-CM

## 2020-03-09 DIAGNOSIS — E538 Deficiency of other specified B group vitamins: Secondary | ICD-10-CM

## 2020-03-09 DIAGNOSIS — N1831 Chronic kidney disease, stage 3a: Secondary | ICD-10-CM | POA: Diagnosis not present

## 2020-03-09 LAB — CBC WITH DIFFERENTIAL/PLATELET
Basophils Absolute: 0.1 10*3/uL (ref 0.0–0.1)
Basophils Relative: 0.8 % (ref 0.0–3.0)
Eosinophils Absolute: 0.1 10*3/uL (ref 0.0–0.7)
Eosinophils Relative: 1.1 % (ref 0.0–5.0)
HCT: 36.5 % (ref 36.0–46.0)
Hemoglobin: 12.2 g/dL (ref 12.0–15.0)
Lymphocytes Relative: 30.6 % (ref 12.0–46.0)
Lymphs Abs: 2.1 10*3/uL (ref 0.7–4.0)
MCHC: 33.3 g/dL (ref 30.0–36.0)
MCV: 92.1 fl (ref 78.0–100.0)
Monocytes Absolute: 0.6 10*3/uL (ref 0.1–1.0)
Monocytes Relative: 9.6 % (ref 3.0–12.0)
Neutro Abs: 3.9 10*3/uL (ref 1.4–7.7)
Neutrophils Relative %: 57.9 % (ref 43.0–77.0)
Platelets: 354 10*3/uL (ref 150.0–400.0)
RBC: 3.96 Mil/uL (ref 3.87–5.11)
RDW: 13.9 % (ref 11.5–15.5)
WBC: 6.8 10*3/uL (ref 4.0–10.5)

## 2020-03-09 LAB — COMPREHENSIVE METABOLIC PANEL
ALT: 16 U/L (ref 0–35)
AST: 22 U/L (ref 0–37)
Albumin: 3.7 g/dL (ref 3.5–5.2)
Alkaline Phosphatase: 68 U/L (ref 39–117)
BUN: 11 mg/dL (ref 6–23)
CO2: 29 mEq/L (ref 19–32)
Calcium: 9.1 mg/dL (ref 8.4–10.5)
Chloride: 104 mEq/L (ref 96–112)
Creatinine, Ser: 0.8 mg/dL (ref 0.40–1.20)
GFR: 67.88 mL/min (ref >60.00)
Glucose, Bld: 96 mg/dL (ref 70–99)
Potassium: 3.4 mEq/L — ABNORMAL LOW (ref 3.5–5.1)
Sodium: 138 mEq/L (ref 135–145)
Total Bilirubin: 0.3 mg/dL (ref 0.2–1.2)
Total Protein: 6.4 g/dL (ref 6.0–8.3)

## 2020-03-09 LAB — VITAMIN D 25 HYDROXY (VIT D DEFICIENCY, FRACTURES): VITD: 52.19 ng/mL (ref 30.00–100.00)

## 2020-03-09 LAB — TSH: TSH: 1.95 u[IU]/mL (ref 0.35–4.50)

## 2020-03-09 LAB — VITAMIN B12: Vitamin B-12: 1109 pg/mL — ABNORMAL HIGH (ref 211–911)

## 2020-03-09 MED ORDER — ESCITALOPRAM OXALATE 10 MG PO TABS: 10.0000 mg | ORAL_TABLET | Freq: Every day | ORAL | 5 refills | Status: DC

## 2020-03-09 NOTE — Progress Notes (Signed)
.   Subjective:  Patient ID: Emily Wu, female    DOB: [birth year]  Age: 86 y.o. MRN: [id number]  CC: The primary encounter diagnosis was Hypothyroidism due to acquired atrophy of thyroid. Diagnoses of Vitamin D deficiency, Stage 3a chronic kidney disease, B12 deficiency, Concussion without loss of consciousness, initial encounter, and Widowed were also pertinent to this visit.  HPI MAISA SORBELLO presents for ER follow up after a fall at home on April 9  Patient tripped over a garden hose and "face planted" in gravel.  ED physician evaluated with CT head and maxillo facial, no fractures seen.  She continues to have tenderness of the right cheek bone  But denies headaches, vision changes,  And tinnitus.      Her husband Barnabas Lister  died at home with  Hospice on 03-08-23 after developing a bowel PERFORATION   Right knee pain getting worse,  Surgery postponed.  Left knee done last year. With good results    Outpatient Medications Prior to Visit  Medication Sig Dispense Refill  . acetaminophen (TYLENOL) 650 MG CR tablet Take 650 mg by mouth every 8 (eight) hours as needed for pain.     Marland Kitchen amitriptyline (ELAVIL) 25 MG tablet TAKE ONE TABLET AT BEDTIME NEED APP FOR NEXT REFILL 90 tablet 0  . atorvastatin (LIPITOR) 20 MG tablet TAKE ONE TABLET EVERY DAY 90 tablet 1  . DOTTI 0.1 MG/24HR patch CHANGE PATCH TWICE WEEKLY 8 patch 1  . liothyronine (CYTOMEL) 5 MCG tablet TAKE 2 TABLETS BY MOUTH DAILY (Patient taking differently: Take 10 mcg by mouth daily before breakfast. ) 180 tablet 0  . methylPREDNISolone (MEDROL DOSEPAK) 4 MG TBPK tablet Take according to pack instructions 21 tablet 0  . Multiple Vitamins-Minerals (CENTRUM SILVER PO) Take 1 tablet by mouth daily.    . ondansetron (ZOFRAN) 4 MG tablet Take 1 tablet (4 mg total) by mouth every 8 (eight) hours as needed for nausea or vomiting. 10 tablet 0  . polyethylene glycol (MIRALAX / GLYCOLAX) packet Take 17 g by mouth daily as needed for moderate  constipation.     . protein supplement (RESOURCE BENEPROTEIN) POWD Take 1 scoop by mouth daily as needed (constipation).     . vitamin B-12 (CYANOCOBALAMIN) 1000 MCG tablet Take 1,000 mcg by mouth daily.     Marland Kitchen escitalopram (LEXAPRO) 10 MG tablet TAKE 1 TABLET AT BEDTIME 30 tablet 5  . diclofenac sodium (VOLTAREN) 1 % GEL Apply 2 g topically 4 (four) times daily as needed (for knee pain). (Patient not taking: Reported on 12/23/2019) 200 g 11  . HYDROcodone-acetaminophen (NORCO) 5-325 MG tablet Take 1-2 tablets by mouth every 6 (six) hours as needed for moderate pain. MAXIMUM TOTAL ACETAMINOPHEN DOSE IS 4000 MG PER DAY (Patient not taking: Reported on 12/23/2019) 30 tablet 0  . meloxicam (MOBIC) 7.5 MG tablet TAKE 1 TABLET BY MOUTH DAILY AS NEEDED FOR JOINT PAIN (Patient not taking: Reported on 12/23/2019) 90 tablet 1  . sennosides-docusate sodium (SENOKOT-S) 8.6-50 MG tablet Take 2 tablets by mouth daily. (Patient not taking: Reported on 12/23/2019) 30 tablet 1   No facility-administered medications prior to visit.    Review of Systems;  Patient denies headache, fevers, malaise, unintentional weight loss, skin rash, eye pain, sinus congestion and sinus pain, sore throat, dysphagia,  hemoptysis , cough, dyspnea, wheezing, chest pain, palpitations, orthopnea, edema, abdominal pain, nausea, melena, diarrhea, constipation, flank pain, dysuria, hematuria, urinary  Frequency, nocturia, numbness, tingling, seizures,  Focal weakness,  Loss of consciousness,  Tremor, insomnia, depression, anxiety, and suicidal ideation.      Objective:  BP 112/64 (BP Location: Left Arm, Patient Position: Sitting, Cuff Size: Normal)   Pulse 77   Temp 97.8 F (36.6 C) (Temporal)   Resp 15   Ht 5' 2.5" (1.588 m)   Wt 123 lb (55.8 kg)   SpO2 99%   BMI 22.14 kg/m   BP Readings from Last 3 Encounters:  03/09/20 112/64  03/03/20 (!) 102/45  09/22/19 130/70    Wt Readings from Last 3 Encounters:  03/09/20 123 lb (55.8  kg)  09/22/19 123 lb (55.8 kg)  08/25/19 121 lb 6.4 oz (55.1 kg)    General appearance: alert, cooperative and appears stated age Ears: normal TM's and external ear canals both ears Throat: lips, mucosa, and tongue normal; teeth and gums normal Neck: no adenopathy, no carotid bruit, supple, symmetrical, trachea midline and thyroid not enlarged, symmetric, no tenderness/mass/nodules Back: symmetric, no curvature. ROM normal. No CVA tenderness. Lungs: clear to auscultation bilaterally Heart: regular rate and rhythm, S1, S2 normal, no murmur, click, rub or gallop Abdomen: soft, non-tender; bowel sounds normal; no masses,  no organomegaly Pulses: 2+ and symmetric Skin: Skin color, texture, turgor normal. No rashes or lesions Lymph nodes: Cervical, supraclavicular, and axillary nodes normal.  Lab Results  Component Value Date   HGBA1C 5.8 08/18/2019    Lab Results  Component Value Date   CREATININE 0.80 03/09/2020   CREATININE 1.06 09/27/2019   CREATININE 1.05 08/25/2019    Lab Results  Component Value Date   WBC 6.8 03/09/2020   HGB 12.2 03/09/2020   HCT 36.5 03/09/2020   PLT 354.0 03/09/2020   GLUCOSE 96 03/09/2020   CHOL 161 08/18/2019   TRIG 206.0 (H) 08/18/2019   HDL 41.00 08/18/2019   LDLDIRECT 90.0 08/18/2019   LDLCALC 73 03/19/2018   ALT 16 03/09/2020   AST 22 03/09/2020   NA 138 03/09/2020   K 3.4 (L) 03/09/2020   CL 104 03/09/2020   CREATININE 0.80 03/09/2020   BUN 11 03/09/2020   CO2 29 03/09/2020   TSH 1.95 03/09/2020   INR 1.04 12/08/2018   HGBA1C 5.8 08/18/2019    CT Head Wo Contrast  Result Date: 03/02/2020 CLINICAL DATA:  86 year old female with facial trauma. EXAM: CT HEAD WITHOUT CONTRAST CT MAXILLOFACIAL WITHOUT CONTRAST CT CERVICAL SPINE WITHOUT CONTRAST TECHNIQUE: Multidetector CT imaging of the head, cervical spine, and maxillofacial structures were performed using the standard protocol without intravenous contrast. Multiplanar CT image  reconstructions of the cervical spine and maxillofacial structures were also generated. COMPARISON:  Head CT dated 05/20/2006. FINDINGS: CT HEAD FINDINGS Brain: There is mild age-related atrophy and chronic microvascular ischemic changes. There is no acute intracranial hemorrhage. No mass effect or midline shift. No extra-axial fluid collection. Vascular: No hyperdense vessel or unexpected calcification. Skull: Normal. Negative for fracture or focal lesion. Other: None CT MAXILLOFACIAL FINDINGS Osseous: No acute fracture. No mandibular subluxation. Orbits: The globes and retro-orbital fat are preserved. Sinuses: Clear. Soft tissues: Negative. CT CERVICAL SPINE FINDINGS Alignment: No acute subluxation. There is reversal of normal cervical lordosis which may be positional or due to muscle spasm or secondary to degenerative changes. Skull base and vertebrae: No acute fracture. Soft tissues and spinal canal: No prevertebral fluid or swelling. No visible canal hematoma. Disc levels: Multilevel degenerative changes. Grade 1 C4-C5 anterolisthesis. Upper chest: Negative. Other: None IMPRESSION: 1. No acute intracranial pathology. Mild age-related atrophy and chronic microvascular ischemic  changes. 2. No acute/traumatic cervical spine pathology. 3. No acute facial bone fractures. Electronically Signed   By: Anner Crete M.D.   On: 03/02/2020 20:13   CT Cervical Spine Wo Contrast  Result Date: 03/02/2020 CLINICAL DATA:  86 year old female with facial trauma. EXAM: CT HEAD WITHOUT CONTRAST CT MAXILLOFACIAL WITHOUT CONTRAST CT CERVICAL SPINE WITHOUT CONTRAST TECHNIQUE: Multidetector CT imaging of the head, cervical spine, and maxillofacial structures were performed using the standard protocol without intravenous contrast. Multiplanar CT image reconstructions of the cervical spine and maxillofacial structures were also generated. COMPARISON:  Head CT dated 05/20/2006. FINDINGS: CT HEAD FINDINGS Brain: There is mild  age-related atrophy and chronic microvascular ischemic changes. There is no acute intracranial hemorrhage. No mass effect or midline shift. No extra-axial fluid collection. Vascular: No hyperdense vessel or unexpected calcification. Skull: Normal. Negative for fracture or focal lesion. Other: None CT MAXILLOFACIAL FINDINGS Osseous: No acute fracture. No mandibular subluxation. Orbits: The globes and retro-orbital fat are preserved. Sinuses: Clear. Soft tissues: Negative. CT CERVICAL SPINE FINDINGS Alignment: No acute subluxation. There is reversal of normal cervical lordosis which may be positional or due to muscle spasm or secondary to degenerative changes. Skull base and vertebrae: No acute fracture. Soft tissues and spinal canal: No prevertebral fluid or swelling. No visible canal hematoma. Disc levels: Multilevel degenerative changes. Grade 1 C4-C5 anterolisthesis. Upper chest: Negative. Other: None IMPRESSION: 1. No acute intracranial pathology. Mild age-related atrophy and chronic microvascular ischemic changes. 2. No acute/traumatic cervical spine pathology. 3. No acute facial bone fractures. Electronically Signed   By: Anner Crete M.D.   On: 03/02/2020 20:13   CT Maxillofacial Wo Contrast  Result Date: 03/02/2020 CLINICAL DATA:  86 year old female with facial trauma. EXAM: CT HEAD WITHOUT CONTRAST CT MAXILLOFACIAL WITHOUT CONTRAST CT CERVICAL SPINE WITHOUT CONTRAST TECHNIQUE: Multidetector CT imaging of the head, cervical spine, and maxillofacial structures were performed using the standard protocol without intravenous contrast. Multiplanar CT image reconstructions of the cervical spine and maxillofacial structures were also generated. COMPARISON:  Head CT dated 05/20/2006. FINDINGS: CT HEAD FINDINGS Brain: There is mild age-related atrophy and chronic microvascular ischemic changes. There is no acute intracranial hemorrhage. No mass effect or midline shift. No extra-axial fluid collection. Vascular:  No hyperdense vessel or unexpected calcification. Skull: Normal. Negative for fracture or focal lesion. Other: None CT MAXILLOFACIAL FINDINGS Osseous: No acute fracture. No mandibular subluxation. Orbits: The globes and retro-orbital fat are preserved. Sinuses: Clear. Soft tissues: Negative. CT CERVICAL SPINE FINDINGS Alignment: No acute subluxation. There is reversal of normal cervical lordosis which may be positional or due to muscle spasm or secondary to degenerative changes. Skull base and vertebrae: No acute fracture. Soft tissues and spinal canal: No prevertebral fluid or swelling. No visible canal hematoma. Disc levels: Multilevel degenerative changes. Grade 1 C4-C5 anterolisthesis. Upper chest: Negative. Other: None IMPRESSION: 1. No acute intracranial pathology. Mild age-related atrophy and chronic microvascular ischemic changes. 2. No acute/traumatic cervical spine pathology. 3. No acute facial bone fractures. Electronically Signed   By: Anner Crete M.D.   On: 03/02/2020 20:13    Assessment & Plan:   Problem List Items Addressed This Visit      Unprioritized   Hypothyroidism - Primary   Relevant Orders   TSH (Completed)   Chronic kidney disease (CKD), stage III (moderate)   Relevant Orders   Comprehensive metabolic panel (Completed)   CBC with Differential/Platelet (Completed)   Concussion    Secondary to blunt head trauma during recent fall. .advised to  rest , refrain from activity that taxes her brain       Widowed     Patient is dealing with the unexpected loss of spouse and has adequate coping skills and emotional support .  i have asked patinet to return in one month to examine for signs of unresolving grief.        Other Visit Diagnoses    Vitamin D deficiency       Relevant Orders   VITAMIN D 25 Hydroxy (Vit-D Deficiency, Fractures) (Completed)   B12 deficiency       Relevant Orders   Vitamin B12 (Completed)      I provided  30 minutes of  face-to-face time  during this encounter reviewing patient's current problems and past surgeries, labs and imaging studies, providing counseling on the above mentioned problems , and coordination  of care .  I have changed Jerene Pitch. Demirjian's escitalopram. I am also having her maintain her acetaminophen, vitamin B-12, diclofenac sodium, Multiple Vitamins-Minerals (CENTRUM SILVER PO), polyethylene glycol, protein supplement, HYDROcodone-acetaminophen, ondansetron, sennosides-docusate sodium, meloxicam, methylPREDNISolone, liothyronine, Dotti, amitriptyline, and atorvastatin.  Meds ordered this encounter  Medications  . escitalopram (LEXAPRO) 10 MG tablet    Sig: Take 1 tablet (10 mg total) by mouth at bedtime.    Dispense:  30 tablet    Refill:  5    OK TO REFILL EARLY    Medications Discontinued During This Encounter  Medication Reason  . escitalopram (LEXAPRO) 10 MG tablet Reorder    Follow-up: No follow-ups on file.   Crecencio Mc, MD

## 2020-03-09 NOTE — Patient Instructions (Signed)
Concussion, Adult  A concussion is a brain injury from a hard, direct hit (trauma) to your head or body. This direct hit causes the brain to quickly shake back and forth inside the skull. A concussion may also be called a mild traumatic brain injury (TBI). Healing from this injury can take time. What are the causes? This condition is caused by:  A direct hit to your head, such as: ? Running into a player during a game. ? Being hit in a fight. ? Hitting your head on a hard surface.  A quick and sudden movement (jolt) of the head or neck, such as in a car crash. What are the signs or symptoms? The signs of a concussion can be hard to notice. They may be missed by you, family members, and doctors. You may look fine on the outside but may not act or feel normal. Physical symptoms  Headaches.  Being tired (fatigued).  Being dizzy.  Problems with body balance.  Problems seeing or hearing.  Being sensitive to light or noise.  Feeling sick to your stomach (nausea) or throwing up (vomiting).  Not sleeping or eating as you used to.  Loss of feeling (numbness) or tingling in the body.  Seizure. Mental and emotional symptoms  Problems remembering things.  Trouble focusing your mind (concentrating), organizing, or making decisions.  Being slow to think, act, react, speak, or read.  Feeling grouchy (irritable).  Having mood changes.  Feeling worried or nervous (anxious).  Feeling sad (depressed). How is this treated? This condition may be treated by:  Stopping sports or activity if you are injured. If you hit your head or have signs of concussion: ? Do not return to sports or activities the same day. ? Get checked by a doctor before you return to your activities.  Resting your body and your mind.  Being watched carefully, often at home.  Medicines to help with symptoms such as: ? Feeling sick to your stomach. ? Headaches. ? Problems with sleep.  Avoid taking strong  pain medicines (opioids) for a concussion.  Avoiding alcohol and drugs.  Being asked to go to a concussion clinic or a place to help you recover (rehabilitation center). Recovery from a concussion can take time. Return to activities only:  When you are fully healed.  When your doctor says it is safe. Follow these instructions at home: Activity  Limit activities that need a lot of thought or focus, such as: ? Homework or work for your job. ? Watching TV. ? Using the computer or phone. ? Playing memory games and puzzles.  Rest. Rest helps your brain heal. Make sure you: ? Get plenty of sleep. Most adults should get 7-9 hours of sleep each night. ? Rest during the day. Take naps or breaks when you feel tired.  Avoid activity like exercise until your doctor says its safe. Stop any activity that makes symptoms worse.  Do not do activities that could cause a second concussion, such as riding a bike or playing sports.  Ask your doctor when you can return to your normal activities, such as school, work, sports, and driving. Your ability to react may be slower. Do not do these activities if you are dizzy. General instructions   Take over-the-counter and prescription medicines only as told by your doctor.  Do not drink alcohol until your doctor says you can.  Watch your symptoms and tell other people to do the same. Other problems can occur after a concussion. Older   adults have a higher risk of serious problems.  Tell your work manager, teachers, school nurse, school counselor, coach, or athletic trainer about your injury and symptoms. Tell them about what you can or cannot do.  Keep all follow-up visits as told by your doctor. This is important. How is this prevented?  It is very important that you do not get another brain injury. In rare cases, another injury can cause brain damage that will not go away, brain swelling, or death. The risk of this is greatest in the first 7-10 days  after a head injury. To avoid injuries: ? Stop activities that could lead to a second concussion, such as contact sports, until your doctor says it is okay. ? When you return to sports or activities:  Do not crash into other players. This is how most concussions happen.  Follow the rules.  Respect other players. ? Get regular exercise. Do strength and balance training. ? Wear a helmet that fits you well during sports, biking, or other activities.  Helmets can help protect you from serious skull and brain injuries, but they do not protect you from a concussion. Even when wearing a helmet, you should avoid being hit in the head. Contact a doctor if:  Your symptoms get worse or they do not get better.  You have new symptoms.  You have another injury. Get help right away if:  You have bad headaches or your headaches get worse.  You feel weak or numb in any part of your body.  You are mixed up (confused).  Your balance gets worse.  You keep throwing up.  You feel more sleepy than normal.  Your speech is not clear (is slurred).  You cannot recognize people or places.  You have a seizure.  Others have trouble waking you up.  You have behavior changes.  You have changes in how you see (vision).  You pass out (lose consciousness). Summary  A concussion is a brain injury from a hard, direct hit (trauma) to your head or body.  This condition is treated with rest and careful watching of symptoms.  If you keep having symptoms, call your doctor. This information is not intended to replace advice given to you by your health care provider. Make sure you discuss any questions you have with your health care provider. Document Revised: 07/01/2018 Document Reviewed: 07/01/2018 Elsevier Patient Education  2020 [employer]  

## 2020-03-11 ENCOUNTER — Other Ambulatory Visit: Payer: Self-pay | Admitting: Internal Medicine

## 2020-03-11 DIAGNOSIS — S060X9A Concussion with loss of consciousness of unspecified duration, initial encounter: Secondary | ICD-10-CM | POA: Insufficient documentation

## 2020-03-11 DIAGNOSIS — S060XAA Concussion with loss of consciousness status unknown, initial encounter: Secondary | ICD-10-CM | POA: Insufficient documentation

## 2020-03-11 DIAGNOSIS — Z634 Disappearance and death of family member: Secondary | ICD-10-CM | POA: Insufficient documentation

## 2020-03-11 HISTORY — DX: Concussion with loss of consciousness of unspecified duration, initial encounter: S06.0X9A

## 2020-03-11 HISTORY — DX: Concussion with loss of consciousness status unknown, initial encounter: S06.0XAA

## 2020-03-11 MED ORDER — POTASSIUM CHLORIDE CRYS ER 20 MEQ PO TBCR: 20.0000 meq | EXTENDED_RELEASE_TABLET | Freq: Every day | ORAL | 0 refills | Status: DC

## 2020-03-11 NOTE — Assessment & Plan Note (Signed)
Secondary to blunt head trauma during recent fall. .advised to rest , refrain from activity that taxes her brain

## 2020-03-11 NOTE — Assessment & Plan Note (Signed)
Patient is dealing with the unexpected loss of spouse and has adequate coping skills and emotional support .  i have asked patinet to return in one month to examine for signs of unresolving grief.   

## 2020-03-12 ENCOUNTER — Other Ambulatory Visit: Payer: Self-pay | Admitting: Internal Medicine

## 2020-03-12 DIAGNOSIS — H524 Presbyopia: Secondary | ICD-10-CM | POA: Diagnosis not present

## 2020-03-12 DIAGNOSIS — H43813 Vitreous degeneration, bilateral: Secondary | ICD-10-CM | POA: Diagnosis not present

## 2020-03-12 DIAGNOSIS — H5203 Hypermetropia, bilateral: Secondary | ICD-10-CM | POA: Diagnosis not present

## 2020-03-12 DIAGNOSIS — H52223 Regular astigmatism, bilateral: Secondary | ICD-10-CM | POA: Diagnosis not present

## 2020-03-12 DIAGNOSIS — H2513 Age-related nuclear cataract, bilateral: Secondary | ICD-10-CM | POA: Diagnosis not present

## 2020-03-12 DIAGNOSIS — H25013 Cortical age-related cataract, bilateral: Secondary | ICD-10-CM | POA: Diagnosis not present

## 2020-04-04 ENCOUNTER — Other Ambulatory Visit: Payer: Self-pay | Admitting: Internal Medicine

## 2020-04-25 ENCOUNTER — Ambulatory Visit (INDEPENDENT_AMBULATORY_CARE_PROVIDER_SITE_OTHER): Payer: Medicare Other | Admitting: Internal Medicine

## 2020-04-25 ENCOUNTER — Other Ambulatory Visit: Payer: Self-pay

## 2020-04-25 ENCOUNTER — Ambulatory Visit (INDEPENDENT_AMBULATORY_CARE_PROVIDER_SITE_OTHER): Payer: Medicare Other

## 2020-04-25 ENCOUNTER — Encounter: Payer: Self-pay | Admitting: Internal Medicine

## 2020-04-25 VITALS — BP 136/66 | HR 84 | Temp 96.9°F | Resp 16 | Ht 62.5 in | Wt 122.0 lb

## 2020-04-25 DIAGNOSIS — M1711 Unilateral primary osteoarthritis, right knee: Secondary | ICD-10-CM

## 2020-04-25 DIAGNOSIS — N1831 Chronic kidney disease, stage 3a: Secondary | ICD-10-CM

## 2020-04-25 DIAGNOSIS — M47816 Spondylosis without myelopathy or radiculopathy, lumbar region: Secondary | ICD-10-CM | POA: Diagnosis not present

## 2020-04-25 DIAGNOSIS — R293 Abnormal posture: Secondary | ICD-10-CM

## 2020-04-25 DIAGNOSIS — E034 Atrophy of thyroid (acquired): Secondary | ICD-10-CM | POA: Diagnosis not present

## 2020-04-25 DIAGNOSIS — E876 Hypokalemia: Secondary | ICD-10-CM | POA: Diagnosis not present

## 2020-04-25 NOTE — Progress Notes (Signed)
Patient ID: Emily Wu, female    DOB: 04-25-1931  Age: 86 y.o. MRN: [id number]  The patient is here for  Follow up and management of other chronic and acute problems.   The risk factors are reflected in the social history.  The roster of all physicians providing medical care to patient - is listed in the Snapshot section of the chart.  Activities of daily living:  The patient is 100% independent in all ADLs: dressing, toileting, feeding as well as independent mobility  Home safety : The patient has smoke detectors in the home. They wear seatbelts.  There are no firearms at home. There is no violence in the home.   There is no risks for hepatitis, STDs or HIV. There is no   history of blood transfusion. They have no travel history to infectious disease endemic areas of the world.  The patient has seen their dentist in the last six month. They have seen their eye doctor in the last year. They admit to slight hearing difficulty with regard to whispered voices and some television programs.  They have deferred audiologic testing in the last year.  They do not  have excessive sun exposure. Discussed the need for sun protection: hats, long sleeves and use of sunscreen if there is significant sun exposure.   Diet: the importance of a healthy diet is discussed. They do have a healthy diet.  The benefits of regular aerobic exercise were discussed. She walks 4 times per week ,  20 minutes.   Depression screen: there are no signs or vegative symptoms of depression- irritability, change in appetite, anhedonia, sadness/tearfullness.  Cognitive assessment: the patient's deceased husband had managed all their financial affairs and she has now deferred all financial matters to her husband's lawyer.  She is  actively engaged and has two daughters, Emily Wu and Emily Wu,  who are closeby and emotionally supportive.  They could relate day,date,year and events; recalled 2/3 objects at 3 minutes; performed clock-face  test normally.  The following portions of the patient's history were reviewed and updated as appropriate: allergies, current medications, past family history, past medical history,  past surgical history, past social history  and problem list.  Visual acuity was not assessed per patient preference since she has regular follow up with her ophthalmologist. Hearing and body mass index were assessed and reviewed.   During the course of the visit the patient was educated and counseled about appropriate screening and preventive services including : fall prevention , diabetes screening, nutrition counseling, colorectal cancer screening, and recommended immunizations.    CC: The primary encounter diagnosis was Abnormal posture. Diagnoses of Hypokalemia, Stage 3a chronic kidney disease, Osteoarthritis of right knee, unspecified osteoarthritis type, and Hypothyroidism due to acquired atrophy of thyroid were also pertinent to this visit.  Still grieving loss of husband Emily Wu in March.  Aggravated by the paperwork . Husband left her "well off"  So she has no financial worries. Turned it all over to her lawyer recently and feels less burdened.  Sleeping better with nightly use of lexapro and elavil Using voltaren gel on right knee prn  Using a knee sleeve (new) ordered by daughter Emily Wu ,  Knee feels better today   Cc:  Recurrent episodes of dizziness when bending over and when walking , feels like she is  leaning to the left.   Last injection of right knee was over a year ago Has not seen Orthopedics for right knee  Last mammogram 2016  History  Emily Wu has a past medical history of Arthritis, Bruises easily, Cervical cancer ([place]) (1965), High cholesterol, History of kidney stones, Hypertension, and Tendency toward bleeding easily ([place]).   She has a past surgical history that includes Knee arthroscopy (Left); Shoulder open rotator cuff repair (Bilateral, 2007,  2010); Abdominal hysterectomy (1965);  Appendectomy; Cholecystectomy; Throat surgery (2014); and Partial knee arthroplasty (Left, 01/26/2019).   Her family history includes Heart disease (age of onset: 5) in her father; Hyperlipidemia in her father; Peripheral vascular disease in her mother; Stroke in her father.She reports that she has quit smoking. Her smoking use included cigarettes. She has a 14.00 pack-year smoking history. She has never used smokeless tobacco. She reports that she does not drink alcohol or use drugs.  Outpatient Medications Prior to Visit  Medication Sig Dispense Refill  . acetaminophen (TYLENOL) 650 MG CR tablet Take 650 mg by mouth every 8 (eight) hours as needed for pain.     Marland Kitchen amitriptyline (ELAVIL) 25 MG tablet TAKE ONE TABLET AT BEDTIME NEED APP FOR NEXT REFILL 90 tablet 0  . atorvastatin (LIPITOR) 20 MG tablet TAKE ONE TABLET EVERY DAY 90 tablet 1  . DOTTI 0.1 MG/24HR patch CHANGE PATCH TWICE WEEKLY 8 patch 1  . escitalopram (LEXAPRO) 10 MG tablet Take 1 tablet (10 mg total) by mouth at bedtime. 30 tablet 5  . liothyronine (CYTOMEL) 5 MCG tablet TAKE TWO TABLETS EVERY DAY 180 tablet 0  . methylPREDNISolone (MEDROL DOSEPAK) 4 MG TBPK tablet Take according to pack instructions 21 tablet 0  . Multiple Vitamins-Minerals (CENTRUM SILVER PO) Take 1 tablet by mouth daily.    . ondansetron (ZOFRAN) 4 MG tablet Take 1 tablet (4 mg total) by mouth every 8 (eight) hours as needed for nausea or vomiting. 10 tablet 0  . polyethylene glycol (MIRALAX / GLYCOLAX) packet Take 17 g by mouth daily as needed for moderate constipation.     . potassium chloride SA (KLOR-CON) 20 MEQ tablet Take 1 tablet (20 mEq total) by mouth daily. 5 tablet 0  . protein supplement (RESOURCE BENEPROTEIN) POWD Take 1 scoop by mouth daily as needed (constipation).     . vitamin B-12 (CYANOCOBALAMIN) 1000 MCG tablet Take 1,000 mcg by mouth daily.     . diclofenac sodium (VOLTAREN) 1 % GEL Apply 2 g topically 4 (four) times daily as needed (for knee  pain). (Patient not taking: Reported on 12/23/2019) 200 g 11  . sennosides-docusate sodium (SENOKOT-S) 8.6-50 MG tablet Take 2 tablets by mouth daily. (Patient not taking: Reported on 12/23/2019) 30 tablet 1  . HYDROcodone-acetaminophen (NORCO) 5-325 MG tablet Take 1-2 tablets by mouth every 6 (six) hours as needed for moderate pain. MAXIMUM TOTAL ACETAMINOPHEN DOSE IS 4000 MG PER DAY (Patient not taking: Reported on 12/23/2019) 30 tablet 0  . meloxicam (MOBIC) 7.5 MG tablet TAKE 1 TABLET BY MOUTH DAILY AS NEEDED FOR JOINT PAIN (Patient not taking: Reported on 12/23/2019) 90 tablet 1   No facility-administered medications prior to visit.    Review of Systems   Patient denies headache, fevers, malaise, unintentional weight loss, skin rash, eye pain, sinus congestion and sinus pain, sore throat, dysphagia,  hemoptysis , cough, dyspnea, wheezing, chest pain, palpitations, orthopnea, edema, abdominal pain, nausea, melena, diarrhea, constipation, flank pain, dysuria, hematuria, urinary  Frequency, nocturia, numbness, tingling, seizures,  Focal weakness, Loss of consciousness,  Tremor, insomnia, depression, anxiety, and suicidal ideation.      Objective:  BP 136/66 (BP Location: Left Arm, Patient Position: Sitting, Cuff  Size: Normal)   Pulse 84   Temp (!) 96.9 F (36.1 C) (Temporal)   Resp 16   Ht 5' 2.5" (1.588 m)   Wt 122 lb (55.3 kg)   SpO2 98%   BMI 21.96 kg/m   Physical Exam  General appearance: alert, cooperative and appears stated age Ears: normal TM's and external ear canals both ears Throat: lips, mucosa, and tongue normal; teeth and gums normal Neck: no adenopathy, no carotid bruit, supple, symmetrical, trachea midline and thyroid not enlarged, symmetric, no tenderness/mass/nodules Back: symmetric, no curvature. ROM normal. No CVA tenderness. Lungs: clear to auscultation bilaterally Heart: regular rate and rhythm, S1, S2 normal, no murmur, click, rub or gallop Abdomen: soft,  non-tender; bowel sounds normal; no masses,  no organomegaly Pulses: 2+ and symmetric Skin: Skin color, texture, turgor normal. No rashes or lesions Lymph nodes: Cervical, supraclavicular, and axillary nodes normal. Neuro:  awake and interactive with normal mood and affect. Higher cortical functions are normal. Speech is clear without word-finding difficulty or dysarthria. Extraocular movements are intact. Visual fields of both eyes are grossly intact. Sensation to light touch is grossly intact bilaterally of upper and lower extremities. Motor examination shows 4+/5 symmetric hand grip and upper extremity and 5/5 lower extremity strength. There is no pronation or drift. GAit is antalgic,  Favoring her right knee. Walks hunched over and leans on right thigh.   Assessment & Plan:   Problem List Items Addressed This Visit      Unprioritized   Abnormal posture - Primary    Appears to be due to antalgic gait.  Palin films show advanced thoracolumbar kyphosis and degenerative changes       Relevant Orders   DG Lumbar Spine Complete (Completed)   Chronic kidney disease (CKD), stage III (moderate)    Renal function has returned to baseline with avoidance of NSAIDs.  She has no history of  Hypertension and is taking a  statin for control of hyperlipidemia.       Hypokalemia    Repeat level is normal , along with magnesium  Lab Results  Component Value Date   NA 139 04/25/2020   K 3.7 04/25/2020   CL 103 04/25/2020   CO2 29 04/25/2020         Relevant Orders   Basic metabolic panel (Completed)   Magnesium (Completed)   Hypothyroidism    Thyroid function is WNL on current dose.  No current changes needed.   Lab Results  Component Value Date   TSH 1.95 03/09/2020         Osteoarthritis of right knee    She is in considerable pain without use of NSAIDS and will return for an I/A injection          I have discontinued Jerene Pitch. Yepez's HYDROcodone-acetaminophen and  meloxicam. I am also having her maintain her acetaminophen, vitamin B-12, diclofenac sodium, Multiple Vitamins-Minerals (CENTRUM SILVER PO), polyethylene glycol, protein supplement, ondansetron, sennosides-docusate sodium, methylPREDNISolone, Dotti, atorvastatin, escitalopram, potassium chloride SA, liothyronine, and amitriptyline.  No orders of the defined types were placed in this encounter.   Medications Discontinued During This Encounter  Medication Reason  . meloxicam (MOBIC) 7.5 MG tablet   . HYDROcodone-acetaminophen (NORCO) 5-325 MG tablet     Follow-up: Return in about 1 week (around 05/02/2020).   Crecencio Mc, MD

## 2020-04-25 NOTE — Patient Instructions (Addendum)
Come back and I will inject your knee   You can take  up to 2000 mg of acetominophen (tylenol) every day safely  In divided doses (500 mg every 6 hours  Or 1000 mg every 12 hours.)   Health Maintenance After Age 84 After age 47, you are at a higher risk for certain long-term diseases and infections as well as injuries from falls. Falls are a major cause of broken bones and head injuries in people who are older than age 31. Getting regular preventive care can help to keep you healthy and well. Preventive care includes getting regular testing and making lifestyle changes as recommended by your health care provider. Talk with your health care provider about:  Which screenings and tests you should have. A screening is a test that checks for a disease when you have no symptoms.  A diet and exercise plan that is right for you. What should I know about screenings and tests to prevent falls? Screening and testing are the best ways to find a health problem early. Early diagnosis and treatment give you the best chance of managing medical conditions that are common after age 62. Certain conditions and lifestyle choices may make you more likely to have a fall. Your health care provider may recommend:  Regular vision checks. Poor vision and conditions such as cataracts can make you more likely to have a fall. If you wear glasses, make sure to get your prescription updated if your vision changes.  Medicine review. Work with your health care provider to regularly review all of the medicines you are taking, including over-the-counter medicines. Ask your health care provider about any side effects that may make you more likely to have a fall. Tell your health care provider if any medicines that you take make you feel dizzy or sleepy.  Osteoporosis screening. Osteoporosis is a condition that causes the bones to get weaker. This can make the bones weak and cause them to break more easily.  Blood pressure  screening. Blood pressure changes and medicines to control blood pressure can make you feel dizzy.  Strength and balance checks. Your health care provider may recommend certain tests to check your strength and balance while standing, walking, or changing positions.  Foot health exam. Foot pain and numbness, as well as not wearing proper footwear, can make you more likely to have a fall.  Depression screening. You may be more likely to have a fall if you have a fear of falling, feel emotionally low, or feel unable to do activities that you used to do.  Alcohol use screening. Using too much alcohol can affect your balance and may make you more likely to have a fall. What actions can I take to lower my risk of falls? General instructions  Talk with your health care provider about your risks for falling. Tell your health care provider if: ? You fall. Be sure to tell your health care provider about all falls, even ones that seem minor. ? You feel dizzy, sleepy, or off-balance.  Take over-the-counter and prescription medicines only as told by your health care provider. These include any supplements.  Eat a healthy diet and maintain a healthy weight. A healthy diet includes low-fat dairy products, low-fat (lean) meats, and fiber from whole grains, beans, and lots of fruits and vegetables. Home safety  Remove any tripping hazards, such as rugs, cords, and clutter.  Install safety equipment such as grab bars in bathrooms and safety rails on stairs.  Keep  rooms and walkways well-lit. Activity   Follow a regular exercise program to stay fit. This will help you maintain your balance. Ask your health care provider what types of exercise are appropriate for you.  If you need a cane or walker, use it as recommended by your health care provider.  Wear supportive shoes that have nonskid soles. Lifestyle  Do not drink alcohol if your health care provider tells you not to drink.  If you drink  alcohol, limit how much you have: ? 0-1 drink a day for women. ? 0-2 drinks a day for men.  Be aware of how much alcohol is in your drink. In the [place] one drink equals one typical bottle of beer (12 oz), one-half glass of wine (5 oz), or one shot of hard liquor (1 oz).  Do not use any products that contain nicotine or tobacco, such as cigarettes and e-cigarettes. If you need help quitting, ask your health care provider. Summary  Having a healthy lifestyle and getting preventive care can help to protect your health and wellness after age 69.  Screening and testing are the best way to find a health problem early and help you avoid having a fall. Early diagnosis and treatment give you the best chance for managing medical conditions that are more common for people who are older than age 6.  Falls are a major cause of broken bones and head injuries in people who are older than age 75. Take precautions to prevent a fall at home.  Work with your health care provider to learn what changes you can make to improve your health and wellness and to prevent falls. This information is not intended to replace advice given to you by your health care provider. Make sure you discuss any questions you have with your health care provider. Document Revised: 03/03/2019 Document Reviewed: 09/23/2017 Elsevier Patient Education  2020 [employer].

## 2020-04-26 DIAGNOSIS — M40209 Unspecified kyphosis, site unspecified: Secondary | ICD-10-CM | POA: Insufficient documentation

## 2020-04-26 DIAGNOSIS — E876 Hypokalemia: Secondary | ICD-10-CM | POA: Insufficient documentation

## 2020-04-26 DIAGNOSIS — R293 Abnormal posture: Secondary | ICD-10-CM | POA: Insufficient documentation

## 2020-04-26 LAB — BASIC METABOLIC PANEL
BUN: 12 mg/dL (ref 6–23)
CO2: 29 mEq/L (ref 19–32)
Calcium: 9 mg/dL (ref 8.4–10.5)
Chloride: 103 mEq/L (ref 96–112)
Creatinine, Ser: 0.86 mg/dL (ref 0.40–1.20)
GFR: 62.43 mL/min (ref >60.00)
Glucose, Bld: 92 mg/dL (ref 70–99)
Potassium: 3.7 mEq/L (ref 3.5–5.1)
Sodium: 139 mEq/L (ref 135–145)

## 2020-04-26 LAB — MAGNESIUM: Magnesium: 1.8 mg/dL (ref 1.5–2.5)

## 2020-04-26 NOTE — Assessment & Plan Note (Signed)
Renal function has returned to baseline with avoidance of NSAIDs.  She has no history of  Hypertension and is taking a  statin for control of hyperlipidemia.

## 2020-04-26 NOTE — Assessment & Plan Note (Signed)
Thyroid function is WNL on current dose.  No current changes needed.   Lab Results  Component Value Date   TSH 1.95 03/09/2020

## 2020-04-26 NOTE — Assessment & Plan Note (Signed)
She is in considerable pain without use of NSAIDS and will return for an I/A injection

## 2020-04-26 NOTE — Assessment & Plan Note (Addendum)
Repeat level is normal , along with magnesium  Lab Results  Component Value Date   NA 139 04/25/2020   K 3.7 04/25/2020   CL 103 04/25/2020   CO2 29 04/25/2020

## 2020-04-26 NOTE — Assessment & Plan Note (Addendum)
Appears to be due to antalgic gait.  Palin films show advanced thoracolumbar kyphosis and degenerative changes

## 2020-04-27 ENCOUNTER — Other Ambulatory Visit: Payer: Self-pay

## 2020-05-01 ENCOUNTER — Encounter: Payer: Self-pay | Admitting: Internal Medicine

## 2020-05-01 ENCOUNTER — Ambulatory Visit (INDEPENDENT_AMBULATORY_CARE_PROVIDER_SITE_OTHER): Payer: Medicare Other | Admitting: Internal Medicine

## 2020-05-01 ENCOUNTER — Other Ambulatory Visit: Payer: Self-pay

## 2020-05-01 VITALS — BP 128/56 | HR 79 | Temp 96.9°F | Resp 15 | Ht 62.5 in | Wt 123.2 lb

## 2020-05-01 DIAGNOSIS — M1711 Unilateral primary osteoarthritis, right knee: Secondary | ICD-10-CM | POA: Diagnosis not present

## 2020-05-01 MED ORDER — TRIAMCINOLONE ACETONIDE 40 MG/ML IJ SUSP
40.0000 mg | Freq: Once | INTRAMUSCULAR | Status: AC
  Administered 2020-05-01: 40 mg via INTRAMUSCULAR

## 2020-05-01 MED ORDER — LIDOCAINE HCL 1 % IJ SOLN
4.0000 mL | Freq: Once | INTRAMUSCULAR | Status: AC
  Administered 2020-05-01: 4 mL

## 2020-05-01 NOTE — Patient Instructions (Signed)
NO LONG WALKS FOR ONE WEEK!  ICE YOUR KNEE FOR 15 MINUTE 2 TIMES BEFORE BED TONIGHT   OK TO GO OUT TO EAT TOMORROW   Knee Injection A knee injection is a procedure to get medicine into your knee joint to relieve the pain, swelling, and stiffness of arthritis. Your health care provider uses a needle to inject medicine, which may also help to lubricate and cushion your knee joint. You may need more than one injection. Tell a health care provider about:  Any allergies you have.  All medicines you are taking, including vitamins, herbs, eye drops, creams, and over-the-counter medicines.  Any problems you or family members have had with anesthetic medicines.  Any blood disorders you have.  Any surgeries you have had.  Any medical conditions you have.  Whether you are pregnant or may be pregnant. What are the risks? Generally, this is a safe procedure. However, problems may occur, including:  Infection.  Bleeding.  Symptoms that get worse.  Damage to the area around your knee.  Allergic reaction to any of the medicines.  Skin reactions from repeated injections. What happens before the procedure?  Ask your health care provider about changing or stopping your regular medicines. This is especially important if you are taking diabetes medicines or blood thinners.  Plan to have someone take you home from the hospital or clinic. What happens during the procedure?   You will sit or lie down in a position for your knee to be treated.  The skin over your kneecap will be cleaned with a germ-killing soap.  You will be given a medicine that numbs the area (local anesthetic). You may feel some stinging.  The medicine will be injected into your knee. The needle is carefully placed between your kneecap and your knee. The medicine is injected into the joint space.  The needle will be removed at the end of the procedure.  A bandage (dressing) may be placed over the injection  site. The procedure may vary among health care providers and hospitals. What can I expect after the procedure?  Your blood pressure, heart rate, breathing rate, and blood oxygen level will be monitored until you leave the hospital or clinic.  You may have to move your knee through its full range of motion. This helps to get all the medicine into your joint space.  You will be watched to make sure that you do not have a reaction to the injected medicine.  You may feel more pain, swelling, and warmth than you did before the injection. This reaction may last about 1-2 days. Follow these instructions at home: Medicines  Take over-the-counter and prescription medicines only as told by your doctor.  Do not drive or use heavy machinery while taking prescription pain medicine.  Do not take medicines such as aspirin and ibuprofen unless your health care provider tells you to take them. Injection site care  Follow instructions from your health care provider about: ? How to take care of your puncture site. ? When and how you should change your dressing. ? When you should remove your dressing.  Check your injection area every day for signs of infection. Check for: ? More redness, swelling, or pain after 2 days. ? Fluid or blood. ? Pus or a bad smell. ? Warmth. Managing pain, stiffness, and swelling   If directed, put ice on the injection area: ? Put ice in a plastic bag. ? Place a towel between your skin and the bag. ?  Leave the ice on for 20 minutes, 2-3 times per day.  Do not apply heat to your knee.  Raise (elevate) the injection area above the level of your heart while you are sitting or lying down. General instructions  If you were given a dressing, keep it dry until your health care provider says it can be removed. Ask your health care provider when you can start showering or taking a bath.  Avoid strenuous activities for as long as directed by your health care provider. Ask  your health care provider when you can return to your normal activities.  Keep all follow-up visits as told by your health care provider. This is important. You may need more injections. Contact a health care provider if you have:  A fever.  Warmth in your injection area.  Fluid, blood, or pus coming from your injection site.  Symptoms at your injection site that last longer than 2 days after your procedure. Get help right away if:  Your knee: ? Turns very red. ? Becomes very swollen. ? Is in severe pain. Summary  A knee injection is a procedure to get medicine into your knee joint to relieve the pain, swelling, and stiffness of arthritis.  A needle is carefully placed between your kneecap and your knee to inject medicine into the joint space.  Before the procedure, ask your health care provider about changing or stopping your regular medicines, especially if you are taking diabetes medicines or blood thinners.  Contact your health care provider if you have any problems or questions after your procedure. This information is not intended to replace advice given to you by your health care provider. Make sure you discuss any questions you have with your health care provider. Document Revised: 11/30/2017 Document Reviewed: 11/30/2017 Elsevier Patient Education  [employer].

## 2020-05-01 NOTE — Progress Notes (Signed)
Subjective:  Patient ID: Emily Wu, female    DOB: Feb 10, 1931  Age: 86 y.o. MRN: [id number]  CC: There were no encounter diagnoses.  HPI Emily Wu presents for an intrainarticular steroid injection to her right knee.   Outpatient Medications Prior to Visit  Medication Sig Dispense Refill  . acetaminophen (TYLENOL) 650 MG CR tablet Take 650 mg by mouth every 8 (eight) hours as needed for pain.     Marland Kitchen amitriptyline (ELAVIL) 25 MG tablet TAKE ONE TABLET AT BEDTIME NEED APP FOR NEXT REFILL 90 tablet 0  . atorvastatin (LIPITOR) 20 MG tablet TAKE ONE TABLET EVERY DAY 90 tablet 1  . diclofenac sodium (VOLTAREN) 1 % GEL Apply 2 g topically 4 (four) times daily as needed (for knee pain). (Patient not taking: Reported on 12/23/2019) 200 g 11  . DOTTI 0.1 MG/24HR patch CHANGE PATCH TWICE WEEKLY 8 patch 1  . escitalopram (LEXAPRO) 10 MG tablet Take 1 tablet (10 mg total) by mouth at bedtime. 30 tablet 5  . liothyronine (CYTOMEL) 5 MCG tablet TAKE TWO TABLETS EVERY DAY 180 tablet 0  . methylPREDNISolone (MEDROL DOSEPAK) 4 MG TBPK tablet Take according to pack instructions 21 tablet 0  . Multiple Vitamins-Minerals (CENTRUM SILVER PO) Take 1 tablet by mouth daily.    . ondansetron (ZOFRAN) 4 MG tablet Take 1 tablet (4 mg total) by mouth every 8 (eight) hours as needed for nausea or vomiting. 10 tablet 0  . polyethylene glycol (MIRALAX / GLYCOLAX) packet Take 17 g by mouth daily as needed for moderate constipation.     . potassium chloride SA (KLOR-CON) 20 MEQ tablet Take 1 tablet (20 mEq total) by mouth daily. 5 tablet 0  . protein supplement (RESOURCE BENEPROTEIN) POWD Take 1 scoop by mouth daily as needed (constipation).     . sennosides-docusate sodium (SENOKOT-S) 8.6-50 MG tablet Take 2 tablets by mouth daily. (Patient not taking: Reported on 12/23/2019) 30 tablet 1  . vitamin B-12 (CYANOCOBALAMIN) 1000 MCG tablet Take 1,000 mcg by mouth daily.      No facility-administered medications  prior to visit.    Review of Systems;  Patient denies headache, fevers, malaise, unintentional weight loss, skin rash, eye pain, sinus congestion and sinus pain, sore throat, dysphagia,  hemoptysis , cough, dyspnea, wheezing, chest pain, palpitations, orthopnea, edema, abdominal pain, nausea, melena, diarrhea, constipation, flank pain, dysuria, hematuria, urinary  Frequency, nocturia, numbness, tingling, seizures,  Focal weakness, Loss of consciousness,  Tremor, insomnia, depression, anxiety, and suicidal ideation.      Objective:  There were no vitals taken for this visit.  BP Readings from Last 3 Encounters:  04/25/20 136/66  03/09/20 112/64  03/03/20 (!) 102/45    Wt Readings from Last 3 Encounters:  04/25/20 122 lb (55.3 kg)  03/09/20 123 lb (55.8 kg)  09/22/19 123 lb (55.8 kg)    General appearance: alert, cooperative and appears stated age Back: symmetric, no curvature. ROM normal. No CVA tenderness. Lungs: clear to auscultation bilaterally Heart: regular rate and rhythm, S1, S2 normal, no murmur, click, rub or gallop Abdomen: soft, non-tender; bowel sounds normal; no masses,  no organomegaly Pulses: 2+ and symmetric Skin: Skin color, texture, turgor normal. No rashes or lesions Lymph nodes: Cervical, supraclavicular, and axillary nodes normal. MSK:  Right knee without effusion, erythema or warmth.   Lab Results  Component Value Date   HGBA1C 5.8 08/18/2019    Lab Results  Component Value Date   CREATININE 0.86 04/25/2020  CREATININE 0.80 03/09/2020   CREATININE 1.06 09/27/2019    Lab Results  Component Value Date   WBC 6.8 03/09/2020   HGB 12.2 03/09/2020   HCT 36.5 03/09/2020   PLT 354.0 03/09/2020   GLUCOSE 92 04/25/2020   CHOL 161 08/18/2019   TRIG 206.0 (H) 08/18/2019   HDL 41.00 08/18/2019   LDLDIRECT 90.0 08/18/2019   LDLCALC 73 03/19/2018   ALT 16 03/09/2020   AST 22 03/09/2020   NA 139 04/25/2020   K 3.7 04/25/2020   CL 103 04/25/2020    CREATININE 0.86 04/25/2020   BUN 12 04/25/2020   CO2 29 04/25/2020   TSH 1.95 03/09/2020   INR 1.04 12/08/2018   HGBA1C 5.8 08/18/2019    CT Head Wo Contrast  Result Date: 03/02/2020 CLINICAL DATA:  86 year old female with facial trauma. EXAM: CT HEAD WITHOUT CONTRAST CT MAXILLOFACIAL WITHOUT CONTRAST CT CERVICAL SPINE WITHOUT CONTRAST TECHNIQUE: Multidetector CT imaging of the head, cervical spine, and maxillofacial structures were performed using the standard protocol without intravenous contrast. Multiplanar CT image reconstructions of the cervical spine and maxillofacial structures were also generated. COMPARISON:  Head CT dated 05/20/2006. FINDINGS: CT HEAD FINDINGS Brain: There is mild age-related atrophy and chronic microvascular ischemic changes. There is no acute intracranial hemorrhage. No mass effect or midline shift. No extra-axial fluid collection. Vascular: No hyperdense vessel or unexpected calcification. Skull: Normal. Negative for fracture or focal lesion. Other: None CT MAXILLOFACIAL FINDINGS Osseous: No acute fracture. No mandibular subluxation. Orbits: The globes and retro-orbital fat are preserved. Sinuses: Clear. Soft tissues: Negative. CT CERVICAL SPINE FINDINGS Alignment: No acute subluxation. There is reversal of normal cervical lordosis which may be positional or due to muscle spasm or secondary to degenerative changes. Skull base and vertebrae: No acute fracture. Soft tissues and spinal canal: No prevertebral fluid or swelling. No visible canal hematoma. Disc levels: Multilevel degenerative changes. Grade 1 C4-C5 anterolisthesis. Upper chest: Negative. Other: None IMPRESSION: 1. No acute intracranial pathology. Mild age-related atrophy and chronic microvascular ischemic changes. 2. No acute/traumatic cervical spine pathology. 3. No acute facial bone fractures. Electronically Signed   By: Anner Crete M.D.   On: 03/02/2020 20:13   CT Cervical Spine Wo Contrast  Result Date:  03/02/2020 CLINICAL DATA:  86 year old female with facial trauma. EXAM: CT HEAD WITHOUT CONTRAST CT MAXILLOFACIAL WITHOUT CONTRAST CT CERVICAL SPINE WITHOUT CONTRAST TECHNIQUE: Multidetector CT imaging of the head, cervical spine, and maxillofacial structures were performed using the standard protocol without intravenous contrast. Multiplanar CT image reconstructions of the cervical spine and maxillofacial structures were also generated. COMPARISON:  Head CT dated 05/20/2006. FINDINGS: CT HEAD FINDINGS Brain: There is mild age-related atrophy and chronic microvascular ischemic changes. There is no acute intracranial hemorrhage. No mass effect or midline shift. No extra-axial fluid collection. Vascular: No hyperdense vessel or unexpected calcification. Skull: Normal. Negative for fracture or focal lesion. Other: None CT MAXILLOFACIAL FINDINGS Osseous: No acute fracture. No mandibular subluxation. Orbits: The globes and retro-orbital fat are preserved. Sinuses: Clear. Soft tissues: Negative. CT CERVICAL SPINE FINDINGS Alignment: No acute subluxation. There is reversal of normal cervical lordosis which may be positional or due to muscle spasm or secondary to degenerative changes. Skull base and vertebrae: No acute fracture. Soft tissues and spinal canal: No prevertebral fluid or swelling. No visible canal hematoma. Disc levels: Multilevel degenerative changes. Grade 1 C4-C5 anterolisthesis. Upper chest: Negative. Other: None IMPRESSION: 1. No acute intracranial pathology. Mild age-related atrophy and chronic microvascular ischemic changes. 2. No acute/traumatic  cervical spine pathology. 3. No acute facial bone fractures. Electronically Signed   By: Anner Crete M.D.   On: 03/02/2020 20:13   CT Maxillofacial Wo Contrast  Result Date: 03/02/2020 CLINICAL DATA:  86 year old female with facial trauma. EXAM: CT HEAD WITHOUT CONTRAST CT MAXILLOFACIAL WITHOUT CONTRAST CT CERVICAL SPINE WITHOUT CONTRAST TECHNIQUE:  Multidetector CT imaging of the head, cervical spine, and maxillofacial structures were performed using the standard protocol without intravenous contrast. Multiplanar CT image reconstructions of the cervical spine and maxillofacial structures were also generated. COMPARISON:  Head CT dated 05/20/2006. FINDINGS: CT HEAD FINDINGS Brain: There is mild age-related atrophy and chronic microvascular ischemic changes. There is no acute intracranial hemorrhage. No mass effect or midline shift. No extra-axial fluid collection. Vascular: No hyperdense vessel or unexpected calcification. Skull: Normal. Negative for fracture or focal lesion. Other: None CT MAXILLOFACIAL FINDINGS Osseous: No acute fracture. No mandibular subluxation. Orbits: The globes and retro-orbital fat are preserved. Sinuses: Clear. Soft tissues: Negative. CT CERVICAL SPINE FINDINGS Alignment: No acute subluxation. There is reversal of normal cervical lordosis which may be positional or due to muscle spasm or secondary to degenerative changes. Skull base and vertebrae: No acute fracture. Soft tissues and spinal canal: No prevertebral fluid or swelling. No visible canal hematoma. Disc levels: Multilevel degenerative changes. Grade 1 C4-C5 anterolisthesis. Upper chest: Negative. Other: None IMPRESSION: 1. No acute intracranial pathology. Mild age-related atrophy and chronic microvascular ischemic changes. 2. No acute/traumatic cervical spine pathology. 3. No acute facial bone fractures. Electronically Signed   By: Anner Crete M.D.   On: 03/02/2020 20:13    Assessment & Plan:   Problem List Items Addressed This Visit    None      I am having Emily Wu maintain her acetaminophen, vitamin B-12, diclofenac sodium, Multiple Vitamins-Minerals (CENTRUM SILVER PO), polyethylene glycol, protein supplement, ondansetron, sennosides-docusate sodium, methylPREDNISolone, Dotti, atorvastatin, escitalopram, potassium chloride SA, liothyronine, and  amitriptyline.  No orders of the defined types were placed in this encounter.   There are no discontinued medications.  Follow-up: No follow-ups on file.   Crecencio Mc, MD

## 2020-05-02 ENCOUNTER — Encounter: Payer: Self-pay | Admitting: Internal Medicine

## 2020-05-02 NOTE — Assessment & Plan Note (Signed)
After obtaining informed consent for  an I/A injection of the right knee,  The right knee was cleaned with betadine and alcohol.  Topical anesthetic was sprayed on medial side of patella and 40 mg Kenalog mixed with 4 ml 1% lidocaine was injected without difficulty into the bursa,  Patient tolerated the procedure without complications or bleeding.  Patient was advised to apply ice for 15 minutes every few hours for the first 24 hours and to avoid strenuous activity for up to 7 days    

## 2020-05-29 ENCOUNTER — Other Ambulatory Visit: Payer: Self-pay | Admitting: Internal Medicine

## 2020-06-05 ENCOUNTER — Ambulatory Visit (INDEPENDENT_AMBULATORY_CARE_PROVIDER_SITE_OTHER): Payer: Medicare Other

## 2020-06-05 VITALS — Ht 62.5 in | Wt 123.0 lb

## 2020-06-05 DIAGNOSIS — Z0000 Encounter for general adult medical examination without abnormal findings: Secondary | ICD-10-CM

## 2020-06-05 NOTE — Progress Notes (Addendum)
Subjective:   Emily Wu is a 86 y.o. female who presents for Medicare Annual (Subsequent) preventive examination.  Review of Systems    No ROS.  Medicare Wellness Virtual Visit.   Cardiac Risk Factors include: advanced age (>12men, >65 women);hypertension     Objective:    Today's Vitals   06/05/20 1410  Weight: 123 lb (55.8 kg)  Height: 5' 2.5" (1.588 m)   Body mass index is 22.14 kg/m.  Advanced Directives 06/05/2020 03/02/2020 06/02/2019 01/27/2019 01/11/2019 12/08/2018 12/08/2018  Does Patient Have a Medical Advance Directive? Yes No Yes Yes Yes Yes Yes  Type of Paramedic of [place];Living will - [place];Living will [place];Living will [place];Living will [place];Living will -  Does patient want to make changes to medical advance directive? No - Patient declined - No - Patient declined No - Patient declined No - Patient declined No - Patient declined -  Copy of [place] in Chart? No - copy requested - No - copy requested - - No - copy requested -  Would patient like information on creating a medical advance directive? - No - Patient declined - - - - -    Current Medications (verified) Outpatient Encounter Medications as of 06/05/2020  Medication Sig  . acetaminophen (TYLENOL) 650 MG CR tablet Take 650 mg by mouth every 8 (eight) hours as needed for pain.   Marland Kitchen amitriptyline (ELAVIL) 25 MG tablet TAKE ONE TABLET AT BEDTIME NEED APP FOR NEXT REFILL  . atorvastatin (LIPITOR) 20 MG tablet TAKE ONE TABLET EVERY DAY  . diclofenac sodium (VOLTAREN) 1 % GEL Apply 2 g topically 4 (four) times daily as needed (for knee pain). (Patient not taking: Reported on 12/23/2019)  . DOTTI 0.1 MG/24HR patch CHANGE PATCH TWICE WEEKLY  . escitalopram (LEXAPRO) 10 MG tablet Take 1 tablet (10 mg total) by mouth at bedtime.  Marland Kitchen liothyronine (CYTOMEL) 5 MCG tablet TAKE TWO  TABLETS EVERY DAY  . Multiple Vitamins-Minerals (CENTRUM SILVER PO) Take 1 tablet by mouth daily.  . ondansetron (ZOFRAN) 4 MG tablet Take 1 tablet (4 mg total) by mouth every 8 (eight) hours as needed for nausea or vomiting.  . polyethylene glycol (MIRALAX / GLYCOLAX) packet Take 17 g by mouth daily as needed for moderate constipation.   . potassium chloride SA (KLOR-CON) 20 MEQ tablet Take 1 tablet (20 mEq total) by mouth daily.  . protein supplement (RESOURCE BENEPROTEIN) POWD Take 1 scoop by mouth daily as needed (constipation).   . sennosides-docusate sodium (SENOKOT-S) 8.6-50 MG tablet Take 2 tablets by mouth daily. (Patient not taking: Reported on 12/23/2019)  . vitamin B-12 (CYANOCOBALAMIN) 1000 MCG tablet Take 1,000 mcg by mouth daily.    No facility-administered encounter medications on file as of 06/05/2020.    Allergies (verified) Tramadol, Amoxicillin, Promethazine hcl, and Sulfa drugs cross reactors   History: Past Medical History:  Diagnosis Date  . Arthritis    "knees, shoulders, chest" (02/03/2014)  . Bruises easily   . Cervical cancer ([place]) 1965  . High cholesterol   . History of kidney stones   . Hypertension    DENIES   . Tendency toward bleeding easily [facility])    Past Surgical History:  Procedure Laterality Date  . ABDOMINAL HYSTERECTOMY  1965  . APPENDECTOMY    . CHOLECYSTECTOMY    . KNEE ARTHROSCOPY Left    [place]  . PARTIAL KNEE ARTHROPLASTY Left 01/26/2019  Procedure: UNICOMPARTMENTAL KNEE;  Surgeon: Marchia Bond, MD;  Location: [facility];  Service: Orthopedics;  Laterality: Left;  . SHOULDER OPEN ROTATOR CUFF REPAIR Bilateral 2007,  2010   [place]  . THROAT SURGERY  2014   "arthritis hugh knot" (02/03/2014)   Family History  Problem Relation Age of Onset  . Peripheral vascular disease Mother   . Stroke Father   . Hyperlipidemia Father   . Heart disease Father 68  . Cancer Neg Hx    Social History   Socioeconomic History  . Marital status: Married     Spouse name: Not on file  . Number of children: Not on file  . Years of education: Not on file  . Highest education level: Not on file  Occupational History  . Not on file  Tobacco Use  . Smoking status: Former Smoker    Packs/day: 2.00    Years: 7.00    Pack years: 14.00    Types: Cigarettes  . Smokeless tobacco: Never Used  . Tobacco comment: 02/03/2014 "quit smoking in the 1960's"  Vaping Use  . Vaping Use: Never used  Substance and Sexual Activity  . Alcohol use: No  . Drug use: No  . Sexual activity: Never  Other Topics Concern  . Not on file  Social History Narrative  . Not on file   Social Determinants of Health   Financial Resource Strain:   . Difficulty of Paying Living Expenses:   Food Insecurity:   . Worried About Charity fundraiser in the Last Year:   . Arboriculturist in the Last Year:   Transportation Needs:   . Film/video editor (Medical):   Marland Kitchen Lack of Transportation (Non-Medical):   Physical Activity:   . Days of Exercise per Week:   . Minutes of Exercise per Session:   Stress:   . Feeling of Stress :   Social Connections:   . Frequency of Communication with Friends and Family:   . Frequency of Social Gatherings with Friends and Family:   . Attends Religious Services:   . Active Member of Clubs or Organizations:   . Attends Archivist Meetings:   Marland Kitchen Marital Status:     Tobacco Counseling Counseling given: Not Answered Comment: 02/03/2014 "quit smoking in the 1960's"   Clinical Intake:  Pre-visit preparation completed: Yes           How often do you need to have someone help you when you read instructions, pamphlets, or other written materials from your doctor or pharmacy?: 1 - Never  Interpreter Needed?: No      Activities of Daily Living In your present state of health, do you have any difficulty performing the following activities: 06/05/2020  Hearing? N  Vision? N  Difficulty concentrating or making decisions? N    Walking or climbing stairs? Y  Comment Chronic R knee pain. Brace worn.  Dressing or bathing? N  Doing errands, shopping? N  Preparing Food and eating ? Y  Comment She does not cook stove top. Microwave in use. Self feeds.  Using the Toilet? N  In the past six months, have you accidently leaked urine? N  Do you have problems with loss of bowel control? N  Managing your Medications? N  Managing your Finances? N  Housekeeping or managing your Housekeeping? Y  Comment Maid assist  Some recent data might be hidden    Patient Care Team: Crecencio Mc, MD as PCP - General (Internal  Medicine)  Indicate any recent Medical Services you may have received from other than [facility] providers in the past year (date may be approximate).     Assessment:   This is a routine wellness examination for [place].  I connected with Stefanee today by telephone and verified that I am speaking with the correct person using two identifiers. Location patient: home Location provider: work Persons participating in the virtual visit: patient, Marine scientist.    I discussed the limitations, risks, security and privacy concerns of performing an evaluation and management service by telephone and the availability of in person appointments. The patient expressed understanding and verbally consented to this telephonic visit.    Interactive audio and video telecommunications were attempted between this provider and patient, however failed, due to patient having technical difficulties OR patient did not have access to video capability.  We continued and completed visit with audio only.  Some vital signs may be absent or patient reported.   Hearing/Vision screen  Hearing Screening   125Hz  250Hz  500Hz  1000Hz  2000Hz  3000Hz  4000Hz  6000Hz  8000Hz   Right ear:           Left ear:           Comments: Patient is able to hear conversational tones without difficulty.  No issues reported.   Vision Screening Comments: Followed by  [facility] Wears corrective lenses Visual acuity not assessed, virtual visit.  They have seen their ophthalmologist in the last 12 months.    Dietary issues and exercise activities discussed:  Healthy diet Good water intake  Goals      Patient Stated   .  Follow up with Primary Care Provider (pt-stated)      Keep all scheduled maintenance appointments  Follow up as needed      Depression Screen PHQ 2/9 Scores 06/05/2020 09/22/2019 07/27/2019 06/02/2019 12/24/2018 08/10/2017 04/14/2016  PHQ - 2 Score 0 0 0 1 1 0 0  PHQ- 9 Score - - 0 - 2 5 -    Fall Risk Fall Risk  06/05/2020 05/01/2020 04/25/2020 03/09/2020 09/22/2019  Falls in the past year? 0 - 1 1 1   Comment - - - - -  Number falls in past yr: 0 - 0 1 0  Injury with Fall? - - 1 1 0  Risk for fall due to : - - - - -  Follow up Falls evaluation completed Falls evaluation completed Falls evaluation completed Falls evaluation completed Falls evaluation completed    Handrails in use when climbing stairs? Yes  Home free of loose throw rugs in walkways, pet beds, electrical cords, etc? Yes  Adequate lighting in your home to reduce risk of falls? Yes   ASSISTIVE DEVICES UTILIZED TO PREVENT FALLS:  Life alert? Yes  Use of a cane, walker or w/c? No  Grab bars in the bathroom? Yes  Shower chair or bench in shower? Yes  Elevated toilet seat or a handicapped toilet? Yes   TIMED UP AND GO:  Was the test performed? No . Virtual visit.   Cognitive Function:     6CIT Screen 06/05/2020 06/02/2019  What Year? 0 points 0 points  What month? 0 points 0 points  What time? - 0 points  Count back from 20 - 0 points  Months in reverse 0 points 0 points  Repeat phrase 0 points -   Immunizations Immunization History  Administered Date(s) Administered  . Influenza-Unspecified 09/24/2018  . Pneumococcal Conjugate-13 01/18/2014  . Pneumococcal Polysaccharide-23 01/09/2015  . Tdap  11/07/2013  . Zoster 01/19/2012  . Zoster Recombinat  (Shingrix) 08/04/2018   Health Maintenance Health Maintenance  Topic Date Due  . COVID-19 Vaccine (1) Never done  . INFLUENZA VACCINE  06/24/2020  . TETANUS/TDAP  11/08/2023  . DEXA SCAN  Completed  . PNA vac Low Risk Adult  Completed  . MAMMOGRAM  Discontinued   Dental Screening: Recommended annual dental exams for proper oral hygiene.  Community Resource Referral / Chronic Care Management: CRR required this visit?  No   CCM required this visit?  No      Plan:    Keep all routine maintenance appointments.   I have personally reviewed and noted the following in the patient's chart:   . Medical and social history . Use of alcohol, tobacco or illicit drugs  . Current medications and supplements . Functional ability and status . Nutritional status . Physical activity . Advanced directives . List of other physicians . Hospitalizations, surgeries, and ER visits in previous 12 months . Vitals . Screenings to include cognitive, depression, and falls . Referrals and appointments  In addition, I have reviewed and discussed with patient certain preventive protocols, quality metrics, and best practice recommendations. A written personalized care plan for preventive services as well as general preventive health recommendations were provided to patient via mail.     OBrien-Blaney, Konni Kesinger L, LPN   [id number]/[id number]     I have reviewed the above information and agree with above.   Deborra Medina, MD

## 2020-06-05 NOTE — Patient Instructions (Addendum)
Emily Wu , Thank you for taking time to come for your Medicare Wellness Visit. I appreciate your ongoing commitment to your health goals. Please review the following plan we discussed and let me know if I can assist you in the future.   These are the goals we discussed: Goals      Patient Stated   .  Follow up with Primary Care Provider (pt-stated)      Keep all scheduled maintenance appointments  Follow up as needed       This is a list of the screening recommended for you and due dates:  Health Maintenance  Topic Date Due  . COVID-19 Vaccine (1) Never done  . Flu Shot  06/24/2020  . Tetanus Vaccine  11/08/2023  . DEXA scan (bone density measurement)  Completed  . Pneumonia vaccines  Completed  . Mammogram  Discontinued    Keep all routine maintenance appointments.     Advanced directives: End of life planning; Advance aging; Advanced directives discussed.  Copy of current HCPOA/Living Will requested.    Next appointment: Follow up in one year for your annual wellness visit.   Preventive Care 21 Years and Older, Female Preventive care refers to lifestyle choices and visits with your health care provider that can promote health and wellness. What does preventive care include?  A yearly physical exam. This is also called an annual well check.  Dental exams once or twice a year.  Routine eye exams. Ask your health care provider how often you should have your eyes checked.  Personal lifestyle choices, including:  Daily care of your teeth and gums.  Regular physical activity.  Eating a healthy diet.  Avoiding tobacco and drug use.  Limiting alcohol use.  Practicing safe sex.  Taking low-dose aspirin every day.  Taking vitamin and mineral supplements as recommended by your health care provider. What happens during an annual well check? The services and screenings done by your health care provider during your annual well check will depend on your age, overall  health, lifestyle risk factors, and family history of disease. Counseling  Your health care provider may ask you questions about your:  Alcohol use.  Tobacco use.  Drug use.  Emotional well-being.  Home and relationship well-being.  Sexual activity.  Eating habits.  History of falls.  Memory and ability to understand (cognition).  Work and work Statistician.  Reproductive health. Screening  You may have the following tests or measurements:  Height, weight, and BMI.  Blood pressure.  Lipid and cholesterol levels. These may be checked every 5 years, or more frequently if you are over 67 years old.  Skin check.  Lung cancer screening. You may have this screening every year starting at age 30 if you have a 30-pack-year history of smoking and currently smoke or have quit within the past 15 years.  Fecal occult blood test (FOBT) of the stool. You may have this test every year starting at age 76.  Flexible sigmoidoscopy or colonoscopy. You may have a sigmoidoscopy every 5 years or a colonoscopy every 10 years starting at age 33.  Hepatitis C blood test.  Hepatitis B blood test.  Sexually transmitted disease (STD) testing.  Diabetes screening. This is done by checking your blood sugar (glucose) after you have not eaten for a while (fasting). You may have this done every 1-3 years.  Bone density scan. This is done to screen for osteoporosis. You may have this done starting at age 45.  Mammogram.  This may be done every 1-2 years. Talk to your health care provider about how often you should have regular mammograms. Talk with your health care provider about your test results, treatment options, and if necessary, the need for more tests. Vaccines  Your health care provider may recommend certain vaccines, such as:  Influenza vaccine. This is recommended every year.  Tetanus, diphtheria, and acellular pertussis (Tdap, Td) vaccine. You may need a Td booster every 10  years.  Zoster vaccine. You may need this after age 6.  Pneumococcal 13-valent conjugate (PCV13) vaccine. One dose is recommended after age 6.  Pneumococcal polysaccharide (PPSV23) vaccine. One dose is recommended after age 73. Talk to your health care provider about which screenings and vaccines you need and how often you need them. This information is not intended to replace advice given to you by your health care provider. Make sure you discuss any questions you have with your health care provider. Document Released: 12/07/2015 Document Revised: 07/30/2016 Document Reviewed: 09/11/2015 Elsevier Interactive Patient Education  2017 [employer] Prevention in the Home Falls can cause injuries. They can happen to people of all ages. There are many things you can do to make your home safe and to help prevent falls. What can I do on the outside of my home?  Regularly fix the edges of walkways and driveways and fix any cracks.  Remove anything that might make you trip as you walk through a door, such as a raised step or threshold.  Trim any bushes or trees on the path to your home.  Use bright outdoor lighting.  Clear any walking paths of anything that might make someone trip, such as rocks or tools.  Regularly check to see if handrails are loose or broken. Make sure that both sides of any steps have handrails.  Any raised decks and porches should have guardrails on the edges.  Have any leaves, snow, or ice cleared regularly.  Use sand or salt on walking paths during winter.  Clean up any spills in your garage right away. This includes oil or grease spills. What can I do in the bathroom?  Use night lights.  Install grab bars by the toilet and in the tub and shower. Do not use towel bars as grab bars.  Use non-skid mats or decals in the tub or shower.  If you need to sit down in the shower, use a plastic, non-slip stool.  Keep the floor dry. Clean up any water that  spills on the floor as soon as it happens.  Remove soap buildup in the tub or shower regularly.  Attach bath mats securely with double-sided non-slip rug tape.  Do not have throw rugs and other things on the floor that can make you trip. What can I do in the bedroom?  Use night lights.  Make sure that you have a light by your bed that is easy to reach.  Do not use any sheets or blankets that are too big for your bed. They should not hang down onto the floor.  Have a firm chair that has side arms. You can use this for support while you get dressed.  Do not have throw rugs and other things on the floor that can make you trip. What can I do in the kitchen?  Clean up any spills right away.  Avoid walking on wet floors.  Keep items that you use a lot in easy-to-reach places.  If you need to reach something  above you, use a strong step stool that has a grab bar.  Keep electrical cords out of the way.  Do not use floor polish or wax that makes floors slippery. If you must use wax, use non-skid floor wax.  Do not have throw rugs and other things on the floor that can make you trip. What can I do with my stairs?  Do not leave any items on the stairs.  Make sure that there are handrails on both sides of the stairs and use them. Fix handrails that are broken or loose. Make sure that handrails are as long as the stairways.  Check any carpeting to make sure that it is firmly attached to the stairs. Fix any carpet that is loose or worn.  Avoid having throw rugs at the top or bottom of the stairs. If you do have throw rugs, attach them to the floor with carpet tape.  Make sure that you have a light switch at the top of the stairs and the bottom of the stairs. If you do not have them, ask someone to add them for you. What else can I do to help prevent falls?  Wear shoes that:  Do not have high heels.  Have rubber bottoms.  Are comfortable and fit you well.  Are closed at the  toe. Do not wear sandals.  If you use a stepladder:  Make sure that it is fully opened. Do not climb a closed stepladder.  Make sure that both sides of the stepladder are locked into place.  Ask someone to hold it for you, if possible.  Clearly mark and make sure that you can see:  Any grab bars or handrails.  First and last steps.  Where the edge of each step is.  Use tools that help you move around (mobility aids) if they are needed. These include:  Canes.  Walkers.  Scooters.  Crutches.  Turn on the lights when you go into a dark area. Replace any light bulbs as soon as they burn out.  Set up your furniture so you have a clear path. Avoid moving your furniture around.  If any of your floors are uneven, fix them.  If there are any pets around you, be aware of where they are.  Review your medicines with your doctor. Some medicines can make you feel dizzy. This can increase your chance of falling. Ask your doctor what other things that you can do to help prevent falls. This information is not intended to replace advice given to you by your health care provider. Make sure you discuss any questions you have with your health care provider. Document Released: 09/06/2009 Document Revised: 04/17/2016 Document Reviewed: 12/15/2014 Elsevier Interactive Patient Education  2017 [employer].

## 2020-06-18 ENCOUNTER — Ambulatory Visit (INDEPENDENT_AMBULATORY_CARE_PROVIDER_SITE_OTHER): Payer: Medicare Other | Admitting: Internal Medicine

## 2020-06-18 ENCOUNTER — Other Ambulatory Visit: Payer: Self-pay | Admitting: Internal Medicine

## 2020-06-18 ENCOUNTER — Other Ambulatory Visit: Payer: Self-pay

## 2020-06-18 ENCOUNTER — Ambulatory Visit (HOSPITAL_COMMUNITY): Payer: Medicare Other | Attending: Internal Medicine

## 2020-06-18 VITALS — BP 118/64 | HR 75 | Temp 98.2°F | Ht 62.52 in | Wt 122.2 lb

## 2020-06-18 DIAGNOSIS — M25512 Pain in left shoulder: Secondary | ICD-10-CM

## 2020-06-18 DIAGNOSIS — R232 Flushing: Secondary | ICD-10-CM | POA: Diagnosis not present

## 2020-06-18 DIAGNOSIS — E7800 Pure hypercholesterolemia, unspecified: Secondary | ICD-10-CM

## 2020-06-18 DIAGNOSIS — R42 Dizziness and giddiness: Secondary | ICD-10-CM | POA: Diagnosis not present

## 2020-06-18 DIAGNOSIS — N951 Menopausal and female climacteric states: Secondary | ICD-10-CM

## 2020-06-18 DIAGNOSIS — M19012 Primary osteoarthritis, left shoulder: Secondary | ICD-10-CM | POA: Diagnosis not present

## 2020-06-18 DIAGNOSIS — R0602 Shortness of breath: Secondary | ICD-10-CM | POA: Diagnosis not present

## 2020-06-18 DIAGNOSIS — J90 Pleural effusion, not elsewhere classified: Secondary | ICD-10-CM | POA: Diagnosis not present

## 2020-06-18 LAB — CBC WITH DIFFERENTIAL/PLATELET
Basophils Absolute: 0.1 10*3/uL (ref 0.0–0.1)
Basophils Relative: 0.8 % (ref 0.0–3.0)
Eosinophils Absolute: 0.1 10*3/uL (ref 0.0–0.7)
Eosinophils Relative: 0.7 % (ref 0.0–5.0)
HCT: 37.7 % (ref 36.0–46.0)
Hemoglobin: 12.6 g/dL (ref 12.0–15.0)
Lymphocytes Relative: 25.3 % (ref 12.0–46.0)
Lymphs Abs: 2.2 10*3/uL (ref 0.7–4.0)
MCHC: 33.5 g/dL (ref 30.0–36.0)
MCV: 92.6 fl (ref 78.0–100.0)
Monocytes Absolute: 0.9 10*3/uL (ref 0.1–1.0)
Monocytes Relative: 11 % (ref 3.0–12.0)
Neutro Abs: 5.3 10*3/uL (ref 1.4–7.7)
Neutrophils Relative %: 62.2 % (ref 43.0–77.0)
Platelets: 342 10*3/uL (ref 150.0–400.0)
RBC: 4.07 Mil/uL (ref 3.87–5.11)
RDW: 14.6 % (ref 11.5–15.5)
WBC: 8.6 10*3/uL (ref 4.0–10.5)

## 2020-06-18 LAB — SEDIMENTATION RATE: Sed Rate: 38 mm/hr — ABNORMAL HIGH (ref 0–30)

## 2020-06-18 NOTE — Patient Instructions (Addendum)
You need to add a grab bar next to the commode to help steady yourself   PT referral for the shoulder pain    X rays of shoulder and chest today    You can add up to 2000 mg of acetominophen (tylenol) every day safely  In divided doses (500 mg every 6 hours,   650 mg every 8 hours,   Or 1000 mg every 12 hours.)
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
## 2020-06-19 DIAGNOSIS — M25512 Pain in left shoulder: Secondary | ICD-10-CM | POA: Insufficient documentation

## 2020-06-19 NOTE — Assessment & Plan Note (Signed)
She is not orthostatic by measurements today but continues to have symptoms of dizziness upon rising. Recommended adding a grab bar at the commode to help prevent subsequent  post micturition falls,  And PT evaluation for dizzines and shoulder pain   Lab Results  Component Value Date   VITAMINB12 1,109 (H) 03/09/2020   Lab Results  Component Value Date   FOLATE >24.1 08/18/2019   Lab Results  Component Value Date   WBC 8.6 06/18/2020   HGB 12.6 06/18/2020   HCT 37.7 06/18/2020   MCV 92.6 06/18/2020   PLT 342.0 06/18/2020

## 2020-06-19 NOTE — Assessment & Plan Note (Addendum)
Managed with transdermal HRT.  Given the drop in 02 sat,  Repeating a chest film today

## 2020-06-19 NOTE — Assessment & Plan Note (Signed)
Secondary to recent fall.  Passive ROM not painful,  Reassuring exam.  Plain films to evaluate for bone spurring

## 2020-06-19 NOTE — Assessment & Plan Note (Signed)
Well controlled on current statin therapy by prior fasting labs.  Liver enzymes were normal,  no changes today.  Lab Results  Component Value Date   CHOL 161 08/18/2019   HDL 41.00 08/18/2019   LDLCALC 73 03/19/2018   LDLDIRECT 90.0 08/18/2019   TRIG 206.0 (H) 08/18/2019   CHOLHDL 4 08/18/2019     Lab Results  Component Value Date   ALT 16 03/09/2020   AST 22 03/09/2020   ALKPHOS 68 03/09/2020   BILITOT 0.3 03/09/2020  \

## 2020-07-03 ENCOUNTER — Other Ambulatory Visit: Payer: Self-pay | Admitting: Internal Medicine

## 2020-07-09 DIAGNOSIS — H2511 Age-related nuclear cataract, right eye: Secondary | ICD-10-CM | POA: Diagnosis not present

## 2020-07-09 DIAGNOSIS — E7800 Pure hypercholesterolemia, unspecified: Secondary | ICD-10-CM | POA: Diagnosis not present

## 2020-07-16 ENCOUNTER — Emergency Department: Payer: Medicare Other

## 2020-07-16 ENCOUNTER — Other Ambulatory Visit: Payer: Self-pay

## 2020-07-16 ENCOUNTER — Inpatient Hospital Stay
Admission: EM | Admit: 2020-07-16 | Discharge: 2020-07-28 | DRG: 177 | Disposition: A | Discharge status: Home Health | Payer: Medicare Other | Attending: Internal Medicine | Admitting: Internal Medicine

## 2020-07-16 DIAGNOSIS — Z6820 Body mass index (BMI) 20.0-20.9, adult: Secondary | ICD-10-CM

## 2020-07-16 DIAGNOSIS — I959 Hypotension, unspecified: Secondary | ICD-10-CM | POA: Diagnosis not present

## 2020-07-16 DIAGNOSIS — Z9189 Other specified personal risk factors, not elsewhere classified: Secondary | ICD-10-CM

## 2020-07-16 DIAGNOSIS — E7800 Pure hypercholesterolemia, unspecified: Secondary | ICD-10-CM | POA: Diagnosis present

## 2020-07-16 DIAGNOSIS — Z90710 Acquired absence of both cervix and uterus: Secondary | ICD-10-CM

## 2020-07-16 DIAGNOSIS — Z79899 Other long term (current) drug therapy: Secondary | ICD-10-CM

## 2020-07-16 DIAGNOSIS — U071 COVID-19: Principal | ICD-10-CM | POA: Diagnosis present

## 2020-07-16 DIAGNOSIS — F329 Major depressive disorder, single episode, unspecified: Secondary | ICD-10-CM | POA: Diagnosis present

## 2020-07-16 DIAGNOSIS — Z96652 Presence of left artificial knee joint: Secondary | ICD-10-CM | POA: Diagnosis present

## 2020-07-16 DIAGNOSIS — R531 Weakness: Secondary | ICD-10-CM

## 2020-07-16 DIAGNOSIS — R609 Edema, unspecified: Secondary | ICD-10-CM

## 2020-07-16 DIAGNOSIS — R109 Unspecified abdominal pain: Secondary | ICD-10-CM

## 2020-07-16 DIAGNOSIS — E538 Deficiency of other specified B group vitamins: Secondary | ICD-10-CM | POA: Diagnosis present

## 2020-07-16 DIAGNOSIS — Z23 Encounter for immunization: Secondary | ICD-10-CM | POA: Diagnosis not present

## 2020-07-16 DIAGNOSIS — E43 Unspecified severe protein-calorie malnutrition: Secondary | ICD-10-CM | POA: Diagnosis present

## 2020-07-16 DIAGNOSIS — R197 Diarrhea, unspecified: Secondary | ICD-10-CM | POA: Diagnosis not present

## 2020-07-16 DIAGNOSIS — E039 Hypothyroidism, unspecified: Secondary | ICD-10-CM | POA: Diagnosis present

## 2020-07-16 DIAGNOSIS — Z209 Contact with and (suspected) exposure to unspecified communicable disease: Secondary | ICD-10-CM | POA: Diagnosis not present

## 2020-07-16 DIAGNOSIS — K219 Gastro-esophageal reflux disease without esophagitis: Secondary | ICD-10-CM | POA: Diagnosis present

## 2020-07-16 DIAGNOSIS — R627 Adult failure to thrive: Secondary | ICD-10-CM | POA: Diagnosis present

## 2020-07-16 DIAGNOSIS — E876 Hypokalemia: Secondary | ICD-10-CM | POA: Diagnosis not present

## 2020-07-16 DIAGNOSIS — I10 Essential (primary) hypertension: Secondary | ICD-10-CM | POA: Diagnosis not present

## 2020-07-16 DIAGNOSIS — J90 Pleural effusion, not elsewhere classified: Secondary | ICD-10-CM | POA: Diagnosis not present

## 2020-07-16 DIAGNOSIS — J189 Pneumonia, unspecified organism: Secondary | ICD-10-CM | POA: Diagnosis not present

## 2020-07-16 DIAGNOSIS — R911 Solitary pulmonary nodule: Secondary | ICD-10-CM | POA: Diagnosis present

## 2020-07-16 DIAGNOSIS — E785 Hyperlipidemia, unspecified: Secondary | ICD-10-CM | POA: Diagnosis present

## 2020-07-16 DIAGNOSIS — K5900 Constipation, unspecified: Secondary | ICD-10-CM

## 2020-07-16 DIAGNOSIS — Z8541 Personal history of malignant neoplasm of cervix uteri: Secondary | ICD-10-CM

## 2020-07-16 DIAGNOSIS — Z66 Do not resuscitate: Secondary | ICD-10-CM | POA: Diagnosis not present

## 2020-07-16 DIAGNOSIS — Z87891 Personal history of nicotine dependence: Secondary | ICD-10-CM

## 2020-07-16 DIAGNOSIS — F32A Depression, unspecified: Secondary | ICD-10-CM | POA: Diagnosis present

## 2020-07-16 DIAGNOSIS — G9341 Metabolic encephalopathy: Secondary | ICD-10-CM | POA: Diagnosis present

## 2020-07-16 LAB — COMPREHENSIVE METABOLIC PANEL
ALT: 22 U/L (ref 0–44)
AST: 37 U/L (ref 15–41)
Albumin: 3.7 g/dL (ref 3.5–5.0)
Alkaline Phosphatase: 64 U/L (ref 38–126)
Anion gap: 14 (ref 5–15)
BUN: 13 mg/dL (ref 8–23)
CO2: 24 mmol/L (ref 22–32)
Calcium: 8.6 mg/dL — ABNORMAL LOW (ref 8.9–10.3)
Chloride: 98 mmol/L (ref 98–111)
Creatinine, Ser: 0.99 mg/dL (ref 0.44–1.00)
GFR calc Af Amer: 59 mL/min — ABNORMAL LOW (ref >60)
GFR calc non Af Amer: 51 mL/min — ABNORMAL LOW (ref >60)
Glucose, Bld: 107 mg/dL — ABNORMAL HIGH (ref 70–99)
Potassium: 3.3 mmol/L — ABNORMAL LOW (ref 3.5–5.1)
Sodium: 136 mmol/L (ref 135–145)
Total Bilirubin: 0.6 mg/dL (ref 0.3–1.2)
Total Protein: 7 g/dL (ref 6.5–8.1)

## 2020-07-16 LAB — URINALYSIS, COMPLETE (UACMP) WITH MICROSCOPIC
Bilirubin Urine: NEGATIVE
Glucose, UA: NEGATIVE mg/dL
Ketones, ur: NEGATIVE mg/dL
Nitrite: NEGATIVE
Protein, ur: 30 mg/dL — AB
Specific Gravity, Urine: 1.016 (ref 1.005–1.030)
pH: 5 (ref 5.0–8.0)

## 2020-07-16 LAB — CBC
HCT: 43.5 % (ref 36.0–46.0)
Hemoglobin: 14.2 g/dL (ref 12.0–15.0)
MCH: 30.3 pg (ref 26.0–34.0)
MCHC: 32.6 g/dL (ref 30.0–36.0)
MCV: 92.9 fL (ref 80.0–100.0)
Platelets: 236 10*3/uL (ref 150–400)
RBC: 4.68 MIL/uL (ref 3.87–5.11)
RDW: 14 % (ref 11.5–15.5)
WBC: 6.2 10*3/uL (ref 4.0–10.5)
nRBC: 0 % (ref 0.0–0.2)

## 2020-07-16 LAB — ABO/RH: ABO/RH(D): A POS

## 2020-07-16 LAB — LIPASE, BLOOD: Lipase: 26 U/L (ref 11–51)

## 2020-07-16 LAB — SARS CORONAVIRUS 2 BY RT PCR (HOSPITAL ORDER, PERFORMED IN ~~LOC~~ HOSPITAL LAB): SARS Coronavirus 2: POSITIVE — AB

## 2020-07-16 MED ORDER — ASPIRIN EC 81 MG PO TBEC
81.0000 mg | DELAYED_RELEASE_TABLET | Freq: Every day | ORAL | Status: DC
  Administered 2020-07-17 – 2020-07-28 (×12): 81 mg via ORAL
  Filled 2020-07-16 (×11): qty 1

## 2020-07-16 MED ORDER — VITAMIN B-12 1000 MCG PO TABS
1000.0000 ug | ORAL_TABLET | Freq: Every day | ORAL | Status: DC
  Administered 2020-07-17 – 2020-07-28 (×12): 1000 ug via ORAL
  Filled 2020-07-16 (×13): qty 1

## 2020-07-16 MED ORDER — FAMOTIDINE 20 MG PO TABS
20.0000 mg | ORAL_TABLET | Freq: Two times a day (BID) | ORAL | Status: DC
  Administered 2020-07-16 – 2020-07-20 (×8): 20 mg via ORAL
  Filled 2020-07-16 (×7): qty 1

## 2020-07-16 MED ORDER — ATORVASTATIN CALCIUM 20 MG PO TABS
20.0000 mg | ORAL_TABLET | Freq: Every day | ORAL | Status: DC
  Administered 2020-07-17 – 2020-07-27 (×11): 20 mg via ORAL
  Filled 2020-07-16 (×12): qty 1

## 2020-07-16 MED ORDER — DIPHENHYDRAMINE HCL 50 MG/ML IJ SOLN: 50.0000 mg | Freq: Once | INTRAMUSCULAR | Status: DC | PRN

## 2020-07-16 MED ORDER — ACETAMINOPHEN 325 MG PO TABS
650.0000 mg | ORAL_TABLET | Freq: Four times a day (QID) | ORAL | Status: DC | PRN
  Administered 2020-07-17 – 2020-07-27 (×14): 650 mg via ORAL
  Filled 2020-07-16 (×13): qty 2

## 2020-07-16 MED ORDER — SENNOSIDES-DOCUSATE SODIUM 8.6-50 MG PO TABS: 2.0000 | ORAL_TABLET | Freq: Every day | ORAL | Status: DC

## 2020-07-16 MED ORDER — ESTRADIOL 0.05 MG/24HR TD PTWK: 0.1000 mg | MEDICATED_PATCH | TRANSDERMAL | Status: DC

## 2020-07-16 MED ORDER — SODIUM CHLORIDE 0.9 % IV SOLN
200.0000 mg | Freq: Once | INTRAVENOUS | Status: AC
  Administered 2020-07-17: 200 mg via INTRAVENOUS
  Filled 2020-07-16: qty 200

## 2020-07-16 MED ORDER — SODIUM CHLORIDE 0.9 % IV SOLN
1200.0000 mg | Freq: Once | INTRAVENOUS | Status: AC
  Administered 2020-07-16: 1200 mg via INTRAVENOUS
  Filled 2020-07-16: qty 10

## 2020-07-16 MED ORDER — TRAZODONE HCL 50 MG PO TABS
25.0000 mg | ORAL_TABLET | Freq: Every evening | ORAL | Status: DC | PRN
  Administered 2020-07-16 – 2020-07-27 (×6): 25 mg via ORAL
  Filled 2020-07-16 (×8): qty 1

## 2020-07-16 MED ORDER — MAGNESIUM HYDROXIDE 400 MG/5ML PO SUSP
30.0000 mL | Freq: Every day | ORAL | Status: DC | PRN
  Filled 2020-07-16: qty 30

## 2020-07-16 MED ORDER — ONDANSETRON HCL 4 MG PO TABS: 4.0000 mg | ORAL_TABLET | Freq: Four times a day (QID) | ORAL | Status: DC | PRN

## 2020-07-16 MED ORDER — ACETAMINOPHEN 325 MG PO TABS
650.0000 mg | ORAL_TABLET | Freq: Once | ORAL | Status: AC
  Administered 2020-07-16: 650 mg via ORAL
  Filled 2020-07-16: qty 2

## 2020-07-16 MED ORDER — ADULT MULTIVITAMIN W/MINERALS CH
1.0000 | ORAL_TABLET | Freq: Every day | ORAL | Status: DC
  Administered 2020-07-17 – 2020-07-28 (×12): 1 via ORAL
  Filled 2020-07-16 (×12): qty 1

## 2020-07-16 MED ORDER — LIOTHYRONINE SODIUM 5 MCG PO TABS
10.0000 ug | ORAL_TABLET | Freq: Every day | ORAL | Status: DC
  Administered 2020-07-17 – 2020-07-28 (×12): 10 ug via ORAL
  Filled 2020-07-16 (×14): qty 2

## 2020-07-16 MED ORDER — GUAIFENESIN ER 600 MG PO TB12
600.0000 mg | ORAL_TABLET | Freq: Two times a day (BID) | ORAL | Status: DC
  Administered 2020-07-16 – 2020-07-28 (×24): 600 mg via ORAL
  Filled 2020-07-16 (×25): qty 1

## 2020-07-16 MED ORDER — LACTATED RINGERS IV BOLUS
1000.0000 mL | Freq: Once | INTRAVENOUS | Status: AC
  Administered 2020-07-16: 1000 mL via INTRAVENOUS

## 2020-07-16 MED ORDER — FAMOTIDINE IN NACL 20-0.9 MG/50ML-% IV SOLN: 20.0000 mg | Freq: Once | INTRAVENOUS | Status: DC | PRN

## 2020-07-16 MED ORDER — SODIUM CHLORIDE 0.9 % IV SOLN
100.0000 mg | Freq: Every day | INTRAVENOUS | Status: AC
  Administered 2020-07-17 – 2020-07-20 (×4): 100 mg via INTRAVENOUS
  Filled 2020-07-16: qty 100
  Filled 2020-07-16 (×3): qty 20

## 2020-07-16 MED ORDER — SODIUM CHLORIDE 0.9 % IV SOLN: 200.0000 mg | Freq: Once | INTRAVENOUS | Status: DC

## 2020-07-16 MED ORDER — HYDROCOD POLST-CPM POLST ER 10-8 MG/5ML PO SUER
5.0000 mL | Freq: Two times a day (BID) | ORAL | Status: DC | PRN
  Administered 2020-07-17 – 2020-07-19 (×2): 5 mL via ORAL
  Filled 2020-07-16 (×3): qty 5

## 2020-07-16 MED ORDER — EPINEPHRINE 0.3 MG/0.3ML IJ SOAJ
0.3000 mg | Freq: Once | INTRAMUSCULAR | Status: DC | PRN
  Filled 2020-07-16: qty 0.3

## 2020-07-16 MED ORDER — POTASSIUM CHLORIDE CRYS ER 20 MEQ PO TBCR
20.0000 meq | EXTENDED_RELEASE_TABLET | Freq: Every day | ORAL | Status: DC
  Administered 2020-07-16 – 2020-07-18 (×3): 20 meq via ORAL
  Filled 2020-07-16 (×3): qty 1

## 2020-07-16 MED ORDER — SODIUM CHLORIDE 0.9 % IV SOLN: INTRAVENOUS | Status: DC | PRN

## 2020-07-16 MED ORDER — SODIUM CHLORIDE 0.9 % IV SOLN: 100.0000 mg | Freq: Every day | INTRAVENOUS | Status: DC

## 2020-07-16 MED ORDER — POLYETHYLENE GLYCOL 3350 17 G PO PACK: 17.0000 g | PACK | Freq: Every day | ORAL | Status: DC | PRN

## 2020-07-16 MED ORDER — ONDANSETRON HCL 4 MG/2ML IJ SOLN
4.0000 mg | Freq: Four times a day (QID) | INTRAMUSCULAR | Status: DC | PRN
  Administered 2020-07-20: 19:00:00 4 mg via INTRAVENOUS
  Filled 2020-07-16: qty 2

## 2020-07-16 MED ORDER — VITAMIN D 25 MCG (1000 UNIT) PO TABS
1000.0000 [IU] | ORAL_TABLET | Freq: Every day | ORAL | Status: DC
  Administered 2020-07-17 – 2020-07-28 (×12): 1000 [IU] via ORAL
  Filled 2020-07-16 (×12): qty 1

## 2020-07-16 MED ORDER — ALBUTEROL SULFATE HFA 108 (90 BASE) MCG/ACT IN AERS
2.0000 | INHALATION_SPRAY | Freq: Once | RESPIRATORY_TRACT | Status: DC | PRN
  Filled 2020-07-16: qty 6.7

## 2020-07-16 MED ORDER — GUAIFENESIN-DM 100-10 MG/5ML PO SYRP
10.0000 mL | ORAL_SOLUTION | ORAL | Status: DC | PRN
  Filled 2020-07-16: qty 10

## 2020-07-16 MED ORDER — METHYLPREDNISOLONE SODIUM SUCC 125 MG IJ SOLR: 125.0000 mg | Freq: Once | INTRAMUSCULAR | Status: DC | PRN

## 2020-07-16 MED ORDER — ENOXAPARIN SODIUM 40 MG/0.4ML ~~LOC~~ SOLN
40.0000 mg | SUBCUTANEOUS | Status: DC
  Administered 2020-07-16 – 2020-07-27 (×12): 40 mg via SUBCUTANEOUS
  Filled 2020-07-16 (×10): qty 0.4

## 2020-07-16 MED ORDER — ASCORBIC ACID 500 MG PO TABS
500.0000 mg | ORAL_TABLET | Freq: Every day | ORAL | Status: DC
  Administered 2020-07-17 – 2020-07-28 (×12): 500 mg via ORAL
  Filled 2020-07-16 (×12): qty 1

## 2020-07-16 MED ORDER — ZINC SULFATE 220 (50 ZN) MG PO CAPS
220.0000 mg | ORAL_CAPSULE | Freq: Every day | ORAL | Status: DC
  Administered 2020-07-17 – 2020-07-28 (×12): 220 mg via ORAL
  Filled 2020-07-16 (×12): qty 1

## 2020-07-16 NOTE — ED Notes (Addendum)
Called pt's daughter [name] to inform her pt was ready to be discharged home. Pt's daughter voiced concerns about patient's weakness stating she could not walk to the phone to call 911 which is not normal for her. Daughter reports patient unable to get up and ambulate to the bathroom and she does not feel as if the pt is safe to go home alone and they are unable to care for her at this time. Dr. Ellender Hose informed

## 2020-07-16 NOTE — ED Provider Notes (Addendum)
[facility] Emergency Department Provider Note    First MD Initiated Contact with Patient 07/16/20 1324     (approximate)  I have reviewed the triage vital signs and the nursing notes.   HISTORY  Chief Complaint Diarrhea and Generalized Body Aches    HPI Emily Wu is a 86 y.o. female below listed past medical history presents to the ER for evaluation of cough and concern for recent COVID-19 exposure.  And states that she was recently treated for C. difficile but denies any diarrhea or abdominal pain.  No nausea or vomiting.  Denies any measured fevers or chills.  States that she has had some muscle aches.  Primarily concerned about getting this Covid test because she has outpatient cataract surgery this week.    Past Medical History:  Diagnosis Date  . Arthritis    "knees, shoulders, chest" (02/03/2014)  . Bruises easily   . Cervical cancer ([place]) 1965  . High cholesterol   . History of kidney stones   . Hypertension    DENIES   . Tendency toward bleeding easily [facility])    Family History  Problem Relation Age of Onset  . Peripheral vascular disease Mother   . Stroke Father   . Hyperlipidemia Father   . Heart disease Father 75  . Cancer Neg Hx    Past Surgical History:  Procedure Laterality Date  . ABDOMINAL HYSTERECTOMY  1965  . APPENDECTOMY    . CHOLECYSTECTOMY    . KNEE ARTHROSCOPY Left    [facility]  . PARTIAL KNEE ARTHROPLASTY Left 01/26/2019   Procedure: UNICOMPARTMENTAL KNEE;  Surgeon: Marchia Bond, MD;  Location: [place];  Service: Orthopedics;  Laterality: Left;  . SHOULDER OPEN ROTATOR CUFF REPAIR Bilateral 2007,  2010   [facility]  . THROAT SURGERY  2014   "arthritis hugh knot" (02/03/2014)   Patient Active Problem List   Diagnosis Date Noted  . Acute pain of left shoulder 06/19/2020  . Abnormal posture 04/26/2020  . Concussion 03/11/2020  . Widowed 03/11/2020  . Osteoarthritis of right knee 08/26/2019  . Orthostatic dizziness  08/18/2019  . S/P left unicompartmental knee replacement 01/26/2019  . Hospital discharge follow-up 12/26/2018  . Incidental pulmonary nodule, > 26mm and < 58mm 12/26/2018  . Chronic kidney disease (CKD), stage III (moderate) 12/26/2018  . Left bundle branch block (LBBB) determined by electrocardiography 12/02/2018  . GERD (gastroesophageal reflux disease) 09/29/2018  . Anxiety disorder 03/25/2018  . Chronic pain of left knee 05/27/2016  . Left carotid bruit 04/14/2016  . Menopausal hot flushes 08/07/2015  . H/O malignant neoplasm of skin 06/25/2015  . Hypothyroidism 01/31/2015  . History of cervical cancer 01/10/2015  . Hyperlipidemia 02/03/2014  . Medicare annual wellness visit, subsequent 01/19/2014  . History of IBS 01/19/2014  . S/P breast augmentation 01/19/2014  . Primary localized osteoarthritis of left knee 07/06/2012  . Colon polyps 01/30/2012  . Low back pain 01/30/2012      Prior to Admission medications   Medication Sig Start Date End Date Taking? Authorizing Provider  acetaminophen (TYLENOL) 650 MG CR tablet Take 650 mg by mouth every 8 (eight) hours as needed for pain.     [provider]  amitriptyline (ELAVIL) 25 MG tablet TAKE ONE TABLET AT BEDTIME NEED APP FOR NEXT REFILL 07/03/20   Crecencio Mc, MD  atorvastatin (LIPITOR) 20 MG tablet TAKE ONE TABLET EVERY DAY 02/29/20   Crecencio Mc, MD  diclofenac sodium (VOLTAREN) 1 % GEL Apply  2 g topically 4 (four) times daily as needed (for knee pain). 12/24/18   Crecencio Mc, MD  DOTTI 0.1 MG/24HR patch CHANGE PATCH TWICE WEEKLY 05/29/20   Crecencio Mc, MD  liothyronine (CYTOMEL) 5 MCG tablet TAKE 2 TABLETS BY MOUTH DAILY 06/18/20   Crecencio Mc, MD  Multiple Vitamins-Minerals (CENTRUM SILVER PO) Take 1 tablet by mouth daily.    [provider]  polyethylene glycol (MIRALAX / GLYCOLAX) packet Take 17 g by mouth daily as needed for moderate constipation.     [provider]  potassium  chloride SA (KLOR-CON) 20 MEQ tablet Take 1 tablet (20 mEq total) by mouth daily. 03/11/20   Crecencio Mc, MD  protein supplement (RESOURCE BENEPROTEIN) POWD Take 1 scoop by mouth daily as needed (constipation).     [provider]  sennosides-docusate sodium (SENOKOT-S) 8.6-50 MG tablet Take 2 tablets by mouth daily. 01/26/19   Marchia Bond, MD  vitamin B-12 (CYANOCOBALAMIN) 1000 MCG tablet Take 1,000 mcg by mouth daily.     [provider]    Allergies Tramadol, Amoxicillin, Promethazine hcl, and Sulfa drugs cross reactors    Social History Social History   Tobacco Use  . Smoking status: Former Smoker    Packs/day: 2.00    Years: 7.00    Pack years: 14.00    Types: Cigarettes  . Smokeless tobacco: Never Used  . Tobacco comment: 02/03/2014 "quit smoking in the 1960's"  Vaping Use  . Vaping Use: Never used  Substance Use Topics  . Alcohol use: No  . Drug use: No    Review of Systems Patient denies headaches, rhinorrhea, blurry vision, numbness, shortness of breath, chest pain, edema, cough, abdominal pain, nausea, vomiting, diarrhea, dysuria, fevers, rashes or hallucinations unless otherwise stated above in HPI. ____________________________________________   PHYSICAL EXAM:  VITAL SIGNS: Vitals:   07/16/20 1312 07/16/20 1330  BP:  (!) 124/59  Pulse: 63 65  Resp:    Temp:    SpO2: 100% 99%    Constitutional: Alert and oriented.  Eyes: Conjunctivae are normal.  Head: Atraumatic. Nose: No congestion/rhinnorhea. Mouth/Throat: Mucous membranes are moist.   Neck: No stridor. Painless ROM.  Cardiovascular: Normal rate, regular rhythm. Grossly normal heart sounds.  Good peripheral circulation. Respiratory: Normal respiratory effort.  No retractions. Lungs CTAB. Gastrointestinal: Soft and nontender. No distention. No abdominal bruits. No CVA tenderness. Genitourinary:  Musculoskeletal: No lower extremity tenderness nor edema.  No joint  effusions. Neurologic:  Normal speech and language. No gross focal neurologic deficits are appreciated. No facial droop Skin:  Skin is warm, dry and intact. No rash noted. Psychiatric: Mood and affect are normal. Speech and behavior are normal.  ____________________________________________   LABS (all labs ordered are listed, but only abnormal results are displayed)  Results for orders placed or performed during the hospital encounter of 07/16/20 (from the past 24 hour(s))  Lipase, blood     Status: None   Collection Time: 07/16/20  8:15 AM  Result Value Ref Range   Lipase 26 11 - 51 U/L  Comprehensive metabolic panel     Status: Abnormal   Collection Time: 07/16/20  8:15 AM  Result Value Ref Range   Sodium 136 135 - 145 mmol/L   Potassium 3.3 (L) 3.5 - 5.1 mmol/L   Chloride 98 98 - 111 mmol/L   CO2 24 22 - 32 mmol/L   Glucose, Bld 107 (H) 70 - 99 mg/dL   BUN 13 8 - 23 mg/dL  Creatinine, Ser 0.99 0.44 - 1.00 mg/dL   Calcium 8.6 (L) 8.9 - 10.3 mg/dL   Total Protein 7.0 6.5 - 8.1 g/dL   Albumin 3.7 3.5 - 5.0 g/dL   AST 37 15 - 41 U/L   ALT 22 0 - 44 U/L   Alkaline Phosphatase 64 38 - 126 U/L   Total Bilirubin 0.6 0.3 - 1.2 mg/dL   GFR calc non Af Amer 51 (L) >60 mL/min   GFR calc Af Amer 59 (L) >60 mL/min   Anion gap 14 5 - 15  CBC     Status: None   Collection Time: 07/16/20  8:15 AM  Result Value Ref Range   WBC 6.2 4.0 - 10.5 K/uL   RBC 4.68 3.87 - 5.11 MIL/uL   Hemoglobin 14.2 12.0 - 15.0 g/dL   HCT 43.5 36 - 46 %   MCV 92.9 80.0 - 100.0 fL   MCH 30.3 26.0 - 34.0 pg   MCHC 32.6 30.0 - 36.0 g/dL   RDW 14.0 11.5 - 15.5 %   Platelets 236 150 - 400 K/uL   nRBC 0.0 0.0 - 0.2 %  Urinalysis, Complete w Microscopic Urine, Clean Catch     Status: Abnormal   Collection Time: 07/16/20  8:28 AM  Result Value Ref Range   Color, Urine YELLOW (A) YELLOW   APPearance CLOUDY (A) CLEAR   Specific Gravity, Urine 1.016 1.005 - 1.030   pH 5.0 5.0 - 8.0   Glucose, UA NEGATIVE  NEGATIVE mg/dL   Hgb urine dipstick SMALL (A) NEGATIVE   Bilirubin Urine NEGATIVE NEGATIVE   Ketones, ur NEGATIVE NEGATIVE mg/dL   Protein, ur 30 (A) NEGATIVE mg/dL   Nitrite NEGATIVE NEGATIVE   Leukocytes,Ua TRACE (A) NEGATIVE   RBC / HPF 0-5 0 - 5 RBC/hpf   WBC, UA 0-5 0 - 5 WBC/hpf   Bacteria, UA RARE (A) NONE SEEN   Squamous Epithelial / LPF 11-20 0 - 5   Mucus PRESENT   SARS Coronavirus 2 by RT PCR (hospital order, performed in [place] hospital lab) Nasopharyngeal Nasopharyngeal Swab     Status: Abnormal   Collection Time: 07/16/20  1:16 PM   Specimen: Nasopharyngeal Swab  Result Value Ref Range   SARS Coronavirus 2 POSITIVE (A) NEGATIVE   ____________________________________________  EKG My review and personal interpretation at Time: 8:15   Indication: covid 19 exposure  Rate: 60  Rhythm: sinus Axis: normal Other: normal intervals, no stmei ____________________________________________  RADIOLOGY  I personally reviewed all radiographic images ordered to evaluate for the above acute complaints and reviewed radiology reports and findings.  These findings were personally discussed with the patient.  Please see medical record for radiology report.  ____________________________________________   PROCEDURES  Procedure(s) performed:  Procedures    Critical Care performed: no ____________________________________________   INITIAL IMPRESSION / ASSESSMENT AND PLAN / ED COURSE  Pertinent labs & imaging results that were available during my care of the patient were reviewed by me and considered in my medical decision making (see chart for details).   DDX: uri, dehydration, colitis, sepsis, pna, chf  SEPHORA BOYAR is a 86 y.o. who presents to the ED with symptoms as described above.  Patient clinically nontoxic-appearing.  Does not seem to be having any signs or symptoms of C. difficile.  Abdominal exam is soft and benign.  It seems that the reason for today's visit  is primarily to get Covid test.  She is not hypoxic.  Blood  work otherwise reassuring.  Clinical Course as of Jul 17 1607  Mon Jul 16, 2020  1540 Patient's Covid test is positive.  Chest x-ray without any infiltrate.  Fortunately is not hypoxic and is minimally symptomatic.  She is not having any diarrheal illness.  She is tolerating p.o.  At this point I do believe she be appropriate for outpatient referral for the infusion center.  I have called to give referral.  We discussed signs and symptoms for which the patient should return.  Discussed strict return precautions.   [PR]  1558 There is a possibility that we could facilitate the monoclonal antibody infusion here while she is in the ER.  Given her age and difficulty leaving the house that would be an ideal option for her.  If this is possible we will place those orders.  If not she still be a good candidate for outpatient infusion therapy.   [PR]    Clinical Course User Index [PR] Merlyn Lot, MD    The patient was evaluated in Emergency Department today for the symptoms described in the history of present illness. He/she was evaluated in the context of the global COVID-19 pandemic, which necessitated consideration that the patient might be at risk for infection with the SARS-CoV-2 virus that causes COVID-19. Institutional protocols and algorithms that pertain to the evaluation of patients at risk for COVID-19 are in a state of rapid change based on information released by regulatory bodies including the CDC and federal and state organizations. These policies and algorithms were followed during the patient's care in the ED.  As part of my medical decision making, I reviewed the following data within the [place] notes reviewed and incorporated, Labs reviewed, notes from prior ED visits and [place] Controlled Substance Database   ____________________________________________   FINAL CLINICAL IMPRESSION(S) / ED  DIAGNOSES  Final diagnoses:  COVID-19 virus detected      NEW MEDICATIONS STARTED DURING THIS VISIT:  New Prescriptions   No medications on file     Note:  This document was prepared using Dragon voice recognition software and may include unintentional dictation errors.    Merlyn Lot, MD 07/16/20 1541    Merlyn Lot, MD 07/16/20 1603    Merlyn Lot, MD 07/16/20 [phone]

## 2020-07-16 NOTE — ED Notes (Signed)
Pt given ginger ale.

## 2020-07-16 NOTE — H&P (Addendum)
[place]   PATIENT NAME: Emily Wu    MR#:  [id number]  DATE OF BIRTH:  07-31-1931  DATE OF ADMISSION:  07/16/2020  PRIMARY CARE PHYSICIAN: Crecencio Mc, MD   REQUESTING/REFERRING PHYSICIAN: Hulan Saas, MD/[place], PA-C CHIEF COMPLAINT:   Chief Complaint  Patient presents with  . Diarrhea  . Generalized Body Aches    HISTORY OF PRESENT ILLNESS:  Emily Wu  is a 86 y.o. Caucasian female with a known history of hypertension, dyslipidemia and osteoarthritis, who presented to the emergency room with acute onset of generalized body aches and significant weakness with associated recurrent diarrhea without nausea or vomiting.  She admits to mild rhinorrhea and nasal congestion as well as dry cough without dyspnea or wheezing.  When asked about loss of taste and smell she stated that she has not been having much appetite to start with.  No chest pain or palpitations.  She has not been vaccinated for COVID-19.  Upon presentation to the emergency room, blood pressure was 119/51 with otherwise normal vital signs.  Labs revealed mild hypokalemia of 3.3 with otherwise unremarkable CMP.  CBC was within normal.  UA was unremarkable.  Two-view chest x-ray showed no acute cardiopulmonary disease.  EKG showed normal sinus rhythm with a rate of 63 with anteroseptal Q waves.  COVID-19 PCR came back positive  The patient was given IV monoclonal antibody infusion earlier in the ER.  She was too weak to ambulate.  She would sit on the bed and will get lightheaded.  She will be admitted to a medically monitored observation isolation bed for further evaluation and management PAST MEDICAL HISTORY:   Past Medical History:  Diagnosis Date  . Arthritis    "knees, shoulders, chest" (02/03/2014)  . Bruises easily   . Cervical cancer ([place]) 1965  . High cholesterol   . History of kidney stones   . Hypertension    DENIES   . Tendency toward bleeding easily ([place])     PAST SURGICAL  HISTORY:   Past Surgical History:  Procedure Laterality Date  . ABDOMINAL HYSTERECTOMY  1965  . APPENDECTOMY    . CHOLECYSTECTOMY    . KNEE ARTHROSCOPY Left    [facility]  . PARTIAL KNEE ARTHROPLASTY Left 01/26/2019   Procedure: UNICOMPARTMENTAL KNEE;  Surgeon: Marchia Bond, MD;  Location: [place];  Service: Orthopedics;  Laterality: Left;  . SHOULDER OPEN ROTATOR CUFF REPAIR Bilateral 2007,  2010   [facility]  . THROAT SURGERY  2014   "arthritis hugh knot" (02/03/2014)    SOCIAL HISTORY:   Social History   Tobacco Use  . Smoking status: Former Smoker    Packs/day: 2.00    Years: 7.00    Pack years: 14.00    Types: Cigarettes  . Smokeless tobacco: Never Used  . Tobacco comment: 02/03/2014 "quit smoking in the 1960's"  Substance Use Topics  . Alcohol use: No    FAMILY HISTORY:   Family History  Problem Relation Age of Onset  . Peripheral vascular disease Mother   . Stroke Father   . Hyperlipidemia Father   . Heart disease Father 38  . Cancer Neg Hx     DRUG ALLERGIES:   Allergies  Allergen Reactions  . Tramadol Anaphylaxis and Other (See Comments)    Lowered Blood Pressure  . Amoxicillin Rash  . Promethazine Hcl Rash  . Sulfa Drugs Cross Reactors Rash    REVIEW OF SYSTEMS:   ROS As per history of present  illness. All pertinent systems were reviewed above. Constitutional, HEENT, cardiovascular, respiratory, GI, GU, musculoskeletal, neuro, psychiatric, endocrine, integumentary and hematologic systems were reviewed and are otherwise negative/unremarkable except for positive findings mentioned above in the HPI.   MEDICATIONS AT HOME:   Prior to Admission medications   Medication Sig Start Date End Date Taking? Authorizing Provider  acetaminophen (TYLENOL) 650 MG CR tablet Take 650 mg by mouth every 8 (eight) hours as needed for pain.     [provider]  amitriptyline (ELAVIL) 25 MG tablet TAKE ONE TABLET AT BEDTIME NEED APP FOR NEXT REFILL 07/03/20   Crecencio Mc, MD  atorvastatin (LIPITOR) 20 MG tablet TAKE ONE TABLET EVERY DAY 02/29/20   Crecencio Mc, MD  diclofenac sodium (VOLTAREN) 1 % GEL Apply 2 g topically 4 (four) times daily as needed (for knee pain). 12/24/18   Crecencio Mc, MD  DOTTI 0.1 MG/24HR patch CHANGE PATCH TWICE WEEKLY 05/29/20   Crecencio Mc, MD  liothyronine (CYTOMEL) 5 MCG tablet TAKE 2 TABLETS BY MOUTH DAILY 06/18/20   Crecencio Mc, MD  Multiple Vitamins-Minerals (CENTRUM SILVER PO) Take 1 tablet by mouth daily.    [provider]  polyethylene glycol (MIRALAX / GLYCOLAX) packet Take 17 g by mouth daily as needed for moderate constipation.     [provider]  potassium chloride SA (KLOR-CON) 20 MEQ tablet Take 1 tablet (20 mEq total) by mouth daily. 03/11/20   Crecencio Mc, MD  protein supplement (RESOURCE BENEPROTEIN) POWD Take 1 scoop by mouth daily as needed (constipation).     [provider]  sennosides-docusate sodium (SENOKOT-S) 8.6-50 MG tablet Take 2 tablets by mouth daily. 01/26/19   Marchia Bond, MD  vitamin B-12 (CYANOCOBALAMIN) 1000 MCG tablet Take 1,000 mcg by mouth daily.     [provider]      VITAL SIGNS:  Blood pressure 127/64, pulse 71, temperature 99.7 F (37.6 C), temperature source Oral, resp. rate 18, height 5\' 2"  (1.575 m), weight 50.3 kg, SpO2 99 %.  PHYSICAL EXAMINATION:  Physical Exam  GENERAL:  86 y.o.-year-old Caucasian female patient lying in the bed with no acute distress.  EYES: Pupils equal, round, reactive to light and accommodation. No scleral icterus. Extraocular muscles intact.  HEENT: Head atraumatic, normocephalic. Oropharynx and nasopharynx clear.  NECK:  Supple, no jugular venous distention. No thyroid enlargement, no tenderness.  LUNGS: Normal breath sounds bilaterally, no wheezing, rales,rhonchi or crepitation. No use of accessory muscles of respiration.  CARDIOVASCULAR: Regular rate and rhythm, S1, S2 normal. No murmurs, rubs,  or gallops.  ABDOMEN: Soft, nondistended, nontender. Bowel sounds present. No organomegaly or mass.  EXTREMITIES: No pedal edema, cyanosis, or clubbing.  NEUROLOGIC: Cranial nerves II through XII are intact. Muscle strength 5/5 in all extremities. Sensation intact. Gait not checked.  PSYCHIATRIC: The patient is alert and oriented x 3.  Normal affect and good eye contact. SKIN: No obvious rash, lesion, or ulcer.   LABORATORY PANEL:   CBC Recent Labs  Lab 07/16/20 0815  WBC 6.2  HGB 14.2  HCT 43.5  PLT 236   ------------------------------------------------------------------------------------------------------------------  Chemistries  Recent Labs  Lab 07/16/20 0815  NA 136  K 3.3*  CL 98  CO2 24  GLUCOSE 107*  BUN 13  CREATININE 0.99  CALCIUM 8.6*  AST 37  ALT 22  ALKPHOS 64  BILITOT 0.6   ------------------------------------------------------------------------------------------------------------------  Cardiac Enzymes No results for input(s): TROPONINI in the last 168 hours. ------------------------------------------------------------------------------------------------------------------  RADIOLOGY:  DG Chest 2 View  Result Date: 07/16/2020 CLINICAL DATA:  Weakness. COVID-19 exposure. Evaluation for pneumonia. EXAM: CHEST - 2 VIEW COMPARISON:  06/18/2020 FINDINGS: The cardiomediastinal silhouette is within normal limits. The lungs are well inflated. No confluent airspace opacity, edema, pleural effusion, pneumothorax is identified. A 2 mm nodular density projecting over the right upper lung on the PA radiograph is favored to reflect a vascular summation shadow. Bilateral shoulder degenerative changes and postoperative changes to the distal right clavicle are noted. IMPRESSION: No evidence of active cardiopulmonary disease. Electronically Signed   By: Logan Bores M.D.   On: 07/16/2020 14:22      IMPRESSION AND PLAN:   1.  COVID-19 virus infection, with mainly GI  manifestation with diarrhea as well as significant generalized weakness without respiratory symptoms. -The patient will be admitted to a medical monitored isolation bed. -She was given IV monoclonal antibodies. -She will be given IV remdesivir as well as vitamin C, vitamin D3 and zinc sulfate. -She will be placed on aspirin. -We will monitor her oxygenation.  2.  Hypokalemia. -Potassium replaced and magnesium level will be checked.  3.  Dyslipidemia. -Statin therapy will be resumed.  4.  Depression. -Escitalopram will be continued.  5.  Vitamin B12 deficiency. -Vitamin B12 will be continued.  6.  DVT prophylaxis. -Subcutaneous Lovenox.  All the records are reviewed and case discussed with ED provider. The plan of care was discussed in details with the patient (and family). I answered all questions. The patient agreed to proceed with the above mentioned plan. Further management will depend upon hospital course.   CODE STATUS: Full code  Status is: Observation  The patient remains OBS appropriate and will d/c before 2 midnights.  Dispo: The patient is from: Home              Anticipated d/c is to: Home              Anticipated d/c date is: 1 day              Patient currently is not medically stable to d/c.  TOTAL TIME TAKING CARE OF THIS PATIENT: 55 minutes.    Christel Mormon M.D on 07/16/2020 at 9:09 PM  [facility]   From 7 PM-7 AM, contact night-coverage [email or web address]  CC: Primary care physician; Crecencio Mc, MD   Note: This dictation was prepared with Dragon dictation along with smaller phrase technology. Any transcriptional typo errors that result from this process are unintentional.

## 2020-07-16 NOTE — ED Provider Notes (Signed)
I assumed care of this patient at approximately noon today.  Patient is coming in with fatigue, weakness, cough, and some diarrhea.  She is found to be Covid positive.  She did consent to receive her general treatment in the ED.  Initial plan was to reassess patient after regenerative treatment and possibly DC home however on my reassessment patient still too weak to ambulate on her own and given she lives alone and is 86 years old we will plan to admit to medicine service for observation.  Medications  0.9 %  sodium chloride infusion (has no administration in time range)  diphenhydrAMINE (BENADRYL) injection 50 mg (has no administration in time range)  famotidine (PEPCID) IVPB 20 mg premix (has no administration in time range)  methylPREDNISolone sodium succinate (SOLU-MEDROL) 125 mg/2 mL injection 125 mg (has no administration in time range)  albuterol (VENTOLIN HFA) 108 (90 Base) MCG/ACT inhaler 2 puff (has no administration in time range)  EPINEPHrine (EPI-PEN) injection 0.3 mg (has no administration in time range)  lactated ringers bolus 1,000 mL (has no administration in time range)  casirivimab-imdevimab (REGEN-COV) 1,200 mg in sodium chloride 0.9 % 110 mL IVPB (0 mg Intravenous Stopped 07/16/20 1852)      Lucrezia Starch, MD 07/16/20 2105

## 2020-07-16 NOTE — ED Notes (Signed)
Room air sats 100%

## 2020-07-16 NOTE — ED Triage Notes (Signed)
Pt comes into the ED via EMS from home , states she was dx with c-diff 3 weeks ago and has had diarrhea with body aches for the past 3 weeks, states she need a covid test because she was around a friend of hers that was dx with it.

## 2020-07-16 NOTE — Progress Notes (Signed)
Remdesivir - Pharmacy Brief Note   O:  ALT: 22  CXR:  SpO2: 99 % on RA    A/P:  Remdesivir 200 mg IVPB once followed by 100 mg IVPB daily x 4 days.   Asiana Benninger D 07/16/2020 9:44 PM

## 2020-07-16 NOTE — ED Triage Notes (Signed)
Pt in via EMS from home with c/o COVID exposure. Pt was exposed Monday and was supposed to get tested Friday but missed it and is supposed to go today. Pt with diarrhea, and bodyaches. HR 62, 121/51, 97.7temp.

## 2020-07-16 NOTE — ED Notes (Signed)
Pt given coke and water

## 2020-07-16 NOTE — Progress Notes (Signed)
Pharmacy COVID-19 Monoclonal Antibody Screening  Emily Wu was identified as being not hospitalized with symptoms from Covid-19 on admission but an incidental positive PCR has been documented.  The patient may qualify for the use of monoclonal antibodies (mAB) for COVID-19 viral infection to prevent worsening symptoms stemming from Covid-19 infection.  The patient was identified based on a positive COVID-19 PCR with no known history of COVID-19 infection and/or vaccination and not requiring the use of supplemental oxygen at this time.  This patient meets the FDA criteria for Emergency Use Authorization of casirivimab/imdevimab.  Has a (+) direct SARS-CoV-2 viral test result  Is NOT hospitalized due to COVID-19  Is within 10 days of symptom onset  Has at least one of the high risk factor(s) for progression to severe COVID-19 and/or hospitalization as defined in EUA.  Specific high risk criteria : Older age (>/= 84 yo)  This eligibility and indication for treatment was discussed with the patient's physician: Merlyn Lot, MD  Plan: Based on the above discussion, it was decided that the patient will receive one dose of mAB combination.Casirivimab/imdevimab has been ordered. Pharmacy will coordinate administration timing with patient's nurse. Recommended infusion monitoring parameters communicated to the nursing team. Patient should be monitored during infusion and for at least one hour after the end of the infusion.   Brendolyn Patty, PharmD Clinical Pharmacist  07/16/2020   4:19 PM

## 2020-07-17 DIAGNOSIS — R531 Weakness: Secondary | ICD-10-CM | POA: Diagnosis not present

## 2020-07-17 DIAGNOSIS — J1282 Pneumonia due to coronavirus disease 2019: Secondary | ICD-10-CM | POA: Diagnosis not present

## 2020-07-17 DIAGNOSIS — E039 Hypothyroidism, unspecified: Secondary | ICD-10-CM | POA: Diagnosis present

## 2020-07-17 DIAGNOSIS — K219 Gastro-esophageal reflux disease without esophagitis: Secondary | ICD-10-CM | POA: Diagnosis present

## 2020-07-17 DIAGNOSIS — R911 Solitary pulmonary nodule: Secondary | ICD-10-CM | POA: Diagnosis not present

## 2020-07-17 DIAGNOSIS — R600 Localized edema: Secondary | ICD-10-CM | POA: Diagnosis not present

## 2020-07-17 DIAGNOSIS — Z515 Encounter for palliative care: Secondary | ICD-10-CM | POA: Diagnosis not present

## 2020-07-17 DIAGNOSIS — E785 Hyperlipidemia, unspecified: Secondary | ICD-10-CM | POA: Diagnosis present

## 2020-07-17 DIAGNOSIS — Z7189 Other specified counseling: Secondary | ICD-10-CM | POA: Diagnosis not present

## 2020-07-17 DIAGNOSIS — Z9189 Other specified personal risk factors, not elsewhere classified: Secondary | ICD-10-CM | POA: Diagnosis not present

## 2020-07-17 DIAGNOSIS — Z90710 Acquired absence of both cervix and uterus: Secondary | ICD-10-CM | POA: Diagnosis not present

## 2020-07-17 DIAGNOSIS — Z79899 Other long term (current) drug therapy: Secondary | ICD-10-CM | POA: Diagnosis not present

## 2020-07-17 DIAGNOSIS — Z87891 Personal history of nicotine dependence: Secondary | ICD-10-CM | POA: Diagnosis not present

## 2020-07-17 DIAGNOSIS — Z66 Do not resuscitate: Secondary | ICD-10-CM | POA: Diagnosis not present

## 2020-07-17 DIAGNOSIS — R197 Diarrhea, unspecified: Secondary | ICD-10-CM | POA: Diagnosis not present

## 2020-07-17 DIAGNOSIS — G9341 Metabolic encephalopathy: Secondary | ICD-10-CM | POA: Diagnosis present

## 2020-07-17 DIAGNOSIS — E7800 Pure hypercholesterolemia, unspecified: Secondary | ICD-10-CM | POA: Diagnosis present

## 2020-07-17 DIAGNOSIS — J90 Pleural effusion, not elsewhere classified: Secondary | ICD-10-CM | POA: Diagnosis not present

## 2020-07-17 DIAGNOSIS — Z6820 Body mass index (BMI) 20.0-20.9, adult: Secondary | ICD-10-CM | POA: Diagnosis not present

## 2020-07-17 DIAGNOSIS — Z96652 Presence of left artificial knee joint: Secondary | ICD-10-CM | POA: Diagnosis present

## 2020-07-17 DIAGNOSIS — K5900 Constipation, unspecified: Secondary | ICD-10-CM | POA: Diagnosis not present

## 2020-07-17 DIAGNOSIS — F329 Major depressive disorder, single episode, unspecified: Secondary | ICD-10-CM | POA: Diagnosis present

## 2020-07-17 DIAGNOSIS — J189 Pneumonia, unspecified organism: Secondary | ICD-10-CM | POA: Diagnosis not present

## 2020-07-17 DIAGNOSIS — J9601 Acute respiratory failure with hypoxia: Secondary | ICD-10-CM | POA: Diagnosis not present

## 2020-07-17 DIAGNOSIS — F320 Major depressive disorder, single episode, mild: Secondary | ICD-10-CM | POA: Diagnosis not present

## 2020-07-17 DIAGNOSIS — E43 Unspecified severe protein-calorie malnutrition: Secondary | ICD-10-CM | POA: Diagnosis not present

## 2020-07-17 DIAGNOSIS — U071 COVID-19: Secondary | ICD-10-CM | POA: Diagnosis not present

## 2020-07-17 DIAGNOSIS — E876 Hypokalemia: Secondary | ICD-10-CM | POA: Diagnosis not present

## 2020-07-17 DIAGNOSIS — R627 Adult failure to thrive: Secondary | ICD-10-CM | POA: Diagnosis present

## 2020-07-17 DIAGNOSIS — Z8541 Personal history of malignant neoplasm of cervix uteri: Secondary | ICD-10-CM | POA: Diagnosis not present

## 2020-07-17 DIAGNOSIS — E538 Deficiency of other specified B group vitamins: Secondary | ICD-10-CM | POA: Diagnosis not present

## 2020-07-17 DIAGNOSIS — I10 Essential (primary) hypertension: Secondary | ICD-10-CM | POA: Diagnosis present

## 2020-07-17 LAB — COMPREHENSIVE METABOLIC PANEL
ALT: 20 U/L (ref 0–44)
AST: 35 U/L (ref 15–41)
Albumin: 3 g/dL — ABNORMAL LOW (ref 3.5–5.0)
Alkaline Phosphatase: 55 U/L (ref 38–126)
Anion gap: 8 (ref 5–15)
BUN: 15 mg/dL (ref 8–23)
CO2: 25 mmol/L (ref 22–32)
Calcium: 7.7 mg/dL — ABNORMAL LOW (ref 8.9–10.3)
Chloride: 104 mmol/L (ref 98–111)
Creatinine, Ser: 0.87 mg/dL (ref 0.44–1.00)
GFR calc Af Amer: >60 mL/min (ref >60)
GFR calc non Af Amer: 60 mL/min — ABNORMAL LOW (ref >60)
Glucose, Bld: 90 mg/dL (ref 70–99)
Potassium: 3.9 mmol/L (ref 3.5–5.1)
Sodium: 137 mmol/L (ref 135–145)
Total Bilirubin: 0.5 mg/dL (ref 0.3–1.2)
Total Protein: 5.8 g/dL — ABNORMAL LOW (ref 6.5–8.1)

## 2020-07-17 LAB — TROPONIN I (HIGH SENSITIVITY)
Troponin I (High Sensitivity): 14 ng/L (ref <18)
Troponin I (High Sensitivity): 19 ng/L — ABNORMAL HIGH (ref <18)

## 2020-07-17 LAB — CBC WITH DIFFERENTIAL/PLATELET
Abs Immature Granulocytes: 0.02 10*3/uL (ref 0.00–0.07)
Basophils Absolute: 0 10*3/uL (ref 0.0–0.1)
Basophils Relative: 0 %
Eosinophils Absolute: 0 10*3/uL (ref 0.0–0.5)
Eosinophils Relative: 0 %
HCT: 38 % (ref 36.0–46.0)
Hemoglobin: 12.6 g/dL (ref 12.0–15.0)
Immature Granulocytes: 0 %
Lymphocytes Relative: 35 %
Lymphs Abs: 1.9 10*3/uL (ref 0.7–4.0)
MCH: 30.6 pg (ref 26.0–34.0)
MCHC: 33.2 g/dL (ref 30.0–36.0)
MCV: 92.2 fL (ref 80.0–100.0)
Monocytes Absolute: 0.7 10*3/uL (ref 0.1–1.0)
Monocytes Relative: 12 %
Neutro Abs: 2.9 10*3/uL (ref 1.7–7.7)
Neutrophils Relative %: 53 %
Platelets: 187 10*3/uL (ref 150–400)
RBC: 4.12 MIL/uL (ref 3.87–5.11)
RDW: 13.9 % (ref 11.5–15.5)
WBC: 5.5 10*3/uL (ref 4.0–10.5)
nRBC: 0 % (ref 0.0–0.2)

## 2020-07-17 LAB — FIBRINOGEN: Fibrinogen: 461 mg/dL (ref 210–475)

## 2020-07-17 LAB — URINALYSIS, COMPLETE (UACMP) WITH MICROSCOPIC
Bilirubin Urine: NEGATIVE
Glucose, UA: NEGATIVE mg/dL
Ketones, ur: 5 mg/dL — AB
Leukocytes,Ua: NEGATIVE
Nitrite: NEGATIVE
Protein, ur: NEGATIVE mg/dL
Specific Gravity, Urine: 1.015 (ref 1.005–1.030)
pH: 5 (ref 5.0–8.0)

## 2020-07-17 LAB — PROCALCITONIN: Procalcitonin: <0.1 ng/mL

## 2020-07-17 LAB — C-REACTIVE PROTEIN
CRP: 0.5 mg/dL (ref <1.0)
CRP: 0.6 mg/dL (ref <1.0)

## 2020-07-17 LAB — FERRITIN
Ferritin: 255 ng/mL (ref 11–307)
Ferritin: 214 ng/mL (ref 11–307)

## 2020-07-17 LAB — FIBRIN DERIVATIVES D-DIMER (ARMC ONLY)
Fibrin derivatives D-dimer (ARMC): 958.27 ng/mL (FEU) — ABNORMAL HIGH (ref 0.00–499.00)
Fibrin derivatives D-dimer (ARMC): 1046.72 ng/mL (FEU) — ABNORMAL HIGH (ref 0.00–499.00)

## 2020-07-17 LAB — LACTATE DEHYDROGENASE: LDH: 176 U/L (ref 98–192)

## 2020-07-17 MED ORDER — CHLORHEXIDINE GLUCONATE CLOTH 2 % EX PADS
6.0000 | MEDICATED_PAD | Freq: Every day | CUTANEOUS | Status: DC
  Administered 2020-07-17 – 2020-07-19 (×3): 6 via TOPICAL

## 2020-07-17 MED ORDER — DEXTROSE-NACL 5-0.45 % IV SOLN: INTRAVENOUS | Status: DC

## 2020-07-17 MED ORDER — DOXEPIN HCL 25 MG PO CAPS
25.0000 mg | ORAL_CAPSULE | Freq: Once | ORAL | Status: AC
  Administered 2020-07-17: 25 mg via ORAL
  Filled 2020-07-17: qty 1

## 2020-07-17 NOTE — Progress Notes (Addendum)
[facility]  PROGRESS NOTE  ANTONIETTA LANSDOWNE JKK:[id number] DOB: 09-24-1931 DOA: 07/16/2020 PCP: Crecencio Mc, MD   Brief HPI:   86 year old female with a history of hypertension, dyslipidemia, osteoarthritis came to ED with acute onset of generalized body aches and significant weakness with associated recurrent diarrhea.nausea vomiting.  Also had mild rhinorrhea with nasal congestion as well as dry cough, without dyspnea or wheezing.  Patient did not receive COVID-19 vaccine.  In the ED: T PCR test came back positive.      Subjective   Patient seen and examined, denies chest pain or shortness of breath.  No nausea or vomiting.  Complains of being cold.   Assessment/Plan:     1. COVID-19 infection-patient presenting with diarrhea only, no respiratory symptoms.  Started on remdesivir, vitamin C, vitamin D3, zinc sulfate.  Also started on aspirin.  Patient not requiring oxygen.  Continued on respiratory pharmacy.  She was earlier prescribed IV meropenem antibodies however she did not be criteria so they were not administered. 2. Hypokalemia-likely from diarrhea, potassium replaced.  Today potassium 3.9. 3. Dyslipidemia-continue statin therapy. 4. Depression-continue Escitalopram. 5. Vitamin B12 deficiency-vitamin B12. 6. Nodule on CXR- 2 mm nodule on CXR, ? Vascular shadow. She will need repeat CXR or CT chest  as outpatient.     COVID-19 Labs  Recent Labs    07/16/20 2321 07/17/20 0450  FERRITIN 255 214  LDH 176  --   CRP 0.5 0.6    Lab Results  Component Value Date   SARSCOV2NAA POSITIVE (A) 07/16/2020     Scheduled medications:   . vitamin C  500 mg Oral Daily  . aspirin EC  81 mg Oral Daily  . atorvastatin  20 mg Oral Daily  . cholecalciferol  1,000 Units Oral Daily  . enoxaparin (LOVENOX) injection  40 mg Subcutaneous Q24H  . famotidine  20 mg Oral BID  . guaiFENesin  600 mg Oral BID  . liothyronine  10 mcg Oral Daily  . multivitamin with minerals  1  tablet Oral Daily  . potassium chloride SA  20 mEq Oral Daily  . senna-docusate  2 tablet Oral Daily  . vitamin B-12  1,000 mcg Oral Daily  . zinc sulfate  220 mg Oral Daily    CBG: No results for input(s): GLUCAP in the last 168 hours.  SpO2: 99 %    CBC: Recent Labs  Lab 07/16/20 0815 07/17/20 0450  WBC 6.2 5.5  NEUTROABS  --  2.9  HGB 14.2 12.6  HCT 43.5 38.0  MCV 92.9 92.2  PLT 236 716    Basic Metabolic Panel: Recent Labs  Lab 07/16/20 0815 07/17/20 0450  NA 136 137  K 3.3* 3.9  CL 98 104  CO2 24 25  GLUCOSE 107* 90  BUN 13 15  CREATININE 0.99 0.87  CALCIUM 8.6* 7.7*     Liver Function Tests: Recent Labs  Lab 07/16/20 0815 07/17/20 0450  AST 37 35  ALT 22 20  ALKPHOS 64 55  BILITOT 0.6 0.5  PROT 7.0 5.8*  ALBUMIN 3.7 3.0*     Antibiotics: Anti-infectives (From admission, onward)   Start     Dose/Rate Route Frequency Ordered Stop   07/17/20 1000  remdesivir 100 mg in sodium chloride 0.9 % 100 mL IVPB       "Followed by" Linked Group Details   100 mg 200 mL/hr over 30 Minutes Intravenous Daily 07/16/20 2145 07/21/20 0959   07/17/20 1000  remdesivir 100 mg  in sodium chloride 0.9 % 100 mL IVPB  Status:  Discontinued       "Followed by" Linked Group Details   100 mg 200 mL/hr over 30 Minutes Intravenous Daily 07/16/20 2227 07/16/20 2231   07/16/20 2230  remdesivir 200 mg in sodium chloride 0.9% 250 mL IVPB  Status:  Discontinued       "Followed by" Linked Group Details   200 mg 580 mL/hr over 30 Minutes Intravenous Once 07/16/20 2227 07/16/20 2231   07/16/20 2145  remdesivir 200 mg in sodium chloride 0.9% 250 mL IVPB       "Followed by" Linked Group Details   200 mg 580 mL/hr over 30 Minutes Intravenous Once 07/16/20 2145 07/17/20 0237       DVT prophylaxis: Lovenox  Code Status: Full code  Family Communication: No family at bedside    Status is: Inpatient  Dispo: The patient is from: Home              Anticipated d/c is to:  Home              Anticipated d/c date is: 07/19/2020              Patient currently not medically stable for discharge  Barrier to discharge-: Infection, receiving remdesivir.      Consultants:    Procedures:     Objective   Vitals:   07/17/20 0559 07/17/20 0700 07/17/20 0730 07/17/20 0833  BP: (!) 143/65 114/66 131/68 (!) 118/50  Pulse: 74 78 75 75  Resp: 15 19  16   Temp:  99.1 F (37.3 C)  99.1 F (37.3 C)  TempSrc:  Oral    SpO2: 95% 96% 97% 99%  Weight:      Height:        Intake/Output Summary (Last 24 hours) at 07/17/2020 1555 Last data filed at 07/17/2020 7416 Gross per 24 hour  Intake 1250 ml  Output --  Net 1250 ml    08/22 1901 - 08/24 0700 In: 1250  Out: -   Filed Weights   07/16/20 0825  Weight: 50.3 kg    Physical Examination:    General: Appears in no acute distress  Cardiovascular: S1-S2, regular  Respiratory: Clear to auscultation bilaterally  Abdomen: Abdomen is soft, nontender, no organomegaly  Extremities: No edema in the lower extremities  Neurologic: Alert, oriented x3, intact insight and judgment, no focal deficit noted    Data Reviewed:   Recent Results (from the past 240 hour(s))  SARS Coronavirus 2 by RT PCR (hospital order, performed in [place] hospital lab) Nasopharyngeal Nasopharyngeal Swab     Status: Abnormal   Collection Time: 07/16/20  1:16 PM   Specimen: Nasopharyngeal Swab  Result Value Ref Range Status   SARS Coronavirus 2 POSITIVE (A) NEGATIVE Final    Comment: RESULT CALLED TO, READ BACK BY AND VERIFIED WITH: NOAH GRIFFITH RN AT 1524 ON 07/16/20 SNG  (NOTE) SARS-CoV-2 target nucleic acids are DETECTED  SARS-CoV-2 RNA is generally detectable in upper respiratory specimens  during the acute phase of infection.  Positive results are indicative  of the presence of the identified virus, but do not rule out bacterial infection or co-infection with other pathogens not detected by the test.  Clinical  correlation with patient history and  other diagnostic information is necessary to determine patient infection status.  The expected result is negative.  Fact Sheet for Patients:   StrictlyIdeas.no   Fact Sheet for Healthcare Providers:  BankingDealers.co.za    This test is not yet approved or cleared by the [place] and  has been authorized for detection and/or diagnosis of SARS-CoV-2 by FDA under an Emergency Use Authorization (EUA).  This EUA will remain in effect (meaning t his test can be used) for the duration of  the COVID-19 declaration under Section 564(b)(1) of the Act, 21 U.S.C. section 360-bbb-3(b)(1), unless the authorization is terminated or revoked sooner.  Performed at [facility], [address] Bhat [place], [place] [address]     Recent Labs  Lab 07/16/20 [id number]  LIPASE 26    Studies:  DG Chest 2 View  Result Date: 07/16/2020 CLINICAL DATA:  Weakness. COVID-19 exposure. Evaluation for pneumonia. EXAM: CHEST - 2 VIEW COMPARISON:  06/18/2020 FINDINGS: The cardiomediastinal silhouette is within normal limits. The lungs are well inflated. No confluent airspace opacity, edema, pleural effusion, pneumothorax is identified. A 2 mm nodular density projecting over the right upper lung on the PA radiograph is favored to reflect a vascular summation shadow. Bilateral shoulder degenerative changes and postoperative changes to the distal right clavicle are noted. IMPRESSION: No evidence of active cardiopulmonary disease. Electronically Signed   By: Logan Bores M.D.   On: 07/16/2020 14:22       [place]   [facility] If 7PM-7AM, please contact night-coverage at [email or web address], Office  [phone]   07/17/2020, 3:55 PM  LOS: 0 days

## 2020-07-17 NOTE — Progress Notes (Signed)
PT Cancellation Note  Patient Details Name: Emily Wu MRN: [id number] DOB: 04/26/1931   Cancelled Treatment:    Reason Eval/Treat Not Completed: Medical issues which prohibited therapy Chart reviewed, gowned-up for Covid room.  Spoke with nurse before entering and she reports pt has not been getting up because of persistent diarrhea all day.  States "May be tomorrow, but I don't think she'll be able to do it today."  Kreg Shropshire, DPT 07/17/2020, 4:19 PM

## 2020-07-18 LAB — FERRITIN: Ferritin: 260 ng/mL (ref 11–307)

## 2020-07-18 LAB — COMPREHENSIVE METABOLIC PANEL
ALT: 19 U/L (ref 0–44)
AST: 33 U/L (ref 15–41)
Albumin: 3 g/dL — ABNORMAL LOW (ref 3.5–5.0)
Alkaline Phosphatase: 54 U/L (ref 38–126)
Anion gap: 9 (ref 5–15)
BUN: 17 mg/dL (ref 8–23)
CO2: 22 mmol/L (ref 22–32)
Calcium: 8.1 mg/dL — ABNORMAL LOW (ref 8.9–10.3)
Chloride: 106 mmol/L (ref 98–111)
Creatinine, Ser: 0.94 mg/dL (ref 0.44–1.00)
GFR calc Af Amer: >60 mL/min (ref >60)
GFR calc non Af Amer: 55 mL/min — ABNORMAL LOW (ref >60)
Glucose, Bld: 101 mg/dL — ABNORMAL HIGH (ref 70–99)
Potassium: 3.9 mmol/L (ref 3.5–5.1)
Sodium: 137 mmol/L (ref 135–145)
Total Bilirubin: 0.8 mg/dL (ref 0.3–1.2)
Total Protein: 5.8 g/dL — ABNORMAL LOW (ref 6.5–8.1)

## 2020-07-18 LAB — CBC WITH DIFFERENTIAL/PLATELET
Abs Immature Granulocytes: 0.01 10*3/uL (ref 0.00–0.07)
Basophils Absolute: 0 10*3/uL (ref 0.0–0.1)
Basophils Relative: 0 %
Eosinophils Absolute: 0 10*3/uL (ref 0.0–0.5)
Eosinophils Relative: 0 %
HCT: 38.8 % (ref 36.0–46.0)
Hemoglobin: 12.6 g/dL (ref 12.0–15.0)
Immature Granulocytes: 0 %
Lymphocytes Relative: 69 %
Lymphs Abs: 2 10*3/uL (ref 0.7–4.0)
MCH: 30.1 pg (ref 26.0–34.0)
MCHC: 32.5 g/dL (ref 30.0–36.0)
MCV: 92.8 fL (ref 80.0–100.0)
Monocytes Absolute: 0.5 10*3/uL (ref 0.1–1.0)
Monocytes Relative: 17 %
Neutro Abs: 0.4 10*3/uL — ABNORMAL LOW (ref 1.7–7.7)
Neutrophils Relative %: 14 %
Platelets: 179 10*3/uL (ref 150–400)
RBC: 4.18 MIL/uL (ref 3.87–5.11)
RDW: 14.5 % (ref 11.5–15.5)
Smear Review: NORMAL
WBC: 2.9 10*3/uL — ABNORMAL LOW (ref 4.0–10.5)
nRBC: 0 % (ref 0.0–0.2)

## 2020-07-18 LAB — C-REACTIVE PROTEIN: CRP: 1.4 mg/dL — ABNORMAL HIGH (ref <1.0)

## 2020-07-18 LAB — FIBRIN DERIVATIVES D-DIMER (ARMC ONLY): Fibrin derivatives D-dimer (ARMC): 941 ng/mL (FEU) — ABNORMAL HIGH (ref 0.00–499.00)

## 2020-07-18 MED ORDER — SODIUM CHLORIDE 0.9 % IV BOLUS
1000.0000 mL | Freq: Once | INTRAVENOUS | Status: AC
  Administered 2020-07-18: 15:00:00 1000 mL via INTRAVENOUS

## 2020-07-18 MED ORDER — DEXTROSE IN LACTATED RINGERS 5 % IV SOLN: INTRAVENOUS | Status: DC

## 2020-07-18 MED ORDER — LACTATED RINGERS IV BOLUS
1000.0000 mL | Freq: Once | INTRAVENOUS | Status: AC
  Administered 2020-07-18: 10:00:00 1000 mL via INTRAVENOUS

## 2020-07-18 NOTE — Progress Notes (Signed)
[facility] Progress Note  Patient: Emily Wu    WNI:[id number]  DOA: 07/16/2020     Date of Service: the patient was seen and examined on 07/18/2020  Brief hospital course: Past medical history of HTN, HLD, OA.  Presents with complaints of body ache and weakness and diarrhea and found to have COVID-19 illness. Currently plan is continue hydration.  Assessment and Plan: 1. Acute COVID-19 Viral infection CXR: no bilateral peripheral opacities Tmax last 24 hours:  Temp (24hrs), Avg:98.4 F (36.9 C), Min:98.2 F (36.8 C), Max:98.7 F (37.1 C)  Oxygen requirements: On room air CRP: 1.4-0.6 Remdesivir: Started on 07/16/2020 Steroids: Currently not indicated given lack of hypoxia Baricitinib/Actemra(off-label use): Currently not indicated The investigational nature of this medication was discussed with the patient/HCPOA and they choose to proceed as the potential benefits are felt to outweigh risks at this time.  Monoclonal antibody received on admission Antibiotics: None Vitamin C and Zinc: Continue DVT Prophylaxis: enoxaparin (LOVENOX) injection 40 mg Start: 07/16/20 2230    Prone positioning: Patient encouraged to stay in prone position as much as possible.  The treatment plan and use of medications and known side effects were discussed with patient/family. It was clearly explained that Complete risks and long-term side effects are unknown, however in the best clinical judgment they seem to be of some clinical benefit rather than medical risks. Patient/family agree with the treatment plan and want to receive these treatments as indicated.   2.  Hypokalemia. Being replaced.  Monitor.  3.  Hyperlipidemia Continue statin  4.  Depression Stable. Currently continue Lexapro  5.  Vitamin B12 deficiency Continue replacement  6.  Pulmonary nodule Repeat CT scan chest x-ray outpatient once better from COVID-19.  7.  Anorexia In the setting of COVID-19  illness. Monitor. Change diet to regular diet.  8.  Hypothyroidism Continuing Cytomel   Diet: Regular diet DVT Prophylaxis:   enoxaparin (LOVENOX) injection 40 mg Start: 07/16/20 2230    Advance goals of care discussion: Full code  Family Communication: no family was present at bedside, at the time of interview.   Disposition:  Status is: Inpatient  Remains inpatient appropriate because:Hemodynamically unstable   Dispo: The patient is from: Home              Anticipated d/c is to: SNF              Anticipated d/c date is: 3 days              Patient currently is not medically stable to d/c.        Subjective: Remains fatigue and tired.  No nausea no vomiting.  No abdominal pain.  Minimal oral intake.  Physical Exam:  General: Appear in mild distress, no Rash; Oral Mucosa Clear, moist. no Abnormal Neck Mass Or lumps, Conjunctiva normal  Cardiovascular: S1 and S2 Present, no Murmur, Respiratory: good respiratory effort, Bilateral Air entry present and Clear to Auscultation, no Crackles, no wheezes Abdomen: Bowel Sound present, Soft and no tenderness Extremities: no Pedal edema, no calf tenderness Neurology: alert and oriented to time, place, and person affect appropriate. no new focal deficit Gait not checked due to patient safety concerns  Vitals:   07/18/20 0030 07/18/20 0420 07/18/20 0857 07/18/20 1637  BP: 104/60 99/60 (!) 102/32 (!) 113/41  Pulse: 70 61 (!) 50 72  Resp:  19 19 18   Temp: 98.2 F (36.8 C) 98.7 F (37.1 C) 98.4 F (36.9 C) 98.2 F (36.8 C)  TempSrc: Oral Oral Oral Oral  SpO2: 98% 97% 96% 95%  Weight:      Height:        Intake/Output Summary (Last 24 hours) at 07/18/2020 1744 Last data filed at 07/18/2020 1439 Gross per 24 hour  Intake 269.99 ml  Output 2800 ml  Net -2530.01 ml   Filed Weights   07/16/20 0825  Weight: 50.3 kg    Data Reviewed: I have personally reviewed and interpreted daily labs, tele strips, imagings as  discussed above. I reviewed all nursing notes, pharmacy notes, vitals, pertinent old records I have discussed plan of care as described above with RN and patient/family.  CBC: Recent Labs  Lab 07/16/20 0815 07/17/20 0450 07/18/20 0726  WBC 6.2 5.5 2.9*  NEUTROABS  --  2.9 0.4*  HGB 14.2 12.6 12.6  HCT 43.5 38.0 38.8  MCV 92.9 92.2 92.8  PLT 236 187 956   Basic Metabolic Panel: Recent Labs  Lab 07/16/20 0815 07/17/20 0450 07/18/20 0726  NA 136 137 137  K 3.3* 3.9 3.9  CL 98 104 106  CO2 24 25 22   GLUCOSE 107* 90 101*  BUN 13 15 17   CREATININE 0.99 0.87 0.94  CALCIUM 8.6* 7.7* 8.1*    Studies: No results found.  Scheduled Meds: . vitamin C  500 mg Oral Daily  . aspirin EC  81 mg Oral Daily  . atorvastatin  20 mg Oral Daily  . Chlorhexidine Gluconate Cloth  6 each Topical Daily  . cholecalciferol  1,000 Units Oral Daily  . enoxaparin (LOVENOX) injection  40 mg Subcutaneous Q24H  . famotidine  20 mg Oral BID  . guaiFENesin  600 mg Oral BID  . liothyronine  10 mcg Oral Daily  . multivitamin with minerals  1 tablet Oral Daily  . potassium chloride SA  20 mEq Oral Daily  . vitamin B-12  1,000 mcg Oral Daily  . zinc sulfate  220 mg Oral Daily   Continuous Infusions: . dextrose 5% lactated ringers 50 mL/hr at 07/18/20 1631  . remdesivir 100 mg in NS 100 mL 200 mL/hr at 07/18/20 1439   PRN Meds: acetaminophen, chlorpheniramine-HYDROcodone, guaiFENesin-dextromethorphan, magnesium hydroxide, ondansetron **OR** ondansetron (ZOFRAN) IV, traZODone  Time spent: 35 minutes  Author: Berle Mull, MD [facility] 07/18/2020 5:44 PM  To reach On-call, see care teams to locate the attending and reach out via [email or web address]. Between 7PM-7AM, please contact night-coverage If you still have difficulty reaching the attending provider, please page the [facility] (Director on Call) for [facility] on amion for assistance.

## 2020-07-18 NOTE — Progress Notes (Signed)
Patients BP 102/32 took again 91/39 HR 52 unable to do orthostatics she is too weak. Messaged MD, new order for LR 1000 bolus. Later BP BP is now 88/35, BP was low as 63/28. Messaged MD again new order for NS 1000 bolus and D5LR at 50 ml/hr. Later BP 113/41.  Will continue to monitor.

## 2020-07-18 NOTE — Evaluation (Signed)
Physical Therapy Evaluation Patient Details Name: Emily Wu MRN: [id number] DOB: 1931-02-15 Today's Date: 07/18/2020   History of Present Illness  presented to ER secondary to progressing weakness, recurrent diarrhea; admitted for management of COVID-19 viral infection with GI manifestation.  Clinical Impression  Upon evaluation, patient alert and oriented; follows commands and agreeable to participation with session.  Generally fatigued in appearance, soft spoken in voice.  Globally weak and deconditioned throughout all extremities, requiring increased time/effort for movement of all extremities.  Currently requiring mod assist for bed mobility; close sup for unsupported sitting balance; heavy mod assist for sit/stand (requiring bilat UE support for standing balance).  Maintains very flexed posture, posterior weight shift; poor balance reactions, poor activity tolerance.  Unsafe/unable to attempt stepping/gait at this time.  Will continue to assess/progress as appropriate. Would benefit from skilled PT to address above deficits and promote optimal return to PLOF.; recommend transition to STR upon discharge from acute hospitalization.     Follow Up Recommendations SNF    Equipment Recommendations       Recommendations for Other Services       Precautions / Restrictions Precautions Precautions: Fall Restrictions Weight Bearing Restrictions: No      Mobility  Bed Mobility Overal bed mobility: Needs Assistance Bed Mobility: Supine to Sit;Sit to Supine     Supine to sit: Mod assist Sit to supine: Mod assist      Transfers Overall transfer level: Needs assistance Equipment used: Rolling walker (2 wheeled) Transfers: Sit to/from Stand Sit to Stand: Mod assist         General transfer comment: extensive physical assist for lift off, standing balance and postural extension; requires bilat UE support for standing balance, maintains forward flexed posture, excessive  posterior lean with standing attempts  Ambulation/Gait             General Gait Details: unsafe/unable  Stairs            Wheelchair Mobility    Modified Rankin (Stroke Patients Only)       Balance Overall balance assessment: Needs assistance Sitting-balance support: No upper extremity supported;Feet supported Sitting balance-Leahy Scale: Good Sitting balance - Comments: maintains statically with sup/mod indep     Standing balance-Leahy Scale: Poor                               Pertinent Vitals/Pain Pain Assessment: No/denies pain    Home Living Family/patient expects to be discharged to:: Private residence Living Arrangements: Alone Available Help at Discharge: Family;Available PRN/intermittently Type of Home: House Home Access: Stairs to enter   [employer] of Steps: 2-3 Home Layout: One level        Prior Function Level of Independence: Independent         Comments: Indep with ADLs, household and community mobilization without assist device; has been using RW in recent days/weeks due to progressive weakness     Hand Dominance        Extremity/Trunk Assessment   Upper Extremity Assessment Upper Extremity Assessment: Generalized weakness (grossly 3+ to 4-/5 throughout)    Lower Extremity Assessment Lower Extremity Assessment: Generalized weakness (grossly 3+ to 4-/5 throughout)       Communication   Communication: No difficulties (generally soft spoken)  Cognition Arousal/Alertness: Awake/alert Behavior During Therapy: WFL for tasks assessed/performed Overall Cognitive Status: Within Functional Limits for tasks assessed  General Comments      Exercises Other Exercises Other Exercises: Orthostatic assessment-see vitals flowsheet for results.  Appropriate chronotropic response to upright positioning; no objective signs of orthostasis. Other Exercises:  Unsupported sitting edge of bed, sup: light grooming, face washing, set sup/sup.  Generally slow in movement and fatigues quickly, but able to complete without physical assist.   Assessment/Plan    PT Assessment Patient needs continued PT services  PT Problem List Decreased strength;Decreased range of motion;Decreased activity tolerance;Decreased balance;Decreased mobility;Decreased knowledge of use of DME;Decreased safety awareness;Decreased knowledge of precautions;Cardiopulmonary status limiting activity       PT Treatment Interventions DME instruction;Gait training;Stair training;Functional mobility training;Therapeutic activities;Therapeutic exercise;Balance training;Patient/family education;Cognitive remediation    PT Goals (Current goals can be found in the Care Plan section)  Acute Rehab PT Goals Patient Stated Goal: to get my strength back PT Goal Formulation: With patient Time For Goal Achievement: 08/01/20 Potential to Achieve Goals: Good    Frequency Min 2X/week   Barriers to discharge Decreased caregiver support      Co-evaluation               AM-PAC PT "6 Clicks" Mobility  Outcome Measure Help needed turning from your back to your side while in a flat bed without using bedrails?: None Help needed moving from lying on your back to sitting on the side of a flat bed without using bedrails?: A Lot Help needed moving to and from a bed to a chair (including a wheelchair)?: Total Help needed standing up from a chair using your arms (e.g., wheelchair or bedside chair)?: Total Help needed to walk in hospital room?: Total Help needed climbing 3-5 steps with a railing? : Total 6 Click Score: 10    End of Session   Activity Tolerance: Patient limited by fatigue Patient left: in bed;with call bell/phone within reach;with bed alarm set Nurse Communication: Mobility status PT Visit Diagnosis: Muscle weakness (generalized) (M62.81);Difficulty in walking, not elsewhere  classified (R26.2)    Time: 8676-7209 PT Time Calculation (min) (ACUTE ONLY): 36 min   Charges:   PT Evaluation $PT Eval Moderate Complexity: 1 Mod PT Treatments $Therapeutic Activity: 23-37 mins   Viliami Bracco H. Owens Shark, PT, DPT, NCS 07/18/20, 10:20 PM [phone]

## 2020-07-19 ENCOUNTER — Inpatient Hospital Stay: Payer: Medicare Other

## 2020-07-19 LAB — COMPREHENSIVE METABOLIC PANEL
ALT: 20 U/L (ref 0–44)
AST: 34 U/L (ref 15–41)
Albumin: 2.7 g/dL — ABNORMAL LOW (ref 3.5–5.0)
Alkaline Phosphatase: 49 U/L (ref 38–126)
Anion gap: 8 (ref 5–15)
BUN: 16 mg/dL (ref 8–23)
CO2: 23 mmol/L (ref 22–32)
Calcium: 8.2 mg/dL — ABNORMAL LOW (ref 8.9–10.3)
Chloride: 110 mmol/L (ref 98–111)
Creatinine, Ser: 0.8 mg/dL (ref 0.44–1.00)
GFR calc Af Amer: >60 mL/min (ref >60)
GFR calc non Af Amer: >60 mL/min (ref >60)
Glucose, Bld: 101 mg/dL — ABNORMAL HIGH (ref 70–99)
Potassium: 4.1 mmol/L (ref 3.5–5.1)
Sodium: 141 mmol/L (ref 135–145)
Total Bilirubin: 0.8 mg/dL (ref 0.3–1.2)
Total Protein: 5.3 g/dL — ABNORMAL LOW (ref 6.5–8.1)

## 2020-07-19 LAB — CBC WITH DIFFERENTIAL/PLATELET
Abs Immature Granulocytes: 0.01 10*3/uL (ref 0.00–0.07)
Basophils Absolute: 0 10*3/uL (ref 0.0–0.1)
Basophils Relative: 0 %
Eosinophils Absolute: 0 10*3/uL (ref 0.0–0.5)
Eosinophils Relative: 1 %
HCT: 34.9 % — ABNORMAL LOW (ref 36.0–46.0)
Hemoglobin: 11.9 g/dL — ABNORMAL LOW (ref 12.0–15.0)
Immature Granulocytes: 0 %
Lymphocytes Relative: 62 %
Lymphs Abs: 2.7 10*3/uL (ref 0.7–4.0)
MCH: 30.7 pg (ref 26.0–34.0)
MCHC: 34.1 g/dL (ref 30.0–36.0)
MCV: 90.2 fL (ref 80.0–100.0)
Monocytes Absolute: 0.5 10*3/uL (ref 0.1–1.0)
Monocytes Relative: 12 %
Neutro Abs: 1.1 10*3/uL — ABNORMAL LOW (ref 1.7–7.7)
Neutrophils Relative %: 25 %
Platelets: 183 10*3/uL (ref 150–400)
RBC: 3.87 MIL/uL (ref 3.87–5.11)
RDW: 14.3 % (ref 11.5–15.5)
Smear Review: NORMAL
WBC: 4.3 10*3/uL (ref 4.0–10.5)
nRBC: 0 % (ref 0.0–0.2)

## 2020-07-19 LAB — LACTIC ACID, PLASMA: Lactic Acid, Venous: 1.2 mmol/L (ref 0.5–1.9)

## 2020-07-19 LAB — URINE CULTURE: Culture: 40000 — AB

## 2020-07-19 LAB — C-REACTIVE PROTEIN: CRP: 1 mg/dL — ABNORMAL HIGH (ref <1.0)

## 2020-07-19 LAB — MAGNESIUM: Magnesium: 2 mg/dL (ref 1.7–2.4)

## 2020-07-19 LAB — FIBRIN DERIVATIVES D-DIMER (ARMC ONLY): Fibrin derivatives D-dimer (ARMC): 1190.23 ng/mL (FEU) — ABNORMAL HIGH (ref 0.00–499.00)

## 2020-07-19 LAB — FERRITIN: Ferritin: 292 ng/mL (ref 11–307)

## 2020-07-19 MED ORDER — ENSURE ENLIVE PO LIQD
237.0000 mL | Freq: Two times a day (BID) | ORAL | Status: DC
  Administered 2020-07-19 (×2): 237 mL via ORAL

## 2020-07-19 MED ORDER — ENSURE ENLIVE PO LIQD
237.0000 mL | Freq: Three times a day (TID) | ORAL | Status: DC
  Administered 2020-07-19 – 2020-07-21 (×2): 237 mL via ORAL

## 2020-07-19 NOTE — TOC Initial Note (Signed)
Transition of Care Emily Wu) - Initial/Assessment Note    Patient Details  Name: Emily Wu MRN: [id number] Date of Birth: [birth year]  Transition of Care Emily [facility]) CM/SW Contact:    Emily Ammons, RN Phone Number: 07/19/2020, 11:10 AM  Clinical Narrative:   RNCM placed call to patient's daughter Emily Wu to discuss discharge planning. Patient is from home and is normally independent. Daughter reports that they would strongly prefer for her to go home at discharge but that they will consider going to a facility. They would like to take her home with assistance but understands that it may be hard to find someone to come in to take care of her due to not testing positive until she came into the [facility]. Emily Wu reports that she will reach out to her other sister and discuss and then will get back to this RNCM.          Expected Discharge Plan: [place] Barriers to Discharge: No Barriers Identified   Patient Goals and CMS Choice     Choice offered to / list presented to : Patient  Expected Discharge Plan and Services Expected Discharge Plan: Emily Wu       Living arrangements for the past 2 months: Single Family Home                                      Prior Living Arrangements/Services Living arrangements for the past 2 months: Single Family Home Lives with:: Self Patient language and need for interpreter reviewed:: Yes        Need for Family Participation in Patient Care: Yes (Comment) Care giver support system in place?: Yes (comment)   Criminal Activity/Legal Involvement Pertinent to Current Situation/Hospitalization: No - Comment as needed  Activities of Daily Living Home Assistive Devices/Equipment: Wheelchair ADL Screening (condition at time of admission) Patient's cognitive ability adequate to safely complete daily activities?: Yes Is the patient deaf or have difficulty hearing?: No Does the patient have difficulty seeing,  even when wearing glasses/contacts?: No Does the patient have difficulty concentrating, remembering, or making decisions?: No Patient able to express need for assistance with ADLs?: Yes Does the patient have difficulty dressing or bathing?: No Independently performs ADLs?: Yes (appropriate for developmental age) Does the patient have difficulty walking or climbing stairs?: No Weakness of Legs: None Weakness of Arms/Hands: None  Permission Sought/Granted                  Emotional Assessment         Alcohol / Substance Use: Not Applicable Psych Involvement: No (comment)  Admission diagnosis:  COVID-19 virus detected [U07.1] COVID-19 [U07.1] COVID-19 virus infection [U07.1] Patient Active Problem List   Diagnosis Date Noted  . COVID-19 virus infection 07/17/2020  . COVID-19 07/16/2020  . Acute pain of left shoulder 06/19/2020  . Abnormal posture 04/26/2020  . Concussion 03/11/2020  . Widowed 03/11/2020  . Osteoarthritis of right knee 08/26/2019  . Orthostatic dizziness 08/18/2019  . S/P left unicompartmental knee replacement 01/26/2019  . Wu discharge follow-up 12/26/2018  . Incidental pulmonary nodule, > 71mm and < 48mm 12/26/2018  . Chronic kidney disease (CKD), stage III (moderate) 12/26/2018  . Left bundle branch block (LBBB) determined by electrocardiography 12/02/2018  . GERD (gastroesophageal reflux disease) 09/29/2018  . Anxiety disorder 03/25/2018  . Chronic pain of left knee 05/27/2016  . Left carotid bruit  04/14/2016  . Menopausal hot flushes 08/07/2015  . H/O malignant neoplasm of skin 06/25/2015  . Hypothyroidism 01/31/2015  . History of cervical cancer 01/10/2015  . Hyperlipidemia 02/03/2014  . Medicare annual wellness visit, subsequent 01/19/2014  . History of IBS 01/19/2014  . S/P breast augmentation 01/19/2014  . Primary localized osteoarthritis of left knee 07/06/2012  . Colon polyps 01/30/2012  . Low back pain 01/30/2012   PCP:  Crecencio Mc, MD Pharmacy:   [facility], [place] - [address] [place] [address] Phone: [phone] Fax: [phone]     Social Determinants of Health (SDOH) Interventions    Readmission Risk Interventions No flowsheet data found.

## 2020-07-19 NOTE — Progress Notes (Signed)
Pt A/Ox2 self and place, disoriented to situation and time. Continent of B/B. Upon assessment nurse writer observed pt tearful and crying. Pt denied pain or acute distress.  Skin warm, dry, and intact with no new s/s of impairment noted. Pt tolerated all scheduled medication well with no adverse reaction or side effects noted. Pt called out, staff took vital signs, and before staff could notify nurse of pt concerns, pt called 911 to report she was in pain and couldn't get any medication for pain. Nurse Probation officer was notified by charge nurse and CNA that patient wanted something for pain, pt was educated that she didn't give staff time to notify nurse, and Economist assessed pain. Pt reported pain in RLQ rated 5/10, medicated x1 with prn Tylenol 650 mg, effective within one hour. Pt has a flat and sad effect. Medicated x1 with prn Tussionex for cough, effective within one hour.

## 2020-07-19 NOTE — Progress Notes (Signed)
[facility] Progress Note  Patient: Emily Wu    ERX:[id number]  DOA: 07/16/2020     Date of Service: the patient was seen and examined on 07/19/2020  Brief hospital course: Past medical history of HTN, HLD, OA.  Presents with complaints of body ache and weakness and diarrhea and found to have COVID-19 illness. Currently plan is continue hydration.  Assessment and Plan: 1. Acute COVID-19 Viral infection CXR: no bilateral peripheral opacities Tmax last 24 hours:  Temp (24hrs), Avg:98.2 F (36.8 C), Min:97.5 F (36.4 C), Max:98.9 F (37.2 C)  Oxygen requirements: On room air CRP: 1.0. Remdesivir: Started on 07/16/2020 Steroids: Currently not indicated given lack of hypoxia Baricitinib/Actemra(off-label use): Currently not indicated The investigational nature of this medication was discussed with the patient/HCPOA and they choose to proceed as the potential benefits are felt to outweigh risks at this time.  Monoclonal antibody received on admission Antibiotics: None Vitamin C and Zinc: Continue DVT Prophylaxis: enoxaparin (LOVENOX) injection 40 mg Start: 07/16/20 2230    Prone positioning: Patient encouraged to stay in prone position as much as possible.  The treatment plan and use of medications and known side effects were discussed with patient/family. It was clearly explained that Complete risks and long-term side effects are unknown, however in the best clinical judgment they seem to be of some clinical benefit rather than medical risks. Patient/family agree with the treatment plan and want to receive these treatments as indicated.   2.  Hypokalemia. Being replaced.  Monitor.  3.  Hyperlipidemia Continue statin  4.  Depression Stable. Currently continue Lexapro  5.  Vitamin B12 deficiency Continue replacement  6.  Pulmonary nodule Repeat CT scan chest x-ray outpatient once better from COVID-19.  7.  Anorexia In the setting of COVID-19 illness. Monitor.  Change diet to regular diet.  8.  Hypothyroidism Continuing Cytomel  9. Diarrhea. Right lower quadrant abdominal pain Currently resolved. No further work-up. X-ray abdomen negative. No tenderness on my exam.   10. Acute metabolic encephalopathy. Likely delirium in the setting of COVID-19 pneumonia as well as hospitalization. No focal deficit  Diet: Regular diet DVT Prophylaxis:   enoxaparin (LOVENOX) injection 40 mg Start: 07/16/20 2230    Advance goals of care discussion: Full code  Family Communication: no family was present at bedside, at the time of interview. Discussed with daughter on the phone.  Disposition:  Status is: Inpatient  Remains inpatient appropriate because:Hemodynamically unstable   Dispo: The patient is from: Home              Anticipated d/c is to: SNF              Anticipated d/c date is: 3 days              Patient currently is not medically stable to d/c.  Subjective: Feels stronger but still fatigue and tired. No nausea no vomiting. Overnight had some confusion. Also reported abdominal pain. Physical Exam:  General: Appear in mild distress, no Rash; Oral Mucosa Clear, moist. no Abnormal Neck Mass Or lumps, Conjunctiva normal  Cardiovascular: S1 and S2 Present, no Murmur, Respiratory: good respiratory effort, Bilateral Air entry present and Clear to Auscultation, no Crackles, no wheezes Abdomen: Bowel Sound present, Soft and no tenderness Extremities: no Pedal edema, no calf tenderness Neurology: alert and oriented to time, place, and person affect appropriate. no new focal deficit Gait not checked due to patient safety concerns  Vitals:   07/19/20 0104 07/19/20 0544 07/19/20 0848  07/19/20 1219  BP: (!) 106/36 (!) 119/48 (!) 121/58 (!) 130/56  Pulse: 63 (!) 55 (!) 56 61  Resp: 16 16 17    Temp: 98.5 F (36.9 C) (!) 97.5 F (36.4 C) 97.8 F (36.6 C) 98.4 F (36.9 C)  TempSrc: Oral Oral Oral Oral  SpO2: 98% 98% 97% 97%  Weight:       Height:        Intake/Output Summary (Last 24 hours) at 07/19/2020 1849 Last data filed at 07/19/2020 4982 Gross per 24 hour  Intake 819.93 ml  Output 1850 ml  Net -1030.07 ml   Filed Weights   07/16/20 0825  Weight: 50.3 kg    Data Reviewed: I have personally reviewed and interpreted daily labs, tele strips, imagings as discussed above. I reviewed all nursing notes, pharmacy notes, vitals, pertinent old records I have discussed plan of care as described above with RN and patient/family.  CBC: Recent Labs  Lab 07/16/20 0815 07/17/20 0450 07/18/20 0726 07/19/20 0635  WBC 6.2 5.5 2.9* 4.3  NEUTROABS  --  2.9 0.4* 1.1*  HGB 14.2 12.6 12.6 11.9*  HCT 43.5 38.0 38.8 34.9*  MCV 92.9 92.2 92.8 90.2  PLT 236 187 179 641   Basic Metabolic Panel: Recent Labs  Lab 07/16/20 0815 07/17/20 0450 07/18/20 0726 07/19/20 0635  NA 136 137 137 141  K 3.3* 3.9 3.9 4.1  CL 98 104 106 110  CO2 24 25 22 23   GLUCOSE 107* 90 101* 101*  BUN 13 15 17 16   CREATININE 0.99 0.87 0.94 0.80  CALCIUM 8.6* 7.7* 8.1* 8.2*  MG  --   --   --  2.0    Studies: DG Abd Portable 1V  Result Date: 07/19/2020 CLINICAL DATA:  Body aches, weakness, diarrhea, COVID-19; past history hypertension, hyperlipidemia EXAM: PORTABLE ABDOMEN - 1 VIEW COMPARISON:  Portable exam 0809 hours compared to 03/28/2009 FINDINGS: Surgical clips RIGHT upper quadrant likely reflect cholecystectomy. Nonobstructive bowel gas pattern. Scattered gas in nondistended large and small bowel loops. No bowel dilatation or bowel wall thickening. Osseous structures unremarkable. IMPRESSION: Nonspecific bowel gas pattern Electronically Signed   By: Lavonia Dana M.D.   On: 07/19/2020 09:39    Scheduled Meds: . vitamin C  500 mg Oral Daily  . aspirin EC  81 mg Oral Daily  . atorvastatin  20 mg Oral Daily  . Chlorhexidine Gluconate Cloth  6 each Topical Daily  . cholecalciferol  1,000 Units Oral Daily  . enoxaparin (LOVENOX) injection  40 mg  Subcutaneous Q24H  . famotidine  20 mg Oral BID  . feeding supplement (ENSURE ENLIVE)  237 mL Oral TID BM  . guaiFENesin  600 mg Oral BID  . liothyronine  10 mcg Oral Daily  . multivitamin with minerals  1 tablet Oral Daily  . vitamin B-12  1,000 mcg Oral Daily  . zinc sulfate  220 mg Oral Daily   Continuous Infusions: . dextrose 5% lactated ringers 50 mL/hr at 07/19/20 1532  . remdesivir 100 mg in NS 100 mL 100 mg (07/19/20 1212)   PRN Meds: acetaminophen, chlorpheniramine-HYDROcodone, guaiFENesin-dextromethorphan, magnesium hydroxide, ondansetron **OR** ondansetron (ZOFRAN) IV, traZODone  Time spent: 35 minutes  Author: Berle Mull, MD [facility] 07/19/2020 6:49 PM  To reach On-call, see care teams to locate the attending and reach out via [email or web address]. Between 7PM-7AM, please contact night-coverage If you still have difficulty reaching the attending provider, please page the [facility] (Director on Call) for [facility] on amion for  assistance.

## 2020-07-20 LAB — CBC WITH DIFFERENTIAL/PLATELET
Abs Immature Granulocytes: 0.01 10*3/uL (ref 0.00–0.07)
Basophils Absolute: 0 10*3/uL (ref 0.0–0.1)
Basophils Relative: 0 %
Eosinophils Absolute: 0.1 10*3/uL (ref 0.0–0.5)
Eosinophils Relative: 1 %
HCT: 38.5 % (ref 36.0–46.0)
Hemoglobin: 13.1 g/dL (ref 12.0–15.0)
Immature Granulocytes: 0 %
Lymphocytes Relative: 48 %
Lymphs Abs: 2.2 10*3/uL (ref 0.7–4.0)
MCH: 30.4 pg (ref 26.0–34.0)
MCHC: 34 g/dL (ref 30.0–36.0)
MCV: 89.3 fL (ref 80.0–100.0)
Monocytes Absolute: 0.6 10*3/uL (ref 0.1–1.0)
Monocytes Relative: 14 %
Neutro Abs: 1.7 10*3/uL (ref 1.7–7.7)
Neutrophils Relative %: 37 %
Platelets: 217 10*3/uL (ref 150–400)
RBC: 4.31 MIL/uL (ref 3.87–5.11)
RDW: 14.3 % (ref 11.5–15.5)
Smear Review: NORMAL
WBC: 4.7 10*3/uL (ref 4.0–10.5)
nRBC: 0 % (ref 0.0–0.2)

## 2020-07-20 LAB — COMPREHENSIVE METABOLIC PANEL
ALT: 26 U/L (ref 0–44)
AST: 42 U/L — ABNORMAL HIGH (ref 15–41)
Albumin: 2.9 g/dL — ABNORMAL LOW (ref 3.5–5.0)
Alkaline Phosphatase: 56 U/L (ref 38–126)
Anion gap: 9 (ref 5–15)
BUN: 12 mg/dL (ref 8–23)
CO2: 28 mmol/L (ref 22–32)
Calcium: 8.6 mg/dL — ABNORMAL LOW (ref 8.9–10.3)
Chloride: 105 mmol/L (ref 98–111)
Creatinine, Ser: 0.74 mg/dL (ref 0.44–1.00)
GFR calc Af Amer: >60 mL/min (ref >60)
GFR calc non Af Amer: >60 mL/min (ref >60)
Glucose, Bld: 108 mg/dL — ABNORMAL HIGH (ref 70–99)
Potassium: 4 mmol/L (ref 3.5–5.1)
Sodium: 142 mmol/L (ref 135–145)
Total Bilirubin: 0.8 mg/dL (ref 0.3–1.2)
Total Protein: 5.7 g/dL — ABNORMAL LOW (ref 6.5–8.1)

## 2020-07-20 LAB — FERRITIN: Ferritin: 360 ng/mL — ABNORMAL HIGH (ref 11–307)

## 2020-07-20 LAB — FIBRIN DERIVATIVES D-DIMER (ARMC ONLY): Fibrin derivatives D-dimer (ARMC): 956.94 ng/mL (FEU) — ABNORMAL HIGH (ref 0.00–499.00)

## 2020-07-20 LAB — C-REACTIVE PROTEIN: CRP: 0.8 mg/dL (ref <1.0)

## 2020-07-20 MED ORDER — MIRTAZAPINE 15 MG PO TABS
7.5000 mg | ORAL_TABLET | Freq: Every day | ORAL | Status: DC
  Administered 2020-07-20 – 2020-07-22 (×3): 7.5 mg via ORAL
  Filled 2020-07-20 (×3): qty 1

## 2020-07-20 MED ORDER — FAMOTIDINE 20 MG PO TABS
20.0000 mg | ORAL_TABLET | Freq: Every day | ORAL | Status: DC
  Administered 2020-07-21 – 2020-07-28 (×8): 20 mg via ORAL
  Filled 2020-07-20 (×8): qty 1

## 2020-07-20 NOTE — TOC Progression Note (Signed)
Transition of Care [facility]) - Progression Note    Patient Details  Name: Emily Wu MRN: [id number] Date of Birth: May 16, 1931  Transition of Care [facility]) CM/SW [place], RN Phone Number: 07/20/2020, 1:33 PM  Clinical Narrative:   RNCM received return call from patient's daughter [name] to report that she and her sister after discussion were agreeable to a search for beds for skilled nursing placement. [name] reports that it is still her wish to take patient home with assistance in the home and it will likely be there plan to do that as soon as her quarantine period is over. [name] reports that they do not want [facility], [facility], or [facility] [employer] RNCM completed PASSR, [place]-2 and initiated bed search. RNCM will follow and present beds when available.     Expected Discharge Plan: [place] Barriers to Discharge: No Barriers Identified  Expected Discharge Plan and Services Expected Discharge Plan: [place] arrangements for the past 2 months: Single Family Home                                       Social Determinants of Health (SDOH) Interventions    Readmission Risk Interventions No flowsheet data found.

## 2020-07-20 NOTE — Progress Notes (Addendum)
[facility] Progress Note  Patient: Emily Wu    EVO:[id number]  DOA: 07/16/2020     Date of Service: the patient was seen and examined on 07/20/2020  Brief hospital course: Past medical history of HTN, HLD, OA.  Presents with complaints of body ache and weakness and diarrhea and found to have COVID-19 illness. Currently plan is continue monitor improvement in oral intake.  Assessment and Plan: 1. Acute COVID-19 Viral infection CXR: no bilateral peripheral opacities Tmax last 24 hours:  Temp (24hrs), Avg:98.2 F (36.8 C), Min:97.9 F (36.6 C), Max:98.4 F (36.9 C)  Oxygen requirements: On room air CRP: Normal Remdesivir: Started on 07/16/2020 Steroids: Currently not indicated given lack of hypoxia Baricitinib/Actemra(off-label use): Currently not indicated The investigational nature of this medication was discussed with the patient/HCPOA and they choose to proceed as the potential benefits are felt to outweigh risks at this time.  Monoclonal antibody received on admission Antibiotics: None Vitamin C and Zinc: Continue DVT Prophylaxis: enoxaparin (LOVENOX) injection 40 mg Start: 07/16/20 2230    Prone positioning: Patient encouraged to stay in prone position as much as possible.  The treatment plan and use of medications and known side effects were discussed with patient/family. It was clearly explained that Complete risks and long-term side effects are unknown, however in the best clinical judgment they seem to be of some clinical benefit rather than medical risks. Patient/family agree with the treatment plan and want to receive these treatments as indicated.   2.  Hypokalemia. Being replaced.  Monitor.  3.  Hyperlipidemia Continue statin  4.  Depression Stable. Currently continue Lexapro, Remeron added.  Will monitor response.  5.  Vitamin B12 deficiency Continue replacement  6.  Pulmonary nodule Repeat CT scan chest x-ray outpatient once better from  COVID-19.  7.  Anorexia In the setting of COVID-19 illness. Monitor. Change diet to regular diet. Add Remeron.  8.  Hypothyroidism Continuing Cytomel   Diet: Regular diet DVT Prophylaxis:   enoxaparin (LOVENOX) injection 40 mg Start: 07/16/20 2230    Advance goals of care discussion: Full code  Family Communication: no family was present at bedside, at the time of interview.   Disposition:  Status is: Inpatient  Remains inpatient appropriate because:Hemodynamically unstable   Dispo: The patient is from: Home              Anticipated d/c is to: SNF              Anticipated d/c date is: 3 days              Patient currently is not medically stable to d/c.        Subjective: Minimal oral intake.  Remains for fatigue and tired.  No nausea no vomiting.  Physical Exam:  General: Appear in mild distress, no Rash; Oral Mucosa Clear, moist. no Abnormal Neck Mass Or lumps, Conjunctiva normal  Cardiovascular: S1 and S2 Present, no Murmur, Respiratory: good respiratory effort, Bilateral Air entry present and Clear to Auscultation, no Crackles, no wheezes Abdomen: Bowel Sound present, Soft and no tenderness Extremities: no Pedal edema, no calf tenderness Neurology: alert and oriented to time, place, and person affect appropriate. no new focal deficit Gait not checked due to patient safety concerns  Vitals:   07/20/20 0400 07/20/20 0733 07/20/20 1138 07/20/20 1709  BP: 124/76 (!) 156/60 (!) 142/64 (!) 131/59  Pulse: 66 61 67 65  Resp:  17 19 17   Temp: 98.1 F (36.7 C) 97.9 F (36.6  C) 98.3 F (36.8 C) 98.4 F (36.9 C)  TempSrc: Oral Oral Oral Oral  SpO2: 99% 96% 100% 96%  Weight:      Height:        Intake/Output Summary (Last 24 hours) at 07/20/2020 2036 Last data filed at 07/20/2020 7654 Gross per 24 hour  Intake --  Output 500 ml  Net -500 ml   Filed Weights   07/16/20 0825  Weight: 50.3 kg    Data Reviewed: I have personally reviewed and interpreted  daily labs, tele strips, imagings as discussed above. I reviewed all nursing notes, pharmacy notes, vitals, pertinent old records I have discussed plan of care as described above with RN and patient/family.  CBC: Recent Labs  Lab 07/16/20 0815 07/17/20 0450 07/18/20 0726 07/19/20 0635 07/20/20 0448  WBC 6.2 5.5 2.9* 4.3 4.7  NEUTROABS  --  2.9 0.4* 1.1* 1.7  HGB 14.2 12.6 12.6 11.9* 13.1  HCT 43.5 38.0 38.8 34.9* 38.5  MCV 92.9 92.2 92.8 90.2 89.3  PLT 236 187 179 183 650   Basic Metabolic Panel: Recent Labs  Lab 07/16/20 0815 07/17/20 0450 07/18/20 0726 07/19/20 0635 07/20/20 0448  NA 136 137 137 141 142  K 3.3* 3.9 3.9 4.1 4.0  CL 98 104 106 110 105  CO2 24 25 22 23 28   GLUCOSE 107* 90 101* 101* 108*  BUN 13 15 17 16 12   CREATININE 0.99 0.87 0.94 0.80 0.74  CALCIUM 8.6* 7.7* 8.1* 8.2* 8.6*  MG  --   --   --  2.0  --     Studies: No results found.  Scheduled Meds: . vitamin C  500 mg Oral Daily  . aspirin EC  81 mg Oral Daily  . atorvastatin  20 mg Oral Daily  . cholecalciferol  1,000 Units Oral Daily  . enoxaparin (LOVENOX) injection  40 mg Subcutaneous Q24H  . [START ON 07/21/2020] famotidine  20 mg Oral Daily  . feeding supplement (ENSURE ENLIVE)  237 mL Oral TID BM  . guaiFENesin  600 mg Oral BID  . liothyronine  10 mcg Oral Daily  . multivitamin with minerals  1 tablet Oral Daily  . vitamin B-12  1,000 mcg Oral Daily  . zinc sulfate  220 mg Oral Daily   Continuous Infusions:  PRN Meds: acetaminophen, chlorpheniramine-HYDROcodone, guaiFENesin-dextromethorphan, magnesium hydroxide, ondansetron **OR** ondansetron (ZOFRAN) IV, traZODone  Time spent: 35 minutes  Author: Berle Mull, MD [facility] 07/20/2020 8:36 PM  To reach On-call, see care teams to locate the attending and reach out via [email or web address]. Between 7PM-7AM, please contact night-coverage If you still have difficulty reaching the attending provider, please page the [facility] (Director on  Call) for [facility] on amion for assistance.

## 2020-07-20 NOTE — NC FL2 (Signed)
[place] LEVEL OF CARE SCREENING TOOL     IDENTIFICATION  Patient Name: Emily Wu Birthdate: 10-25-1931 Sex: female Admission Date (Current Location): 07/16/2020  [place] and [place] Number:  Engineering geologist and Address:  [facility], [address] [place], [place] [address]      Provider Number: [id number]  Attending Physician Name and Address:  Lavina Hamman, MD  Relative Name and Phone Number:  Darrel Reach [phone]    Current Level of Care: Hospital Recommended Level of Care: [place] Prior Approval Number:    Date Approved/Denied:   PASRR Number: [id number]  Discharge Plan: SNF    Current Diagnoses: Patient Active Problem List   Diagnosis Date Noted  . COVID-19 virus infection 07/17/2020  . COVID-19 07/16/2020  . Acute pain of left shoulder 06/19/2020  . Abnormal posture 04/26/2020  . Concussion 03/11/2020  . Widowed 03/11/2020  . Osteoarthritis of right knee 08/26/2019  . Orthostatic dizziness 08/18/2019  . S/P left unicompartmental knee replacement 01/26/2019  . Hospital discharge follow-up 12/26/2018  . Incidental pulmonary nodule, > 53mm and < 24mm 12/26/2018  . Chronic kidney disease (CKD), stage III (moderate) 12/26/2018  . Left bundle branch block (LBBB) determined by electrocardiography 12/02/2018  . GERD (gastroesophageal reflux disease) 09/29/2018  . Anxiety disorder 03/25/2018  . Chronic pain of left knee 05/27/2016  . Left carotid bruit 04/14/2016  . Menopausal hot flushes 08/07/2015  . H/O malignant neoplasm of skin 06/25/2015  . Hypothyroidism 01/31/2015  . History of cervical cancer 01/10/2015  . Hyperlipidemia 02/03/2014  . Medicare annual wellness visit, subsequent 01/19/2014  . History of IBS 01/19/2014  . S/P breast augmentation 01/19/2014  . Primary localized osteoarthritis of left knee 07/06/2012  . Colon polyps 01/30/2012  . Low back pain 01/30/2012     Orientation RESPIRATION BLADDER Height & Weight     Self, Place  Normal External catheter Weight: 50.3 kg Height:  5\' 2"  (157.5 cm)  BEHAVIORAL SYMPTOMS/MOOD NEUROLOGICAL BOWEL NUTRITION STATUS      Continent Diet (Regular)  AMBULATORY STATUS COMMUNICATION OF NEEDS Skin   Limited Assist Verbally Normal                       Personal Care Assistance Level of Assistance  Bathing, Feeding, Dressing Bathing Assistance: Limited assistance Feeding assistance: Independent Dressing Assistance: Limited assistance     Functional Limitations Info  Sight, Hearing, Speech Sight Info: Adequate Hearing Info: Adequate Speech Info: Adequate    SPECIAL CARE FACTORS FREQUENCY  PT (By licensed PT), OT (By licensed OT)                    Contractures Contractures Info: Not present    Additional Factors Info  Code Status, Allergies Code Status Info: Full Allergies Info: Tramadol, Promethazine, Amoxicillin, Sulfa           Current Medications (07/20/2020):  This is the current hospital active medication list Current Facility-Administered Medications  Medication Dose Route Frequency Provider Last Rate Last Admin  . acetaminophen (TYLENOL) tablet 650 mg  650 mg Oral Q6H PRN Mansy, Jan A, MD   650 mg at 07/20/20 0914  . ascorbic acid (VITAMIN C) tablet 500 mg  500 mg Oral Daily Mansy, Jan A, MD   500 mg at 07/20/20 0913  . aspirin EC tablet 81 mg  81 mg Oral Daily Mansy, Jan A, MD   81 mg at 07/20/20 0916  .  atorvastatin (LIPITOR) tablet 20 mg  20 mg Oral Daily Mansy, Jan A, MD   20 mg at 07/19/20 2018  . chlorpheniramine-HYDROcodone (TUSSIONEX) 10-8 MG/5ML suspension 5 mL  5 mL Oral Q12H PRN Mansy, Jan A, MD   5 mL at 07/19/20 0537  . cholecalciferol (VITAMIN D3) tablet 1,000 Units  1,000 Units Oral Daily Mansy, Arvella Merles, MD   1,000 Units at 07/20/20 0914  . enoxaparin (LOVENOX) injection 40 mg  40 mg Subcutaneous Q24H Mansy, Jan A, MD   40 mg at 07/19/20 2019  . famotidine  (PEPCID) tablet 20 mg  20 mg Oral BID Mansy, Jan A, MD   20 mg at 07/20/20 0914  . feeding supplement (ENSURE ENLIVE) (ENSURE ENLIVE) liquid 237 mL  237 mL Oral TID BM Lavina Hamman, MD   237 mL at 07/19/20 2021  . guaiFENesin (MUCINEX) 12 hr tablet 600 mg  600 mg Oral BID Mansy, Jan A, MD   600 mg at 07/20/20 0915  . guaiFENesin-dextromethorphan (ROBITUSSIN DM) 100-10 MG/5ML syrup 10 mL  10 mL Oral Q4H PRN Mansy, Jan A, MD      . liothyronine (CYTOMEL) tablet 10 mcg  10 mcg Oral Daily Mansy, Jan A, MD   10 mcg at 07/20/20 0913  . magnesium hydroxide (MILK OF MAGNESIA) suspension 30 mL  30 mL Oral Daily PRN Mansy, Jan A, MD      . multivitamin with minerals tablet 1 tablet  1 tablet Oral Daily Mansy, Jan A, MD   1 tablet at 07/20/20 0914  . ondansetron (ZOFRAN) tablet 4 mg  4 mg Oral Q6H PRN Mansy, Jan A, MD       Or  . ondansetron [facility]) injection 4 mg  4 mg Intravenous Q6H PRN Mansy, Jan A, MD      . traZODone (DESYREL) tablet 25 mg  25 mg Oral QHS PRN Mansy, Jan A, MD   25 mg at 07/19/20 2019  . vitamin B-12 (CYANOCOBALAMIN) tablet 1,000 mcg  1,000 mcg Oral Daily Mansy, Jan A, MD   1,000 mcg at 07/20/20 4765  . zinc sulfate capsule 220 mg  220 mg Oral Daily Mansy, Jan A, MD   220 mg at 07/20/20 0915     Discharge Medications: Please see discharge summary for a list of discharge medications.  Relevant Imaging Results:  Relevant Lab Results:   Additional Information SS# [id number]  Shelbie Ammons, RN

## 2020-07-21 ENCOUNTER — Inpatient Hospital Stay: Payer: Medicare Other

## 2020-07-21 LAB — CBC WITH DIFFERENTIAL/PLATELET
Abs Immature Granulocytes: 0.02 10*3/uL (ref 0.00–0.07)
Basophils Absolute: 0 10*3/uL (ref 0.0–0.1)
Basophils Relative: 0 %
Eosinophils Absolute: 0.1 10*3/uL (ref 0.0–0.5)
Eosinophils Relative: 1 %
HCT: 39.1 % (ref 36.0–46.0)
Hemoglobin: 13.4 g/dL (ref 12.0–15.0)
Immature Granulocytes: 0 %
Lymphocytes Relative: 23 %
Lymphs Abs: 1.8 10*3/uL (ref 0.7–4.0)
MCH: 30.5 pg (ref 26.0–34.0)
MCHC: 34.3 g/dL (ref 30.0–36.0)
MCV: 88.9 fL (ref 80.0–100.0)
Monocytes Absolute: 0.9 10*3/uL (ref 0.1–1.0)
Monocytes Relative: 12 %
Neutro Abs: 5.1 10*3/uL (ref 1.7–7.7)
Neutrophils Relative %: 64 %
Platelets: 257 10*3/uL (ref 150–400)
RBC: 4.4 MIL/uL (ref 3.87–5.11)
RDW: 14.1 % (ref 11.5–15.5)
WBC: 7.9 10*3/uL (ref 4.0–10.5)
nRBC: 0 % (ref 0.0–0.2)

## 2020-07-21 LAB — COMPREHENSIVE METABOLIC PANEL
ALT: 25 U/L (ref 0–44)
AST: 34 U/L (ref 15–41)
Albumin: 3 g/dL — ABNORMAL LOW (ref 3.5–5.0)
Alkaline Phosphatase: 61 U/L (ref 38–126)
Anion gap: 6 (ref 5–15)
BUN: 15 mg/dL (ref 8–23)
CO2: 31 mmol/L (ref 22–32)
Calcium: 8 mg/dL — ABNORMAL LOW (ref 8.9–10.3)
Chloride: 104 mmol/L (ref 98–111)
Creatinine, Ser: 0.77 mg/dL (ref 0.44–1.00)
GFR calc Af Amer: >60 mL/min (ref >60)
GFR calc non Af Amer: >60 mL/min (ref >60)
Glucose, Bld: 105 mg/dL — ABNORMAL HIGH (ref 70–99)
Potassium: 3.6 mmol/L (ref 3.5–5.1)
Sodium: 141 mmol/L (ref 135–145)
Total Bilirubin: 0.8 mg/dL (ref 0.3–1.2)
Total Protein: 5.7 g/dL — ABNORMAL LOW (ref 6.5–8.1)

## 2020-07-21 LAB — C-REACTIVE PROTEIN: CRP: 1 mg/dL — ABNORMAL HIGH (ref <1.0)

## 2020-07-21 LAB — FIBRIN DERIVATIVES D-DIMER (ARMC ONLY): Fibrin derivatives D-dimer (ARMC): 841.89 ng/mL (FEU) — ABNORMAL HIGH (ref 0.00–499.00)

## 2020-07-21 LAB — FERRITIN: Ferritin: 338 ng/mL — ABNORMAL HIGH (ref 11–307)

## 2020-07-21 MED ORDER — SENNOSIDES-DOCUSATE SODIUM 8.6-50 MG PO TABS: 1.0000 | ORAL_TABLET | Freq: Two times a day (BID) | ORAL | Status: DC

## 2020-07-21 MED ORDER — SIMETHICONE 80 MG PO CHEW
80.0000 mg | CHEWABLE_TABLET | Freq: Four times a day (QID) | ORAL | Status: DC | PRN
  Filled 2020-07-21: qty 1

## 2020-07-21 MED ORDER — DRONABINOL 2.5 MG PO CAPS
2.5000 mg | ORAL_CAPSULE | Freq: Two times a day (BID) | ORAL | Status: DC
  Administered 2020-07-21 – 2020-07-26 (×10): 2.5 mg via ORAL
  Filled 2020-07-21 (×9): qty 1

## 2020-07-21 NOTE — Progress Notes (Deleted)
[facility] Progress Note  Patient: Emily Wu    TSV:[id number]  DOA: 07/16/2020     Date of Service: the patient was seen and examined on 07/21/2020  Brief hospital course: Past medical history of HTN, HLD, OA.  Presents with complaints of body ache and weakness and diarrhea and found to have COVID-19 illness. Currently plan is continue monitor improvement in oral intake and engage with family regarding goals of care conversation.  Assessment and Plan: 1. Acute COVID-19 Viral infection CXR: no bilateral peripheral opacities Oxygen requirements: On room air CRP: Normal Remdesivir: Started on 07/16/2020 Steroids: Currently not indicated given lack of hypoxia Baricitinib/Actemra(off-label use): Currently not indicated The investigational nature of this medication was discussed with the patient/HCPOA and they choose to proceed as the potential benefits are felt to outweigh risks at this time.  Monoclonal antibody received on admission Antibiotics: None Vitamin C and Zinc: Continue DVT Prophylaxis: enoxaparin (LOVENOX) injection 40 mg Start: 07/16/20 2230  Prone positioning: Patient encouraged to stay in prone position as much as possible.  The treatment plan and use of medications and known side effects were discussed with patient/family. It was clearly explained that Complete risks and long-term side effects are unknown, however in the best clinical judgment they seem to be of some clinical benefit rather than medical risks. Patient/family agree with the treatment plan and want to receive these treatments as indicated.   2.  Hypokalemia. replaced.  Monitor.  3.  Hyperlipidemia Continue statin  4.  Depression Appears to be withdrawn.  New medications adjusted. Currently continue Lexapro, Remeron added.  Will monitor response.  5.  Vitamin B12 deficiency Continue replacement  6.  Pulmonary nodule Repeat CT scan outpatient once better from COVID-19.  7.  Anorexia In  the setting of COVID-19 illness. Monitor. Change diet to regular diet. Add Remeron. X-ray abdomen negative for any acute abnormality.  8.  Hypothyroidism Continuing Cytomel  9.  Goals of care conversation. Despite being on regular diet, Marinol, Remeron patient has poor p.o. intake. X-ray shows no evidence of obstruction.  Patient does not have any nausea or vomiting.  Patient reportedly does not have any anosmia. Patient's husband passed away a few months ago and appears to have withdrawn since then. At present concern is will have poor prognosis. Multiple attempts to reach the family were not successful on 07/22/2020. Palliative care consulted.  Diet: Regular diet DVT Prophylaxis:   enoxaparin (LOVENOX) injection 40 mg Start: 07/16/20 2230   Advance goals of care discussion: Full code  Family Communication: no family was present at bedside, at the time of interview.  Attempt to reach 2 daughters were not successful.  Unable to leave voicemail.  Disposition:  Status is: Inpatient  Remains inpatient appropriate because: Not eating or drinking.  Appears to be withdrawn.  Poor prognosis.   Dispo: The patient is from: Home              Anticipated d/c is to: SNF              Anticipated d/c date is: 3 days              Patient currently is not medically stable to d/c.   Subjective: crying in pain on right leg, appears withdrawn. No nausea or acute complains   Physical Exam:  General: Appear in mild distress, no Rash; Oral Mucosa Clear, moist. no Abnormal Neck Mass Or lumps, Conjunctiva normal  Cardiovascular: S1 and S2 Present, no Murmur, Respiratory: good  respiratory effort, Bilateral Air entry present and Clear to Auscultation, no Crackles, no wheezes Abdomen: Bowel Sound present, Soft and no tenderness Extremities: no Pedal edema, no calf tenderness Neurology: alert and oriented to time, place, and person affect appropriate. no new focal deficit Gait not checked due to  patient safety concerns  Vitals:   07/20/20 1709 07/20/20 2052 07/21/20 0900 07/21/20 1612  BP: (!) 131/59 (!) 130/55 117/80 (!) 138/52  Pulse: 65 68 77 75  Resp: 17 16  16   Temp: 98.4 F (36.9 C) 98.2 F (36.8 C) 97.6 F (36.4 C) 98.9 F (37.2 C)  TempSrc: Oral Oral Oral Oral  SpO2: 96% 95% 97% 99%  Weight:      Height:       No intake or output data in the 24 hours ending 07/21/20 1807 Filed Weights   07/16/20 0825  Weight: 50.3 kg    Data Reviewed: I have personally reviewed and interpreted daily labs, tele strips, imagings as discussed above. I reviewed all nursing notes, pharmacy notes, vitals, pertinent old records I have discussed plan of care as described above with RN and patient/family.  CBC: Recent Labs  Lab 07/17/20 0450 07/18/20 0726 07/19/20 0635 07/20/20 0448 07/21/20 0404  WBC 5.5 2.9* 4.3 4.7 7.9  NEUTROABS 2.9 0.4* 1.1* 1.7 5.1  HGB 12.6 12.6 11.9* 13.1 13.4  HCT 38.0 38.8 34.9* 38.5 39.1  MCV 92.2 92.8 90.2 89.3 88.9  PLT 187 179 183 217 644   Basic Metabolic Panel: Recent Labs  Lab 07/17/20 0450 07/18/20 0726 07/19/20 0635 07/20/20 0448 07/21/20 0404  NA 137 137 141 142 141  K 3.9 3.9 4.1 4.0 3.6  CL 104 106 110 105 104  CO2 25 22 23 28 31   GLUCOSE 90 101* 101* 108* 105*  BUN 15 17 16 12 15   CREATININE 0.87 0.94 0.80 0.74 0.77  CALCIUM 7.7* 8.1* 8.2* 8.6* 8.0*  MG  --   --  2.0  --   --     Studies: DG Abd Portable 1V  Result Date: 07/21/2020 CLINICAL DATA:  Constipation. EXAM: PORTABLE ABDOMEN - 1 VIEW COMPARISON:  July 19, 2020 FINDINGS: Nonobstructive bowel gas pattern. No evidence of constipation. No other acute abnormalities. IMPRESSION: Negative. Electronically Signed   By: Dorise Bullion III M.D   On: 07/21/2020 16:32    Scheduled Meds: . vitamin C  500 mg Oral Daily  . aspirin EC  81 mg Oral Daily  . atorvastatin  20 mg Oral Daily  . cholecalciferol  1,000 Units Oral Daily  . dronabinol  2.5 mg Oral BID AC  .  enoxaparin (LOVENOX) injection  40 mg Subcutaneous Q24H  . famotidine  20 mg Oral Daily  . feeding supplement (ENSURE ENLIVE)  237 mL Oral TID BM  . guaiFENesin  600 mg Oral BID  . liothyronine  10 mcg Oral Daily  . mirtazapine  7.5 mg Oral QHS  . multivitamin with minerals  1 tablet Oral Daily  . vitamin B-12  1,000 mcg Oral Daily  . zinc sulfate  220 mg Oral Daily   Continuous Infusions:  PRN Meds: acetaminophen, chlorpheniramine-HYDROcodone, guaiFENesin-dextromethorphan, magnesium hydroxide, ondansetron **OR** ondansetron (ZOFRAN) IV, simethicone, traZODone  Time spent: 35 minutes  Author: Berle Mull, MD [facility] 07/21/2020 6:07 PM  To reach On-call, see care teams to locate the attending and reach out via [email or web address]. Between 7PM-7AM, please contact night-coverage If you still have difficulty reaching the attending provider, please page the [facility] (Director on  Call) for [facility] on amion for assistance.

## 2020-07-21 NOTE — Progress Notes (Signed)
Patient has been refusing food all day as well as Ensure drinks. Brought Magic Cup ice cream and she refused that after one bite as well. She drinks water in small sips but refusing all other protein drinks and food. I asked her what sounds good and she repeatedly says "nothing".

## 2020-07-22 ENCOUNTER — Inpatient Hospital Stay: Payer: Medicare Other

## 2020-07-22 NOTE — Progress Notes (Signed)
Pt c/o of pain and insomnia around 0130 this am.  PRN Tylenol and Trazodone administered.  At that time, pt was extremely tearful and stated "I wish I was dead."  She denied having any thoughts or plan of suicide.  She would not elaborate on why she wishes to be dead, she just covered her face and head with her blanket.    Nurse and NT performed frequent rounds on pt.  PRN meds were effective, pt fell asleep around 0230 and she remains asleep at this time.

## 2020-07-22 NOTE — Progress Notes (Signed)
[facility] Progress Note  Patient: Emily Wu    MPN:[id number]  DOA: 07/16/2020     Date of Service: the patient was seen and examined on 07/22/2020  Brief hospital course: Past medical history of HTN, HLD, OA.  Presents with complaints of body ache and weakness and diarrhea and found to have COVID-19 illness. Currently plan is continue monitor improvement in oral intake and engage with family regarding goals of care conversation.  Assessment and Plan: 1. Acute COVID-19 Viral infection CXR: no bilateral peripheral opacities Oxygen requirements: On room air CRP: Normal Remdesivir: Started on 07/16/2020 Steroids: Currently not indicated given lack of hypoxia Baricitinib/Actemra(off-label use): Currently not indicated The investigational nature of this medication was discussed with the patient/HCPOA and they choose to proceed as the potential benefits are felt to outweigh risks at this time.  Monoclonal antibody received on admission Antibiotics: None Vitamin C and Zinc: Continue DVT Prophylaxis: enoxaparin (LOVENOX) injection 40 mg Start: 07/16/20 2230  Prone positioning: Patient encouraged to stay in prone position as much as possible.  The treatment plan and use of medications and known side effects were discussed with patient/family. It was clearly explained that Complete risks and long-term side effects are unknown, however in the best clinical judgment they seem to be of some clinical benefit rather than medical risks. Patient/family agree with the treatment plan and want to receive these treatments as indicated.   2.  Hypokalemia. replaced.  Monitor.  3.  Hyperlipidemia Continue statin  4.  Depression Appears to be withdrawn.  New medications adjusted. Currently continue Lexapro, Remeron added.  Will monitor response.  5.  Vitamin B12 deficiency Continue replacement  6.  Pulmonary nodule Repeat CT scan outpatient once better from COVID-19.  7.  Anorexia In  the setting of COVID-19 illness. Monitor. Change diet to regular diet. Add Remeron. X-ray abdomen negative for any acute abnormality.  8.  Hypothyroidism Continuing Cytomel  9.  Goals of care conversation. Despite being on regular diet, Marinol, Remeron patient has poor p.o. intake. X-ray shows no evidence of obstruction.  Patient does not have any nausea or vomiting.  Patient reportedly does not have any anosmia. Patient's husband passed away a few months ago and appears to have withdrawn since then. At present concern is will have poor prognosis. Multiple attempts to reach the family were not successful on 07/22/2020. Palliative care consulted.  10.  Right lower extremity edema. Lower extremity Doppler ordered.  Currently results pending. Continue pain control  Diet: Regular diet DVT Prophylaxis:   enoxaparin (LOVENOX) injection 40 mg Start: 07/16/20 2230   Advance goals of care discussion: Full code  Family Communication: no family was present at bedside, at the time of interview.  Attempt to reach 2 daughters were not successful.  Unable to leave voicemail.  Disposition:  Status is: Inpatient  Remains inpatient appropriate because: Not eating or drinking.  Appears to be withdrawn.  Poor prognosis.   Dispo: The patient is from: Home              Anticipated d/c is to: SNF              Anticipated d/c date is: 3 days              Patient currently is not medically stable to d/c.   Subjective: crying in pain on right leg, appears withdrawn. No nausea or acute complains   Physical Exam:  General: Appear in mild distress, no Rash; Oral Mucosa Clear, moist.  no Abnormal Neck Mass Or lumps, Conjunctiva normal  Cardiovascular: S1 and S2 Present, no Murmur, Respiratory: good respiratory effort, Bilateral Air entry present and Clear to Auscultation, no Crackles, no wheezes Abdomen: Bowel Sound present, Soft and no tenderness Extremities: Right leg pedal edema, no calf  tenderness Neurology: alert and oriented to time, place, and person affect appropriate. no new focal deficit Gait not checked due to patient safety concerns  Vitals:   07/22/20 0815 07/22/20 1200 07/22/20 1545 07/22/20 1605  BP: (!) 100/44 (!) 112/54 (!) 75/46 (!) 110/46  Pulse: 67  80   Resp: 16 (!) 22 13   Temp: 98.4 F (36.9 C) 98.4 F (36.9 C) 98.3 F (36.8 C)   TempSrc: Axillary  Oral   SpO2: 94% 94% 93%   Weight:      Height:        Intake/Output Summary (Last 24 hours) at 07/22/2020 1700 Last data filed at 07/22/2020 1500 Gross per 24 hour  Intake --  Output 250 ml  Net -250 ml   Filed Weights   07/16/20 0825  Weight: 50.3 kg    Data Reviewed: I have personally reviewed and interpreted daily labs, tele strips, imagings as discussed above. I reviewed all nursing notes, pharmacy notes, vitals, pertinent old records I have discussed plan of care as described above with RN and patient/family.  CBC: Recent Labs  Lab 07/17/20 0450 07/18/20 0726 07/19/20 0635 07/20/20 0448 07/21/20 0404  WBC 5.5 2.9* 4.3 4.7 7.9  NEUTROABS 2.9 0.4* 1.1* 1.7 5.1  HGB 12.6 12.6 11.9* 13.1 13.4  HCT 38.0 38.8 34.9* 38.5 39.1  MCV 92.2 92.8 90.2 89.3 88.9  PLT 187 179 183 217 676   Basic Metabolic Panel: Recent Labs  Lab 07/17/20 0450 07/18/20 0726 07/19/20 0635 07/20/20 0448 07/21/20 0404  NA 137 137 141 142 141  K 3.9 3.9 4.1 4.0 3.6  CL 104 106 110 105 104  CO2 25 22 23 28 31   GLUCOSE 90 101* 101* 108* 105*  BUN 15 17 16 12 15   CREATININE 0.87 0.94 0.80 0.74 0.77  CALCIUM 7.7* 8.1* 8.2* 8.6* 8.0*  MG  --   --  2.0  --   --     Studies: US Venous Img Lower Unilateral Right (DVT)  Result Date: 07/22/2020 CLINICAL DATA:  86 year old with right lower extremity edema. EXAM: RIGHT LOWER EXTREMITY VENOUS DOPPLER ULTRASOUND TECHNIQUE: Gray-scale sonography with graded compression, as well as color Doppler and duplex ultrasound were performed to evaluate the lower  extremity deep venous systems from the level of the common femoral vein and including the common femoral, femoral, profunda femoral, popliteal and calf veins including the posterior tibial, peroneal and gastrocnemius veins when visible. The superficial great saphenous vein was also interrogated. Spectral Doppler was utilized to evaluate flow at rest and with distal augmentation maneuvers in the common femoral, femoral and popliteal veins. COMPARISON:  None. FINDINGS: Contralateral Common Femoral Vein: Respiratory phasicity is normal and symmetric with the symptomatic side. No evidence of thrombus. Normal compressibility. Common Femoral Vein: No evidence of thrombus. Normal compressibility, respiratory phasicity and response to augmentation. Saphenofemoral Junction: No evidence of thrombus. Normal compressibility and flow on color Doppler imaging. Profunda Femoral Vein: No evidence of thrombus. Normal compressibility and flow on color Doppler imaging. Femoral Vein: No evidence of thrombus. Normal compressibility, respiratory phasicity and response to augmentation. Popliteal Vein: No evidence of thrombus. Normal compressibility, respiratory phasicity and response to augmentation. Calf Veins: No evidence of thrombus. Normal  compressibility and flow on color Doppler imaging. Other Findings:  None. IMPRESSION: Negative for deep venous thrombosis in right lower extremity. Electronically Signed   By: Markus Daft M.D.   On: 07/22/2020 16:03    Scheduled Meds: . vitamin C  500 mg Oral Daily  . aspirin EC  81 mg Oral Daily  . atorvastatin  20 mg Oral Daily  . cholecalciferol  1,000 Units Oral Daily  . dronabinol  2.5 mg Oral BID AC  . enoxaparin (LOVENOX) injection  40 mg Subcutaneous Q24H  . famotidine  20 mg Oral Daily  . feeding supplement (ENSURE ENLIVE)  237 mL Oral TID BM  . guaiFENesin  600 mg Oral BID  . liothyronine  10 mcg Oral Daily  . mirtazapine  7.5 mg Oral QHS  . multivitamin with minerals  1  tablet Oral Daily  . vitamin B-12  1,000 mcg Oral Daily  . zinc sulfate  220 mg Oral Daily   Continuous Infusions:  PRN Meds: acetaminophen, chlorpheniramine-HYDROcodone, guaiFENesin-dextromethorphan, magnesium hydroxide, ondansetron **OR** ondansetron (ZOFRAN) IV, simethicone, traZODone  Time spent: 35 minutes  Author: Berle Mull, MD [facility] 07/22/2020 5:00 PM  To reach On-call, see care teams to locate the attending and reach out via [email or web address]. Between 7PM-7AM, please contact night-coverage If you still have difficulty reaching the attending provider, please page the [facility] (Director on Call) for [facility] on amion for assistance.

## 2020-07-22 NOTE — Progress Notes (Signed)
[facility] Progress Note  Patient: Emily Wu    YWV:[id number]  DOA: 07/16/2020               Date of Service: the patient was seen and examined on 07/21/2020  Brief hospital course: Past medical history of HTN, HLD, OA.  Presents with complaints of body ache and weakness and diarrhea and found to have COVID-19 illness. Currently plan is continue monitor improvement in oral intake.  Assessment and Plan: 1. Acute COVID-19 Viral infection CXR: no bilateral peripheral opacities Oxygen requirements: On room air CRP: Normal Remdesivir: Started on 07/16/2020 Steroids: Currently not indicated given lack of hypoxia Baricitinib/Actemra(off-label use): Currently not indicated The investigational nature of this medication was discussed with the patient/HCPOA and they choose to proceed as the potential benefits are felt to outweigh risks at this time.  Monoclonal antibody received on admission Antibiotics: None Vitamin C and Zinc: Continue DVT Prophylaxis: enoxaparin (LOVENOX) injection 40 mg Start: 07/16/20 2230  Prone positioning: Patient encouraged to stay in prone position as much as possible.  The treatment plan and use of medications and known side effects were discussed with patient/family. It was clearly explained that Complete risks and long-term side effects are unknown, however in the best clinical judgment they seem to be of some clinical benefit rather than medical risks. Patient/family agree with the treatment plan and want to receive these treatments as indicated.   2.  Hypokalemia. Being replaced.  Monitor.  3.  Hyperlipidemia Continue statin  4.  Depression Stable. Currently continue Lexapro, Remeron added.  Will monitor response.  5.  Vitamin B12 deficiency Continue replacement  6.  Pulmonary nodule Repeat CT scan chest x-ray outpatient once better from COVID-19.  7.  Anorexia In the setting of COVID-19 illness. Monitor. Change diet to  regular diet. Add Remeron. X-ray abdomen negative for any acute abnormality.  8.  Hypothyroidism Continuing Cytomel  Diet: Regular diet DVT Prophylaxis:   enoxaparin (LOVENOX) injection 40 mg Start: 07/16/20 2230   Advance goals of care discussion: Full code  Family Communication: no family was present at bedside, at the time of interview.   Disposition:  Status is: Inpatient  Remains inpatient appropriate because:Hemodynamically unstable   Dispo: The patient is from: Home  Anticipated d/c is to: SNF  Anticipated d/c date is: 3 days  Patient currently is not medically stable to d/c.   Subjective: More awake.  Continues to have minimal oral intake.  No nausea no vomiting.  No fever no chills.  Physical Exam:  General: Appear in mild distress, no Rash; Oral Mucosa Clear, moist. no Abnormal Neck Mass Or lumps, Conjunctiva normal  Cardiovascular: S1 and S2 Present, no Murmur, Respiratory: good respiratory effort, Bilateral Air entry present and Clear to Auscultation, no Crackles, no wheezes Abdomen: Bowel Sound present, Soft and no tenderness Extremities: no Pedal edema, no calf tenderness Neurology: alert and oriented to time, place, and person affect appropriate. no new focal deficit Gait not checked due to patient safety concerns        Vitals:   07/20/20 1709 07/20/20 2052 07/21/20 0900 07/21/20 1612  BP: (!) 131/59 (!) 130/55 117/80 (!) 138/52  Pulse: 65 68 77 75  Resp: 17 16  16   Temp: 98.4 F (36.9 C) 98.2 F (36.8 C) 97.6 F (36.4 C) 98.9 F (37.2 C)  TempSrc: Oral Oral Oral Oral  SpO2: 96% 95% 97% 99%  Weight:      Height:       No intake  or output data in the 24 hours ending 07/21/20 1807    Filed Weights   07/16/20 0825  Weight: 50.3 kg    Data Reviewed: I have personally reviewed and interpreted daily labs, tele strips, imagings as discussed above. I reviewed all nursing notes,  pharmacy notes, vitals, pertinent old records I have discussed plan of care as described above with RN and patient/family.  CBC: Last Labs          Recent Labs  Lab 07/17/20 0450 07/18/20 0726 07/19/20 0635 07/20/20 0448 07/21/20 0404  WBC 5.5 2.9* 4.3 4.7 7.9  NEUTROABS 2.9 0.4* 1.1* 1.7 5.1  HGB 12.6 12.6 11.9* 13.1 13.4  HCT 38.0 38.8 34.9* 38.5 39.1  MCV 92.2 92.8 90.2 89.3 88.9  PLT 187 179 183 217 257     Basic Metabolic Panel: Last Labs          Recent Labs  Lab 07/17/20 0450 07/18/20 0726 07/19/20 0635 07/20/20 0448 07/21/20 0404  NA 137 137 141 142 141  K 3.9 3.9 4.1 4.0 3.6  CL 104 106 110 105 104  CO2 25 22 23 28 31   GLUCOSE 90 101* 101* 108* 105*  BUN 15 17 16 12 15   CREATININE 0.87 0.94 0.80 0.74 0.77  CALCIUM 7.7* 8.1* 8.2* 8.6* 8.0*  MG  --   --  2.0  --   --       Studies: DG Abd Portable 1V  Result Date: 07/21/2020 CLINICAL DATA:  Constipation. EXAM: PORTABLE ABDOMEN - 1 VIEW COMPARISON:  July 19, 2020 FINDINGS: Nonobstructive bowel gas pattern. No evidence of constipation. No other acute abnormalities. IMPRESSION: Negative. Electronically Signed   By: Dorise Bullion III M.D   On: 07/21/2020 16:32    Scheduled Meds: . vitamin C  500 mg Oral Daily  . aspirin EC  81 mg Oral Daily  . atorvastatin  20 mg Oral Daily  . cholecalciferol  1,000 Units Oral Daily  . dronabinol  2.5 mg Oral BID AC  . enoxaparin (LOVENOX) injection  40 mg Subcutaneous Q24H  . famotidine  20 mg Oral Daily  . feeding supplement (ENSURE ENLIVE)  237 mL Oral TID BM  . guaiFENesin  600 mg Oral BID  . liothyronine  10 mcg Oral Daily  . mirtazapine  7.5 mg Oral QHS  . multivitamin with minerals  1 tablet Oral Daily  . vitamin B-12  1,000 mcg Oral Daily  . zinc sulfate  220 mg Oral Daily   Continuous Infusions: PRN Meds: acetaminophen, chlorpheniramine-HYDROcodone, guaiFENesin-dextromethorphan, magnesium hydroxide, ondansetron **OR** ondansetron (ZOFRAN) IV,  simethicone, traZODone  Time spent: 35 minutes  Author: Berle Mull, MD [facility] 07/21/2020 6:07 PM  To reach On-call, see care teams to locate the attending and reach out via [email or web address]. Between 7PM-7AM, please contact night-coverage If you still have difficulty reaching the attending provider, please page the [facility] (Director on Call) for [facility] on amion for assistance.

## 2020-07-23 LAB — COMPREHENSIVE METABOLIC PANEL
ALT: 19 U/L (ref 0–44)
AST: 24 U/L (ref 15–41)
Albumin: 2.9 g/dL — ABNORMAL LOW (ref 3.5–5.0)
Alkaline Phosphatase: 82 U/L (ref 38–126)
Anion gap: 5 (ref 5–15)
BUN: 22 mg/dL (ref 8–23)
CO2: 31 mmol/L (ref 22–32)
Calcium: 8.1 mg/dL — ABNORMAL LOW (ref 8.9–10.3)
Chloride: 105 mmol/L (ref 98–111)
Creatinine, Ser: 0.93 mg/dL (ref 0.44–1.00)
GFR calc Af Amer: >60 mL/min (ref >60)
GFR calc non Af Amer: 55 mL/min — ABNORMAL LOW (ref >60)
Glucose, Bld: 96 mg/dL (ref 70–99)
Potassium: 3.8 mmol/L (ref 3.5–5.1)
Sodium: 141 mmol/L (ref 135–145)
Total Bilirubin: 0.9 mg/dL (ref 0.3–1.2)
Total Protein: 5.9 g/dL — ABNORMAL LOW (ref 6.5–8.1)

## 2020-07-23 LAB — CBC
HCT: 38.1 % (ref 36.0–46.0)
Hemoglobin: 12.8 g/dL (ref 12.0–15.0)
MCH: 31.2 pg (ref 26.0–34.0)
MCHC: 33.6 g/dL (ref 30.0–36.0)
MCV: 92.9 fL (ref 80.0–100.0)
Platelets: 298 10*3/uL (ref 150–400)
RBC: 4.1 MIL/uL (ref 3.87–5.11)
RDW: 14.1 % (ref 11.5–15.5)
WBC: 10.2 10*3/uL (ref 4.0–10.5)
nRBC: 0 % (ref 0.0–0.2)

## 2020-07-23 LAB — MAGNESIUM: Magnesium: 2.1 mg/dL (ref 1.7–2.4)

## 2020-07-23 MED ORDER — METHOCARBAMOL 500 MG PO TABS
500.0000 mg | ORAL_TABLET | Freq: Three times a day (TID) | ORAL | Status: DC | PRN
  Administered 2020-07-23: 10:00:00 500 mg via ORAL
  Filled 2020-07-23 (×3): qty 1

## 2020-07-23 MED ORDER — BOOST / RESOURCE BREEZE PO LIQD CUSTOM
1.0000 | Freq: Three times a day (TID) | ORAL | Status: DC
  Administered 2020-07-23: 1 via ORAL

## 2020-07-23 MED ORDER — LORAZEPAM 0.5 MG PO TABS
0.5000 mg | ORAL_TABLET | Freq: Four times a day (QID) | ORAL | Status: DC | PRN
  Administered 2020-07-25 – 2020-07-27 (×3): 0.5 mg via ORAL
  Filled 2020-07-23 (×3): qty 1

## 2020-07-23 MED ORDER — MIRTAZAPINE 15 MG PO TABS
15.0000 mg | ORAL_TABLET | Freq: Every day | ORAL | Status: DC
  Administered 2020-07-23 – 2020-07-27 (×5): 15 mg via ORAL
  Filled 2020-07-23 (×5): qty 1

## 2020-07-23 MED ORDER — HYDROCODONE-ACETAMINOPHEN 7.5-325 MG PO TABS
1.0000 | ORAL_TABLET | Freq: Four times a day (QID) | ORAL | Status: DC | PRN
Start: 2020-07-23 — End: 2020-07-23

## 2020-07-23 MED ORDER — CHLORHEXIDINE GLUCONATE CLOTH 2 % EX PADS
6.0000 | MEDICATED_PAD | Freq: Every day | CUTANEOUS | Status: DC
  Administered 2020-07-23 – 2020-07-27 (×5): 6 via TOPICAL

## 2020-07-23 NOTE — Progress Notes (Signed)
Tried to get patient up to the [facility] after bladder scanning her and seeing she had over 400cc's urine retaining. Sat patient up on side of bed and she didn't have the strength to hold herself up just sitting on edge of bed. Assisted her back to bed, told doctor and received orders to insert foley catheter.  Milky pink urine returned during foley insertion. Dr. Posey Pronto shown.

## 2020-07-23 NOTE — Progress Notes (Signed)
Physical Therapy Treatment Patient Details Name: Emily Wu MRN: [id number] DOB: 11-12-1931 Today's Date: 07/23/2020    History of Present Illness Emily Wu is an 32yoF who presented to ER secondary to progressing weakness, recurrent diarrhea; admitted for management of COVID-19 viral infection with GI manifestation.    PT Comments    Pt in bed upon entry agreeable to participate. modA to EOB, pt has severe pain in Rt knee with initiation of movement, (report chronic Rt knee OA, TKA pending) author provides P/ROM x15 with improved tolerance to activity. Pt able to perform seated leg exercises, initially requires minA support to sit at EOB for 3-4 minutes, then able to sit unsupported thereafter. PT performs STS transfers x11 with minA or less, no RW trialed this date. Pt then performs dependent stand pivot transfer to recliner with minA. Pt looks much more lively up in chair, asks why she has not been able to do this before hand- author explained how weak she was upon initial assessment. Pt left up in chair at EOS, given a diet Ginger Ale as requested, RN made aware.   Follow Up Recommendations  SNF     Equipment Recommendations  None recommended by PT    Recommendations for Other Services       Precautions / Restrictions Precautions Precautions: Fall Restrictions Weight Bearing Restrictions: No    Mobility  Bed Mobility Overal bed mobility: Needs Assistance Bed Mobility: Supine to Sit     Supine to sit: Mod assist     General bed mobility comments: authro helsp with legs due to acute on chronic Rt knee pain, pt pulls self to EOB with hand held assist.  Transfers Overall transfer level: Needs assistance Equipment used: 1 person hand held assist Transfers: Sit to/from [facility] Sit to Stand: Min guard;Min assist Stand pivot transfers: Min assist;Min guard       General transfer comment: steady with taking steps to recliner, 90 degree  pivot  Ambulation/Gait Ambulation/Gait assistance:  (deferred, will attempt next visit)               Stairs             Wheelchair Mobility    Modified Rankin (Stroke Patients Only)       Balance                                            Cognition Arousal/Alertness: Awake/alert Behavior During Therapy: WFL for tasks assessed/performed Overall Cognitive Status: Within Functional Limits for tasks assessed                                 General Comments: tearful at times regarding husbands death in February 27, 2023      Exercises General Exercises - Lower Extremity Long Arc Quad: AROM;Both;10 reps;Seated Hip Flexion/Marching: AROM;Seated;Both;10 reps Other Exercises Other Exercises: STS from EOB x10 dependent transfer with minA or less provided    General Comments        Pertinent Vitals/Pain Pain Assessment: No/denies pain    Home Living                      Prior Function            PT Goals (current goals can now be found in the care plan section)  Acute Rehab PT Goals Patient Stated Goal: to get my strength back PT Goal Formulation: With patient Time For Goal Achievement: 08/01/20 Progress towards PT goals: Progressing toward goals    Frequency    Min 2X/week      PT Plan Current plan remains appropriate    Co-evaluation              AM-PAC PT "6 Clicks" Mobility   Outcome Measure  Help needed turning from your back to your side while in a flat bed without using bedrails?: A Little Help needed moving from lying on your back to sitting on the side of a flat bed without using bedrails?: A Little Help needed moving to and from a bed to a chair (including a wheelchair)?: A Little Help needed standing up from a chair using your arms (e.g., wheelchair or bedside chair)?: A Little Help needed to walk in hospital room?: A Lot Help needed climbing 3-5 steps with a railing? : A Lot 6 Click Score:  16    End of Session   Activity Tolerance: Patient tolerated treatment well;No increased pain Patient left: in chair;with chair alarm set;with call bell/phone within reach Nurse Communication: Mobility status PT Visit Diagnosis: Muscle weakness (generalized) (M62.81);Difficulty in walking, not elsewhere classified (R26.2)     Time: 8891-6945 PT Time Calculation (min) (ACUTE ONLY): 33 min  Charges:  $Therapeutic Exercise: 23-37 mins                     5:08 PM, 07/23/20 Etta Grandchild, PT, DPT Physical Therapist - [facility]  [phone] ([place])    Niles C 07/23/2020, 5:03 PM

## 2020-07-23 NOTE — Progress Notes (Signed)
[facility] Progress Note  Patient: Emily Wu    DIY:[id number]  DOA: 07/16/2020     Date of Service: the patient was seen and examined on 07/23/2020  Brief hospital course: Past medical history of HTN, HLD, OA.  Presents with complaints of body ache and weakness and diarrhea and found to have COVID-19 illness. Currently plan is continue monitor improvement in oral intake and engage with family regarding goals of care conversation.  Assessment and Plan: 1. Acute COVID-19 Viral infection CXR: no bilateral peripheral opacities Oxygen requirements: On room air CRP: Normal Remdesivir: Started on 07/16/2020 Steroids: Currently not indicated given lack of hypoxia Baricitinib/Actemra(off-label use): Currently not indicated The investigational nature of this medication was discussed with the patient/HCPOA and they choose to proceed as the potential benefits are felt to outweigh risks at this time.  Monoclonal antibody received on admission Antibiotics: None Vitamin C and Zinc: Continue DVT Prophylaxis: enoxaparin (LOVENOX) injection 40 mg Start: 07/16/20 2230 Prone positioning: Patient encouraged to stay in prone position as much as possible.  The treatment plan and use of medications and known side effects were discussed with patient/family. It was clearly explained that Complete risks and long-term side effects are unknown, however in the best clinical judgment they seem to be of some clinical benefit rather than medical risks. Patient/family agree with the treatment plan and want to receive these treatments as indicated.   2.  Hypokalemia. replaced.  Monitor.  3.  Poor p.o. intake. In the setting of COVID-19 illness. Change diet to regular diet. Add Remeron. X-ray abdomen negative for any acute abnormality.  4.  Depression Appears to be withdrawn.  New medications adjusted. Currently continue Lexapro, Remeron added.  Will monitor response.  5.  Vitamin B12  deficiency Continue replacement  6.  Pulmonary nodule Repeat CT scan outpatient once better from COVID-19.  7.  Hyperlipidemia Continue statin  8.  Hypothyroidism Continuing Cytomel  9.  Goals of care conversation. Despite being on regular diet, Marinol, Remeron patient has poor p.o. intake. X-ray shows no evidence of obstruction.  Patient does not have any nausea or vomiting.  Patient reportedly does not have any anosmia. Patient's husband passed away a few months ago and appears to have withdrawn since then. At present concern is will have poor prognosis. Palliative care consulted. Had a prolonged conversation with the family again on 07/23/2020. Reiterated poor prognosis.  Recommended to consider DNR/DNI given patient's lack of significant reserves.  10.  Right lower extremity edema. Lower extremity Doppler negative. Unable to provide narcotics secondary to intolerance of tramadol. Unable to provide anesthetics. Robaxin not that effective. Continue pain control heat/ice  Diet: Regular diet, mechanical soft DVT Prophylaxis:   enoxaparin (LOVENOX) injection 40 mg Start: 07/16/20 2230  Advance goals of care discussion: Full code  Family Communication: no family was present at bedside, at the time of interview.  Discussed with daughter on the phone.  Disposition:  Status is: Inpatient  Remains inpatient appropriate because: Not eating or drinking.  Appears to be withdrawn.  Poor prognosis.   Dispo: The patient is from: Home              Anticipated d/c is to: SNF              Anticipated d/c date is: 3 days              Patient currently is not medically stable to d/c.   Subjective: No nausea no vomiting.  No fever no  chills.  Minimal oral intake.  Continues to have complaints of right leg pain.  Physical Exam:  General: Appear in mild distress, no Rash; Oral Mucosa Clear, moist. no Abnormal Neck Mass Or lumps, Conjunctiva normal  Cardiovascular: S1 and S2 Present,  no Murmur, Respiratory: good respiratory effort, Bilateral Air entry present and Clear to Auscultation, no Crackles, no wheezes Abdomen: Bowel Sound present, Soft and no tenderness Extremities: Right leg pedal edema, no calf tenderness Neurology: alert and oriented to time, place, and person affect appropriate. no new focal deficit Gait not checked due to patient safety concerns  Vitals:   07/23/20 1000 07/23/20 1030 07/23/20 1200 07/23/20 1529  BP: (!) 85/29 (!) 112/47 (!) 107/41 (!) 107/49  Pulse:  72    Resp: 20 18 18 16   Temp:  98.4 F (36.9 C)    TempSrc:  Oral    SpO2:  95%    Weight:      Height:        Intake/Output Summary (Last 24 hours) at 07/23/2020 1929 Last data filed at 07/22/2020 2200 Gross per 24 hour  Intake 60 ml  Output --  Net 60 ml   Filed Weights   07/16/20 0825  Weight: 50.3 kg    Data Reviewed: I have personally reviewed and interpreted daily labs, tele strips, imagings as discussed above. I reviewed all nursing notes, pharmacy notes, vitals, pertinent old records I have discussed plan of care as described above with RN and patient/family.  CBC: Recent Labs  Lab 07/17/20 0450 07/17/20 0450 07/18/20 0726 07/19/20 0635 07/20/20 0448 07/21/20 0404 07/23/20 0734  WBC 5.5   < > 2.9* 4.3 4.7 7.9 10.2  NEUTROABS 2.9  --  0.4* 1.1* 1.7 5.1  --   HGB 12.6   < > 12.6 11.9* 13.1 13.4 12.8  HCT 38.0   < > 38.8 34.9* 38.5 39.1 38.1  MCV 92.2   < > 92.8 90.2 89.3 88.9 92.9  PLT 187   < > 179 183 217 257 298   < > = values in this interval not displayed.   Basic Metabolic Panel: Recent Labs  Lab 07/18/20 0726 07/19/20 0635 07/20/20 0448 07/21/20 0404 07/23/20 0734  NA 137 141 142 141 141  K 3.9 4.1 4.0 3.6 3.8  CL 106 110 105 104 105  CO2 22 23 28 31 31   GLUCOSE 101* 101* 108* 105* 96  BUN 17 16 12 15 22   CREATININE 0.94 0.80 0.74 0.77 0.93  CALCIUM 8.1* 8.2* 8.6* 8.0* 8.1*  MG  --  2.0  --   --  2.1    Studies: No results found.   Scheduled Meds: . vitamin C  500 mg Oral Daily  . aspirin EC  81 mg Oral Daily  . atorvastatin  20 mg Oral Daily  . cholecalciferol  1,000 Units Oral Daily  . dronabinol  2.5 mg Oral BID AC  . enoxaparin (LOVENOX) injection  40 mg Subcutaneous Q24H  . famotidine  20 mg Oral Daily  . feeding supplement  1 Container Oral TID BM  . guaiFENesin  600 mg Oral BID  . liothyronine  10 mcg Oral Daily  . mirtazapine  15 mg Oral QHS  . multivitamin with minerals  1 tablet Oral Daily  . vitamin B-12  1,000 mcg Oral Daily  . zinc sulfate  220 mg Oral Daily   Continuous Infusions:  PRN Meds: acetaminophen, chlorpheniramine-HYDROcodone, guaiFENesin-dextromethorphan, LORazepam, magnesium hydroxide, methocarbamol, ondansetron **OR** ondansetron (ZOFRAN) IV, simethicone,  traZODone  Time spent: 35 minutes  Author: Berle Mull, MD [facility] 07/23/2020 7:29 PM  To reach On-call, see care teams to locate the attending and reach out via [email or web address]. Between 7PM-7AM, please contact night-coverage If you still have difficulty reaching the attending provider, please page the [facility] (Director on Call) for [facility] on amion for assistance.

## 2020-07-24 ENCOUNTER — Ambulatory Visit: Payer: Medicare Other | Admitting: Physical Therapy

## 2020-07-24 ENCOUNTER — Telehealth: Payer: Self-pay | Admitting: Physical Therapy

## 2020-07-24 DIAGNOSIS — Z515 Encounter for palliative care: Secondary | ICD-10-CM

## 2020-07-24 DIAGNOSIS — Z7189 Other specified counseling: Secondary | ICD-10-CM

## 2020-07-24 DIAGNOSIS — E43 Unspecified severe protein-calorie malnutrition: Secondary | ICD-10-CM | POA: Insufficient documentation

## 2020-07-24 NOTE — Consult Note (Addendum)
Consultation Note Date: 07/24/2020   Patient Name: Emily Wu  DOB: 1931-07-16  MRN: [id number]  Age / Sex: 86 y.o., female  PCP: Crecencio Mc, MD Referring Physician: Lavina Hamman, MD  Reason for Consultation: Establishing goals of care  HPI/Patient Profile: Past medical history of HTN, HLD, OA.  Presents with complaints of body ache and weakness and diarrhea and found to have COVID-19 illness.  Clinical Assessment and Goals of Care: Husband died 03-14-2020. Gets confused at times since husband died. Lives alone and is self sufficient. Daughter comes 1 day per week to spend the day and go places with her.   We discussed her diagnosis, prognosis, and GOC.  A detailed discussion was had today regarding advanced directives.  Concepts specific to code status, artifical feeding and hydration, IV antibiotics and rehospitalization were discussed.  The difference between an aggressive medical intervention path and a comfort care path was discussed.  Values and goals of care important to patient and family were attempted to be elicited.  Discussed limitations of medical interventions to prolong quality of life in some situations and discussed the concept of human mortality.  We discussed her concern over oral intake. She states "patient eats like a mouse".  Daughter states it sounds like she may have improving appetite with a change in diet.   She feels her mother would want to be a to be DNR and would not want a feeding tube, but needs to check. She feels her mother likely has narcolepsy and sleeps much more that others, and is very sensitive to medications, and does not want adjustments made.   Will see how she does today and call daughter back in the morning to discuss plans.      SUMMARY OF RECOMMENDATIONS   Will call daughter in the morning.    Prognosis:   Very poor        Primary Diagnoses: Present on Admission: . COVID-19 . COVID-19 virus infection   I have reviewed the medical record, interviewed the patient and family, and examined the patient. The following aspects are pertinent.  Past Medical History:  Diagnosis Date  . Arthritis    "knees, shoulders, chest" (02/03/2014)  . Bruises easily   . Cervical cancer ([place]) 1965  . High cholesterol   . History of kidney stones   . Hypertension    DENIES   . Tendency toward bleeding easily [facility])    Social History   Socioeconomic History  . Marital status: Married    Spouse name: Not on file  . Number of children: Not on file  . Years of education: Not on file  . Highest education level: Not on file  Occupational History  . Not on file  Tobacco Use  . Smoking status: Former Smoker    Packs/day: 2.00    Years: 7.00    Pack years: 14.00    Types: Cigarettes  . Smokeless tobacco: Never Used  . Tobacco comment: 02/03/2014 "quit smoking in the 1960's"  Vaping  Use  . Vaping Use: Never used  Substance and Sexual Activity  . Alcohol use: No  . Drug use: No  . Sexual activity: Never  Other Topics Concern  . Not on file  Social History Narrative  . Not on file   Social Determinants of Health   Financial Resource Strain:   . Difficulty of Paying Living Expenses: Not on file  Food Insecurity:   . Worried About Charity fundraiser in the Last Year: Not on file  . Ran Out of Food in the Last Year: Not on file  Transportation Needs:   . Lack of Transportation (Medical): Not on file  . Lack of Transportation (Non-Medical): Not on file  Physical Activity:   . Days of Exercise per Week: Not on file  . Minutes of Exercise per Session: Not on file  Stress:   . Feeling of Stress : Not on file  Social Connections:   . Frequency of Communication with Friends and Family: Not on file  . Frequency of Social Gatherings with Friends and Family: Not on file  . Attends Religious Services: Not on file   . Active Member of Clubs or Organizations: Not on file  . Attends Archivist Meetings: Not on file  . Marital Status: Not on file   Family History  Problem Relation Age of Onset  . Peripheral vascular disease Mother   . Stroke Father   . Hyperlipidemia Father   . Heart disease Father 82  . Cancer Neg Hx    Scheduled Meds: . vitamin C  500 mg Oral Daily  . aspirin EC  81 mg Oral Daily  . atorvastatin  20 mg Oral Daily  . Chlorhexidine Gluconate Cloth  6 each Topical Daily  . cholecalciferol  1,000 Units Oral Daily  . dronabinol  2.5 mg Oral BID AC  . enoxaparin (LOVENOX) injection  40 mg Subcutaneous Q24H  . famotidine  20 mg Oral Daily  . feeding supplement  1 Container Oral TID BM  . guaiFENesin  600 mg Oral BID  . liothyronine  10 mcg Oral Daily  . mirtazapine  15 mg Oral QHS  . multivitamin with minerals  1 tablet Oral Daily  . vitamin B-12  1,000 mcg Oral Daily  . zinc sulfate  220 mg Oral Daily   Continuous Infusions: PRN Meds:.acetaminophen, chlorpheniramine-HYDROcodone, guaiFENesin-dextromethorphan, LORazepam, magnesium hydroxide, methocarbamol, ondansetron **OR** ondansetron (ZOFRAN) IV, simethicone, traZODone Medications Prior to Admission:  Prior to Admission medications   Medication Sig Start Date End Date Taking? Authorizing Provider  acetaminophen (TYLENOL) 650 MG CR tablet Take 650 mg by mouth every 8 (eight) hours as needed for pain.    Yes [provider]  amitriptyline (ELAVIL) 25 MG tablet TAKE ONE TABLET AT BEDTIME NEED APP FOR NEXT REFILL Patient taking differently: Take 25 mg by mouth at bedtime.  07/03/20  Yes Crecencio Mc, MD  atorvastatin (LIPITOR) 20 MG tablet TAKE ONE TABLET EVERY DAY Patient taking differently: Take 20 mg by mouth daily.  02/29/20  Yes Crecencio Mc, MD  diclofenac sodium (VOLTAREN) 1 % GEL Apply 2 g topically 4 (four) times daily as needed (for knee pain). 12/24/18  Yes Crecencio Mc, MD  DOTTI 0.1 MG/24HR  patch CHANGE PATCH TWICE WEEKLY Patient taking differently: Place 1 patch onto the skin 2 (two) times a week.  05/29/20  Yes Crecencio Mc, MD  liothyronine (CYTOMEL) 5 MCG tablet TAKE 2 TABLETS BY MOUTH DAILY Patient taking differently:  Take 10 mcg by mouth daily.  06/18/20  Yes Crecencio Mc, MD  Multiple Vitamins-Minerals (CENTRUM SILVER PO) Take 1 tablet by mouth daily.   Yes [provider]  polyethylene glycol (MIRALAX / GLYCOLAX) packet Take 17 g by mouth daily as needed for moderate constipation.    Yes [provider]  potassium chloride SA (KLOR-CON) 20 MEQ tablet Take 1 tablet (20 mEq total) by mouth daily. 03/11/20  Yes Crecencio Mc, MD  protein supplement (RESOURCE BENEPROTEIN) POWD Take 1 scoop by mouth daily as needed (constipation).    Yes [provider]  sennosides-docusate sodium (SENOKOT-S) 8.6-50 MG tablet Take 2 tablets by mouth daily. 01/26/19  Yes Marchia Bond, MD  vitamin B-12 (CYANOCOBALAMIN) 1000 MCG tablet Take 1,000 mcg by mouth daily.    Yes [provider]   Allergies  Allergen Reactions  . Tramadol Anaphylaxis and Other (See Comments)    Lowered Blood Pressure  . Amoxicillin Rash  . Promethazine Hcl Rash  . Sulfa Drugs Cross Reactors Rash   Review of Systems  Constitutional: Positive for fatigue.    Physical Exam  Unable. On covid isolation.  Vital Signs: BP (!) 105/40 (BP Location: Left Arm)   Pulse 74   Temp 98.2 F (36.8 C) (Oral)   Resp 18   Ht 5\' 2"  (1.575 m)   Wt 50.3 kg   SpO2 99%   BMI 20.30 kg/m  Pain Scale: 0-10   Pain Score: 3    SpO2: SpO2: 99 % O2 Device:SpO2: 99 % O2 Flow Rate: .   IO: Intake/output summary:   Intake/Output Summary (Last 24 hours) at 07/24/2020 1456 Last data filed at 07/24/2020 1351 Gross per 24 hour  Intake 50 ml  Output 2000 ml  Net -1950 ml    LBM:   Baseline Weight: Weight: 50.3 kg Most recent weight: Weight: 50.3 kg     Palliative Assessment/Data:     COVID-19 DISASTER DECLARATION:    FULL CONTACT PHYSICAL EXAMINATION WAS NOT POSSIBLE DUE TO TREATMENT OF COVID-19  AND CONSERVATION OF PERSONAL PROTECTIVE EQUIPMENT   Patient assessed or the symptoms described in the history of present illness.  In the context of the Global COVID-19 pandemic, which necessitated consideration that the patient might be at risk for infection with the SARS-CoV-2 virus that causes COVID-19, Institutional protocols and algorithms that pertain to the evaluation of patients at risk for COVID-19 are in a state of rapid change based on information released by regulatory bodies including the CDC and federal and state organizations. These policies and algorithms were followed during the patient's care while in hospital.  Time In: 2:40 Time Out:3:30 Time Total: 50 min Greater than 50%  of this time was spent counseling and coordinating care related to the above assessment and plan.  Signed by: Asencion Gowda, NP   Please contact Palliative Medicine Team phone at [phone] for questions and concerns.  For individual provider: See Shea Evans

## 2020-07-24 NOTE — Progress Notes (Signed)
Daughter called, facetimed with pt

## 2020-07-24 NOTE — TOC Progression Note (Signed)
Transition of Care [facility]) - Progression Note    Patient Details  Name: KHAMIYA VARIN MRN: [id number] Date of Birth: 08-Feb-1931  Transition of Care [facility]) CM/SW Brenda, RN Phone Number: 07/24/2020, 3:47 PM  Clinical Narrative:   RNCM called into room to speak with patient per request of bedside nurse. Patient verbalizing that she wants to go home and she doesn't understand why she can't just have aide services help her as soon as she goes home, as she had insurance to cover this. Discussed with patient that she can likely get aide services at home if she decides on that option but that many agencies are requiring patients be a certain amount of days out prior to providing services. Further suggested that if there are certain agencies she wants to use that her family can reach out to them to determine how many days post positive diagnosis she needs to be. Patient verbalizes understanding and reports she will talk with her family.      Expected Discharge Plan: [place] Barriers to Discharge: No Barriers Identified  Expected Discharge Plan and Services Expected Discharge Plan: [place] arrangements for the past 2 months: Single Family Home                                       Social Determinants of Health (SDOH) Interventions    Readmission Risk Interventions No flowsheet data found.

## 2020-07-24 NOTE — Telephone Encounter (Signed)
Called and spoke to daughter Beckey Rutter about patient's scheduled outpatient PT for shoulder issues. Upon chart review patient is hospitalized with COVID19 at this point. Discussed pt's current disposition and agreed to cancel outpatient PT until we get a new referral or she needs outpatient PT in the future as it appears she will require extensive rehab for deconditioning and weakness that will take priority at this point. Updated office staff.   Everlean Alstrom. Graylon Good, PT, DPT 07/24/20, 2:23 PM

## 2020-07-24 NOTE — Progress Notes (Addendum)
[facility] Progress Note  Patient: Emily Wu    DUK:[id number]  DOA: 07/16/2020     Date of Service: the patient was seen and examined on 07/24/2020  Brief hospital course: Past medical history of HTN, HLD, OA.  Presents with complaints of body ache and weakness and diarrhea and found to have COVID-19 illness. Currently plan is continue monitor improvement in oral intake and engage with family regarding goals of care conversation.  Assessment and Plan: 1. Acute COVID-19 Viral infection CXR: no bilateral peripheral opacities Oxygen requirements: On room air CRP: Normal Remdesivir: completed 07/20/2020 Steroids: Currently not indicated given lack of hypoxia Baricitinib/Actemra(off-label use): Currently not indicated MAb: Monoclonal antibody received on admission Antibiotics: None Vitamin C and Zinc: Continue DVT Prophylaxis: enoxaparin (LOVENOX) injection 40 mg Start: 07/16/20 2230 Prone positioning: Patient encouraged to stay in prone position as much as possible.  The treatment plan and use of medications and known side effects were discussed with patient/family. It was clearly explained that Complete risks and long-term side effects are unknown, however in the best clinical judgment they seem to be of some clinical benefit rather than medical risks. Patient/family agree with the treatment plan and want to receive these treatments as indicated.   2.  Severe protein calorie malnutrition. Poor p.o. intake Failure to thrive in adult. Diarrhea, now constipation. In the setting of COVID-19 illness. Change diet to regular diet. Add Remeron. X-ray abdomen negative for any acute abnormality. No significant stool burden on x-ray. Dietary consulted. Patient with no p.o. intake.  Family engaged with palliative care for goals of care conversation. Patient categorically denied to me that she does not want any type of feeding tube even in the face of impending death  3.   Hypokalemia. replaced.  Monitor.  4.  Depression Initially appeared to be withdrawn.  New medications adjusted. Currently continue Lexapro, Remeron added.  Will monitor response. On 07/24/2020 appears better in terms of mood  5.  Vitamin B12 deficiency Continue replacement  6.  Pulmonary nodule Repeat CT scan outpatient once better from COVID-19.  7.  Hyperlipidemia Continue statin  8.  Hypothyroidism Continuing Cytomel  9.  Goals of care conversation. Despite being on regular diet, Marinol, Remeron patient has poor p.o. intake. X-ray shows no evidence of obstruction.  Patient does not have any nausea or vomiting.  Patient reportedly does not have any anosmia. Patient's husband passed away a few months ago and appears to have withdrawn since then. At present concern is will have poor prognosis. Palliative care consulted. Had a prolonged conversation with the family again on 07/23/2020. Reiterated poor prognosis.  Recommended to consider DNR/DNI given patient's lack of significant reserves.  10.  Right lower extremity edema. Lower extremity Doppler negative. Unable to provide narcotics secondary to intolerance of tramadol. Unable to provide anesthetics. Robaxin not that effective. Continue pain control with heat/ice  Body mass index is 20.3 kg/m.  Nutrition Problem: Severe Malnutrition Etiology: social / environmental circumstances (suspected inadequate oral intake, pt reports eating less since loss of her husband) Interventions: Interventions: Refer to RD note for recommendations   Addendum: On conversation with the daughter she told me that the patient has a living will that has DNR/DNI written as well as no feeding tube written. CODE STATUS changed in the chart to DNR/DNI. Also she would like the patient to go to the rehab. She is anticipating a call from the [facility] team tomorrow.  Berle Mull 7:54 PM 07/24/2020    Diet: Regular diet  soft DVT Prophylaxis:    enoxaparin (LOVENOX) injection 40 mg Start: 07/16/20 2230  Advance goals of care discussion: Full code  Family Communication: no family was present at bedside, at the time of interview.  The pt provided permission to discuss medical plan with the family. Discussed with daughter on the phone. Opportunity was given to ask question and all questions were answered satisfactorily.   Disposition:  Status is: Inpatient  Remains inpatient appropriate because: Poor p.o. intake with severe protein calorie malnutrition.  Goals of care conversation with the family.  Dispo: The patient is from: Home              Anticipated d/c is to: Patient wants to go home but does not want anybody to have infection due to her therefore considering SNF. Isolation period will end August 06, 2020.               Anticipated d/c date is: 1 day              Patient currently is not medically stable to d/c.  Subjective: Continues to have poor p.o. intake.  Tells me that she had a significant breakfast this morning but per RN she only had bites.  No nausea no vomiting but no fever or chills.  No chest pain abnormality.  Physical Exam:  General: Appear in mild distress, no Rash; Oral Mucosa Clear, moist. no Abnormal Neck Mass Or lumps, Conjunctiva normal  Cardiovascular: S1 and S2 Present, no Murmur, Respiratory: good respiratory effort, Bilateral Air entry present and Clear to Auscultation, no Crackles, no wheezes Abdomen: Bowel Sound present, Soft and no tenderness Extremities: no Pedal edema, no calf tenderness Neurology: alert and oriented to time, place, and person affect appropriate. no new focal deficit Gait not checked due to patient safety concerns  Vitals:   07/24/20 0842 07/24/20 1143 07/24/20 1548 07/24/20 1555  BP: 98/76 (!) 105/40 (!) 121/109 (!) 106/47  Pulse: 74  72 71  Resp: 18 18 19    Temp:  98.2 F (36.8 C) 98.3 F (36.8 C)   TempSrc:  Oral Oral   SpO2: 99% 99% 99%   Weight:       Height:        Intake/Output Summary (Last 24 hours) at 07/24/2020 1702 Last data filed at 07/24/2020 1351 Gross per 24 hour  Intake 50 ml  Output 2000 ml  Net -1950 ml   Filed Weights   07/16/20 0825  Weight: 50.3 kg   Data Reviewed: I have personally reviewed and interpreted daily labs, tele strips, imagings as discussed above. I reviewed all nursing notes, pharmacy notes, vitals, pertinent old records I have discussed plan of care as described above with RN and patient/family.  CBC: Recent Labs  Lab 07/18/20 0726 07/19/20 0635 07/20/20 0448 07/21/20 0404 07/23/20 0734  WBC 2.9* 4.3 4.7 7.9 10.2  NEUTROABS 0.4* 1.1* 1.7 5.1  --   HGB 12.6 11.9* 13.1 13.4 12.8  HCT 38.8 34.9* 38.5 39.1 38.1  MCV 92.8 90.2 89.3 88.9 92.9  PLT 179 183 217 257 034   Basic Metabolic Panel: Recent Labs  Lab 07/18/20 0726 07/19/20 0635 07/20/20 0448 07/21/20 0404 07/23/20 0734  NA 137 141 142 141 141  K 3.9 4.1 4.0 3.6 3.8  CL 106 110 105 104 105  CO2 22 23 28 31 31   GLUCOSE 101* 101* 108* 105* 96  BUN 17 16 12 15 22   CREATININE 0.94 0.80 0.74 0.77 0.93  CALCIUM 8.1* 8.2*  8.6* 8.0* 8.1*  MG  --  2.0  --   --  2.1    Studies: No results found.  Scheduled Meds: . vitamin C  500 mg Oral Daily  . aspirin EC  81 mg Oral Daily  . atorvastatin  20 mg Oral Daily  . Chlorhexidine Gluconate Cloth  6 each Topical Daily  . cholecalciferol  1,000 Units Oral Daily  . dronabinol  2.5 mg Oral BID AC  . enoxaparin (LOVENOX) injection  40 mg Subcutaneous Q24H  . famotidine  20 mg Oral Daily  . guaiFENesin  600 mg Oral BID  . liothyronine  10 mcg Oral Daily  . mirtazapine  15 mg Oral QHS  . multivitamin with minerals  1 tablet Oral Daily  . vitamin B-12  1,000 mcg Oral Daily  . zinc sulfate  220 mg Oral Daily   Continuous Infusions: PRN Meds: acetaminophen, chlorpheniramine-HYDROcodone, guaiFENesin-dextromethorphan, LORazepam, magnesium hydroxide, methocarbamol, ondansetron **OR**  ondansetron (ZOFRAN) IV, simethicone, traZODone  Time spent: 35 minutes  Author: Berle Mull, MD [facility] 07/24/2020 5:02 PM  To reach On-call, see care teams to locate the attending and reach out via [email or web address]. Between 7PM-7AM, please contact night-coverage If you still have difficulty reaching the attending provider, please page the [facility] (Director on Call) for [facility] on amion for assistance.

## 2020-07-24 NOTE — Progress Notes (Signed)
Daily update given to daughter, pt also spoke with daughter via facetime

## 2020-07-24 NOTE — Progress Notes (Signed)
Initial Nutrition Assessment  DOCUMENTATION CODES:   Severe malnutrition in context of social or environmental circumstances  INTERVENTION:  Provide Hormel Shake po TID with meals, provides 520 kcal and 22 grams of protein. Patient prefers vanilla.  Encouraged adequate intake of calories and protein at meals.  NUTRITION DIAGNOSIS:   Severe Malnutrition related to social / environmental circumstances (suspected inadequate oral intake, pt reports eating less since loss of her husband) as evidenced by severe fat depletion, severe muscle depletion.  GOAL:   Patient will meet greater than or equal to 90% of their needs  MONITOR:   PO intake, Supplement acceptance, Labs, Weight trends, I & O's  REASON FOR ASSESSMENT:   Consult Assessment of nutrition requirement/status  ASSESSMENT:   86 year old female with PMHx of cervical cancer in 1965, HTN, arthritis, HLD, osteoarthritis admitted with COVID-19.   Met with patient at bedside. She was finishing videoconferencing with her daughter. Patient tearful during assessment. She reports her husband passed away in 03-10-23 and she has been eating less than usual since then. She reports at home she typically eats 3 meals per day. For breakfast she may have coffee, orange juice, and muffin. For lunch and dinner she may go out to eat and have pizza, chicken, or other meat with sides. She reports she does not like oral nutrition supplements because they remind her of cancer (reports it's not that she doesn't like the taste). Patient only had bites of breakfast today. She reports that at lunch she had all of her mashed potatoes and about 25% of her chicken. She is on a mechanical soft diet due to difficulty chewing. Patient did not have menu in room. RD brought menu to patient and reviewed menu with her and discussed foods available on menu she had mentioned she enjoys. Patient reports she does not like the hospital food and is looking forward to going back  home. Family is bringing her dinner tonight. She reports she is going to eat better at meals and understands importance. Patient is also amenable to RD sending Hormel Shakes on trays for patient to try.  Patient reports she is unsure of her UBW but that she has been losing weight since her husband passed away in 03-10-23. According to chart patient is typically around 55 kg. She was last documented to be 55.4 kg on 06/18/2020. She is currently 50.3 kg (111 lbs) if current weight is accurate. That is a weight loss of 5.1 kg (9.2% body weight) over approximately one month, which is significant for time frame. However, unsure if current weight is accurate.  Medications reviewed and include: vitamin C 500 mg daily, vitamin D3 1000 units daily, Marinol 2.5 mg BID before lunch and supper, famotidine, Remeron 15 mg QHS, MVI, vitamin B12 1000 micrograms daily, zinc sulfate 220 mg daily.  Labs reviewed.  Discussed with RN. Per family patient "eats like a bird" at baseline and does not eat much. Family bringing in dinner for patient to have. Patient has been given Ensure, Boost, Magic Cup, and other ONS to try from unit and she did not want any of them.  NUTRITION - FOCUSED PHYSICAL EXAM:    Most Recent Value  Orbital Region Severe depletion  Upper Arm Region Severe depletion  Thoracic and Lumbar Region Moderate depletion  Buccal Region Severe depletion  Temple Region Severe depletion  Clavicle Bone Region Moderate depletion  Clavicle and Acromion Bone Region Severe depletion  Scapular Bone Region Severe depletion  Dorsal Hand Severe depletion  Patellar Region Moderate depletion  Anterior Thigh Region Moderate depletion  Posterior Calf Region Severe depletion  Edema (RD Assessment) None  Hair Reviewed  Eyes Reviewed  Mouth Reviewed  Skin Reviewed  Nails Reviewed     Diet Order:   Diet Order            DIET DYS 3 Room service appropriate? Yes; Fluid consistency: Thin  Diet effective now                 EDUCATION NEEDS:   No education needs have been identified at this time  Skin:  Skin Assessment: Reviewed RN Assessment  Last BM:  Unknown  Height:   Ht Readings from Last 1 Encounters:  07/16/20 _0  (1.575 m)   Weight:   Wt Readings from Last 1 Encounters:  07/16/20 50.3 kg   BMI:  Body mass index is 20.3 kg/m.  Estimated Nutritional Needs:   Kcal:  1400-1600  Protein:  70-80 grams  Fluid:  1.4-1.6 L/day  Jacklynn Barnacle, MS, RD, LDN Pager number available on Amion

## 2020-07-25 LAB — BASIC METABOLIC PANEL
Anion gap: 8 (ref 5–15)
BUN: 15 mg/dL (ref 8–23)
CO2: 26 mmol/L (ref 22–32)
Calcium: 8.5 mg/dL — ABNORMAL LOW (ref 8.9–10.3)
Chloride: 104 mmol/L (ref 98–111)
Creatinine, Ser: 0.92 mg/dL (ref 0.44–1.00)
GFR calc Af Amer: >60 mL/min (ref >60)
GFR calc non Af Amer: 56 mL/min — ABNORMAL LOW (ref >60)
Glucose, Bld: 112 mg/dL — ABNORMAL HIGH (ref 70–99)
Potassium: 3.6 mmol/L (ref 3.5–5.1)
Sodium: 138 mmol/L (ref 135–145)

## 2020-07-25 NOTE — Progress Notes (Signed)
Pt called daughter last night and told her that she was dying.  Pt remained tearful and restless during the night. She denies suicidal ideations.  PRN dose of Ativan administered around 0430 with good effect.  Pt currently resting.

## 2020-07-25 NOTE — Progress Notes (Signed)
Pt Alert to self and place, forgetful of time and situation but aware of her own forgetfulness. VS stable 96% spo2 on room air Pt sad and anxious being in hospital. This nurse spent time at bedside providing emotional support. Pt daughter [name] face timed and updated on POC. Pt eating 25% of meals with encouragement. Pt up to chair this afternoon. OOB to [facility] with standby assist.

## 2020-07-25 NOTE — Progress Notes (Signed)
PROGRESS NOTE    Emily Wu  SPQ:[id number] DOB: Sep 11, 1931 DOA: 07/16/2020 PCP: Crecencio Mc, MD     Brief Narrative:  Past medical history of HTN, HLD, OA. Presents with complaints of body ache and weakness and diarrhea and found to have COVID-19 illness. Currently plan is continue monitor improvement in oral intake and engage with family regarding goals of care conversation.    Subjective: A/O x1 (patient does not recall conversation between herself and palliative care physician this morning.  Attempted to contact palliative care physician was unsuccessful.   Assessment & Plan: Covid vaccination;   Active Problems:   COVID-19   COVID-19 virus infection   Protein-calorie malnutrition, severe   Acute COVID-19 Viral infection CXR: no bilateral peripheral opacities Oxygen requirements: On room air CRP: Normal Remdesivir: completed 07/20/2020 Steroids: Currently not indicated given lack of hypoxia Baricitinib/Actemra(off-label use):Currently not indicated MAb: Monoclonal antibody received on admission Antibiotics: None Vitamin C and Zinc: Continue DVT Prophylaxis:enoxaparin (LOVENOX) injection 40 mg Start: 07/16/20 2230 Prone positioning: Patient encouraged to stay in prone position as much as possible.  The treatment plan and use of medications and known side effects were discussed with patient/family. It was clearly explained that Complete risks and long-term side effects are unknown, however in the best clinical judgment they seem to be of some clinical benefit rather than medical risks. Patient/family agree with the treatment plan and want to receive these treatments as indicated.  2.  Severe protein calorie malnutrition. Poor p.o. intake Failure to thrive in adult. Diarrhea, now constipation. In the setting of COVID-19 illness. Change diet to regular diet. Add Remeron. X-ray abdomen negative for any acute abnormality. No significant stool burden on  x-ray. Dietary consulted. Patient with no p.o. intake.  Family engaged with palliative care for goals of care conversation. Patient categorically denied to me that she does not want any type of feeding tube even in the face of impending death  3.Hypokalemia. replaced. Monitor.  4. Depression Initially appeared to be withdrawn. New medications adjusted. Currently continue Lexapro, Remeron added. Will monitor response. On 07/24/2020 appears better in terms of mood  5. Vitamin B12 deficiency Continue replacement  6. Pulmonary nodule Repeat CT scan outpatient once better from COVID-19.  7. Hyperlipidemia Continue statin  8. Hypothyroidism Continuing Cytomel  9. Goals of care conversation. Despite being on regular diet, Marinol, Remeron patient has poor p.o. intake. X-ray shows no evidence of obstruction. Patient does not have any nausea or vomiting. Patient reportedly does not have any anosmia. Patient's husband passed away a few months ago and appears to have withdrawn since then. At present concern is will have poor prognosis. Palliative care consulted. Had a prolonged conversation with the family again on 07/23/2020. Reiterated poor prognosis.  Recommended to consider DNR/DNI given patient's lack of significant reserves.  10. Right lower extremity edema. Lower extremity Dopplernegative. Unable to provide narcotics secondary to intolerance of tramadol. Unable to provide anesthetics. Robaxin not that effective. Continue pain controlwith heat/ice  Competent to make medical decisions? -9/1 palliative care recommended psychiatric evaluation to determine if patient can make her own decisions medically..  Have placed order  Body mass index is 20.3 kg/m.  Nutrition Problem: Severe Malnutrition Etiology: social / environmental circumstances (suspected inadequate oral intake, pt reports eating less since loss of her husband) Interventions: Interventions:  Refer to RD note for recommendations   Addendum: On conversation with the daughter she told me that the patient has a living will that has DNR/DNI written as  well as no feeding tube written. CODE STATUS changed in the chart to DNR/DNI. Also she would like the patient to go to the rehab. She is anticipating a call from the [facility] team tomorrow. -9/1 per palliative care physician note they would like a psychiatry consult which has been placed.  Will attempt to contact palliative care in the a.m. to clarify conversation between daughter patient and palliative care    DVT prophylaxis: Lovenox Code Status: DNR Family Communication:  Status is: Inpatient    Dispo: The patient is from: Home              Anticipated d/c is to: Unsure              Anticipated d/c date is: Unsure              Patient currently awaiting palliative care recommendations as they have already spoken with daughter on plan but plan is not clear      Consultants:  Palliative care  Procedures/Significant Events:    I have personally reviewed and interpreted all radiology studies and my findings are as above.  VENTILATOR SETTINGS:    Cultures   Antimicrobials: Anti-infectives (From admission, onward)   Start     Ordered Stop   07/17/20 1000  remdesivir 100 mg in sodium chloride 0.9 % 100 mL IVPB       "Followed by" Linked Group Details   07/16/20 2145 07/20/20 1019   07/17/20 1000  remdesivir 100 mg in sodium chloride 0.9 % 100 mL IVPB  Status:  Discontinued       "Followed by" Linked Group Details   07/16/20 2227 07/16/20 2231   07/16/20 2230  remdesivir 200 mg in sodium chloride 0.9% 250 mL IVPB  Status:  Discontinued       "Followed by" Linked Group Details   07/16/20 2227 07/16/20 2231   07/16/20 2145  remdesivir 200 mg in sodium chloride 0.9% 250 mL IVPB       "Followed by" Linked Group Details   07/16/20 2145 07/17/20 0237       Devices    LINES / TUBES:      Continuous  Infusions:   Objective: Vitals:   07/25/20 0709 07/25/20 0903 07/25/20 1200 07/25/20 1641  BP:  (!) 121/55 (!) 101/32 (!) 103/41  Pulse: 64 66 85 83  Resp:  18 (!) 22 16  Temp:  (!) 96.5 F (35.8 C) 98.4 F (36.9 C) 97.6 F (36.4 C)  TempSrc:  Axillary Axillary Oral  SpO2: 96% 100% 96% 100%  Weight:      Height:        Intake/Output Summary (Last 24 hours) at 07/25/2020 1759 Last data filed at 07/25/2020 1200 Gross per 24 hour  Intake 240 ml  Output 500 ml  Net -260 ml   Filed Weights   07/16/20 0825  Weight: 50.3 kg    Examination:  General: A/O x1 (does not know where, when, why pleasantly confused No acute respiratory distress Eyes: negative scleral hemorrhage, negative anisocoria, negative icterus ENT: Negative Runny nose, negative gingival bleeding, Neck:  Negative scars, masses, torticollis, lymphadenopathy, JVD Lungs: Clear to auscultation bilaterally without wheezes or crackles Cardiovascular: Regular rate and rhythm without murmur gallop or rub normal S1 and S2 Abdomen: negative abdominal pain, nondistended, positive soft, bowel sounds, no rebound, no ascites, no appreciable mass Extremities: No significant cyanosis, clubbing, or edema bilateral lower extremities Skin: Negative rashes, lesions, ulcers Psychiatric: Unable to fully evaluate Central nervous  system:  Cranial nerves II through XII intact, tongue/uvula midline, all extremities muscle strength 5/5, sensation intact throughout,  negative dysarthria, negative expressive aphasia, negative receptive aphasia.  .     Data Reviewed: Care during the described time interval was provided by me .  I have reviewed this patient's available data, including medical history, events of note, physical examination, and all test results as part of my evaluation.  CBC: Recent Labs  Lab 07/19/20 0635 07/20/20 0448 07/21/20 0404 07/23/20 0734  WBC 4.3 4.7 7.9 10.2  NEUTROABS 1.1* 1.7 5.1  --   HGB 11.9* 13.1 13.4  12.8  HCT 34.9* 38.5 39.1 38.1  MCV 90.2 89.3 88.9 92.9  PLT 183 217 257 841   Basic Metabolic Panel: Recent Labs  Lab 07/19/20 0635 07/20/20 0448 07/21/20 0404 07/23/20 0734 07/25/20 0510  NA 141 142 141 141 138  K 4.1 4.0 3.6 3.8 3.6  CL 110 105 104 105 104  CO2 23 28 31 31 26   GLUCOSE 101* 108* 105* 96 112*  BUN 16 12 15 22 15   CREATININE 0.80 0.74 0.77 0.93 0.92  CALCIUM 8.2* 8.6* 8.0* 8.1* 8.5*  MG 2.0  --   --  2.1  --    GFR: Estimated Creatinine Clearance: 34.1 mL/min (by C-G formula based on SCr of 0.92 mg/dL). Liver Function Tests: Recent Labs  Lab 07/19/20 0635 07/20/20 0448 07/21/20 0404 07/23/20 0734  AST 34 42* 34 24  ALT 20 26 25 19   ALKPHOS 49 56 61 82  BILITOT 0.8 0.8 0.8 0.9  PROT 5.3* 5.7* 5.7* 5.9*  ALBUMIN 2.7* 2.9* 3.0* 2.9*   No results for input(s): LIPASE, AMYLASE in the last 168 hours. No results for input(s): AMMONIA in the last 168 hours. Coagulation Profile: No results for input(s): INR, PROTIME in the last 168 hours. Cardiac Enzymes: No results for input(s): CKTOTAL, CKMB, CKMBINDEX, TROPONINI in the last 168 hours. BNP (last 3 results) No results for input(s): PROBNP in the last 8760 hours. HbA1C: No results for input(s): HGBA1C in the last 72 hours. CBG: No results for input(s): GLUCAP in the last 168 hours. Lipid Profile: No results for input(s): CHOL, HDL, LDLCALC, TRIG, CHOLHDL, LDLDIRECT in the last 72 hours. Thyroid Function Tests: No results for input(s): TSH, T4TOTAL, FREET4, T3FREE, THYROIDAB in the last 72 hours. Anemia Panel: No results for input(s): VITAMINB12, FOLATE, FERRITIN, TIBC, IRON, RETICCTPCT in the last 72 hours. Sepsis Labs: Recent Labs  Lab 07/19/20 3244  LATICACIDVEN 1.2    Recent Results (from the past 240 hour(s))  SARS Coronavirus 2 by RT PCR (hospital order, performed in [facility] hospital lab) Nasopharyngeal Nasopharyngeal Swab     Status: Abnormal   Collection Time: 07/16/20  1:16 PM    Specimen: Nasopharyngeal Swab  Result Value Ref Range Status   SARS Coronavirus 2 POSITIVE (A) NEGATIVE Final    Comment: RESULT CALLED TO, READ BACK BY AND VERIFIED WITH: NOAH GRIFFITH RN AT 1524 ON 07/16/20 SNG  (NOTE) SARS-CoV-2 target nucleic acids are DETECTED  SARS-CoV-2 RNA is generally detectable in upper respiratory specimens  during the acute phase of infection.  Positive results are indicative  of the presence of the identified virus, but do not rule out bacterial infection or co-infection with other pathogens not detected by the test.  Clinical correlation with patient history and  other diagnostic information is necessary to determine patient infection status.  The expected result is negative.  Fact Sheet for Patients:   StrictlyIdeas.no  Fact Sheet for Healthcare Providers:   BankingDealers.co.za    This test is not yet approved or cleared by the [place] FDA and  has been authorized for detection and/or diagnosis of SARS-CoV-2 by FDA under an Emergency Use Authorization (EUA).  This EUA will remain in effect (meaning t his test can be used) for the duration of  the COVID-19 declaration under Section 564(b)(1) of the Act, 21 U.S.C. section 360-bbb-3(b)(1), unless the authorization is terminated or revoked sooner.  Performed at [facility], [address] [place], [place] [address]   Urine Culture     Status: Abnormal   Collection Time: 07/17/20  5:25 PM   Specimen: Urine  Result Value Ref Range Status   Specimen Description   Final    URINE, RANDOM Performed at [facility], [address] [place], [place] [address]    Special Requests   Final    NONE Performed at [facility], [address] [place], [place] [address]    Culture (A)  Final    40,000 COLONIES/mL LACTOBACILLUS SPECIES Standardized susceptibility testing for this organism is not available. Performed at  [facility], [place] [address] [place], [place] [address]    Report Status 07/19/2020 FINAL  Final         Radiology Studies: No results found.      Scheduled Meds: . vitamin C  500 mg Oral Daily  . aspirin EC  81 mg Oral Daily  . atorvastatin  20 mg Oral Daily  . Chlorhexidine Gluconate Cloth  6 each Topical Daily  . cholecalciferol  1,000 Units Oral Daily  . dronabinol  2.5 mg Oral BID AC  . enoxaparin (LOVENOX) injection  40 mg Subcutaneous Q24H  . famotidine  20 mg Oral Daily  . guaiFENesin  600 mg Oral BID  . liothyronine  10 mcg Oral Daily  . mirtazapine  15 mg Oral QHS  . multivitamin with minerals  1 tablet Oral Daily  . vitamin B-12  1,000 mcg Oral Daily  . zinc sulfate  220 mg Oral Daily   Continuous Infusions:   LOS: 8 days    Time spent:40 min    Elzora Cullins, Geraldo Docker, MD [facility] Pager [phone]  If 7PM-7AM, please contact night-coverage [email or web address] Password [facility] 07/25/2020, 5:59 PM

## 2020-07-25 NOTE — TOC Progression Note (Signed)
Transition of Care [facility]) - Progression Note    Patient Details  Name: Emily Wu MRN: [id number] Date of Birth: June 09, 1931  [facility] CM/SW Contact  Shelbie Ammons, RN Phone Number: 07/25/2020, 9:08 AM  Clinical Narrative:   RNCM reached out to patient's daughter [name] by phone, to discuss bed offer and that at this time the offer is made for [facility]. [name] reports that she is familiar with facility and she does not wish to accept this bed at this time. She requests that bed search be extended into [facility] [employer]. She continues to report that it is their wish to take their mother home and they will eventually do that whether she gets better or not but they understand that right now they can't get help in home due to Covid.  RNCM extended bed search into [place] through the hub.     Expected Discharge Plan: [place] Barriers to Discharge: No Barriers Identified  Expected Discharge Plan and Services Expected Discharge Plan: [place] arrangements for the past 2 months: Single Family Home                                       Social Determinants of Health (SDOH) Interventions    Readmission Risk Interventions No flowsheet data found.

## 2020-07-25 NOTE — Progress Notes (Addendum)
Daily Progress Note   Patient Name: Emily Wu       Date: 07/25/2020 DOB: 27-Mar-1931  Age: 86 y.o. MRN#: [id number] Attending Physician: Allie Bossier, MD Primary Care Physician: Crecencio Mc, MD Admit Date: 07/16/2020  Reason for Consultation/Follow-up: Establishing goals of care  Subjective: Patient is on covid isolation. Spoke with RN. Discussed patient's agitation overnight and patient's phone call to daughter stating she was going to die. Per staff, "she is not eating".   Spoke with daughter [name]. She is very tearful. Discussed her diagnoses, poor prognosis, and options. She would like to get her mother home with 24/7 assistance but is having difficulty due to covid. She states she is trying to work through a plan to get through her mother's isolation period. If she chooses SNF, recommend palliative and transition to hospice when family is ready. If she chooses home recommend hospice. She is a candidate for hospice facility if family chooses.    She would like a psychiatry evaluation for her mother as she has been depressed, and now has agitation.  ADDENDUM: Daughter [name] called to say the family is not ready to transition to hospice care. She states they would like to continue to try to treat the treatable and to have a psychiatric evaluation.       Length of Stay: 8  Current Medications: Scheduled Meds:  . vitamin C  500 mg Oral Daily  . aspirin EC  81 mg Oral Daily  . atorvastatin  20 mg Oral Daily  . Chlorhexidine Gluconate Cloth  6 each Topical Daily  . cholecalciferol  1,000 Units Oral Daily  . dronabinol  2.5 mg Oral BID AC  . enoxaparin (LOVENOX) injection  40 mg Subcutaneous Q24H  . famotidine  20 mg Oral Daily  . guaiFENesin  600 mg Oral BID  . liothyronine   10 mcg Oral Daily  . mirtazapine  15 mg Oral QHS  . multivitamin with minerals  1 tablet Oral Daily  . vitamin B-12  1,000 mcg Oral Daily  . zinc sulfate  220 mg Oral Daily    Continuous Infusions:   PRN Meds: acetaminophen, chlorpheniramine-HYDROcodone, guaiFENesin-dextromethorphan, LORazepam, magnesium hydroxide, methocarbamol, ondansetron **OR** ondansetron (ZOFRAN) IV, simethicone, traZODone           Vital Signs: BP (!) 94/35 (BP  Location: Left Arm)   Pulse 64   Temp 98.7 F (37.1 C) (Oral)   Resp 16   Ht 5\' 2"  (1.575 m)   Wt 50.3 kg   SpO2 96%   BMI 20.30 kg/m  SpO2: SpO2: 96 % O2 Device: O2 Device: Room Air O2 Flow Rate:    Intake/output summary:   Intake/Output Summary (Last 24 hours) at 07/25/2020 0902 Last data filed at 07/25/2020 0439 Gross per 24 hour  Intake 50 ml  Output 1400 ml  Net -1350 ml   LBM:   Baseline Weight: Weight: 50.3 kg Most recent weight: Weight: 50.3 kg       Palliative Assessment/Data:      Patient Active Problem List   Diagnosis Date Noted  . Protein-calorie malnutrition, severe 07/24/2020  . COVID-19 virus infection 07/17/2020  . COVID-19 07/16/2020  . Acute pain of left shoulder 06/19/2020  . Abnormal posture 04/26/2020  . Concussion 03/11/2020  . Widowed 03/11/2020  . Osteoarthritis of right knee 08/26/2019  . Orthostatic dizziness 08/18/2019  . S/P left unicompartmental knee replacement 01/26/2019  . Hospital discharge follow-up 12/26/2018  . Incidental pulmonary nodule, > 47mm and < 69mm 12/26/2018  . Chronic kidney disease (CKD), stage III (moderate) 12/26/2018  . Left bundle branch block (LBBB) determined by electrocardiography 12/02/2018  . GERD (gastroesophageal reflux disease) 09/29/2018  . Anxiety disorder 03/25/2018  . Chronic pain of left knee 05/27/2016  . Left carotid bruit 04/14/2016  . Menopausal hot flushes 08/07/2015  . H/O malignant neoplasm of skin 06/25/2015  . Hypothyroidism 01/31/2015  . History  of cervical cancer 01/10/2015  . Hyperlipidemia 02/03/2014  . Medicare annual wellness visit, subsequent 01/19/2014  . History of IBS 01/19/2014  . S/P breast augmentation 01/19/2014  . Primary localized osteoarthritis of left knee 07/06/2012  . Colon polyps 01/30/2012  . Low back pain 01/30/2012    Palliative Care Assessment & Plan    Recommendations/Plan:  Patient is hospice facility or home with hospice appropriate if family chooses. Recommend palliative if she chooses SNF placement.   Recommend psychiatry evaluation.     Code Status:    Code Status Orders  (From admission, onward)         Start     Ordered   07/24/20 1947  Do not attempt resuscitation (DNR)  Continuous       Question Answer Comment  In the event of cardiac or respiratory ARREST Do not call a "code blue"   In the event of cardiac or respiratory ARREST Do not perform Intubation, CPR, defibrillation or ACLS   In the event of cardiac or respiratory ARREST Use medication by any route, position, wound care, and other measures to relive pain and suffering. May use oxygen, suction and manual treatment of airway obstruction as needed for comfort.      07/24/20 1946        Code Status History    Date Active Date Inactive Code Status Order ID Comments User Context   07/16/2020 2227 07/24/2020 1946 Full Code [id number]  Sidney Ace Arvella Merles, MD ED   01/26/2019 1948 01/28/2019 1903 Full Code [id number]  Marchia Bond, MD Inpatient   12/08/2018 1156 12/09/2018 2206 Full Code [id number]  Dustin Flock, MD ED   02/03/2014 1639 02/04/2014 1638 Full Code [id number]  Melton Alar, PA-C Inpatient   Advance Care Planning Activity       Prognosis:  < 2 weeks  Not eating.   Care plan was discussed with CM  Thank  you for allowing the Palliative Medicine Team to assist in the care of this patient.   Time In: 8:50 Time Out: 10:00 Total Time 70 min Prolonged Time Billed  yes       Greater than 50%  of this time was spent  counseling and coordinating care related to the above assessment and plan.  Asencion Gowda, NP  Please contact Palliative Medicine Team phone at [phone] for questions and concerns.

## 2020-07-26 ENCOUNTER — Ambulatory Visit: Payer: Medicare Other | Admitting: Physical Therapy

## 2020-07-26 DIAGNOSIS — F320 Major depressive disorder, single episode, mild: Secondary | ICD-10-CM

## 2020-07-26 DIAGNOSIS — E876 Hypokalemia: Secondary | ICD-10-CM | POA: Diagnosis present

## 2020-07-26 DIAGNOSIS — Z9189 Other specified personal risk factors, not elsewhere classified: Secondary | ICD-10-CM

## 2020-07-26 DIAGNOSIS — E538 Deficiency of other specified B group vitamins: Secondary | ICD-10-CM | POA: Diagnosis present

## 2020-07-26 DIAGNOSIS — F32A Depression, unspecified: Secondary | ICD-10-CM | POA: Diagnosis present

## 2020-07-26 DIAGNOSIS — E43 Unspecified severe protein-calorie malnutrition: Secondary | ICD-10-CM | POA: Diagnosis present

## 2020-07-26 DIAGNOSIS — R911 Solitary pulmonary nodule: Secondary | ICD-10-CM

## 2020-07-26 LAB — CBC WITH DIFFERENTIAL/PLATELET
Abs Immature Granulocytes: 0.03 10*3/uL (ref 0.00–0.07)
Basophils Absolute: 0 10*3/uL (ref 0.0–0.1)
Basophils Relative: 0 %
Eosinophils Absolute: 0.1 10*3/uL (ref 0.0–0.5)
Eosinophils Relative: 1 %
HCT: 36.6 % (ref 36.0–46.0)
Hemoglobin: 12 g/dL (ref 12.0–15.0)
Immature Granulocytes: 0 %
Lymphocytes Relative: 23 %
Lymphs Abs: 1.7 10*3/uL (ref 0.7–4.0)
MCH: 30.5 pg (ref 26.0–34.0)
MCHC: 32.8 g/dL (ref 30.0–36.0)
MCV: 92.9 fL (ref 80.0–100.0)
Monocytes Absolute: 1.1 10*3/uL — ABNORMAL HIGH (ref 0.1–1.0)
Monocytes Relative: 15 %
Neutro Abs: 4.5 10*3/uL (ref 1.7–7.7)
Neutrophils Relative %: 61 %
Platelets: 335 10*3/uL (ref 150–400)
RBC: 3.94 MIL/uL (ref 3.87–5.11)
RDW: 14 % (ref 11.5–15.5)
WBC: 7.5 10*3/uL (ref 4.0–10.5)
nRBC: 0 % (ref 0.0–0.2)

## 2020-07-26 LAB — COMPREHENSIVE METABOLIC PANEL
ALT: 18 U/L (ref 0–44)
AST: 25 U/L (ref 15–41)
Albumin: 2.7 g/dL — ABNORMAL LOW (ref 3.5–5.0)
Alkaline Phosphatase: 78 U/L (ref 38–126)
Anion gap: 6 (ref 5–15)
BUN: 13 mg/dL (ref 8–23)
CO2: 28 mmol/L (ref 22–32)
Calcium: 8.3 mg/dL — ABNORMAL LOW (ref 8.9–10.3)
Chloride: 106 mmol/L (ref 98–111)
Creatinine, Ser: 0.74 mg/dL (ref 0.44–1.00)
GFR calc Af Amer: >60 mL/min (ref >60)
GFR calc non Af Amer: >60 mL/min (ref >60)
Glucose, Bld: 103 mg/dL — ABNORMAL HIGH (ref 70–99)
Potassium: 4.1 mmol/L (ref 3.5–5.1)
Sodium: 140 mmol/L (ref 135–145)
Total Bilirubin: 0.6 mg/dL (ref 0.3–1.2)
Total Protein: 5.9 g/dL — ABNORMAL LOW (ref 6.5–8.1)

## 2020-07-26 LAB — PHOSPHORUS: Phosphorus: 2.8 mg/dL (ref 2.5–4.6)

## 2020-07-26 LAB — MAGNESIUM: Magnesium: 2.1 mg/dL (ref 1.7–2.4)

## 2020-07-26 MED ORDER — DRONABINOL 2.5 MG PO CAPS
5.0000 mg | ORAL_CAPSULE | Freq: Two times a day (BID) | ORAL | Status: DC
  Administered 2020-07-27 – 2020-07-28 (×3): 5 mg via ORAL
  Filled 2020-07-26 (×3): qty 2

## 2020-07-26 NOTE — Progress Notes (Signed)
Physical Therapy Treatment Patient Details Name: Emily Wu MRN: [id number] DOB: 02-22-1931 Today's Date: 07/26/2020    History of Present Illness Emily Wu is an 7yoF who presented to ER secondary to progressing weakness, recurrent diarrhea; admitted for management of COVID-19 viral infection with GI manifestation.    PT Comments    Pt was sitting in recliner on ipad upon arriving. She has cognition deficits that impacts session progression. Pt's emotions all over during session. Crying at first, to pleasant, then to slightly agitated by the end of session. She requires increased time to process and repeated vcs for desired task. Was able to ambulate to doorway and return 2 x with RW prior to demanding to get back into bed. Overall tolerated session well. If pt has 24 hour supervision, may be more beneficial to DC home with HHPT 2/2 to cognition deficits. If 24 hour assistance not available, recommend DC to SNF.   Follow Up Recommendations  SNF    Equipment Recommendations  None recommended by PT    Recommendations for Other Services       Precautions / Restrictions Precautions Precautions: Fall Restrictions Weight Bearing Restrictions: No    Mobility  Bed Mobility Overal bed mobility: Needs Assistance Bed Mobility: Sit to Supine       Sit to supine: Min guard   General bed mobility comments: CGA for safety to progress BLEs into bed  Transfers Overall transfer level: Needs assistance Equipment used: Rolling walker (2 wheeled) Transfers: Sit to/from Stand Sit to Stand: Min guard         General transfer comment: CGA for safety to STS from recliner and from EOB surface  Ambulation/Gait Ambulation/Gait assistance: Min guard Gait Distance (Feet): 25 Feet Assistive device: Rolling walker (2 wheeled)   Gait velocity: decreased   General Gait Details: pt ambulated to doorway and return 2 x 2ft. pt has unsteadiness at time but overall tolerated well. pt  did not respond to cueing very well and gets frustrated easily.     Balance Overall balance assessment: Needs assistance Sitting-balance support: No upper extremity supported;Feet supported Sitting balance-Leahy Scale: Good     Standing balance support: During functional activity;Bilateral upper extremity supported Standing balance-Leahy Scale: Fair        Cognition Arousal/Alertness: Awake/alert Behavior During Therapy: Flat affect;Agitated;Impulsive Overall Cognitive Status: History of cognitive impairments - at baseline           General Comments: Pt has cognition deficits at baseline. she is tearful at times, happy and then aggitated at times. Overall able to follow commands consistently throughout.             Pertinent Vitals/Pain Pain Assessment: No/denies pain           PT Goals (current goals can now be found in the care plan section) Acute Rehab PT Goals Patient Stated Goal: "get out of here" Progress towards PT goals: Progressing toward goals    Frequency    Min 2X/week      PT Plan Current plan remains appropriate       AM-PAC PT "6 Clicks" Mobility   Outcome Measure  Help needed turning from your back to your side while in a flat bed without using bedrails?: A Little Help needed moving from lying on your back to sitting on the side of a flat bed without using bedrails?: A Little Help needed moving to and from a bed to a chair (including a wheelchair)?: A Little Help needed standing up from a  chair using your arms (e.g., wheelchair or bedside chair)?: A Little Help needed to walk in hospital room?: None Help needed climbing 3-5 steps with a railing? : A Little 6 Click Score: 19    End of Session Equipment Utilized During Treatment: Gait belt Activity Tolerance: Patient tolerated treatment well;No increased pain Patient left: in bed;with bed alarm set;with call bell/phone within reach Nurse Communication: Mobility status PT Visit  Diagnosis: Muscle weakness (generalized) (M62.81);Difficulty in walking, not elsewhere classified (R26.2)     Time: 0340-3524 PT Time Calculation (min) (ACUTE ONLY): 17 min  Charges:  $Gait Training: 8-22 mins                     Julaine Fusi PTA 07/26/20, 3:22 PM

## 2020-07-26 NOTE — Progress Notes (Signed)
Pt remains Alert to self and place, forgetful of time and situation but aware of her own forgetfulness. C/O lower back pain x1 treated with prn with good effect. VS stable 96% spo2 on room air. Pt continually sad and anxious being in hospital. This nurse spent time at bedside providing emotional support. Pt daughter [name] face timed and updated on POC. Pt eating 25%-75% of meals with encouragement. Pt up to chair this afternoon and worked with PT. OOB to [facility] with standby assist.

## 2020-07-26 NOTE — Progress Notes (Signed)
PROGRESS NOTE    Emily Wu  NKN:[id number] DOB: [birth year] DOA: 07/16/2020 PCP: Crecencio Mc, MD     Brief Narrative:  Past medical history of HTN, HLD, OA. Presents with complaints of body ache and weakness and diarrhea and found to have COVID-19 illness. Currently plan is continue monitor improvement in oral intake and engage with family regarding goals of care conversation.    Subjective: 9/2 afebrile overnight.  A/O x2 (does not know when, why).  Patient just notes that she wants to go home and spend time at home with her family as much as possible.   Assessment & Plan: Covid vaccination;   Active Problems:   COVID-19   COVID-19 virus infection   Protein-calorie malnutrition, severe  Acute Covid 19 viral infection -Negative O2 requirement -On room air -Completed Remdesivir 8/27 -Baricitinib/Actemra(off-label use):Currently not indicated -Steroids not given secondary to lack of hypoxia steroids: Currently not indicated given lack of hypoxia -Monoclonal antibody received on admission -Vitamin C and zinc per Covid protocol  Pulmonary nodule  -Possible 2 mm pulmonary nodule far below pathologic criteria by Fleischner Society guidelines.  If patient survives can follow-up as outpatient  Severe protein calorie malnutrition -Continues to have poor p.o. intake even with encouragement by staff. -9/2 increase Marinol.   5 mg BID  -Continue regular diet -Dietary consult -And family does not want any type of feeding tube -Family understands that without significant in improvement in p.o. intake patient will only have a few weeks of meaningful life remaining..  Hypokalemia -Resolved   Depression -Remeron 15 mg daily -9/2 psychiatry to see patient and make recommendations I Vitamin B12 deficiency -Vitamin B12 1000 mcg daily  HLD -Lipitor 20 mg daily  Hypothyroidism -Cytomel 10 mcg daily  RIGHT lower extremity edema -Resolved  Goals of care -9/2  spoke at length with Darrel Reach understands that patient will be contagious until~08/05/2020 however wants to bring mother home in order that family may spend time with her during her remaining days.  Understands the safety precautions that will have to be in place to protect the remainder of the family.  States either her husband or niece will pick up mother on Saturday after 1400. -Competent to make medical decisions? -9/1 palliative care recommended psychiatric evaluation to determine if patient can make her own decisions medically..  Have placed order  Addendum: On conversation with the daughter she told me that the patient has a living will that has DNR/DNI written as well as no feeding tube written. CODE STATUS changed in the chart to DNR/DNI. Also she would like the patient to go to the rehab. She is anticipating a call from the [facility] team tomorrow. -9/1 per palliative care physician note they would like a psychiatry consult which has been placed.  Will attempt to contact palliative care in the a.m. to clarify conversation between daughter patient and palliative care    DVT prophylaxis: Lovenox Code Status: DNR Family Communication: 9/2 spoke at length with Darrel Reach daughter discussed plan of care answered all questions. Status is: Inpatient    Dispo: The patient is from: Home              Anticipated d/c is to: Home              Anticipated d/c date is: 9/4              Patient currently awaiting palliative care, NCM recommendations for PT/OT/aide.     Consultants:  Palliative care  Procedures/Significant Events:  8/23 DG chest;-2 mm nodular density projecting over the right upper lung on the PA radiograph is favored to reflect a vascular summation shadow.   I have personally reviewed and interpreted all radiology studies and my findings are as above.  VENTILATOR SETTINGS:    Cultures   Antimicrobials: Anti-infectives (From admission, onward)   Start      Ordered Stop   07/17/20 1000  remdesivir 100 mg in sodium chloride 0.9 % 100 mL IVPB       "Followed by" Linked Group Details   07/16/20 2145 07/20/20 1019   07/17/20 1000  remdesivir 100 mg in sodium chloride 0.9 % 100 mL IVPB  Status:  Discontinued       "Followed by" Linked Group Details   07/16/20 2227 07/16/20 2231   07/16/20 2230  remdesivir 200 mg in sodium chloride 0.9% 250 mL IVPB  Status:  Discontinued       "Followed by" Linked Group Details   07/16/20 2227 07/16/20 2231   07/16/20 2145  remdesivir 200 mg in sodium chloride 0.9% 250 mL IVPB       "Followed by" Linked Group Details   07/16/20 2145 07/17/20 0237       Devices    LINES / TUBES:      Continuous Infusions:   Objective: Vitals:   07/25/20 1641 07/25/20 2006 07/26/20 0040 07/26/20 0800  BP: (!) 103/41 (!) 103/55 (!) 113/52 107/83  Pulse: 83 72 79 70  Resp: 16 18 20 18   Temp: 97.6 F (36.4 C) 97.9 F (36.6 C) 98.9 F (37.2 C) 97.9 F (36.6 C)  TempSrc: Oral Oral  Oral  SpO2: 100% 99% 98% 97%  Weight:      Height:        Intake/Output Summary (Last 24 hours) at 07/26/2020 2202 Last data filed at 07/25/2020 1800 Gross per 24 hour  Intake 240 ml  Output --  Net 240 ml   Filed Weights   07/16/20 0825  Weight: 50.3 kg   Physical Exam:  General: A/O x2 (does not know when, why) No acute respiratory distress Eyes: negative scleral hemorrhage, negative anisocoria, negative icterus ENT: Negative Runny nose, negative gingival bleeding, Neck:  Negative scars, masses, torticollis, lymphadenopathy, JVD Lungs: Clear to auscultation bilaterally without wheezes or crackles Cardiovascular: Regular rate and rhythm without murmur gallop or rub normal S1 and S2 Abdomen: negative abdominal pain, nondistended, positive soft, bowel sounds, no rebound, no ascites, no appreciable mass Extremities: No significant cyanosis, clubbing, or edema bilateral lower extremities Skin: Negative rashes, lesions,  ulcers Psychiatric:  Negative depression, negative anxiety, negative fatigue, negative mania  Central nervous system:  Cranial nerves II through XII intact, tongue/uvula midline, all extremities muscle strength 4/5, sensation intact throughout, negative dysarthria, negative expressive aphasia, negative receptive aphasia.  .     Data Reviewed: Care during the described time interval was provided by me .  I have reviewed this patient's available data, including medical history, events of note, physical examination, and all test results as part of my evaluation.  CBC: Recent Labs  Lab 07/20/20 0448 07/21/20 0404 07/23/20 0734 07/26/20 0444  WBC 4.7 7.9 10.2 7.5  NEUTROABS 1.7 5.1  --  4.5  HGB 13.1 13.4 12.8 12.0  HCT 38.5 39.1 38.1 36.6  MCV 89.3 88.9 92.9 92.9  PLT 217 257 298 542   Basic Metabolic Panel: Recent Labs  Lab 07/20/20 0448 07/21/20 0404 07/23/20 0734 07/25/20 0510 07/26/20 0444  NA 142 141  141 138 140  K 4.0 3.6 3.8 3.6 4.1  CL 105 104 105 104 106  CO2 28 31 31 26 28   GLUCOSE 108* 105* 96 112* 103*  BUN 12 15 22 15 13   CREATININE 0.74 0.77 0.93 0.92 0.74  CALCIUM 8.6* 8.0* 8.1* 8.5* 8.3*  MG  --   --  2.1  --  2.1  PHOS  --   --   --   --  2.8   GFR: Estimated Creatinine Clearance: 39.2 mL/min (by C-G formula based on SCr of 0.74 mg/dL). Liver Function Tests: Recent Labs  Lab 07/20/20 0448 07/21/20 0404 07/23/20 0734 07/26/20 0444  AST 42* 34 24 25  ALT 26 25 19 18   ALKPHOS 56 61 82 78  BILITOT 0.8 0.8 0.9 0.6  PROT 5.7* 5.7* 5.9* 5.9*  ALBUMIN 2.9* 3.0* 2.9* 2.7*   No results for input(s): LIPASE, AMYLASE in the last 168 hours. No results for input(s): AMMONIA in the last 168 hours. Coagulation Profile: No results for input(s): INR, PROTIME in the last 168 hours. Cardiac Enzymes: No results for input(s): CKTOTAL, CKMB, CKMBINDEX, TROPONINI in the last 168 hours. BNP (last 3 results) No results for input(s): PROBNP in the last 8760  hours. HbA1C: No results for input(s): HGBA1C in the last 72 hours. CBG: No results for input(s): GLUCAP in the last 168 hours. Lipid Profile: No results for input(s): CHOL, HDL, LDLCALC, TRIG, CHOLHDL, LDLDIRECT in the last 72 hours. Thyroid Function Tests: No results for input(s): TSH, T4TOTAL, FREET4, T3FREE, THYROIDAB in the last 72 hours. Anemia Panel: No results for input(s): VITAMINB12, FOLATE, FERRITIN, TIBC, IRON, RETICCTPCT in the last 72 hours. Sepsis Labs: No results for input(s): PROCALCITON, LATICACIDVEN in the last 168 hours.  Recent Results (from the past 240 hour(s))  SARS Coronavirus 2 by RT PCR (hospital order, performed in [facility] hospital lab) Nasopharyngeal Nasopharyngeal Swab     Status: Abnormal   Collection Time: 07/16/20  1:16 PM   Specimen: Nasopharyngeal Swab  Result Value Ref Range Status   SARS Coronavirus 2 POSITIVE (A) NEGATIVE Final    Comment: RESULT CALLED TO, READ BACK BY AND VERIFIED WITH: NOAH GRIFFITH RN AT 1524 ON 07/16/20 SNG  (NOTE) SARS-CoV-2 target nucleic acids are DETECTED  SARS-CoV-2 RNA is generally detectable in upper respiratory specimens  during the acute phase of infection.  Positive results are indicative  of the presence of the identified virus, but do not rule out bacterial infection or co-infection with other pathogens not detected by the test.  Clinical correlation with patient history and  other diagnostic information is necessary to determine patient infection status.  The expected result is negative.  Fact Sheet for Patients:   StrictlyIdeas.no   Fact Sheet for Healthcare Providers:   BankingDealers.co.za    This test is not yet approved or cleared by the [place] FDA and  has been authorized for detection and/or diagnosis of SARS-CoV-2 by FDA under an Emergency Use Authorization (EUA).  This EUA will remain in effect (meaning t his test can be used) for the  duration of  the COVID-19 declaration under Section 564(b)(1) of the Act, 21 U.S.C. section 360-bbb-3(b)(1), unless the authorization is terminated or revoked sooner.  Performed at [facility], [address], [place], [place] [address]   Urine Culture     Status: Abnormal   Collection Time: 07/17/20  5:25 PM   Specimen: Urine  Result Value Ref Range Status   Specimen Description  Final    URINE, RANDOM Performed at [facility], [address], [place], [place] [address]    Special Requests   Final    NONE Performed at [facility], [address] [place], [place] [address]    Culture (A)  Final    40,000 COLONIES/mL LACTOBACILLUS SPECIES Standardized susceptibility testing for this organism is not available. Performed at [facility], [place] [address], [place], [place] [address]    Report Status 07/19/2020 FINAL  Final         Radiology Studies: No results found.      Scheduled Meds: . vitamin C  500 mg Oral Daily  . aspirin EC  81 mg Oral Daily  . atorvastatin  20 mg Oral Daily  . Chlorhexidine Gluconate Cloth  6 each Topical Daily  . cholecalciferol  1,000 Units Oral Daily  . dronabinol  2.5 mg Oral BID AC  . enoxaparin (LOVENOX) injection  40 mg Subcutaneous Q24H  . famotidine  20 mg Oral Daily  . guaiFENesin  600 mg Oral BID  . liothyronine  10 mcg Oral Daily  . mirtazapine  15 mg Oral QHS  . multivitamin with minerals  1 tablet Oral Daily  . vitamin B-12  1,000 mcg Oral Daily  . zinc sulfate  220 mg Oral Daily   Continuous Infusions:   LOS: 9 days    Time spent:40 min    Deniese Oberry, Geraldo Docker, MD [facility] Pager [phone]  If 7PM-7AM, please contact night-coverage [email or web address] Password [email or web address] 07/26/2020, 8:08 AM

## 2020-07-26 NOTE — Progress Notes (Addendum)
Daily Progress Note   Patient Name: Emily Wu       Date: 07/26/2020 DOB: 1931-10-13  Age: 86 y.o. MRN#: [id number] Attending Physician: Allie Bossier, MD Primary Care Physician: Crecencio Mc, MD Admit Date: 07/16/2020  Reason for Consultation/Follow-up: Establishing goals of care  Subjective: Patient is on covid isolation. Spoke with SW who states care at home can be initiated Saturday. Spoke with daughter. She states they would like to bring patient home and have PT. They are interested in psychiatric evaluation for her depression, which is pending.   Will call again tomorrow.       Length of Stay: 9  Current Medications: Scheduled Meds:  . vitamin C  500 mg Oral Daily  . aspirin EC  81 mg Oral Daily  . atorvastatin  20 mg Oral Daily  . Chlorhexidine Gluconate Cloth  6 each Topical Daily  . cholecalciferol  1,000 Units Oral Daily  . dronabinol  2.5 mg Oral BID AC  . enoxaparin (LOVENOX) injection  40 mg Subcutaneous Q24H  . famotidine  20 mg Oral Daily  . guaiFENesin  600 mg Oral BID  . liothyronine  10 mcg Oral Daily  . mirtazapine  15 mg Oral QHS  . multivitamin with minerals  1 tablet Oral Daily  . vitamin B-12  1,000 mcg Oral Daily  . zinc sulfate  220 mg Oral Daily    Continuous Infusions:   PRN Meds: acetaminophen, chlorpheniramine-HYDROcodone, guaiFENesin-dextromethorphan, LORazepam, magnesium hydroxide, methocarbamol, ondansetron **OR** ondansetron (ZOFRAN) IV, simethicone, traZODone           Vital Signs: BP 107/83 (BP Location: Right Arm)   Pulse 70   Temp 97.9 F (36.6 C) (Oral)   Resp 18   Ht 5\' 2"  (1.575 m)   Wt 50.3 kg   SpO2 97%   BMI 20.30 kg/m  SpO2: SpO2: 97 % O2 Device: O2 Device: Room Air O2 Flow Rate:    Intake/output summary:     Intake/Output Summary (Last 24 hours) at 07/26/2020 0939 Last data filed at 07/25/2020 1800 Gross per 24 hour  Intake 240 ml  Output --  Net 240 ml   LBM:   Baseline Weight: Weight: 50.3 kg Most recent weight: Weight: 50.3 kg       Palliative Assessment/Data:    Flowsheet Rows  Most Recent Value  Intake Tab  Referral Department Hospitalist  Unit at Time of Referral Med/Surg Unit  Palliative Care Primary Diagnosis Sepsis/Infectious Disease  Date Notified 07/23/20  Palliative Care Type New Palliative care  Reason for referral Clarify Goals of Care  Date of Admission 07/16/20  Date first seen by Palliative Care 07/24/20  # of days Palliative referral response time 1 Day(s)  # of days IP prior to Palliative referral 7  Clinical Assessment  Psychosocial & Spiritual Assessment  Palliative Care Outcomes      Patient Active Problem List   Diagnosis Date Noted  . Protein-calorie malnutrition, severe 07/24/2020  . COVID-19 virus infection 07/17/2020  . COVID-19 07/16/2020  . Acute pain of left shoulder 06/19/2020  . Abnormal posture 04/26/2020  . Concussion 03/11/2020  . Widowed 03/11/2020  . Osteoarthritis of right knee 08/26/2019  . Orthostatic dizziness 08/18/2019  . S/P left unicompartmental knee replacement 01/26/2019  . Hospital discharge follow-up 12/26/2018  . Incidental pulmonary nodule, > 87mm and < 18mm 12/26/2018  . Chronic kidney disease (CKD), stage III (moderate) 12/26/2018  . Left bundle branch block (LBBB) determined by electrocardiography 12/02/2018  . GERD (gastroesophageal reflux disease) 09/29/2018  . Anxiety disorder 03/25/2018  . Chronic pain of left knee 05/27/2016  . Left carotid bruit 04/14/2016  . Menopausal hot flushes 08/07/2015  . H/O malignant neoplasm of skin 06/25/2015  . Hypothyroidism 01/31/2015  . History of cervical cancer 01/10/2015  . Hyperlipidemia 02/03/2014  . Medicare annual wellness visit, subsequent 01/19/2014  . History  of IBS 01/19/2014  . S/P breast augmentation 01/19/2014  . Primary localized osteoarthritis of left knee 07/06/2012  . Colon polyps 01/30/2012  . Low back pain 01/30/2012    Palliative Care Assessment & Plan    Recommendations/Plan:   Recommend psychiatry evaluation.   Will follow up tomorrow.     Code Status:    Code Status Orders  (From admission, onward)         Start     Ordered   07/24/20 1947  Do not attempt resuscitation (DNR)  Continuous       Question Answer Comment  In the event of cardiac or respiratory ARREST Do not call a "code blue"   In the event of cardiac or respiratory ARREST Do not perform Intubation, CPR, defibrillation or ACLS   In the event of cardiac or respiratory ARREST Use medication by any route, position, wound care, and other measures to relive pain and suffering. May use oxygen, suction and manual treatment of airway obstruction as needed for comfort.      07/24/20 1946        Code Status History    Date Active Date Inactive Code Status Order ID Comments User Context   07/16/2020 2227 07/24/2020 1946 Full Code [id number]  Sidney Ace Arvella Merles, MD ED   01/26/2019 1948 01/28/2019 1903 Full Code [id number]  Marchia Bond, MD Inpatient   12/08/2018 1156 12/09/2018 2206 Full Code [id number]  Dustin Flock, MD ED   02/03/2014 1639 02/04/2014 1638 Full Code [id number]  Melton Alar, PA-C Inpatient   Advance Care Planning Activity       Prognosis: < 6 months if continued poor appetite.    Care plan was discussed with CM,   Thank you for allowing the Palliative Medicine Team to assist in the care of this patient.   Time In: 8:50 Time Out: 9:50 Total Time 60 min Prolonged Time Billed no       Greater  than 50%  of this time was spent counseling and coordinating care related to the above assessment and plan.  COVID-19 DISASTER DECLARATION:    FULL CONTACT PHYSICAL EXAMINATION WAS NOT POSSIBLE DUE TO TREATMENT OF COVID-19  AND CONSERVATION OF PERSONAL  PROTECTIVE EQUIPMENT   Patient assessed or the symptoms described in the history of present illness.  In the context of the Global COVID-19 pandemic, which necessitated consideration that the patient might be at risk for infection with the SARS-CoV-2 virus that causes COVID-19, Institutional protocols and algorithms that pertain to the evaluation of patients at risk for COVID-19 are in a state of rapid change based on information released by regulatory bodies including the CDC and federal and state organizations. These policies and algorithms were followed during the patient's care while in hospital.  Asencion Gowda, NP  Please contact Palliative Medicine Team phone at [phone] for questions and concerns.

## 2020-07-27 LAB — COMPREHENSIVE METABOLIC PANEL
ALT: 20 U/L (ref 0–44)
AST: 31 U/L (ref 15–41)
Albumin: 2.7 g/dL — ABNORMAL LOW (ref 3.5–5.0)
Alkaline Phosphatase: 81 U/L (ref 38–126)
Anion gap: 8 (ref 5–15)
BUN: 13 mg/dL (ref 8–23)
CO2: 28 mmol/L (ref 22–32)
Calcium: 8.4 mg/dL — ABNORMAL LOW (ref 8.9–10.3)
Chloride: 104 mmol/L (ref 98–111)
Creatinine, Ser: 1.02 mg/dL — ABNORMAL HIGH (ref 0.44–1.00)
GFR calc Af Amer: 57 mL/min — ABNORMAL LOW (ref >60)
GFR calc non Af Amer: 49 mL/min — ABNORMAL LOW (ref >60)
Glucose, Bld: 99 mg/dL (ref 70–99)
Potassium: 4.2 mmol/L (ref 3.5–5.1)
Sodium: 140 mmol/L (ref 135–145)
Total Bilirubin: 0.7 mg/dL (ref 0.3–1.2)
Total Protein: 6 g/dL — ABNORMAL LOW (ref 6.5–8.1)

## 2020-07-27 LAB — CBC WITH DIFFERENTIAL/PLATELET
Abs Immature Granulocytes: 0.03 10*3/uL (ref 0.00–0.07)
Basophils Absolute: 0 10*3/uL (ref 0.0–0.1)
Basophils Relative: 1 %
Eosinophils Absolute: 0.1 10*3/uL (ref 0.0–0.5)
Eosinophils Relative: 1 %
HCT: 36.1 % (ref 36.0–46.0)
Hemoglobin: 12.1 g/dL (ref 12.0–15.0)
Immature Granulocytes: 0 %
Lymphocytes Relative: 27 %
Lymphs Abs: 2 10*3/uL (ref 0.7–4.0)
MCH: 30.7 pg (ref 26.0–34.0)
MCHC: 33.5 g/dL (ref 30.0–36.0)
MCV: 91.6 fL (ref 80.0–100.0)
Monocytes Absolute: 1 10*3/uL (ref 0.1–1.0)
Monocytes Relative: 14 %
Neutro Abs: 4.2 10*3/uL (ref 1.7–7.7)
Neutrophils Relative %: 57 %
Platelets: 350 10*3/uL (ref 150–400)
RBC: 3.94 MIL/uL (ref 3.87–5.11)
RDW: 14.3 % (ref 11.5–15.5)
WBC: 7.3 10*3/uL (ref 4.0–10.5)
nRBC: 0 % (ref 0.0–0.2)

## 2020-07-27 LAB — PHOSPHORUS: Phosphorus: 3.4 mg/dL (ref 2.5–4.6)

## 2020-07-27 LAB — MAGNESIUM: Magnesium: 2.2 mg/dL (ref 1.7–2.4)

## 2020-07-27 NOTE — Progress Notes (Signed)
Pt remains Alert to self and place, forgetful of time and situation but aware of her own forgetfulness.  C/O lower back pain x1 treated with prn with good effect. VS stable 96%-98% spo2 on room air. Pt intermittently sad and anxious about being in hospital. This nurse spent time at bedside providing emotional support. Pt daughter [name] face timed and updated on POC. Pt appetite improving eating 50%-75% of meals with encouragement. OOB to [facility] with standby assist.

## 2020-07-27 NOTE — Progress Notes (Signed)
PROGRESS NOTE    CARIME DINKEL  OJJ:[id number] DOB: 07/01/1931 DOA: 07/16/2020 PCP: Crecencio Mc, MD     Brief Narrative:  Past medical history of HTN, HLD, OA. Presents with complaints of body ache and weakness and diarrhea and found to have COVID-19 illness. Currently plan is continue monitor improvement in oral intake and engage with family regarding goals of care conversation.    Subjective: 9/3 afebrile overnight, A/O x2 (does not know when, why) patient's only concern is that she request to be discharged home   Assessment & Plan: Covid vaccination;   Active Problems:   COVID-19   COVID-19 virus infection   Protein-calorie malnutrition, severe   Pulmonary nodule less than 1 cm in diameter with low risk for malignant neoplasm   Severe protein-calorie malnutrition (HCC)   Hypokalemia   Depression   Vitamin B12 deficiency  Acute Covid 19 viral infection -Negative O2 requirement -On room air -Completed Remdesivir 8/27 -Baricitinib/Actemra(off-label use):Currently not indicated -Steroids not given secondary to lack of hypoxia steroids: Currently not indicated given lack of hypoxia -Monoclonal antibody received on admission -Vitamin C and zinc per Covid protocol -9/3;Perform ambulatory SPO2 place findings in appropriate epic note for insurance purposes.  Contact physician when complete  Pulmonary nodule  -Possible 2 mm pulmonary nodule far below pathologic criteria by Fleischner Society guidelines.  If patient survives can follow-up as outpatient  Severe protein calorie malnutrition -Continues to have poor p.o. intake even with encouragement by staff. -9/2 increase Marinol.   5 mg BID  -Continue regular diet -Dietary consult -And family does not want any type of feeding tube -Family understands that without significant in improvement in p.o. intake patient will only have a few weeks of meaningful life remaining..  Hypokalemia -Resolved    Depression -Remeron 15 mg daily -9/2 psychiatry to see patient and make recommendations I Vitamin B12 deficiency -Vitamin B12 1000 mcg daily  HLD -Lipitor 20 mg daily  Hypothyroidism -Cytomel 10 mcg daily  RIGHT lower extremity edema -Resolved  Goals of care -9/2 spoke at length with Darrel Reach understands that patient will be contagious until~08/05/2020 however wants to bring mother home in order that family may spend time with her during her remaining days.  Understands the safety precautions that will have to be in place to protect the remainder of the family.  States either her husband or niece will pick up mother on Saturday after 1400. -Competent to make medical decisions? -9/1 palliative care recommended psychiatric evaluation to determine if patient can make her own decisions medically..  Have placed order  Addendum: On conversation with the daughter she told me that the patient has a living will that has DNR/DNI written as well as no feeding tube written. CODE STATUS changed in the chart to DNR/DNI. Also she would like the patient to go to the rehab. She is anticipating a call from the [facility] team tomorrow. -9/1 per palliative care physician note they would like a psychiatry consult which has been placed.  Will attempt to contact palliative care in the a.m. to clarify conversation between daughter patient and palliative care -9/3 patient will be discharged on 9/4 palliative care aware of plan.    DVT prophylaxis: Lovenox Code Status: DNR Family Communication: 9/2 spoke at length with Darrel Reach daughter discussed plan of care answered all questions. Status is: Inpatient    Dispo: The patient is from: Home              Anticipated d/c is to:  Home              Anticipated d/c date is: 9/4              Patient currently awaiting palliative care, NCM recommendations for PT/OT/aide.     Consultants:  Palliative care  Procedures/Significant Events:  8/23  DG chest;-2 mm nodular density projecting over the right upper lung on the PA radiograph is favored to reflect a vascular summation shadow.   I have personally reviewed and interpreted all radiology studies and my findings are as above.  VENTILATOR SETTINGS: On room air 9/3   Cultures   Antimicrobials: Anti-infectives (From admission, onward)   Start     Ordered Stop   07/17/20 1000  remdesivir 100 mg in sodium chloride 0.9 % 100 mL IVPB       "Followed by" Linked Group Details   07/16/20 2145 07/20/20 1019   07/17/20 1000  remdesivir 100 mg in sodium chloride 0.9 % 100 mL IVPB  Status:  Discontinued       "Followed by" Linked Group Details   07/16/20 2227 07/16/20 2231   07/16/20 2230  remdesivir 200 mg in sodium chloride 0.9% 250 mL IVPB  Status:  Discontinued       "Followed by" Linked Group Details   07/16/20 2227 07/16/20 2231   07/16/20 2145  remdesivir 200 mg in sodium chloride 0.9% 250 mL IVPB       "Followed by" Linked Group Details   07/16/20 2145 07/17/20 0237       Devices    LINES / TUBES:      Continuous Infusions:   Objective: Vitals:   07/26/20 2229 07/27/20 0732 07/27/20 1146 07/27/20 1619  BP: (!) 112/44 (!) 101/54 (!) 109/40 (!) 88/72  Pulse: 75 70 91 81  Resp:  14 16 17   Temp:  98.5 F (36.9 C) 98.6 F (37 C) 98.2 F (36.8 C)  TempSrc: Oral Oral Oral Axillary  SpO2: 99% 95% 98% 100%  Weight:      Height:        Intake/Output Summary (Last 24 hours) at 07/27/2020 1705 Last data filed at 07/27/2020 1100 Gross per 24 hour  Intake --  Output 1 ml  Net -1 ml   Filed Weights   07/16/20 0825  Weight: 50.3 kg   Physical Exam:  General: A/O x2 (does not know when, why) No acute respiratory distress Eyes: negative scleral hemorrhage, negative anisocoria, negative icterus ENT: Negative Runny nose, negative gingival bleeding, Neck:  Negative scars, masses, torticollis, lymphadenopathy, JVD Lungs: Clear to auscultation bilaterally without  wheezes or crackles Cardiovascular: Regular rate and rhythm without murmur gallop or rub normal S1 and S2 Abdomen: negative abdominal pain, nondistended, positive soft, bowel sounds, no rebound, no ascites, no appreciable mass Extremities: No significant cyanosis, clubbing, or edema bilateral lower extremities Skin: Negative rashes, lesions, ulcers Psychiatric:  Negative depression, negative anxiety, negative fatigue, negative mania  Central nervous system:  Cranial nerves II through XII intact, tongue/uvula midline, all extremities muscle strength 4/5, sensation intact throughout, negative dysarthria, negative expressive aphasia, negative receptive aphasia.  .     Data Reviewed: Care during the described time interval was provided by me .  I have reviewed this patient's available data, including medical history, events of note, physical examination, and all test results as part of my evaluation.  CBC: Recent Labs  Lab 07/21/20 0404 07/23/20 0734 07/26/20 0444 07/27/20 0713  WBC 7.9 10.2 7.5 7.3  NEUTROABS 5.1  --  4.5 4.2  HGB 13.4 12.8 12.0 12.1  HCT 39.1 38.1 36.6 36.1  MCV 88.9 92.9 92.9 91.6  PLT 257 298 335 092   Basic Metabolic Panel: Recent Labs  Lab 07/21/20 0404 07/23/20 0734 07/25/20 0510 07/26/20 0444 07/27/20 0713  NA 141 141 138 140 140  K 3.6 3.8 3.6 4.1 4.2  CL 104 105 104 106 104  CO2 31 31 26 28 28   GLUCOSE 105* 96 112* 103* 99  BUN 15 22 15 13 13   CREATININE 0.77 0.93 0.92 0.74 1.02*  CALCIUM 8.0* 8.1* 8.5* 8.3* 8.4*  MG  --  2.1  --  2.1 2.2  PHOS  --   --   --  2.8 3.4   GFR: Estimated Creatinine Clearance: 30.7 mL/min (A) (by C-G formula based on SCr of 1.02 mg/dL (H)). Liver Function Tests: Recent Labs  Lab 07/21/20 0404 07/23/20 0734 07/26/20 0444 07/27/20 0713  AST 34 24 25 31   ALT 25 19 18 20   ALKPHOS 61 82 78 81  BILITOT 0.8 0.9 0.6 0.7  PROT 5.7* 5.9* 5.9* 6.0*  ALBUMIN 3.0* 2.9* 2.7* 2.7*   No results for input(s): LIPASE,  AMYLASE in the last 168 hours. No results for input(s): AMMONIA in the last 168 hours. Coagulation Profile: No results for input(s): INR, PROTIME in the last 168 hours. Cardiac Enzymes: No results for input(s): CKTOTAL, CKMB, CKMBINDEX, TROPONINI in the last 168 hours. BNP (last 3 results) No results for input(s): PROBNP in the last 8760 hours. HbA1C: No results for input(s): HGBA1C in the last 72 hours. CBG: No results for input(s): GLUCAP in the last 168 hours. Lipid Profile: No results for input(s): CHOL, HDL, LDLCALC, TRIG, CHOLHDL, LDLDIRECT in the last 72 hours. Thyroid Function Tests: No results for input(s): TSH, T4TOTAL, FREET4, T3FREE, THYROIDAB in the last 72 hours. Anemia Panel: No results for input(s): VITAMINB12, FOLATE, FERRITIN, TIBC, IRON, RETICCTPCT in the last 72 hours. Sepsis Labs: No results for input(s): PROCALCITON, LATICACIDVEN in the last 168 hours.  Recent Results (from the past 240 hour(s))  Urine Culture     Status: Abnormal   Collection Time: 07/17/20  5:25 PM   Specimen: Urine  Result Value Ref Range Status   Specimen Description   Final    URINE, RANDOM Performed at [facility], [address] [place], [place] [address]    Special Requests   Final    NONE Performed at [facility], [address] [place], [place] [address]    Culture (A)  Final    40,000 COLONIES/mL LACTOBACILLUS SPECIES Standardized susceptibility testing for this organism is not available. Performed at [facility], [place] [address] [place], [place] [address]    Report Status 07/19/2020 FINAL  Final         Radiology Studies: No results found.      Scheduled Meds: . vitamin C  500 mg Oral Daily  . aspirin EC  81 mg Oral Daily  . atorvastatin  20 mg Oral Daily  . Chlorhexidine Gluconate Cloth  6 each Topical Daily  . cholecalciferol  1,000 Units Oral Daily  . dronabinol  5 mg Oral BID AC  . enoxaparin (LOVENOX) injection  40 mg  Subcutaneous Q24H  . famotidine  20 mg Oral Daily  . guaiFENesin  600 mg Oral BID  . liothyronine  10 mcg Oral Daily  . mirtazapine  15 mg Oral QHS  . multivitamin with minerals  1 tablet Oral Daily  .  vitamin B-12  1,000 mcg Oral Daily  . zinc sulfate  220 mg Oral Daily   Continuous Infusions:   LOS: 10 days    Time spent:40 min    Karrington Mccravy, Geraldo Docker, MD [facility] Pager [phone]  If 7PM-7AM, please contact night-coverage [email or web address] Password [email or web address] 07/27/2020, 5:05 PM

## 2020-07-27 NOTE — Progress Notes (Signed)
Pt sp02 94%-96% on room air when ambulating

## 2020-07-27 NOTE — TOC Progression Note (Signed)
Transition of Care [facility]) - Progression Note    Patient Details  Name: Emily Wu MRN: [id number] Date of Birth: December 28, 1930  Transition of Care [facility]) CM/SW [address], RN Phone Number: 07/27/2020, 2:21 PM  Clinical Narrative:   RNCM reached out to patient's daughter [name] to discuss discharge planning. [name] reports that 24/7 care has been arranged and they will be able to start tomorrow. [name] reports that her husband will be picking patient up as he is the only one that has been vaccinated and that he will have to come when he gets off work around 3 as long as she is ready for discharge. She requests that home health be set up for therapy for patient but does not have preference as to agency.  RNCM reached out to [place] with [facility] and she accepted referral.     Expected Discharge Plan: [place] Barriers to Discharge: No Barriers Identified  Expected Discharge Plan and Services Expected Discharge Plan: [place] arrangements for the past 2 months: Single Family Home                                       Social Determinants of Health (SDOH) Interventions    Readmission Risk Interventions No flowsheet data found.

## 2020-07-27 NOTE — Progress Notes (Signed)
[facility] hospital Liaison note:  New referral for [facility] community Palliative program to follow at home received from [facility]. Patient information given to referral. Thank you. Flo Shanks BSN, RN, [place] [phone]

## 2020-07-27 NOTE — Progress Notes (Signed)
Perform ambulatory SPO2 place findings in appropriate epic note for insurance purposes.  Contact physician when complete

## 2020-07-27 NOTE — Plan of Care (Addendum)
Patient continues to await psychiatry consult. Per CM, plans in place for home health on D/C. Recommend palliative at D/C. Spoke with [name]. All questions answered.

## 2020-07-27 NOTE — Progress Notes (Signed)
Physical Therapy Treatment Patient Details Name: Emily Wu MRN: [id number] DOB: October 03, 1931 Today's Date: 07/27/2020    History of Present Illness Varina Hulon is an 71yoF who presented to ER secondary to progressing weakness, recurrent diarrhea; admitted for management of COVID-19 viral infection with GI manifestation.    PT Comments    Pt in bed upon entry, more lethargic and confused than previous days. Pt is agreeable to session. Moving well to EOB, but requires min-mod lift assist for STS c RW, really struggles with standing upright, frequently reverts to grabbing front of walker, flexing trunk and resting elbows on handles (maintained flexed knees), cued several times to abate. Attempts to AMB are not successful, pt essentially melting into an unsafe posture. Pt assisted back to recliner, then SPT to [facility] per request to void, and back to recliner. Pt awake at exit but still appears fairly drowsy. Pt able to AMB 2x25 yesterday, but in a very different state at present.        Follow Up Recommendations  SNF     Equipment Recommendations  None recommended by PT    Recommendations for Other Services       Precautions / Restrictions Precautions Precautions: Fall    Mobility  Bed Mobility Overal bed mobility: Modified Independent Bed Mobility: Supine to Sit     Supine to sit: Modified independent (Device/Increase time)     General bed mobility comments: repeated cues for initiation  Transfers Overall transfer level: Needs assistance Equipment used: Rolling walker (2 wheeled) Transfers: Sit to/from [facility] Sit to Stand: Mod assist Stand pivot transfers: Mod assist       General transfer comment: very weak, awful safety awareness compromising lines/leads, not using RW in a safe manner  Ambulation/Gait Ambulation/Gait assistance: Min guard Gait Distance (Feet): 4 Feet Assistive device: Rolling walker (2 wheeled)       General Gait  Details: Pt struggling with upright posture   Stairs             Wheelchair Mobility    Modified [place] (Stroke Patients Only)       Balance Overall balance assessment: Needs assistance   Sitting balance-Leahy Scale: Fair     Standing balance support: Bilateral upper extremity supported;During functional activity Standing balance-Leahy Scale: Zero                              Cognition Arousal/Alertness: Lethargic Behavior During Therapy: Flat affect                                   General Comments: difficulty attending to task, appears to almost fall asleep in session several times, but unable to confirm due to eye snot visualized.      Exercises General Exercises - Lower Extremity Heel Slides: AAROM;Both;15 reps;Supine    General Comments        Pertinent Vitals/Pain Pain Assessment: No/denies pain    Home Living                      Prior Function            PT Goals (current goals can now be found in the care plan section) Acute Rehab PT Goals Patient Stated Goal: "get out of here" PT Goal Formulation: With patient Time For Goal Achievement: 08/01/20 Potential to Achieve Goals: Poor Progress towards  PT goals: Not progressing toward goals - comment    Frequency    Min 2X/week      PT Plan Current plan remains appropriate    Co-evaluation              AM-PAC PT "6 Clicks" Mobility   Outcome Measure  Help needed turning from your back to your side while in a flat bed without using bedrails?: A Little Help needed moving from lying on your back to sitting on the side of a flat bed without using bedrails?: A Little Help needed moving to and from a bed to a chair (including a wheelchair)?: A Lot Help needed standing up from a chair using your arms (e.g., wheelchair or bedside chair)?: A Lot Help needed to walk in hospital room?: A Lot Help needed climbing 3-5 steps with a railing? : Total 6  Click Score: 13    End of Session Equipment Utilized During Treatment: Gait belt Activity Tolerance: Patient tolerated treatment well;No increased pain Patient left: with call bell/phone within reach;in chair;with chair alarm set   PT Visit Diagnosis: Muscle weakness (generalized) (M62.81);Difficulty in walking, not elsewhere classified (R26.2)     Time: 7416-3845 PT Time Calculation (min) (ACUTE ONLY): 29 min  Charges:  $Therapeutic Activity: 23-37 mins                     4:24 PM, 07/27/20 Etta Grandchild, PT, DPT Physical Therapist - [facility]  [phone] ([place])    Katti Pelle C 07/27/2020, 4:14 PM

## 2020-07-28 DIAGNOSIS — J9601 Acute respiratory failure with hypoxia: Secondary | ICD-10-CM

## 2020-07-28 DIAGNOSIS — J1282 Pneumonia due to coronavirus disease 2019: Secondary | ICD-10-CM

## 2020-07-28 LAB — CBC WITH DIFFERENTIAL/PLATELET
Abs Immature Granulocytes: 0.03 10*3/uL (ref 0.00–0.07)
Basophils Absolute: 0 10*3/uL (ref 0.0–0.1)
Basophils Relative: 1 %
Eosinophils Absolute: 0.1 10*3/uL (ref 0.0–0.5)
Eosinophils Relative: 1 %
HCT: 37 % (ref 36.0–46.0)
Hemoglobin: 12.1 g/dL (ref 12.0–15.0)
Immature Granulocytes: 1 %
Lymphocytes Relative: 32 %
Lymphs Abs: 2.1 10*3/uL (ref 0.7–4.0)
MCH: 30.5 pg (ref 26.0–34.0)
MCHC: 32.7 g/dL (ref 30.0–36.0)
MCV: 93.2 fL (ref 80.0–100.0)
Monocytes Absolute: 1 10*3/uL (ref 0.1–1.0)
Monocytes Relative: 15 %
Neutro Abs: 3.3 10*3/uL (ref 1.7–7.7)
Neutrophils Relative %: 50 %
Platelets: 337 10*3/uL (ref 150–400)
RBC: 3.97 MIL/uL (ref 3.87–5.11)
RDW: 14 % (ref 11.5–15.5)
WBC: 6.6 10*3/uL (ref 4.0–10.5)
nRBC: 0 % (ref 0.0–0.2)

## 2020-07-28 LAB — COMPREHENSIVE METABOLIC PANEL
ALT: 22 U/L (ref 0–44)
AST: 27 U/L (ref 15–41)
Albumin: 2.7 g/dL — ABNORMAL LOW (ref 3.5–5.0)
Alkaline Phosphatase: 87 U/L (ref 38–126)
Anion gap: 8 (ref 5–15)
BUN: 15 mg/dL (ref 8–23)
CO2: 28 mmol/L (ref 22–32)
Calcium: 8.2 mg/dL — ABNORMAL LOW (ref 8.9–10.3)
Chloride: 101 mmol/L (ref 98–111)
Creatinine, Ser: 0.93 mg/dL (ref 0.44–1.00)
GFR calc Af Amer: >60 mL/min (ref >60)
GFR calc non Af Amer: 55 mL/min — ABNORMAL LOW (ref >60)
Glucose, Bld: 95 mg/dL (ref 70–99)
Potassium: 4 mmol/L (ref 3.5–5.1)
Sodium: 137 mmol/L (ref 135–145)
Total Bilirubin: 0.7 mg/dL (ref 0.3–1.2)
Total Protein: 6.1 g/dL — ABNORMAL LOW (ref 6.5–8.1)

## 2020-07-28 LAB — MAGNESIUM: Magnesium: 2.3 mg/dL (ref 1.7–2.4)

## 2020-07-28 LAB — PHOSPHORUS: Phosphorus: 4.5 mg/dL (ref 2.5–4.6)

## 2020-07-28 MED ORDER — ZINC SULFATE 220 (50 ZN) MG PO CAPS: 220.0000 mg | ORAL_CAPSULE | Freq: Every day | ORAL | 0 refills | Status: DC

## 2020-07-28 MED ORDER — TRAZODONE HCL 50 MG PO TABS: 25.0000 mg | ORAL_TABLET | Freq: Every evening | ORAL | 0 refills | Status: DC | PRN

## 2020-07-28 MED ORDER — GUAIFENESIN ER 600 MG PO TB12
600.0000 mg | ORAL_TABLET | Freq: Two times a day (BID) | ORAL | 0 refills | Status: DC
Start: 2020-07-28 — End: 2020-09-13

## 2020-07-28 MED ORDER — ASPIRIN 81 MG PO TBEC
81.0000 mg | DELAYED_RELEASE_TABLET | Freq: Every day | ORAL | 0 refills | Status: DC
Start: 2020-07-28 — End: 2020-12-14

## 2020-07-28 MED ORDER — GUAIFENESIN-DM 100-10 MG/5ML PO SYRP: 10.0000 mL | ORAL_SOLUTION | ORAL | 0 refills | Status: DC | PRN

## 2020-07-28 MED ORDER — ONDANSETRON HCL 4 MG PO TABS: 4.0000 mg | ORAL_TABLET | Freq: Four times a day (QID) | ORAL | 0 refills | Status: DC | PRN

## 2020-07-28 MED ORDER — DRONABINOL 5 MG PO CAPS
5.0000 mg | ORAL_CAPSULE | Freq: Two times a day (BID) | ORAL | 0 refills | Status: DC
Start: 2020-07-28 — End: 2020-08-07

## 2020-07-28 MED ORDER — LORAZEPAM 0.5 MG PO TABS: 0.5000 mg | ORAL_TABLET | Freq: Four times a day (QID) | ORAL | 0 refills | Status: DC | PRN

## 2020-07-28 MED ORDER — FAMOTIDINE 20 MG PO TABS: 20.0000 mg | ORAL_TABLET | Freq: Every day | ORAL | 0 refills | Status: DC

## 2020-07-28 MED ORDER — ASCORBIC ACID 500 MG PO TABS
500.0000 mg | ORAL_TABLET | Freq: Every day | ORAL | 0 refills | Status: DC
Start: 2020-07-28 — End: 2020-12-14

## 2020-07-28 MED ORDER — VITAMIN D3 25 MCG PO TABS: 1000.0000 [IU] | ORAL_TABLET | Freq: Every day | ORAL | 0 refills | Status: DC

## 2020-07-28 MED ORDER — MIRTAZAPINE 15 MG PO TABS
15.0000 mg | ORAL_TABLET | Freq: Every day | ORAL | 0 refills | Status: DC
Start: 2020-07-28 — End: 2020-08-07

## 2020-07-28 MED ORDER — METHOCARBAMOL 500 MG PO TABS: 500.0000 mg | ORAL_TABLET | Freq: Three times a day (TID) | ORAL | 0 refills | Status: DC | PRN

## 2020-07-28 NOTE — Plan of Care (Signed)

## 2020-07-28 NOTE — Discharge Instructions (Signed)
[facility]

## 2020-07-28 NOTE — TOC Progression Note (Signed)
Transition of Care [facility]) - Progression Note    Patient Details  Name: Emily Wu MRN: [id number] Date of Birth: 07/02/1931  Transition of Care [facility]) CM/SW Contact  Jourdyn Ferrin, [place],  Phone Number:[phone] 07/28/2020, 2:14 PM  Clinical Narrative:    Patient to discharge home today with [facility] through [facility].  24/7 care has been arranged and they will be able to start tomorrow. Patient's son in law will transport patient home when ready for discharge. LCSW reached out to [place] with [facility] to confirm patient's discharge and that orders have been received.   [place], LCSW Transitions of Care [phone]    Expected Discharge Plan: [place] Barriers to Discharge: No Barriers Identified  Expected Discharge Plan and Services Expected Discharge Plan: [place] arrangements for the past 2 months: Single Family Home Expected Discharge Date: 07/28/20                                     Social Determinants of Health (SDOH) Interventions    Readmission Risk Interventions No flowsheet data found.

## 2020-07-28 NOTE — Progress Notes (Signed)
Pt's son-in-law (Karen's husband) present at the [place] entrance for pt discharge; the discharge envelope contained the out of facility DNR; the original Advanced Directives previously copied and a copy is in the pt's paper chart; verbally reviewed AVS and advised them of the paper Rxs printed for them to take to her pharmacy; advised them someone from [facility] home health would be contacting them regarding PT; no questions voiced at this time; pt discharged home to her personal 24 hour caregivers

## 2020-07-28 NOTE — Discharge Summary (Signed)
Physician Discharge Summary  Emily Wu GYJ:[id number] DOB: [birth year] DOA: 07/16/2020  PCP: Emily Mc, MD  Admit date: 07/16/2020 Discharge date: 07/31/2020  Time spent: 35 minutes  Recommendations for Outpatient Follow-up:  Covid vaccination;  Acute Covid 19 viral infection -Negative O2 requirement -On room air -Completed Remdesivir 8/27 -Baricitinib/Actemra(off-label use):Currently not indicated -Steroids not given secondary to lack of hypoxia steroids: Currently not indicated given lack of hypoxia -Monoclonal antibody received on admission -Vitamin C and zinc per Covid protocol -9/3; does not meet criteria for home O2  Pulmonary nodule  -Possible 2 mm pulmonary nodule far below pathologic criteria by Fleischner Society guidelines.  If patient survives can follow-up as outpatient  Severe protein calorie malnutrition -Continues to have poor p.o. intake even with encouragement by staff. -9/2 increase Marinol. 5 mg BID -Continue regular diet -Dietary consult -Family does not want any type of feeding tube -Family understands that without significant in improvement in p.o. intake patient will only have a few weeks of meaningful life remaining..  Hypokalemia -Resolved   Depression -Remeron 15 mg daily -9/2 psychiatry to see patient and make recommendations I Vitamin B12 deficiency -Vitamin B12 1000 mcg daily  HLD -Lipitor 20 mg daily  Hypothyroidism -Cytomel 10 mcg daily  RIGHT lower extremity edema -Resolved  Goals of care -9/2 spoke at length with Emily Wu understands that patient will be contagious until~08/05/2020 however wants to bring mother home in order that family may spend time with her during her remaining days.  Understands the safety precautions that will have to be in place to protect the remainder of the family.  States either her husband or niece will pick up mother on Saturday after 1400. -Competent to make medical  decisions? -9/1 palliative care recommended psychiatric evaluation to determine if patient can make her own decisions medically..  Have placed order.  Psychiatry never evaluated patient.  Daughter and family members make decision for patient.  Addendum: On conversation with the daughter she told me that the patient has a living will that has DNR/DNI written as well as no feeding tube written. CODE STATUS changed in the chart to DNR/DNI. Also she would like the patient to go to the rehab. She is anticipating a call from the [facility] team tomorrow. -9/1 per palliative care physician note they would like a psychiatry consult which has been placed.  Will attempt to contact palliative care in the a.m. to clarify conversation between daughter patient and palliative care -9/3 patient will be discharged on 9/4 palliative care aware of plan.   Discharge Diagnoses:  Active Problems:   COVID-19   COVID-19 virus infection   Protein-calorie malnutrition, severe   Pulmonary nodule less than 1 cm in diameter with low risk for malignant neoplasm   Severe protein-calorie malnutrition (HCC)   Hypokalemia   Depression   Vitamin B12 deficiency   Discharge Condition: Guarded  Diet recommendation: Regular  Filed Weights   07/16/20 0825  Weight: 50.3 kg    History of present illness:   86 y.o. WF PMHx hypertension, dyslipidemia and osteoarthritis, who presented to the emergency room with acute onset of generalized body aches and significant weakness with associated recurrent diarrhea without nausea or vomiting.  She admits to mild rhinorrhea and nasal congestion as well as dry cough without dyspnea or wheezing.  When asked about loss of taste and smell she stated that she has not been having much appetite to start with.  No chest pain or palpitations.  She has not been  vaccinated for COVID-19.  Upon presentation to the emergency room, blood pressure was 119/51 with otherwise normal vital signs.  Labs  revealed mild hypokalemia of 3.3 with otherwise unremarkable CMP.  CBC was within normal.  UA was unremarkable.  Two-view chest x-ray showed no acute cardiopulmonary disease.  EKG showed normal sinus rhythm with a rate of 63 with anteroseptal Q waves.  COVID-19 PCR came back positive  The patient was given IV monoclonal antibody infusion earlier in the ER.  She was too weak to ambulate.  She would sit on the bed and will get lightheaded.  She will be admitted to a medically monitored observation isolation bed for further evaluation and management  Hospital Course:  See above  Procedures: 8/23 DG chest;-2 mm nodular density projecting over the right upper lung on the PA radiograph is favored to reflect a vascular summation shadow.   Consultations: Palliative care  Ventilator settings On room air 9/3  Cultures   8/23 SARS coronavirus positive  Antibiotics Anti-infectives (From admission, onward)   Start     Ordered Stop   07/17/20 1000  remdesivir 100 mg in sodium chloride 0.9 % 100 mL IVPB       "Followed by" Linked Group Details   07/16/20 2145 07/20/20 1019   07/17/20 1000  remdesivir 100 mg in sodium chloride 0.9 % 100 mL IVPB  Status:  Discontinued       "Followed by" Linked Group Details   07/16/20 2227 07/16/20 2231   07/16/20 2230  remdesivir 200 mg in sodium chloride 0.9% 250 mL IVPB  Status:  Discontinued       "Followed by" Linked Group Details   07/16/20 2227 07/16/20 2231   07/16/20 2145  remdesivir 200 mg in sodium chloride 0.9% 250 mL IVPB       "Followed by" Linked Group Details   07/16/20 2145 07/17/20 0237       Discharge Exam: Vitals:   07/28/20 0036 07/28/20 0337 07/28/20 0807 07/28/20 1213  BP: (!) 103/51 (!) 113/53 120/63 (!) 111/53  Pulse: 81 73 72 65  Resp: 16 18 16 17   Temp: (!) 97.5 F (36.4 C) 98.2 F (36.8 C) 98.1 F (36.7 C) 98.3 F (36.8 C)  TempSrc: Oral     SpO2: 100% 99% 99% 99%  Weight:      Height:        General: A/O x2 (does  not know when, why) No acute respiratory distress Eyes: negative scleral hemorrhage, negative anisocoria, negative icterus ENT: Negative Runny nose, negative gingival bleeding, Neck:  Negative scars, masses, torticollis, lymphadenopathy, JVD Lungs: Clear to auscultation bilaterally without wheezes or crackles Cardiovascular: Regular rate and rhythm without murmur gallop or rub normal S1 and S2   Discharge Instructions   Allergies as of 07/28/2020      Reactions   Tramadol Anaphylaxis, Other (See Comments)   Lowered Blood Pressure   Amoxicillin Rash   Promethazine Hcl Rash   Sulfa Drugs Cross Reactors Rash      Medication List    STOP taking these medications   potassium chloride SA 20 MEQ tablet Commonly known as: KLOR-CON     TAKE these medications   acetaminophen 650 MG CR tablet Commonly known as: TYLENOL Take 650 mg by mouth every 8 (eight) hours as needed for pain.   amitriptyline 25 MG tablet Commonly known as: ELAVIL TAKE ONE TABLET AT BEDTIME NEED APP FOR NEXT REFILL What changed: See the new instructions.   ascorbic acid 500  MG tablet Commonly known as: VITAMIN C Take 1 tablet (500 mg total) by mouth daily.   aspirin 81 MG EC tablet Take 1 tablet (81 mg total) by mouth daily. Swallow whole.   atorvastatin 20 MG tablet Commonly known as: LIPITOR TAKE ONE TABLET EVERY DAY   CENTRUM SILVER PO Take 1 tablet by mouth daily.   diclofenac sodium 1 % Gel Commonly known as: VOLTAREN Apply 2 g topically 4 (four) times daily as needed (for knee pain).   Dotti 0.1 MG/24HR patch Generic drug: estradiol CHANGE PATCH TWICE WEEKLY What changed: See the new instructions.   dronabinol 5 MG capsule Commonly known as: MARINOL Take 1 capsule (5 mg total) by mouth 2 (two) times daily before lunch and supper.   famotidine 20 MG tablet Commonly known as: PEPCID Take 1 tablet (20 mg total) by mouth daily.   guaiFENesin 600 MG 12 hr tablet Commonly known as:  MUCINEX Take 1 tablet (600 mg total) by mouth 2 (two) times daily.   guaiFENesin-dextromethorphan 100-10 MG/5ML syrup Commonly known as: ROBITUSSIN DM Take 10 mLs by mouth every 4 (four) hours as needed for cough.   liothyronine 5 MCG tablet Commonly known as: CYTOMEL TAKE 2 TABLETS BY MOUTH DAILY   LORazepam 0.5 MG tablet Commonly known as: ATIVAN Take 1 tablet (0.5 mg total) by mouth every 6 (six) hours as needed for anxiety.   methocarbamol 500 MG tablet Commonly known as: ROBAXIN Take 1 tablet (500 mg total) by mouth every 8 (eight) hours as needed for muscle spasms.   mirtazapine 15 MG tablet Commonly known as: REMERON Take 1 tablet (15 mg total) by mouth at bedtime.   ondansetron 4 MG tablet Commonly known as: ZOFRAN Take 1 tablet (4 mg total) by mouth every 6 (six) hours as needed for nausea.   polyethylene glycol 17 g packet Commonly known as: MIRALAX / GLYCOLAX Take 17 g by mouth daily as needed for moderate constipation.   protein supplement Powd Take 1 scoop by mouth daily as needed (constipation).   sennosides-docusate sodium 8.6-50 MG tablet Commonly known as: SENOKOT-S Take 2 tablets by mouth daily.   traZODone 50 MG tablet Commonly known as: DESYREL Take 0.5 tablets (25 mg total) by mouth at bedtime as needed for sleep.   vitamin B-12 1000 MCG tablet Commonly known as: CYANOCOBALAMIN Take 1,000 mcg by mouth daily.   Vitamin D3 25 MCG tablet Commonly known as: Vitamin D Take 1 tablet (1,000 Units total) by mouth daily.   zinc sulfate 220 (50 Zn) MG capsule Take 1 capsule (220 mg total) by mouth daily.      Allergies  Allergen Reactions  . Tramadol Anaphylaxis and Other (See Comments)    Lowered Blood Pressure  . Amoxicillin Rash  . Promethazine Hcl Rash  . Sulfa Drugs Cross Reactors Rash    Follow-up Information    [facility].   Contact information: [facility],  [place] [facility].   Contact information: [phone]        Emily Mc, MD. Schedule an appointment as soon as possible for a visit in 1 week(s).   Specialty: Internal Medicine Contact information: [place] [address] [phone]        Richmond Campbell, MD. Schedule an appointment as soon as possible for a visit in 2 month(s).   Specialty: Gastroenterology Contact information: [address]  [place] [address] [phone]                The results of significant diagnostics from this hospitalization (including imaging, microbiology, ancillary and laboratory) are listed below for reference.    Significant Diagnostic Studies: DG Chest 2 View  Result Date: 07/16/2020 CLINICAL DATA:  Weakness. COVID-19 exposure. Evaluation for pneumonia. EXAM: CHEST - 2 VIEW COMPARISON:  06/18/2020 FINDINGS: The cardiomediastinal silhouette is within normal limits. The lungs are well inflated. No confluent airspace opacity, edema, pleural effusion, pneumothorax is identified. A 2 mm nodular density projecting over the right upper lung on the PA radiograph is favored to reflect a vascular summation shadow. Bilateral shoulder degenerative changes and postoperative changes to the distal right clavicle are noted. IMPRESSION: No evidence of active cardiopulmonary disease. Electronically Signed   By: Logan Bores M.D.   On: 07/16/2020 14:22   US Venous Img Lower Unilateral Right (DVT)  Result Date: 07/22/2020 CLINICAL DATA:  86 year old with right lower extremity edema. EXAM: RIGHT LOWER EXTREMITY VENOUS DOPPLER ULTRASOUND TECHNIQUE: Gray-scale sonography with graded compression, as well as color Doppler and duplex ultrasound were performed to evaluate the lower extremity deep venous systems from the level of the common femoral vein and including the common femoral, femoral, profunda femoral, popliteal and calf veins including the  posterior tibial, peroneal and gastrocnemius veins when visible. The superficial great saphenous vein was also interrogated. Spectral Doppler was utilized to evaluate flow at rest and with distal augmentation maneuvers in the common femoral, femoral and popliteal veins. COMPARISON:  None. FINDINGS: Contralateral Common Femoral Vein: Respiratory phasicity is normal and symmetric with the symptomatic side. No evidence of thrombus. Normal compressibility. Common Femoral Vein: No evidence of thrombus. Normal compressibility, respiratory phasicity and response to augmentation. Saphenofemoral Junction: No evidence of thrombus. Normal compressibility and flow on color Doppler imaging. Profunda Femoral Vein: No evidence of thrombus. Normal compressibility and flow on color Doppler imaging. Femoral Vein: No evidence of thrombus. Normal compressibility, respiratory phasicity and response to augmentation. Popliteal Vein: No evidence of thrombus. Normal compressibility, respiratory phasicity and response to augmentation. Calf Veins: No evidence of thrombus. Normal compressibility and flow on color Doppler imaging. Other Findings:  None. IMPRESSION: Negative for deep venous thrombosis in right lower extremity. Electronically Signed   By: Markus Daft M.D.   On: 07/22/2020 16:03   DG Abd Portable 1V  Result Date: 07/21/2020 CLINICAL DATA:  Constipation. EXAM: PORTABLE ABDOMEN - 1 VIEW COMPARISON:  July 19, 2020 FINDINGS: Nonobstructive bowel gas pattern. No evidence of constipation. No other acute abnormalities. IMPRESSION: Negative. Electronically Signed   By: Dorise Bullion III M.D   On: 07/21/2020 16:32   DG Abd Portable 1V  Result Date: 07/19/2020 CLINICAL DATA:  Body aches, weakness, diarrhea, COVID-19; past history hypertension, hyperlipidemia EXAM: PORTABLE ABDOMEN - 1 VIEW COMPARISON:  Portable exam 0809 hours compared to 03/28/2009 FINDINGS: Surgical clips RIGHT upper quadrant likely reflect cholecystectomy.  Nonobstructive bowel gas pattern. Scattered gas in nondistended large and small bowel loops. No bowel dilatation or bowel wall thickening. Osseous structures unremarkable. IMPRESSION: Nonspecific bowel gas pattern Electronically Signed   By: Lavonia Dana M.D.   On: 07/19/2020 09:39    Microbiology: No results found for this or any previous visit (from the past 240 hour(s)).   Labs: Basic Metabolic Panel: Recent Labs  Lab 07/25/20 0510 07/26/20 0444 07/27/20 0713 07/28/20 0801  NA 138 140 140 137  K 3.6 4.1 4.2 4.0  CL 104 106 104  101  CO2 26 28 28 28   GLUCOSE 112* 103* 99 95  BUN 15 13 13 15   CREATININE 0.92 0.74 1.02* 0.93  CALCIUM 8.5* 8.3* 8.4* 8.2*  MG  --  2.1 2.2 2.3  PHOS  --  2.8 3.4 4.5   Liver Function Tests: Recent Labs  Lab 07/26/20 0444 07/27/20 0713 07/28/20 0801  AST 25 31 27   ALT 18 20 22   ALKPHOS 78 81 87  BILITOT 0.6 0.7 0.7  PROT 5.9* 6.0* 6.1*  ALBUMIN 2.7* 2.7* 2.7*   No results for input(s): LIPASE, AMYLASE in the last 168 hours. No results for input(s): AMMONIA in the last 168 hours. CBC: Recent Labs  Lab 07/26/20 0444 07/27/20 0713 07/28/20 0801  WBC 7.5 7.3 6.6  NEUTROABS 4.5 4.2 3.3  HGB 12.0 12.1 12.1  HCT 36.6 36.1 37.0  MCV 92.9 91.6 93.2  PLT 335 350 337   Cardiac Enzymes: No results for input(s): CKTOTAL, CKMB, CKMBINDEX, TROPONINI in the last 168 hours. BNP: BNP (last 3 results) No results for input(s): BNP in the last 8760 hours.  ProBNP (last 3 results) No results for input(s): PROBNP in the last 8760 hours.  CBG: No results for input(s): GLUCAP in the last 168 hours.     Signed:  Dia Crawford, MD [facility] [phone] pager

## 2020-07-28 NOTE — Progress Notes (Signed)
MD order received in [facility] to discharge pt home with home health PT today; TOC previously established Home Health PT with [facility]; pt verbalized that she wanted the discharge instructions to be discussed with her family upon their arrival; will defer instructions until arrival of family in order to review at the [facility] entrance

## 2020-07-31 ENCOUNTER — Ambulatory Visit: Payer: Medicare Other | Admitting: Physical Therapy

## 2020-07-31 ENCOUNTER — Telehealth: Payer: Self-pay | Admitting: Internal Medicine

## 2020-07-31 NOTE — Telephone Encounter (Signed)
Patient 's daughter called for hospital f/u for mother scheduled on 08-08-20 @11 :00

## 2020-07-31 NOTE — Telephone Encounter (Signed)
  Called pt to complete a TCM call due to her recent d/c from [facility] on 07/28/20. Pt declined completing the call and said she did not feel up to it and that she would f/u at upcoming apt. Advised pt to please CB if she had any questions or concerns prior to her next apt. Verified HFU for 08/08/20 @ 11:00 AM.

## 2020-08-01 ENCOUNTER — Ambulatory Visit: Admit: 2020-08-01 | Payer: Medicare Other | Admitting: Ophthalmology

## 2020-08-01 ENCOUNTER — Telehealth: Payer: Self-pay | Admitting: Internal Medicine

## 2020-08-01 SURGERY — PHACOEMULSIFICATION, CATARACT, WITH IOL INSERTION
Anesthesia: Topical | Laterality: Right

## 2020-08-01 NOTE — Telephone Encounter (Signed)
Referral form for palliative care has been placed in red folder.

## 2020-08-01 NOTE — Telephone Encounter (Signed)
Emily Wu with Hospice called and wanted to know if we received orders for hospice care. Her contact number is [phone] Option #2

## 2020-08-03 DIAGNOSIS — R911 Solitary pulmonary nodule: Secondary | ICD-10-CM | POA: Diagnosis not present

## 2020-08-03 DIAGNOSIS — U071 COVID-19: Secondary | ICD-10-CM | POA: Diagnosis not present

## 2020-08-03 DIAGNOSIS — K219 Gastro-esophageal reflux disease without esophagitis: Secondary | ICD-10-CM | POA: Diagnosis not present

## 2020-08-03 DIAGNOSIS — R32 Unspecified urinary incontinence: Secondary | ICD-10-CM | POA: Diagnosis not present

## 2020-08-03 DIAGNOSIS — I951 Orthostatic hypotension: Secondary | ICD-10-CM | POA: Diagnosis not present

## 2020-08-03 DIAGNOSIS — E43 Unspecified severe protein-calorie malnutrition: Secondary | ICD-10-CM | POA: Diagnosis not present

## 2020-08-03 DIAGNOSIS — F329 Major depressive disorder, single episode, unspecified: Secondary | ICD-10-CM | POA: Diagnosis not present

## 2020-08-03 DIAGNOSIS — M545 Low back pain: Secondary | ICD-10-CM | POA: Diagnosis not present

## 2020-08-03 DIAGNOSIS — M1711 Unilateral primary osteoarthritis, right knee: Secondary | ICD-10-CM | POA: Diagnosis not present

## 2020-08-03 DIAGNOSIS — E039 Hypothyroidism, unspecified: Secondary | ICD-10-CM | POA: Diagnosis not present

## 2020-08-03 DIAGNOSIS — K635 Polyp of colon: Secondary | ICD-10-CM | POA: Diagnosis not present

## 2020-08-03 DIAGNOSIS — Z9181 History of falling: Secondary | ICD-10-CM | POA: Diagnosis not present

## 2020-08-03 DIAGNOSIS — E785 Hyperlipidemia, unspecified: Secondary | ICD-10-CM | POA: Diagnosis not present

## 2020-08-03 DIAGNOSIS — F419 Anxiety disorder, unspecified: Secondary | ICD-10-CM | POA: Diagnosis not present

## 2020-08-03 DIAGNOSIS — I129 Hypertensive chronic kidney disease with stage 1 through stage 4 chronic kidney disease, or unspecified chronic kidney disease: Secondary | ICD-10-CM | POA: Diagnosis not present

## 2020-08-03 DIAGNOSIS — E538 Deficiency of other specified B group vitamins: Secondary | ICD-10-CM | POA: Diagnosis not present

## 2020-08-03 DIAGNOSIS — Z87891 Personal history of nicotine dependence: Secondary | ICD-10-CM | POA: Diagnosis not present

## 2020-08-03 DIAGNOSIS — N183 Chronic kidney disease, stage 3 unspecified: Secondary | ICD-10-CM | POA: Diagnosis not present

## 2020-08-03 DIAGNOSIS — I447 Left bundle-branch block, unspecified: Secondary | ICD-10-CM | POA: Diagnosis not present

## 2020-08-06 ENCOUNTER — Telehealth: Payer: Self-pay | Admitting: Internal Medicine

## 2020-08-06 NOTE — Telephone Encounter (Signed)
Spoke with pt's daughter to let her know that Dr. Derrel Nip is willing to see pt in the office tomorrow at 12 pm for an hour visit. Daughter stated that pt has not had a fever in a while and does not have any other remaining covid symptoms. She stated that she has been assured by two different doctors at the hospital that whatever is going on with pt is not covid but could have about due to having covid. Daughter stated that she would have to talk to her boss when they got back from to see if you could get tomorrow off to bring pt to appt.

## 2020-08-06 NOTE — Telephone Encounter (Signed)
Spoke with patient daughter she is very addiment about patient being seen on the office tried to explain there is nothing we cannot do virtually that we would do different in the office , patient daughter says her mother is weak and not eating and can hardly walk that it has been sine 07/16/20 from patient DX of COVID and she would like her seen in the office, she stated she will agree to her being seen virtually but she voiced that she is not pleased with this decision and feels her mother needs to be seen in the office and will hold office responsible if anything happens. Advised her mother could actually catch something again in the office we are trying to protect her as well as other patients. Patient daughter was still very upset.

## 2020-08-06 NOTE — Telephone Encounter (Signed)
Patient's daughter called in stated that she wanted her mother sent in person because she is not eating or able to walk and stated that she does not have any covid symptoms.Darrel Reach  Daughter number is [phone]

## 2020-08-06 NOTE — Telephone Encounter (Signed)
If patient is not having fevers,  I will come in early tomorrow at 12:00 to see her and allow for an hour visit

## 2020-08-06 NOTE — Telephone Encounter (Signed)
Daughter called back and stated that she is able to get off work tomorrow so she will be bringing her mother to appt at 12pm.

## 2020-08-07 ENCOUNTER — Other Ambulatory Visit: Payer: Self-pay

## 2020-08-07 ENCOUNTER — Encounter: Payer: Self-pay | Admitting: Internal Medicine

## 2020-08-07 ENCOUNTER — Telehealth: Payer: Self-pay

## 2020-08-07 ENCOUNTER — Ambulatory Visit (INDEPENDENT_AMBULATORY_CARE_PROVIDER_SITE_OTHER): Payer: Medicare Other | Admitting: Internal Medicine

## 2020-08-07 ENCOUNTER — Other Ambulatory Visit
Admission: RE | Admit: 2020-08-07 | Discharge: 2020-08-07 | Disposition: A | Discharge status: Home / Self Care | Payer: Medicare Other | Source: Ambulatory Visit | Attending: Internal Medicine | Admitting: Internal Medicine

## 2020-08-07 ENCOUNTER — Encounter: Payer: Medicare Other | Admitting: Physical Therapy

## 2020-08-07 VITALS — BP 108/54 | Resp 15

## 2020-08-07 DIAGNOSIS — E43 Unspecified severe protein-calorie malnutrition: Secondary | ICD-10-CM

## 2020-08-07 DIAGNOSIS — M1712 Unilateral primary osteoarthritis, left knee: Secondary | ICD-10-CM | POA: Diagnosis not present

## 2020-08-07 DIAGNOSIS — N3000 Acute cystitis without hematuria: Secondary | ICD-10-CM

## 2020-08-07 DIAGNOSIS — Z79899 Other long term (current) drug therapy: Secondary | ICD-10-CM | POA: Diagnosis not present

## 2020-08-07 DIAGNOSIS — Z7982 Long term (current) use of aspirin: Secondary | ICD-10-CM | POA: Diagnosis not present

## 2020-08-07 DIAGNOSIS — F320 Major depressive disorder, single episode, mild: Secondary | ICD-10-CM | POA: Insufficient documentation

## 2020-08-07 DIAGNOSIS — R410 Disorientation, unspecified: Secondary | ICD-10-CM | POA: Diagnosis not present

## 2020-08-07 DIAGNOSIS — U071 COVID-19: Secondary | ICD-10-CM

## 2020-08-07 LAB — URINALYSIS, ROUTINE W REFLEX MICROSCOPIC
Bilirubin Urine: NEGATIVE
Glucose, UA: NEGATIVE mg/dL
Hgb urine dipstick: NEGATIVE
Ketones, ur: NEGATIVE mg/dL
Nitrite: NEGATIVE
Protein, ur: 100 mg/dL — AB
Specific Gravity, Urine: 1.019 (ref 1.005–1.030)
WBC, UA: >50 WBC/hpf — ABNORMAL HIGH (ref 0–5)
pH: 5 (ref 5.0–8.0)

## 2020-08-07 MED ORDER — LORAZEPAM 0.5 MG PO TABS: 0.2500 mg | ORAL_TABLET | Freq: Four times a day (QID) | ORAL | 0 refills | Status: DC | PRN

## 2020-08-07 MED ORDER — MIRTAZAPINE 15 MG PO TABS
30.0000 mg | ORAL_TABLET | Freq: Every day | ORAL | 0 refills | Status: DC
Start: 2020-08-07 — End: 2020-08-07

## 2020-08-07 MED ORDER — MIRTAZAPINE 15 MG PO TABS
30.0000 mg | ORAL_TABLET | Freq: Every day | ORAL | 0 refills | Status: DC
Start: 2020-08-07 — End: 2020-08-17

## 2020-08-07 MED ORDER — LACTULOSE 20 GM/30ML PO SOLN: ORAL | 3 refills | Status: DC

## 2020-08-07 NOTE — Assessment & Plan Note (Addendum)
She has had a weight loss of at least 11 lbs since July 26 .  Encouraged daughter  to liberalize diet to encourage intake  since patient has many food intolerances

## 2020-08-07 NOTE — Patient Instructions (Addendum)
Stop the marinol  Stop the amitryptiline  Continue the trazodone for insomnia   Increase the mirtazipine to 30 mg starting Sept 19 for depression and poor appetite    Reduce the lorazepam to 1/2 tablet every 6 hours   Try making a milkshake out of ice cream and Premier Protein  LET HER EAT AND DRINK ANYTHING SHE WANTS!  (remember : the enemy of "good" is "perfect")  Lactulose 30 ccs every 4 hours (2 doses max) every 3 days if needed to produce a stool   Collect a urine specimen and return it to [facility] for testing,   or refrigerate until Home health RN can  Take it for analysis

## 2020-08-07 NOTE — Assessment & Plan Note (Signed)
mirtazipine started during [facility] hospitalization at 15 mg daily.  Increase to 30 mg on sept 19

## 2020-08-07 NOTE — Assessment & Plan Note (Signed)
Likely a combination of pharmacotherapy,  Recent COVID 19 infection,  And possible UTI.  Dc elavil,  Marinol.  Reduce lorazepam to 0.25 mg q 6 hrs prn.  Will increase mirtazipine on sept 19 to 30 mg if needed.  Liberalize diet to improve intake.  Lactulose for constipation every 3 days.

## 2020-08-07 NOTE — Assessment & Plan Note (Signed)
S/p MoAb and Remdesivir.  If she survives,  Will need vaccination in 90 days

## 2020-08-07 NOTE — Progress Notes (Signed)
Subjective:  Patient ID: Emily Wu, female    DOB: 1931-06-11  Age: 86 y.o. MRN: [id number]  CC: The primary encounter diagnosis was Delirium. Diagnoses of Primary localized osteoarthritis of left knee, Severe protein-calorie malnutrition ([place]), COVID-19 virus infection, and Current mild episode of major depressive disorder, unspecified whether recurrent ([place]) were also pertinent to this visit.  HPI Emily Wu presents for hospital follow up.  She is accompanid by her daughter Juliann Pulse  This visit occurred during the SARS-CoV-2 public health emergency.  Safety protocols were in place, including screening questions prior to the visit, additional usage of staff PPE, and extensive cleaning of exam room while observing appropriate contact time as indicated for disinfecting solutions.   Patient was admitted to [facility] with COVID 19 INFECTION ON AUGUST 23 .  SHE WAS severely dehydrated, malnourished and hypokalemic at admission.  She received MoAb and Remdesivir x 5 days. Her appetite failed to improved and she was deemed appropriate for [facility].  placement in SNF was offered but declined by family who preferred to bring her home with 24 hour care.  Sh was discharged on SEpt 7.  Family  Has had a difficult time managing patient due to her refusal to eat and drink despite "wanting to live."  She has been agitated and intermittently lethargic  Medications reviewed:  She was started on marinol. Lorazepam and mirtazipine.  Also taking trazodone and amitriptyline.  Has not had a BM in 8 days, stared taking sennakot 2 nights ago.  Marland Kitchen  Urinary output hard to assess due to incontinence.  Not ambulating .    Family's paid caregiver was not up  To the task so another one has bene hired to replace.  PT is coming tomorrow and home health as well.   Outpatient Medications Prior to Visit  Medication Sig Dispense Refill  . acetaminophen (TYLENOL) 650 MG CR tablet Take 650 mg by mouth every 8 (eight) hours as  needed for pain.     Marland Kitchen ascorbic acid (VITAMIN C) 500 MG tablet Take 1 tablet (500 mg total) by mouth daily. 30 tablet 0  . aspirin EC 81 MG EC tablet Take 1 tablet (81 mg total) by mouth daily. Swallow whole. 30 tablet 0  . atorvastatin (LIPITOR) 20 MG tablet TAKE ONE TABLET EVERY DAY (Patient taking differently: Take 20 mg by mouth daily. ) 90 tablet 1  . cholecalciferol (VITAMIN D) 25 MCG tablet Take 1 tablet (1,000 Units total) by mouth daily. 30 tablet 0  . diclofenac sodium (VOLTAREN) 1 % GEL Apply 2 g topically 4 (four) times daily as needed (for knee pain). 200 g 11  . DOTTI 0.1 MG/24HR patch CHANGE PATCH TWICE WEEKLY (Patient taking differently: Place 1 patch onto the skin 2 (two) times a week. ) 8 patch 0  . famotidine (PEPCID) 20 MG tablet Take 1 tablet (20 mg total) by mouth daily. 30 tablet 0  . guaiFENesin (MUCINEX) 600 MG 12 hr tablet Take 1 tablet (600 mg total) by mouth 2 (two) times daily. 30 tablet 0  . guaiFENesin-dextromethorphan (ROBITUSSIN DM) 100-10 MG/5ML syrup Take 10 mLs by mouth every 4 (four) hours as needed for cough. 118 mL 0  . liothyronine (CYTOMEL) 5 MCG tablet TAKE 2 TABLETS BY MOUTH DAILY (Patient taking differently: Take 10 mcg by mouth daily. ) 180 tablet 0  . methocarbamol (ROBAXIN) 500 MG tablet Take 1 tablet (500 mg total) by mouth every 8 (eight) hours as needed for muscle spasms.  30 tablet 0  . Multiple Vitamins-Minerals (CENTRUM SILVER PO) Take 1 tablet by mouth daily.    . ondansetron (ZOFRAN) 4 MG tablet Take 1 tablet (4 mg total) by mouth every 6 (six) hours as needed for nausea. 20 tablet 0  . polyethylene glycol (MIRALAX / GLYCOLAX) packet Take 17 g by mouth daily as needed for moderate constipation.     . protein supplement (RESOURCE BENEPROTEIN) POWD Take 1 scoop by mouth daily as needed (constipation).     . sennosides-docusate sodium (SENOKOT-S) 8.6-50 MG tablet Take 2 tablets by mouth daily. 30 tablet 1  . traZODone (DESYREL) 50 MG tablet Take 0.5  tablets (25 mg total) by mouth at bedtime as needed for sleep. 20 tablet 0  . vitamin B-12 (CYANOCOBALAMIN) 1000 MCG tablet Take 1,000 mcg by mouth daily.     Marland Kitchen zinc sulfate 220 (50 Zn) MG capsule Take 1 capsule (220 mg total) by mouth daily. 30 capsule 0  . amitriptyline (ELAVIL) 25 MG tablet TAKE ONE TABLET AT BEDTIME NEED APP FOR NEXT REFILL (Patient taking differently: Take 25 mg by mouth at bedtime. ) 90 tablet 0  . dronabinol (MARINOL) 5 MG capsule Take 1 capsule (5 mg total) by mouth 2 (two) times daily before lunch and supper. 60 capsule 0  . LORazepam (ATIVAN) 0.5 MG tablet Take 1 tablet (0.5 mg total) by mouth every 6 (six) hours as needed for anxiety. 20 tablet 0  . mirtazapine (REMERON) 15 MG tablet Take 1 tablet (15 mg total) by mouth at bedtime. 30 tablet 0   No facility-administered medications prior to visit.    Review of Systems;  Patient denies headache, fevers,, skin rash, eye pain, sinus congestion and sinus pain, sore throat, dysphagia,  hemoptysis , cough, dyspnea, wheezing, chest pain, palpitations, orthopnea, edema, abdominal pain, nausea, melena, diarrhea, constipation, flank pain, dysuria, hematuria, urinary  Frequency, nocturia, numbness, tingling, seizures,   Loss of consciousness,  Tremor,  and suicidal ideation.      Objective:  BP (!) 108/54 (BP Location: Right Arm, Patient Position: Sitting, Cuff Size: Normal)   Resp 15   SpO2 99%   BP Readings from Last 3 Encounters:  08/07/20 (!) 108/54  07/28/20 (!) 111/53  06/18/20 (!) 118/64    Wt Readings from Last 3 Encounters:  07/16/20 111 lb (50.3 kg)  06/18/20 122 lb 3.2 oz (55.4 kg)  06/05/20 123 lb (55.8 kg)    General appearance: alert, cooperative and appears stated age Ears: normal TM's and external ear canals both ears Throat: lips, mucosa, and tongue normal; teeth and gums normal Neck: no adenopathy, no carotid bruit, supple, symmetrical, trachea midline and thyroid not enlarged, symmetric, no  tenderness/mass/nodules Back: symmetric, no curvature. ROM normal. No CVA tenderness. Lungs: clear to auscultation bilaterally Heart: regular rate and rhythm, S1, S2 normal, no murmur, click, rub or gallop Abdomen: soft, non-tender; bowel sounds normal; no masses,  no organomegaly Pulses: 2+ and symmetric Skin: Skin color, texture, turgor normal. No rashes or lesions Lymph nodes: Cervical, supraclavicular, and axillary nodes normal.  Lab Results  Component Value Date   HGBA1C 5.8 08/18/2019    Lab Results  Component Value Date   CREATININE 0.93 07/28/2020   CREATININE 1.02 (H) 07/27/2020   CREATININE 0.74 07/26/2020    Lab Results  Component Value Date   WBC 6.6 07/28/2020   HGB 12.1 07/28/2020   HCT 37.0 07/28/2020   PLT 337 07/28/2020   GLUCOSE 95 07/28/2020   CHOL 161 08/18/2019  TRIG 206.0 (H) 08/18/2019   HDL 41.00 08/18/2019   LDLDIRECT 90.0 08/18/2019   LDLCALC 73 03/19/2018   ALT 22 07/28/2020   AST 27 07/28/2020   NA 137 07/28/2020   K 4.0 07/28/2020   CL 101 07/28/2020   CREATININE 0.93 07/28/2020   BUN 15 07/28/2020   CO2 28 07/28/2020   TSH 1.95 03/09/2020   INR 1.04 12/08/2018   HGBA1C 5.8 08/18/2019    DG Chest 2 View  Result Date: 07/16/2020 CLINICAL DATA:  Weakness. COVID-19 exposure. Evaluation for pneumonia. EXAM: CHEST - 2 VIEW COMPARISON:  06/18/2020 FINDINGS: The cardiomediastinal silhouette is within normal limits. The lungs are well inflated. No confluent airspace opacity, edema, pleural effusion, pneumothorax is identified. A 2 mm nodular density projecting over the right upper lung on the PA radiograph is favored to reflect a vascular summation shadow. Bilateral shoulder degenerative changes and postoperative changes to the distal right clavicle are noted. IMPRESSION: No evidence of active cardiopulmonary disease. Electronically Signed   By: Logan Bores M.D.   On: 07/16/2020 14:22    Assessment & Plan:   Problem List Items Addressed This  Visit      Unprioritized   Primary localized osteoarthritis of left knee    Advised to schedule tylenol 1000 mg q 8 hrs and use diclofenac gel qid       COVID-19 virus infection    S/p MoAb and Remdesivir.  If she survives,  Will need vaccination in 90 days       Severe protein-calorie malnutrition ([place])    She has had a weight loss of at least 11 lbs since July 26 .  Encouraged daughter  to liberalize diet to encourage intake  since patient has many food intolerances       Depression    mirtazipine started during [facility] hospitalization at 15 mg daily.  Increase to 30 mg on sept 19      Relevant Medications   LORazepam (ATIVAN) 0.5 MG tablet   mirtazapine (REMERON) 15 MG tablet   Delirium - Primary    Likely a combination of pharmacotherapy,  Recent COVID 19 infection,  And possible UTI.  Dc elavil,  Marinol.  Reduce lorazepam to 0.25 mg q 6 hrs prn.  Will increase mirtazipine on sept 19 to 30 mg if needed.  Liberalize diet to improve intake.  Lactulose for constipation every 3 days.        Relevant Orders   Urinalysis, Routine w reflex microscopic   Urine Culture     A total of 40 minutes was spent with patient more than half of which was spent in counseling patient on the above mentioned issues , reviewing and explaining recent labs and imaging studies done, and coordination of care.   I have discontinued Jerene Pitch. Charette's amitriptyline and dronabinol. I have also changed her LORazepam. Additionally, I am having her start on Lactulose. Lastly, I am having her maintain her acetaminophen, vitamin B-12, diclofenac sodium, Multiple Vitamins-Minerals (CENTRUM SILVER PO), polyethylene glycol, protein supplement, sennosides-docusate sodium, atorvastatin, Dotti, liothyronine, aspirin, traZODone, famotidine, ondansetron, methocarbamol, ascorbic acid, Vitamin D3, zinc sulfate, guaiFENesin, guaiFENesin-dextromethorphan, and mirtazapine.  Meds ordered this encounter  Medications  .  LORazepam (ATIVAN) 0.5 MG tablet    Sig: Take 0.5 tablets (0.25 mg total) by mouth every 6 (six) hours as needed for anxiety.    Dispense:  60 tablet    Refill:  0  . DISCONTD: mirtazapine (REMERON) 15 MG tablet    Sig: Take  2 tablets (30 mg total) by mouth at bedtime.    Dispense:  60 tablet    Refill:  0  . Lactulose 20 GM/30ML SOLN    Sig: 30 ml every 4 hours until constipation is relieved    Dispense:  236 mL    Refill:  3  . mirtazapine (REMERON) 15 MG tablet    Sig: Take 2 tablets (30 mg total) by mouth at bedtime.    Dispense:  60 tablet    Refill:  0    Medications Discontinued During This Encounter  Medication Reason  . dronabinol (MARINOL) 5 MG capsule   . LORazepam (ATIVAN) 0.5 MG tablet   . mirtazapine (REMERON) 15 MG tablet   . amitriptyline (ELAVIL) 25 MG tablet   . mirtazapine (REMERON) 15 MG tablet     Follow-up: Return in about 1 week (around 08/14/2020).   Crecencio Mc, MD

## 2020-08-07 NOTE — Telephone Encounter (Signed)
Palliative care initial in home visit scheduled for Wed 08-15-2020 @1030am .

## 2020-08-07 NOTE — Assessment & Plan Note (Signed)
Advised to schedule tylenol 1000 mg q 8 hrs and use diclofenac gel qid

## 2020-08-08 ENCOUNTER — Telehealth: Payer: Medicare Other | Admitting: Internal Medicine

## 2020-08-08 DIAGNOSIS — R911 Solitary pulmonary nodule: Secondary | ICD-10-CM | POA: Diagnosis not present

## 2020-08-08 DIAGNOSIS — N183 Chronic kidney disease, stage 3 unspecified: Secondary | ICD-10-CM | POA: Diagnosis not present

## 2020-08-08 DIAGNOSIS — F329 Major depressive disorder, single episode, unspecified: Secondary | ICD-10-CM | POA: Diagnosis not present

## 2020-08-08 DIAGNOSIS — E43 Unspecified severe protein-calorie malnutrition: Secondary | ICD-10-CM | POA: Diagnosis not present

## 2020-08-08 DIAGNOSIS — U071 COVID-19: Secondary | ICD-10-CM | POA: Diagnosis not present

## 2020-08-08 DIAGNOSIS — I129 Hypertensive chronic kidney disease with stage 1 through stage 4 chronic kidney disease, or unspecified chronic kidney disease: Secondary | ICD-10-CM | POA: Diagnosis not present

## 2020-08-09 ENCOUNTER — Encounter: Payer: 59 | Admitting: Physical Therapy

## 2020-08-09 ENCOUNTER — Other Ambulatory Visit: Payer: Self-pay | Admitting: Internal Medicine

## 2020-08-09 DIAGNOSIS — N390 Urinary tract infection, site not specified: Secondary | ICD-10-CM

## 2020-08-09 HISTORY — DX: Urinary tract infection, site not specified: N39.0

## 2020-08-09 LAB — URINE CULTURE: Culture: >=100000 — AB

## 2020-08-09 MED ORDER — CIPROFLOXACIN HCL 250 MG PO TABS: 250.0000 mg | ORAL_TABLET | Freq: Two times a day (BID) | ORAL | 0 refills | Status: DC

## 2020-08-09 NOTE — Assessment & Plan Note (Signed)
E coli pan sensitiive.  Patient has allergy to PCN and Sulfa.   Cipro 250 mg bid x 5 days

## 2020-08-10 ENCOUNTER — Telehealth: Payer: Self-pay

## 2020-08-10 DIAGNOSIS — U071 COVID-19: Secondary | ICD-10-CM | POA: Diagnosis not present

## 2020-08-10 DIAGNOSIS — I129 Hypertensive chronic kidney disease with stage 1 through stage 4 chronic kidney disease, or unspecified chronic kidney disease: Secondary | ICD-10-CM | POA: Diagnosis not present

## 2020-08-10 DIAGNOSIS — F329 Major depressive disorder, single episode, unspecified: Secondary | ICD-10-CM | POA: Diagnosis not present

## 2020-08-10 DIAGNOSIS — R911 Solitary pulmonary nodule: Secondary | ICD-10-CM | POA: Diagnosis not present

## 2020-08-10 DIAGNOSIS — E43 Unspecified severe protein-calorie malnutrition: Secondary | ICD-10-CM | POA: Diagnosis not present

## 2020-08-10 DIAGNOSIS — N183 Chronic kidney disease, stage 3 unspecified: Secondary | ICD-10-CM | POA: Diagnosis not present

## 2020-08-10 NOTE — Telephone Encounter (Signed)
Patient advised as below. Patient verbalizes understanding and is in agreement with treatment plan.  

## 2020-08-10 NOTE — Telephone Encounter (Signed)
-----   Message from Crecencio Mc, MD sent at 08/09/2020  7:41 PM EDT -----  HER  labs confirm that she has  a UTI, .  I am calling  In the antibiotic  Ciprofloxacin to take twice daily for 5 days   Please take a probiotic ( Align, Floraque or Culturelle), the generic version of one of these over the counter medications, or an alternative form (kombucha,  Yogurt, or another dietary source) for a minimum of 3 weeks to prevent a serious antibiotic associated diarrhea  Called clostridium dificile colitis.  Taking a probiotic may also prevent vaginitis due to yeast infections and can be continued indefinitely if you feel that it improves your digestion or your elimination (bowels).

## 2020-08-13 ENCOUNTER — Telehealth: Payer: Self-pay

## 2020-08-13 ENCOUNTER — Telehealth: Payer: Self-pay | Admitting: Internal Medicine

## 2020-08-13 NOTE — Telephone Encounter (Signed)
Called and spoke to Emily Wu's daughter, Emily Wu. Emily Wu was requesting a referral to [facility] in search of continuous care for Emily Wu. Emily Wu reports that Emily Wu is having difficulties with her memory and they have lost some caregivers due to Emily Wu's behavior. Emily Wu states that she would like to postpone the referral due to palliative care coming to the home on 08/14/20 and she will ask them for advice first.

## 2020-08-13 NOTE — Telephone Encounter (Signed)
Pt called about UTI results and wants to know if it makes you dizzy? Please advise

## 2020-08-13 NOTE — Telephone Encounter (Signed)
Please clarify ; is she requesting social work consult for placement ?

## 2020-08-13 NOTE — Telephone Encounter (Signed)
Pt daughter Gaetano Net sent in a mychart message requesting a referral to [facility] due to issues with current caregivers. Mychart Message has been sent to PCP.

## 2020-08-14 ENCOUNTER — Telehealth: Payer: Self-pay | Admitting: Internal Medicine

## 2020-08-14 ENCOUNTER — Other Ambulatory Visit: Payer: Self-pay | Admitting: Internal Medicine

## 2020-08-14 ENCOUNTER — Encounter: Payer: 59 | Admitting: Physical Therapy

## 2020-08-14 ENCOUNTER — Other Ambulatory Visit: Payer: Medicare Other

## 2020-08-14 DIAGNOSIS — Z515 Encounter for palliative care: Secondary | ICD-10-CM

## 2020-08-14 MED ORDER — FAMOTIDINE 20 MG PO TABS
20.0000 mg | ORAL_TABLET | Freq: Every day | ORAL | 0 refills | Status: DC
Start: 2020-08-14 — End: 2021-11-20

## 2020-08-14 MED ORDER — FAMOTIDINE 20 MG PO TABS: 20.0000 mg | ORAL_TABLET | Freq: Every day | ORAL | 0 refills | Status: DC

## 2020-08-14 NOTE — Addendum Note (Signed)
Addended by: Elpidio Galea T on: 08/14/2020 12:45 PM   Modules accepted: Orders

## 2020-08-14 NOTE — Telephone Encounter (Signed)
Pt needs a new rx refill on famotidine (PEPCID) 20 MG tablet  [facility] is saying that they didn't have a rx for on file for  LORazepam (ATIVAN) 0.5 MG tablet  [facility] said the rx went to [facility] not to them

## 2020-08-14 NOTE — Telephone Encounter (Signed)
Jeneen Rinks from [facility] called in stated that patient missed visit today because of meeting

## 2020-08-14 NOTE — Telephone Encounter (Signed)
Attempted to contact Pt daughter Claudean Kinds, Voicemail was full and could not leave a message.

## 2020-08-14 NOTE — Progress Notes (Signed)
COMMUNITY PALLIATIVE CARE SW NOTE  PATIENT NAME: Emily Wu DOB: 22-Mar-1931 MRN: [id number]  PRIMARY CARE PROVIDER: Crecencio Mc, MD  RESPONSIBLE PARTY:  Acct ID - Guarantor Home Phone Work Phone Relationship Acct Type  [id number] DANI, [phone]  Self P/F     [address], [place], [place] [address]     PLAN OF CARE and INTERVENTIONS:             GOALS OF CARE/ ADVANCE CARE PLANNING:  Patient is a DNR. Patient's goal is to remain at home for as long as possible. Patients daughter Emily Wu is HCPOA. 2. SOCIAL/EMOTIONAL/SPIRITUAL ASSESSMENT/ INTERVENTIONS:  SW and Emily Wu met with patient and patient's daughter Emily Wu in patients home. Patient lives in a one story home alone. Patient has 24/7 private duty caregivers through [place]. Patient was admitted to the hospital on 8/23 for COVID-19. Patient sleeps majority of the day. Patient has poor appetite and fluid intake. No falls. Per daughter patient is taking more medications than previously since recent hospitalization. Patient with recent dx of UTI as well. Per daughter patients confusion has improved since patient is no longer taking Marinol and Lorazepam has been decreased. Patient fixated on her bowels and is currently taking lactulose scheduled.  She is noted to have a very small bm x 2 today.  Daughter reports patient also had a very small bm on Saturday. Prior to this hospital admission patient was living independently at home, driving and managing her affairs. SW discussed goals, reviewed care plan, provided emotional support, used active and reflective listening. RN discussed Hospice services at length with daughter. Hospice to complete assessment for possible admission.  3. PATIENT/CAREGIVER EDUCATION/ COPING:  Patient A&O during visit, able to answer questions with assistance of daughter. Per daughter se feels patient is more depressed and still grieving. Patients husband deceased 6 months ago.  Patient has 2  daughters whom are both supportive and live locally.  4. PERSONAL EMERGENCY PLAN:  Patient/family/caregiver will call 9-1-1 for emergencies.  5. COMMUNITY RESOURCES COORDINATION/ HEALTH CARE NAVIGATION:  Patient's daughters  manages patient care. 6. FINANCIAL/LEGAL CONCERNS/INTERVENTIONS:  None.     SOCIAL HX:  Social History   Tobacco Use  . Smoking status: Former Smoker    Packs/day: 2.00    Years: 7.00    Pack years: 14.00    Types: Cigarettes  . Smokeless tobacco: Never Used  . Tobacco comment: 02/03/2014 "quit smoking in the 1960's"  Substance Use Topics  . Alcohol use: No    CODE STATUS: DNR ADVANCED DIRECTIVES: Y MOST FORM COMPLETE: N HOSPICE EDUCATION PROVIDED: Y  PPS: Patient is ambulatory with RW. Patient requires assistance with bathing and hygiene. Patient wears brief and needs assistance with changing them. Caregivers does meal prep. Patient is able to feed self.   Time Spent: 1 hr.       Emily Eland, LCSW

## 2020-08-14 NOTE — Addendum Note (Signed)
Addended by: Elpidio Galea T on: 08/14/2020 12:46 PM   Modules accepted: Orders

## 2020-08-15 ENCOUNTER — Telehealth: Payer: Self-pay | Admitting: Internal Medicine

## 2020-08-15 ENCOUNTER — Other Ambulatory Visit: Payer: Self-pay

## 2020-08-15 NOTE — Telephone Encounter (Signed)
Yes I will

## 2020-08-15 NOTE — Telephone Encounter (Signed)
Also received voicemail will try AM.

## 2020-08-15 NOTE — Telephone Encounter (Signed)
8pm TC from hospice triage nurse Mariann Laster, relaying TC from patient's daughter Emily Wu. Emily Wu reports patient with abdominal pain, and only two bowel movements (small amounts hard small stool) over the last week despite taking lactulose q4h (prescribed on the 14th).  I called and spoke to daughter Emily Wu [phone]) and hired caregiver [place] [phone]). Nichole reports patient with firm abdomen, and hemorrhoidal pain.  I recommended administration of a Fleets Saline Enema pr, repeat x 1 if no results. I reviewed instructions with Nichole how to administer. Nichole later called me and reported that she is unable to administer enema under her license. She will ask patient's other daughter [name] to do this when [name] delivers the enema to the home. I also asked family to pick up Preparation H for hemorrhoidal pain.  Emily Wu reports that patient has been referred for hospice evaluation, but first visit has not yet been scheduled.  Violeta Gelinas NP-C [phone]

## 2020-08-15 NOTE — Telephone Encounter (Signed)
FYI

## 2020-08-15 NOTE — Telephone Encounter (Signed)
Roderic Ovens from [facility] called wanted to know if Dr.Tullo will  be the attending doctor for Hospice for patient

## 2020-08-15 NOTE — Telephone Encounter (Signed)
Attempted to speak to [facility] 3 times. The receptionist hung up the phone while I was speaking 2 times. On the third, she stated the connection was bad. I was transferred to [facility] and it went to voicemail. Left a message to return the call.

## 2020-08-16 ENCOUNTER — Telehealth: Payer: Self-pay | Admitting: Internal Medicine

## 2020-08-16 ENCOUNTER — Encounter: Payer: 59 | Admitting: Physical Therapy

## 2020-08-16 DIAGNOSIS — R911 Solitary pulmonary nodule: Secondary | ICD-10-CM | POA: Diagnosis not present

## 2020-08-16 DIAGNOSIS — F329 Major depressive disorder, single episode, unspecified: Secondary | ICD-10-CM | POA: Diagnosis not present

## 2020-08-16 DIAGNOSIS — I129 Hypertensive chronic kidney disease with stage 1 through stage 4 chronic kidney disease, or unspecified chronic kidney disease: Secondary | ICD-10-CM | POA: Diagnosis not present

## 2020-08-16 DIAGNOSIS — E43 Unspecified severe protein-calorie malnutrition: Secondary | ICD-10-CM | POA: Diagnosis not present

## 2020-08-16 DIAGNOSIS — U071 COVID-19: Secondary | ICD-10-CM | POA: Diagnosis not present

## 2020-08-16 DIAGNOSIS — N183 Chronic kidney disease, stage 3 unspecified: Secondary | ICD-10-CM | POA: Diagnosis not present

## 2020-08-16 NOTE — Telephone Encounter (Signed)
Pt daughter called to let Dr. Derrel Nip know that the Lactulose 20 GM/30ML SOLN is not working and they need something else called in

## 2020-08-16 NOTE — Telephone Encounter (Signed)
There is nothing stronger that I can prescribe. If she is having smaller stools she may not need anything else until her appetite picks up.  They will need to schedule a virtual visit to discuss

## 2020-08-16 NOTE — Telephone Encounter (Signed)
Verbal order given PCP will act as attending for patient during hospice services.

## 2020-08-17 ENCOUNTER — Telehealth: Payer: Self-pay | Admitting: Internal Medicine

## 2020-08-17 MED ORDER — TRAZODONE HCL 50 MG PO TABS: 25.0000 mg | ORAL_TABLET | Freq: Every evening | ORAL | 0 refills | Status: DC | PRN

## 2020-08-17 MED ORDER — MIRTAZAPINE 15 MG PO TABS
30.0000 mg | ORAL_TABLET | Freq: Every day | ORAL | 0 refills | Status: DC
Start: 2020-08-17 — End: 2020-09-13

## 2020-08-17 MED ORDER — LORAZEPAM 0.5 MG PO TABS: 0.2500 mg | ORAL_TABLET | Freq: Four times a day (QID) | ORAL | 0 refills | Status: DC | PRN

## 2020-08-17 NOTE — Telephone Encounter (Signed)
Emily Wu for [facility], patient is new to pharmacy. She is requesting a her traZODone (DESYREL) 50 MG tablet and LORazepam (ATIVAN) 0.5 MG tablet. And need to verify the dosage on her mirtazapine (REMERON) 15 MG tablet, pharmacy said it looks like the hospital changed dosage.

## 2020-08-17 NOTE — Telephone Encounter (Signed)
Medication refill: Gaspar Bidding Last seen:08-07-20 Last Ordered:08-07-20

## 2020-08-17 NOTE — Telephone Encounter (Signed)
Lorazepam refilled   remeron dose is 30 mg per night,  Per chart please convey to TC  And refill it as well as trazodone

## 2020-08-17 NOTE — Telephone Encounter (Signed)
Called and let a verbal message with Nicole Kindred to have one of [place] daughters call back.

## 2020-08-17 NOTE — Telephone Encounter (Signed)
Called and let a verbal message with Nicole Kindred to have one of [place] daughters call back.

## 2020-08-20 NOTE — Telephone Encounter (Signed)
Called and spoke to [place]. Emily Wu states that her appetite has picked back up and she is eating fine. She states that her bowel movements are coming normally and she is having no issues using the restroom.

## 2020-08-21 ENCOUNTER — Encounter: Payer: 59 | Admitting: Physical Therapy

## 2020-08-22 DIAGNOSIS — F329 Major depressive disorder, single episode, unspecified: Secondary | ICD-10-CM | POA: Diagnosis not present

## 2020-08-22 DIAGNOSIS — U071 COVID-19: Secondary | ICD-10-CM | POA: Diagnosis not present

## 2020-08-22 DIAGNOSIS — E43 Unspecified severe protein-calorie malnutrition: Secondary | ICD-10-CM | POA: Diagnosis not present

## 2020-08-22 DIAGNOSIS — I129 Hypertensive chronic kidney disease with stage 1 through stage 4 chronic kidney disease, or unspecified chronic kidney disease: Secondary | ICD-10-CM | POA: Diagnosis not present

## 2020-08-22 DIAGNOSIS — R911 Solitary pulmonary nodule: Secondary | ICD-10-CM | POA: Diagnosis not present

## 2020-08-22 DIAGNOSIS — N183 Chronic kidney disease, stage 3 unspecified: Secondary | ICD-10-CM | POA: Diagnosis not present

## 2020-08-23 ENCOUNTER — Encounter: Payer: 59 | Admitting: Physical Therapy

## 2020-08-24 DIAGNOSIS — E43 Unspecified severe protein-calorie malnutrition: Secondary | ICD-10-CM | POA: Diagnosis not present

## 2020-08-24 DIAGNOSIS — U071 COVID-19: Secondary | ICD-10-CM | POA: Diagnosis not present

## 2020-08-24 DIAGNOSIS — N183 Chronic kidney disease, stage 3 unspecified: Secondary | ICD-10-CM | POA: Diagnosis not present

## 2020-08-24 DIAGNOSIS — R911 Solitary pulmonary nodule: Secondary | ICD-10-CM | POA: Diagnosis not present

## 2020-08-24 DIAGNOSIS — I129 Hypertensive chronic kidney disease with stage 1 through stage 4 chronic kidney disease, or unspecified chronic kidney disease: Secondary | ICD-10-CM | POA: Diagnosis not present

## 2020-08-24 DIAGNOSIS — F329 Major depressive disorder, single episode, unspecified: Secondary | ICD-10-CM | POA: Diagnosis not present

## 2020-08-29 DIAGNOSIS — F329 Major depressive disorder, single episode, unspecified: Secondary | ICD-10-CM | POA: Diagnosis not present

## 2020-08-29 DIAGNOSIS — U071 COVID-19: Secondary | ICD-10-CM | POA: Diagnosis not present

## 2020-08-29 DIAGNOSIS — I129 Hypertensive chronic kidney disease with stage 1 through stage 4 chronic kidney disease, or unspecified chronic kidney disease: Secondary | ICD-10-CM | POA: Diagnosis not present

## 2020-08-29 DIAGNOSIS — E43 Unspecified severe protein-calorie malnutrition: Secondary | ICD-10-CM | POA: Diagnosis not present

## 2020-08-29 DIAGNOSIS — R911 Solitary pulmonary nodule: Secondary | ICD-10-CM | POA: Diagnosis not present

## 2020-08-29 DIAGNOSIS — N183 Chronic kidney disease, stage 3 unspecified: Secondary | ICD-10-CM | POA: Diagnosis not present

## 2020-09-02 DIAGNOSIS — I129 Hypertensive chronic kidney disease with stage 1 through stage 4 chronic kidney disease, or unspecified chronic kidney disease: Secondary | ICD-10-CM | POA: Diagnosis not present

## 2020-09-02 DIAGNOSIS — I447 Left bundle-branch block, unspecified: Secondary | ICD-10-CM | POA: Diagnosis not present

## 2020-09-02 DIAGNOSIS — I951 Orthostatic hypotension: Secondary | ICD-10-CM | POA: Diagnosis not present

## 2020-09-02 DIAGNOSIS — F329 Major depressive disorder, single episode, unspecified: Secondary | ICD-10-CM | POA: Diagnosis not present

## 2020-09-02 DIAGNOSIS — Z9181 History of falling: Secondary | ICD-10-CM | POA: Diagnosis not present

## 2020-09-02 DIAGNOSIS — E785 Hyperlipidemia, unspecified: Secondary | ICD-10-CM | POA: Diagnosis not present

## 2020-09-02 DIAGNOSIS — N183 Chronic kidney disease, stage 3 unspecified: Secondary | ICD-10-CM | POA: Diagnosis not present

## 2020-09-02 DIAGNOSIS — F419 Anxiety disorder, unspecified: Secondary | ICD-10-CM | POA: Diagnosis not present

## 2020-09-02 DIAGNOSIS — K219 Gastro-esophageal reflux disease without esophagitis: Secondary | ICD-10-CM | POA: Diagnosis not present

## 2020-09-02 DIAGNOSIS — R911 Solitary pulmonary nodule: Secondary | ICD-10-CM | POA: Diagnosis not present

## 2020-09-02 DIAGNOSIS — K635 Polyp of colon: Secondary | ICD-10-CM | POA: Diagnosis not present

## 2020-09-02 DIAGNOSIS — U071 COVID-19: Secondary | ICD-10-CM | POA: Diagnosis not present

## 2020-09-02 DIAGNOSIS — Z87891 Personal history of nicotine dependence: Secondary | ICD-10-CM | POA: Diagnosis not present

## 2020-09-02 DIAGNOSIS — E43 Unspecified severe protein-calorie malnutrition: Secondary | ICD-10-CM | POA: Diagnosis not present

## 2020-09-02 DIAGNOSIS — M545 Low back pain, unspecified: Secondary | ICD-10-CM | POA: Diagnosis not present

## 2020-09-02 DIAGNOSIS — E538 Deficiency of other specified B group vitamins: Secondary | ICD-10-CM | POA: Diagnosis not present

## 2020-09-02 DIAGNOSIS — R32 Unspecified urinary incontinence: Secondary | ICD-10-CM | POA: Diagnosis not present

## 2020-09-02 DIAGNOSIS — M1711 Unilateral primary osteoarthritis, right knee: Secondary | ICD-10-CM | POA: Diagnosis not present

## 2020-09-02 DIAGNOSIS — E039 Hypothyroidism, unspecified: Secondary | ICD-10-CM | POA: Diagnosis not present

## 2020-09-03 ENCOUNTER — Encounter: Payer: Self-pay | Admitting: Internal Medicine

## 2020-09-03 ENCOUNTER — Telehealth: Payer: Self-pay

## 2020-09-03 NOTE — Telephone Encounter (Signed)
Pt needs an appointment to evaluate nodules in her lungs that wasn't address before. She needs a late morning or afternoon appointment. [name] only wants to see Dr. Derrel Nip regarding this. Please let me know where we can schedule this.

## 2020-09-03 NOTE — Telephone Encounter (Signed)
Schedule when there is an available appointment. Pulmonary nodules are not urgent .  They are followed over time with repeat CT scans once I make a referral to the  pulmonary nodule clinic

## 2020-09-03 NOTE — Telephone Encounter (Signed)
957 am.  Phone call made to Emily Wu to schedule a follow up appointment with patient.  Patient declined hospice admission due to improvement in her condition.  She has elected to continue with Palliative Care.  Visit scheduled for Thursday at 1 pm.

## 2020-09-03 NOTE — Telephone Encounter (Signed)
Please advise 

## 2020-09-03 NOTE — Telephone Encounter (Signed)
Pt has been scheduled for the next available appt and daughter is aware of appt date and time.

## 2020-09-04 DIAGNOSIS — U071 COVID-19: Secondary | ICD-10-CM | POA: Diagnosis not present

## 2020-09-04 DIAGNOSIS — F329 Major depressive disorder, single episode, unspecified: Secondary | ICD-10-CM | POA: Diagnosis not present

## 2020-09-04 DIAGNOSIS — E43 Unspecified severe protein-calorie malnutrition: Secondary | ICD-10-CM | POA: Diagnosis not present

## 2020-09-04 DIAGNOSIS — R911 Solitary pulmonary nodule: Secondary | ICD-10-CM | POA: Diagnosis not present

## 2020-09-04 DIAGNOSIS — I129 Hypertensive chronic kidney disease with stage 1 through stage 4 chronic kidney disease, or unspecified chronic kidney disease: Secondary | ICD-10-CM | POA: Diagnosis not present

## 2020-09-04 DIAGNOSIS — N183 Chronic kidney disease, stage 3 unspecified: Secondary | ICD-10-CM | POA: Diagnosis not present

## 2020-09-06 ENCOUNTER — Other Ambulatory Visit: Payer: Medicare Other

## 2020-09-06 ENCOUNTER — Other Ambulatory Visit: Payer: Self-pay

## 2020-09-06 VITALS — BP 106/50 | HR 86 | Wt 117.5 lb

## 2020-09-06 DIAGNOSIS — Z515 Encounter for palliative care: Secondary | ICD-10-CM

## 2020-09-06 NOTE — Progress Notes (Signed)
PATIENT NAME: Emily Wu DOB: Dec 27, 1930 MRN: [id number]  PRIMARY CARE PROVIDER: Crecencio Mc, MD  RESPONSIBLE PARTY:  Acct ID - Guarantor Home Phone Work Phone Relationship Acct Type  [id number] GLENYS, [phone]  Self P/F     [address], [place], [place] [address]    PLAN OF CARE and INTERVENTIONS:               1.  GOALS OF CARE/ ADVANCE CARE PLANNING:  Remain home and independent.                           2.  PATIENT/CAREGIVER EDUCATION:  Palliative Care Services and safety.               5.  DISEASE STATUS:  Arrived at patient's home at the same time she was returning.  Patient went out with her caregiver and daughter [name] for lunch.  Patient was seen by hospice on 08/24/20 but declined services due to significant improvement in overall condition.  Patient is ambulating with a rolling walker and stand by assist.  She just completed PT this week but feels she can benefit from more.  Her first 3 weeks of PT were limited due to her cognitive status and significant weakness. Patient is able to participate in this visit and states she does not recall my last visit.  There is some concern over forgetfulness that was noted prior to her hospitalization for COVID-19. Patient has an upcoming appointment with Dr. Derrel Nip and patient and daughter intend to further discuss this and possibly have further evaluations completed.  Daughter has asked about using a cane but I have advised to continue with the rolling walker to provide more stability.  Patient denies any pain.  No complaints of shortness of breath.  She does report dizziness when she turns her head to the right. Her appetite has improved significantly and she is eating 3 meals a day.  She has gained 10 lbs since my last visit on 08/14/20.  Currently weighs 117.5 lbs.  Patient reports regular BM's with her last one yesterday.  She continues with senokot 2 tabs in the am.  Medications reviewed and patient is no longer taking supplements.   She continues with aspirin, multivitamin and senokot in the am.  1/2 tab of ativan, 1/2 tab of trazodone and 2 tabs of remeron at bedtime.  I have provided information on Palliative Care services as patient did not recall my last visit.  Reviewed code status and patient confirms DNR.  We discussed the MOST form and she would like to complete this.   HISTORY OF PRESENT ILLNESS:  86 year old female with h/o COVID 19 on 07/16/20 with hospital admission.  She is currently being followed by Palliative Care monthly and PRN.  CODE STATUS: DNR  ADVANCED DIRECTIVES: N MOST FORM: No PPS: 50%   PHYSICAL EXAM:   VITALS:bp 106/50 p 86 r 20 O2 Sat 97% LUNGS: CTA CARDIAC:  HRR EXTREMITIES: No edema SKIN: Warm/dry and intact.  No skin breakdown reported by patient  NEURO: A & O with some forgetfulness.       Lorenza Burton, RN

## 2020-09-10 DIAGNOSIS — N183 Chronic kidney disease, stage 3 unspecified: Secondary | ICD-10-CM | POA: Diagnosis not present

## 2020-09-10 DIAGNOSIS — R911 Solitary pulmonary nodule: Secondary | ICD-10-CM | POA: Diagnosis not present

## 2020-09-10 DIAGNOSIS — U071 COVID-19: Secondary | ICD-10-CM | POA: Diagnosis not present

## 2020-09-10 DIAGNOSIS — I129 Hypertensive chronic kidney disease with stage 1 through stage 4 chronic kidney disease, or unspecified chronic kidney disease: Secondary | ICD-10-CM | POA: Diagnosis not present

## 2020-09-10 DIAGNOSIS — F329 Major depressive disorder, single episode, unspecified: Secondary | ICD-10-CM | POA: Diagnosis not present

## 2020-09-10 DIAGNOSIS — E43 Unspecified severe protein-calorie malnutrition: Secondary | ICD-10-CM | POA: Diagnosis not present

## 2020-09-13 ENCOUNTER — Encounter: Payer: Self-pay | Admitting: Internal Medicine

## 2020-09-13 ENCOUNTER — Other Ambulatory Visit: Payer: Self-pay

## 2020-09-13 ENCOUNTER — Ambulatory Visit (INDEPENDENT_AMBULATORY_CARE_PROVIDER_SITE_OTHER): Payer: Medicare Other | Admitting: Internal Medicine

## 2020-09-13 ENCOUNTER — Ambulatory Visit (INDEPENDENT_AMBULATORY_CARE_PROVIDER_SITE_OTHER): Payer: Medicare Other

## 2020-09-13 VITALS — BP 136/68 | HR 83 | Temp 98.2°F | Resp 15 | Ht 62.0 in | Wt 115.4 lb

## 2020-09-13 DIAGNOSIS — Z9189 Other specified personal risk factors, not elsewhere classified: Secondary | ICD-10-CM | POA: Diagnosis not present

## 2020-09-13 DIAGNOSIS — F320 Major depressive disorder, single episode, mild: Secondary | ICD-10-CM

## 2020-09-13 DIAGNOSIS — H8111 Benign paroxysmal vertigo, right ear: Secondary | ICD-10-CM | POA: Diagnosis not present

## 2020-09-13 DIAGNOSIS — F0390 Unspecified dementia without behavioral disturbance: Secondary | ICD-10-CM | POA: Diagnosis not present

## 2020-09-13 DIAGNOSIS — F0150 Vascular dementia without behavioral disturbance: Secondary | ICD-10-CM | POA: Insufficient documentation

## 2020-09-13 DIAGNOSIS — F0280 Dementia in other diseases classified elsewhere without behavioral disturbance: Secondary | ICD-10-CM | POA: Insufficient documentation

## 2020-09-13 DIAGNOSIS — M6281 Muscle weakness (generalized): Secondary | ICD-10-CM

## 2020-09-13 DIAGNOSIS — E43 Unspecified severe protein-calorie malnutrition: Secondary | ICD-10-CM | POA: Diagnosis not present

## 2020-09-13 DIAGNOSIS — R911 Solitary pulmonary nodule: Secondary | ICD-10-CM | POA: Diagnosis not present

## 2020-09-13 LAB — TSH: TSH: 5.06 u[IU]/mL — ABNORMAL HIGH (ref 0.35–4.50)

## 2020-09-13 LAB — VITAMIN B12: Vitamin B-12: 431 pg/mL (ref 211–911)

## 2020-09-13 MED ORDER — LORAZEPAM 0.5 MG PO TABS
0.5000 mg | ORAL_TABLET | Freq: Every day | ORAL | 5 refills | Status: DC
Start: 2020-09-13 — End: 2021-03-28

## 2020-09-13 MED ORDER — MIRTAZAPINE 30 MG PO TABS
30.0000 mg | ORAL_TABLET | Freq: Every day | ORAL | 1 refills | Status: DC
Start: 2020-09-13 — End: 2021-02-28

## 2020-09-13 NOTE — Progress Notes (Signed)
Subjective:  Patient ID: Emily Wu, female    DOB: 1931/11/18  Age: 86 y.o. MRN: [id number]  CC: The primary encounter diagnosis was Dementia without behavioral disturbance, unspecified dementia type ([place]). Diagnoses of Lung nodule, solitary, Pulmonary nodule less than 1 cm in diameter with low risk for malignant neoplasm, Severe protein-calorie malnutrition ([place]), Benign positional vertigo, right, Current mild episode of major depressive disorder, unspecified whether recurrent ([place]), and Generalized muscle weakness were also pertinent to this visit.  HPI BURNELL MATLIN presents for follow up on mental status changes., weight loss,  Followin hospitalization for covid [facility]   CLINICALLY improved since last visit.  ambulating with a walker .  Wants to continue PT  Weight loss: . Weight gain achieved.  Appetite improved, but sleeping 12 to 15 hours per night per daughter.  Taking trazodone, lorazepam and mirtazapine   Having recurrent vertigo with turning head to right side .  Extinguishes after a few seconds, no nausea   Mild cognitive issues noted by family prior to COVID infection . requesting testing   2 mm pulmonary nodule noted on chest film during August hospitalization.  Ex smoker  Follow up advised, but confluence of shadows opined by radiology  Using Sennakot daily to move bowels.  Discussed a safer regimen     Outpatient Medications Prior to Visit  Medication Sig Dispense Refill  . acetaminophen (TYLENOL) 650 MG CR tablet Take 650 mg by mouth every 8 (eight) hours as needed for pain.     Marland Kitchen aspirin EC 81 MG EC tablet Take 1 tablet (81 mg total) by mouth daily. Swallow whole. 30 tablet 0  . atorvastatin (LIPITOR) 20 MG tablet TAKE ONE TABLET EVERY DAY (Patient taking differently: Take 20 mg by mouth daily. ) 90 tablet 1  . diclofenac sodium (VOLTAREN) 1 % GEL Apply 2 g topically 4 (four) times daily as needed (for knee pain). 200 g 11  . DOTTI 0.1 MG/24HR patch CHANGE  PATCH TWICE WEEKLY 8 patch 0  . famotidine (PEPCID) 20 MG tablet Take 1 tablet (20 mg total) by mouth daily. 30 tablet 0  . Lactulose 20 GM/30ML SOLN 30 ml every 4 hours until constipation is relieved 236 mL 3  . Multiple Vitamins-Minerals (CENTRUM SILVER PO) Take 1 tablet by mouth daily.    . polyethylene glycol (MIRALAX / GLYCOLAX) packet Take 17 g by mouth daily as needed for moderate constipation.     . protein supplement (RESOURCE BENEPROTEIN) POWD Take 1 scoop by mouth daily as needed (constipation).     . sennosides-docusate sodium (SENOKOT-S) 8.6-50 MG tablet Take 2 tablets by mouth daily. 30 tablet 1  . traZODone (DESYREL) 50 MG tablet Take 0.5 tablets (25 mg total) by mouth at bedtime as needed for sleep. 20 tablet 0  . LORazepam (ATIVAN) 0.5 MG tablet Take 0.5 tablets (0.25 mg total) by mouth every 6 (six) hours as needed for anxiety. 60 tablet 0  . mirtazapine (REMERON) 15 MG tablet Take 2 tablets (30 mg total) by mouth at bedtime. 60 tablet 0  . ascorbic acid (VITAMIN C) 500 MG tablet Take 1 tablet (500 mg total) by mouth daily. (Patient not taking: Reported on 09/13/2020) 30 tablet 0  . cholecalciferol (VITAMIN D) 25 MCG tablet Take 1 tablet (1,000 Units total) by mouth daily. (Patient not taking: Reported on 09/13/2020) 30 tablet 0  . liothyronine (CYTOMEL) 5 MCG tablet TAKE 2 TABLETS BY MOUTH DAILY (Patient not taking: Reported on 09/13/2020) 180 tablet  0  . methocarbamol (ROBAXIN) 500 MG tablet Take 1 tablet (500 mg total) by mouth every 8 (eight) hours as needed for muscle spasms. (Patient not taking: Reported on 09/13/2020) 30 tablet 0  . ondansetron (ZOFRAN) 4 MG tablet Take 1 tablet (4 mg total) by mouth every 6 (six) hours as needed for nausea. (Patient not taking: Reported on 09/13/2020) 20 tablet 0  . vitamin B-12 (CYANOCOBALAMIN) 1000 MCG tablet Take 1,000 mcg by mouth daily.  (Patient not taking: Reported on 09/13/2020)    . zinc sulfate 220 (50 Zn) MG capsule Take 1 capsule  (220 mg total) by mouth daily. (Patient not taking: Reported on 09/13/2020) 30 capsule 0  . ciprofloxacin (CIPRO) 250 MG tablet Take 1 tablet (250 mg total) by mouth 2 (two) times daily. (Patient not taking: Reported on 09/13/2020) 10 tablet 0  . guaiFENesin (MUCINEX) 600 MG 12 hr tablet Take 1 tablet (600 mg total) by mouth 2 (two) times daily. (Patient not taking: Reported on 09/13/2020) 30 tablet 0  . guaiFENesin-dextromethorphan (ROBITUSSIN DM) 100-10 MG/5ML syrup Take 10 mLs by mouth every 4 (four) hours as needed for cough. (Patient not taking: Reported on 09/13/2020) 118 mL 0   No facility-administered medications prior to visit.    Review of Systems;  Patient denies headache, fevers, malaise, unintentional weight loss, skin rash, eye pain, sinus congestion and sinus pain, sore throat, dysphagia,  hemoptysis , cough, dyspnea, wheezing, chest pain, palpitations, orthopnea, edema, abdominal pain, nausea, melena, diarrhea, constipation, flank pain, dysuria, hematuria, urinary  Frequency, nocturia, numbness, tingling, seizures,  Focal weakness, Loss of consciousness,  Tremor, insomnia, depression, anxiety, and suicidal ideation.      Objective:  BP 136/68 (BP Location: Left Arm, Patient Position: Sitting, Cuff Size: Normal)   Pulse 83   Temp 98.2 F (36.8 C) (Oral)   Resp 15   Ht 5\' 2"  (1.575 m)   Wt 115 lb 6.4 oz (52.3 kg)   SpO2 98%   BMI 21.11 kg/m   BP Readings from Last 3 Encounters:  09/13/20 136/68  09/06/20 (!) 106/50  08/07/20 (!) 108/54    Wt Readings from Last 3 Encounters:  09/13/20 115 lb 6.4 oz (52.3 kg)  09/06/20 117 lb 8 oz (53.3 kg)  07/16/20 111 lb (50.3 kg)    General appearance: alert, cooperative and appears stated age Ears: normal TM's and external ear canals both ears Throat: lips, mucosa, and tongue normal; teeth and gums normal Neck: no adenopathy, no carotid bruit, supple, symmetrical, trachea midline and thyroid not enlarged, symmetric, no  tenderness/mass/nodules Back: symmetric, no curvature. ROM normal. No CVA tenderness. Lungs: clear to auscultation bilaterally Heart: regular rate and rhythm, S1, S2 normal, no murmur, click, rub or gallop Abdomen: soft, non-tender; bowel sounds normal; no masses,  no organomegaly Pulses: 2+ and symmetric Skin: Skin color, texture, turgor normal. No rashes or lesions Lymph nodes: Cervical, supraclavicular, and axillary nodes normal.  mini-mental status exam done today :  Oriented to date  But not day .     Recall:   2 of 3 instant ,  0 of 3  At 5 minutes  Concentration :  6 qtrs in a dollar:  6  Nickels in  A dollar ?  Don't know  Cannot subtract serial 7's  Cannot draw clock face with hands for 11:10   Lab Results  Component Value Date   HGBA1C 5.8 08/18/2019    Lab Results  Component Value Date   CREATININE 0.93 07/28/2020  CREATININE 1.02 (H) 07/27/2020   CREATININE 0.74 07/26/2020    Lab Results  Component Value Date   WBC 6.6 07/28/2020   HGB 12.1 07/28/2020   HCT 37.0 07/28/2020   PLT 337 07/28/2020   GLUCOSE 95 07/28/2020   CHOL 161 08/18/2019   TRIG 206.0 (H) 08/18/2019   HDL 41.00 08/18/2019   LDLDIRECT 90.0 08/18/2019   LDLCALC 73 03/19/2018   ALT 22 07/28/2020   AST 27 07/28/2020   NA 137 07/28/2020   K 4.0 07/28/2020   CL 101 07/28/2020   CREATININE 0.93 07/28/2020   BUN 15 07/28/2020   CO2 28 07/28/2020   TSH 1.95 03/09/2020   INR 1.04 12/08/2018   HGBA1C 5.8 08/18/2019    No results found.  Assessment & Plan:   Problem List Items Addressed This Visit      Unprioritized   Benign positional vertigo, right    Symptoms are brief and self limiting , continue use of walker to avoid falls.  She was not orthostatic today MRI for dementia workup is in process and will rule out mass lesions      Dementia ([place]) - Primary    Her delirium has resolved.  MMSE was very poor today .  Metabolic screen in process and MRI ordered.  Neurology referral to  [facility] preferred by family       Relevant Medications   mirtazapine (REMERON) 30 MG tablet   LORazepam (ATIVAN) 0.5 MG tablet   Other Relevant Orders   TSH   Vitamin B12   RPR   MR Brain Wo Contrast   Ambulatory referral to Neurology   Depression    Aggravated by husband's death.  Symptoms all improved wit remeron.  No changes today       Relevant Medications   mirtazapine (REMERON) 30 MG tablet   LORazepam (ATIVAN) 0.5 MG tablet   Generalized muscle weakness    Improved strength and mobility with clearing of delirium and improved nutrition.  Will extend [place] PT as she is now more able to participate      Pulmonary nodule less than 1 cm in diameter with low risk for malignant neoplasm    Confluence of shadows suggested by radiology.  Repeat chest  X ray today ,  If still present will discuss CT with patient,      Severe protein-calorie malnutrition ([place])    She is gaining weight with use of mirtazipine and does not appear depressed       Other Visit Diagnoses    Lung nodule, solitary       Relevant Orders   DG Chest 2 View      I have discontinued Jerene Pitch. Lynes's guaiFENesin, guaiFENesin-dextromethorphan, and ciprofloxacin. I have also changed her mirtazapine and LORazepam. Additionally, I am having her maintain her acetaminophen, vitamin B-12, diclofenac sodium, Multiple Vitamins-Minerals (CENTRUM SILVER PO), polyethylene glycol, protein supplement, sennosides-docusate sodium, atorvastatin, Dotti, liothyronine, aspirin, ondansetron, methocarbamol, ascorbic acid, Vitamin D3, zinc sulfate, Lactulose, famotidine, and traZODone.  Meds ordered this encounter  Medications  . mirtazapine (REMERON) 30 MG tablet    Sig: Take 1 tablet (30 mg total) by mouth at bedtime.    Dispense:  90 tablet    Refill:  1  . LORazepam (ATIVAN) 0.5 MG tablet    Sig: Take 1 tablet (0.5 mg total) by mouth at bedtime.    Dispense:  30 tablet    Refill:  5    Medications Discontinued  During This Encounter  Medication Reason  . ciprofloxacin (CIPRO) 250 MG tablet Completed Course  . guaiFENesin (MUCINEX) 600 MG 12 hr tablet   . guaiFENesin-dextromethorphan (ROBITUSSIN DM) 100-10 MG/5ML syrup   . mirtazapine (REMERON) 15 MG tablet Reorder  . LORazepam (ATIVAN) 0.5 MG tablet    A total of 40 minutes was spent with patient more than half of which was spent in counseling patient on the above mentioned issues , reviewing and explaining recent labs and imaging studies done, and coordination of care.  Follow-up: No follow-ups on file.   Crecencio Mc, MD

## 2020-09-13 NOTE — Assessment & Plan Note (Signed)
Aggravated by husband's death.  Symptoms all improved wit remeron.  No changes today

## 2020-09-13 NOTE — Assessment & Plan Note (Signed)
Her delirium has resolved.  MMSE was very poor today .  Metabolic screen in process and MRI ordered.  Neurology referral to [facility] preferred by family

## 2020-09-13 NOTE — Patient Instructions (Addendum)
Reduce reduce use of Sennakot to every 3 or 4 days and use Colace and Miralax daily to manage constipation.  If stools become loose, suspend the colace.   Stop trazodone  Increase lorazepam to 1 tablet at night.  Reduce if she continues to sleep 12 to 15 hours per night.  Afternoon naps are ok.   Mirtazipine is refilled at the  higher dose (30 mg )  so just take 1 tablet at night   [facility]   Chest  X ray today to see if the nodule is just a confluence of shadows,  As opined by the radiologist

## 2020-09-13 NOTE — Assessment & Plan Note (Signed)
She is gaining weight with use of mirtazipine and does not appear depressed

## 2020-09-13 NOTE — Assessment & Plan Note (Signed)
Symptoms are brief and self limiting , continue use of walker to avoid falls.  She was not orthostatic today MRI for dementia workup is in process and will rule out mass lesions

## 2020-09-13 NOTE — Assessment & Plan Note (Signed)
Confluence of shadows suggested by radiology.  Repeat chest  X ray today ,  If still present will discuss CT with patient,

## 2020-09-13 NOTE — Assessment & Plan Note (Signed)
Improved strength and mobility with clearing of delirium and improved nutrition.  Will extend [place] PT as she is now more able to participate

## 2020-09-14 ENCOUNTER — Other Ambulatory Visit: Payer: Self-pay | Admitting: Internal Medicine

## 2020-09-14 LAB — RPR: RPR Ser Ql: NONREACTIVE

## 2020-09-14 MED ORDER — LIOTHYRONINE SODIUM 5 MCG PO TABS
5.0000 ug | ORAL_TABLET | Freq: Every day | ORAL | 1 refills | Status: DC
Start: 2020-09-14 — End: 2021-06-13

## 2020-09-14 NOTE — Addendum Note (Signed)
Addended by: Crecencio Mc on: 09/14/2020 03:06 PM   Modules accepted: Orders

## 2020-09-14 NOTE — Progress Notes (Signed)
Your thyroid is slightly underactive.  I would like you to resume a lower dose of yourcytomel  (start taking 1 tablet daily .  and  repeat the thyroid test in 6 weeks to be sure it  has corrected the level . Please make an appt for repeat testing at that time . You do not need to fast.   The other labs were normal.  .  [facility]

## 2020-09-15 NOTE — Progress Notes (Signed)
The 2 mm nodule was not clearly seen on follow up chest x ray.  We  will repeat her chest x ray in 6 months.

## 2020-09-18 ENCOUNTER — Telehealth: Payer: Self-pay

## 2020-09-18 DIAGNOSIS — F320 Major depressive disorder, single episode, mild: Secondary | ICD-10-CM | POA: Diagnosis not present

## 2020-09-18 DIAGNOSIS — R911 Solitary pulmonary nodule: Secondary | ICD-10-CM | POA: Diagnosis not present

## 2020-09-18 DIAGNOSIS — I10 Essential (primary) hypertension: Secondary | ICD-10-CM | POA: Diagnosis not present

## 2020-09-18 DIAGNOSIS — E785 Hyperlipidemia, unspecified: Secondary | ICD-10-CM | POA: Diagnosis not present

## 2020-09-18 DIAGNOSIS — F0390 Unspecified dementia without behavioral disturbance: Secondary | ICD-10-CM | POA: Diagnosis not present

## 2020-09-18 DIAGNOSIS — R531 Weakness: Secondary | ICD-10-CM | POA: Diagnosis not present

## 2020-09-18 DIAGNOSIS — E43 Unspecified severe protein-calorie malnutrition: Secondary | ICD-10-CM | POA: Diagnosis not present

## 2020-09-18 DIAGNOSIS — M1711 Unilateral primary osteoarthritis, right knee: Secondary | ICD-10-CM | POA: Diagnosis not present

## 2020-09-18 DIAGNOSIS — H8111 Benign paroxysmal vertigo, right ear: Secondary | ICD-10-CM | POA: Diagnosis not present

## 2020-09-18 NOTE — Telephone Encounter (Signed)
Left message with care giver to have daughter give [place] a call back in regards to lab results.

## 2020-09-18 NOTE — Telephone Encounter (Signed)
-----   Message from Crecencio Mc, MD sent at 09/14/2020  1:42 PM EDT ----- Your thyroid is slightly underactive.  I would like you to resume a lower dose of yourcytomel  (start taking 1 tablet daily .  and  repeat the thyroid test in 6 weeks to be sure it  has corrected the level . Please make an appt for repeat testing at that time . You do not need to fast.   The other labs were normal.  .  [facility]

## 2020-09-21 ENCOUNTER — Telehealth: Payer: Self-pay

## 2020-09-21 NOTE — Telephone Encounter (Signed)
Linton Rump from [facility] called to report patient missed visit today because she wasn't feeling well. Pt reported dizziness.

## 2020-09-21 NOTE — Telephone Encounter (Signed)
Tried to call patient on both numbers, but line was busy on one & other no answer w/ no VM set up.

## 2020-09-25 ENCOUNTER — Telehealth: Payer: Self-pay | Admitting: Internal Medicine

## 2020-09-25 DIAGNOSIS — F320 Major depressive disorder, single episode, mild: Secondary | ICD-10-CM | POA: Diagnosis not present

## 2020-09-25 DIAGNOSIS — F0390 Unspecified dementia without behavioral disturbance: Secondary | ICD-10-CM | POA: Diagnosis not present

## 2020-09-25 DIAGNOSIS — I10 Essential (primary) hypertension: Secondary | ICD-10-CM | POA: Diagnosis not present

## 2020-09-25 DIAGNOSIS — M1711 Unilateral primary osteoarthritis, right knee: Secondary | ICD-10-CM | POA: Diagnosis not present

## 2020-09-25 DIAGNOSIS — R911 Solitary pulmonary nodule: Secondary | ICD-10-CM | POA: Diagnosis not present

## 2020-09-25 DIAGNOSIS — H8111 Benign paroxysmal vertigo, right ear: Secondary | ICD-10-CM | POA: Diagnosis not present

## 2020-09-25 DIAGNOSIS — E43 Unspecified severe protein-calorie malnutrition: Secondary | ICD-10-CM | POA: Diagnosis not present

## 2020-09-25 DIAGNOSIS — R531 Weakness: Secondary | ICD-10-CM | POA: Diagnosis not present

## 2020-09-25 DIAGNOSIS — E785 Hyperlipidemia, unspecified: Secondary | ICD-10-CM | POA: Diagnosis not present

## 2020-09-25 NOTE — Telephone Encounter (Signed)
Pt daughter called returning your phone call Please call [name] at [phone]

## 2020-09-26 ENCOUNTER — Telehealth: Payer: Self-pay

## 2020-09-26 DIAGNOSIS — F0390 Unspecified dementia without behavioral disturbance: Secondary | ICD-10-CM | POA: Diagnosis not present

## 2020-09-26 DIAGNOSIS — I10 Essential (primary) hypertension: Secondary | ICD-10-CM | POA: Diagnosis not present

## 2020-09-26 DIAGNOSIS — R531 Weakness: Secondary | ICD-10-CM | POA: Diagnosis not present

## 2020-09-26 DIAGNOSIS — F320 Major depressive disorder, single episode, mild: Secondary | ICD-10-CM | POA: Diagnosis not present

## 2020-09-26 DIAGNOSIS — E43 Unspecified severe protein-calorie malnutrition: Secondary | ICD-10-CM | POA: Diagnosis not present

## 2020-09-26 DIAGNOSIS — H8111 Benign paroxysmal vertigo, right ear: Secondary | ICD-10-CM | POA: Diagnosis not present

## 2020-09-26 IMAGING — CT CT ANGIO CHEST
2 of 6 series · 18 of 46 positions shown · IV contrast (APPLIED)
Comparison: Plain film from earlier in the same day

CLINICAL DATA: Hypertension and shortness of breath

EXAM:
CT ANGIOGRAPHY CHEST WITH CONTRAST
TECHNIQUE: Multidetector CT imaging of the chest was performed using the
standard protocol during bolus administration of intravenous
contrast. Multiplanar CT image reconstructions and MIPs were
obtained to evaluate the vascular anatomy.
CONTRAST:  75mL OMNIPAQUE IOHEXOL 350 MG/ML SOLN

[Series 4: thins · axial · 0.65mm/px · z∈[-893,-637]mm · 15 of 282 slices shown]
[im 13/282  lung]
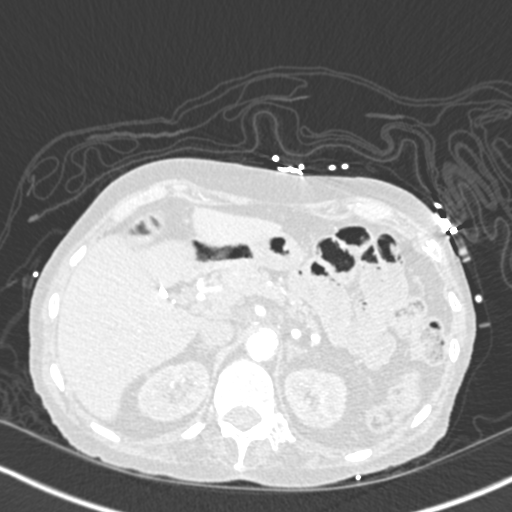
[im 37/282  soft-tissue]
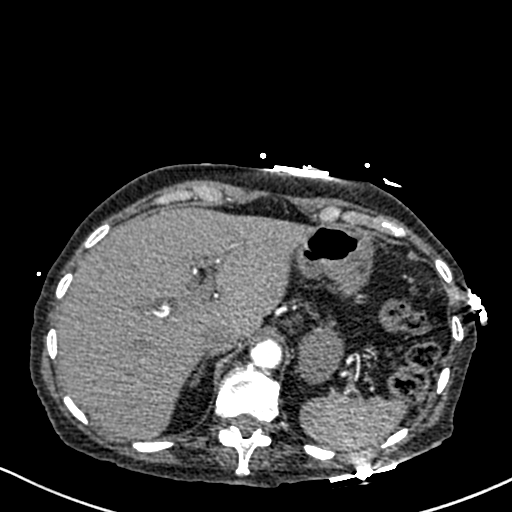
[im 49/282  lung]
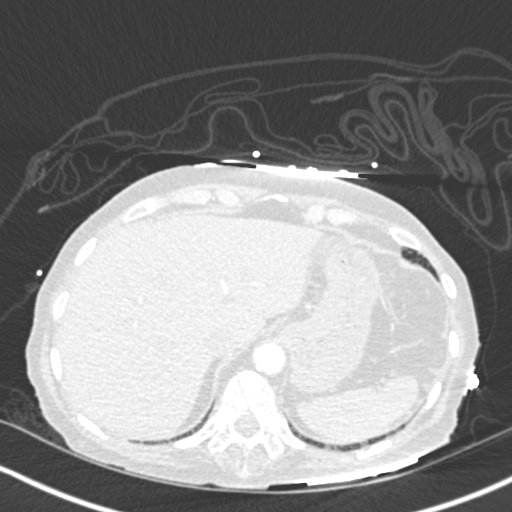
[im 74/282  soft-tissue]
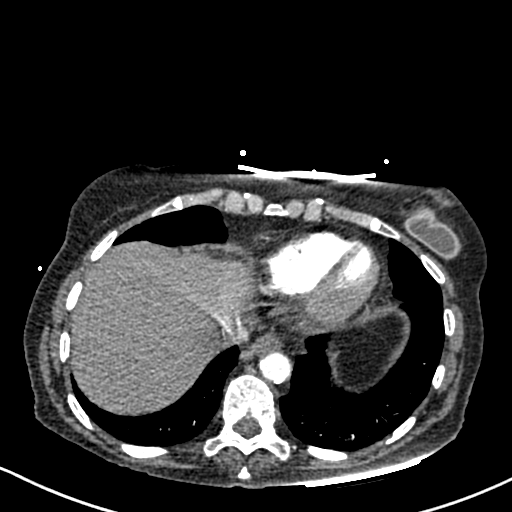
[im 86/282  lung]
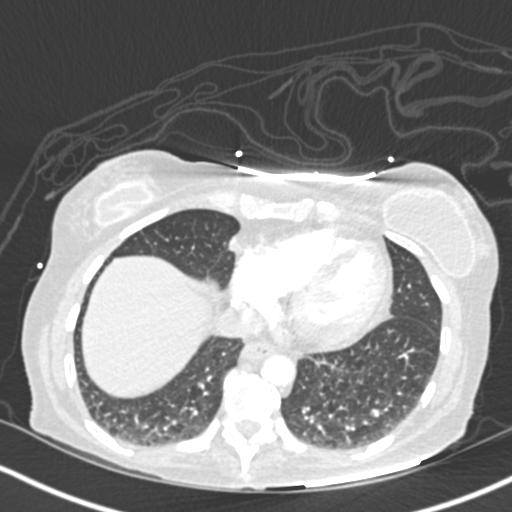
[im 110/282  soft-tissue]
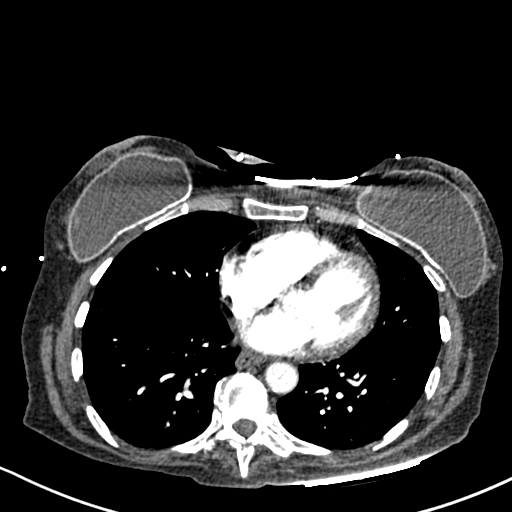
[im 123/282  lung]
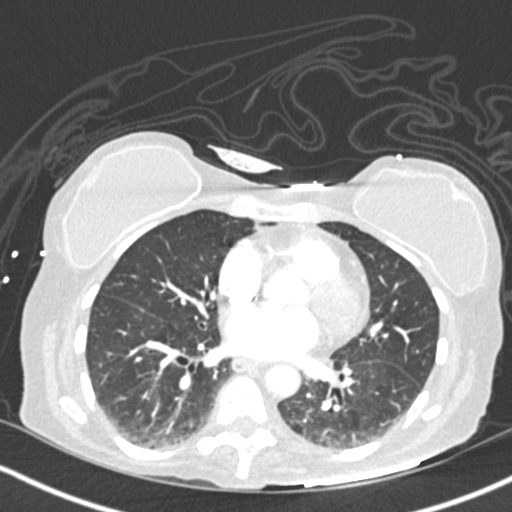
[im 147/282  soft-tissue]
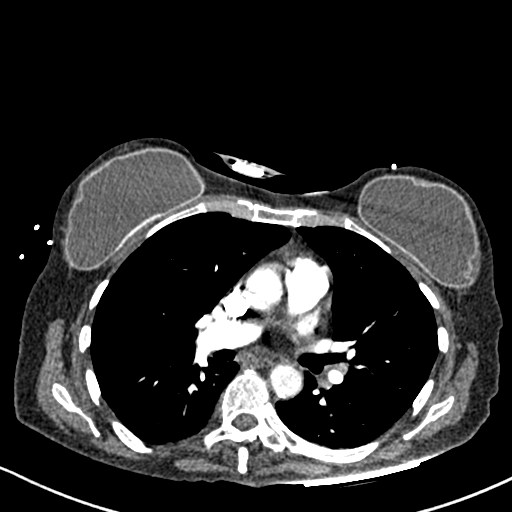
[im 159/282  lung]
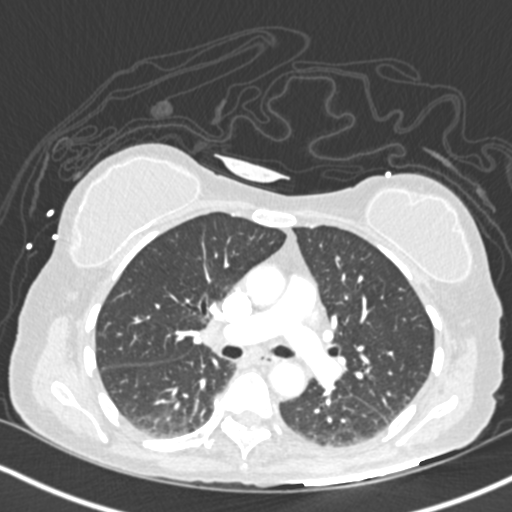
[im 172/282  soft-tissue]
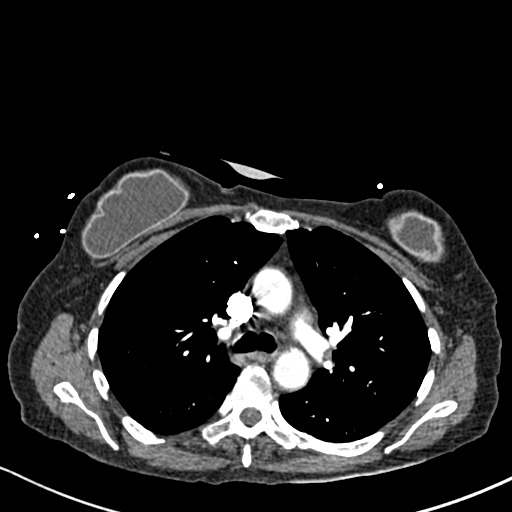
[im 196/282  lung]
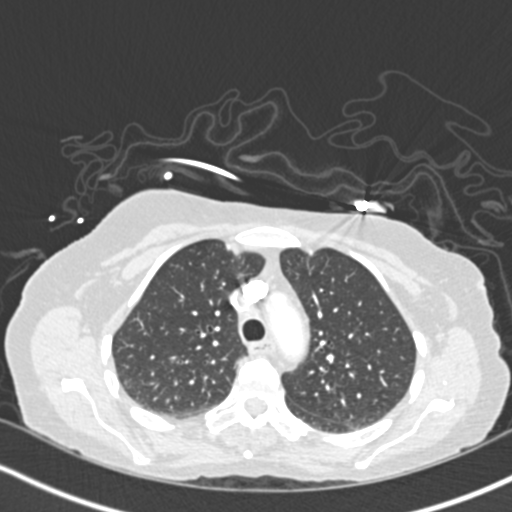
[im 208/282  soft-tissue]
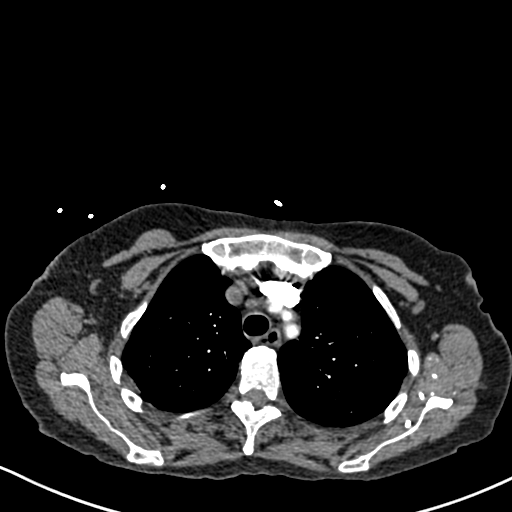
[im 233/282  lung]
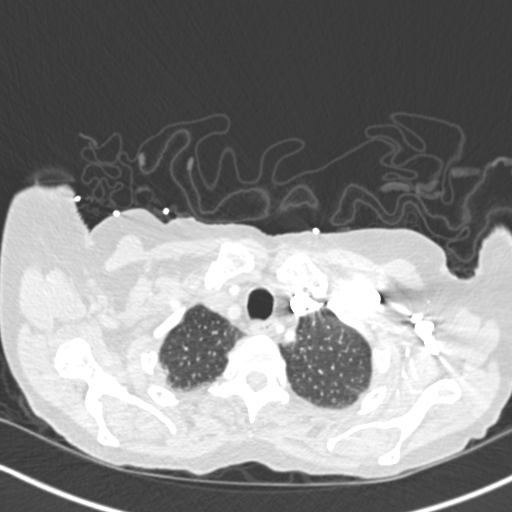
[im 245/282  soft-tissue]
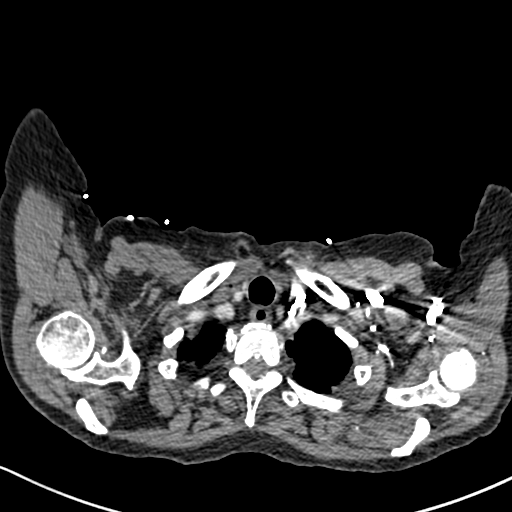
[im 269/282  lung]
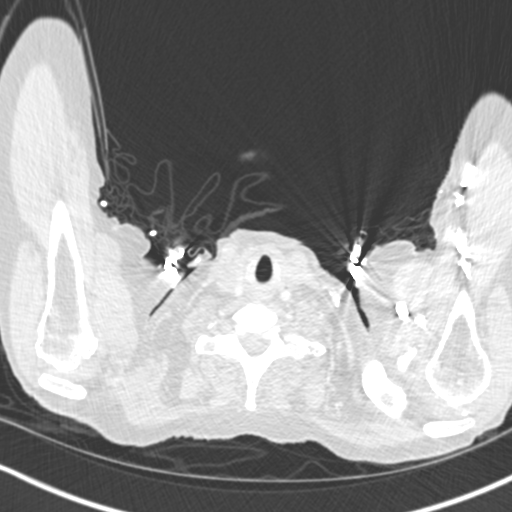

[Series 7: coronal mpr · coronal · 0.56mm/px · 3 of 106 slices shown]
[im 27/106  soft-tissue]
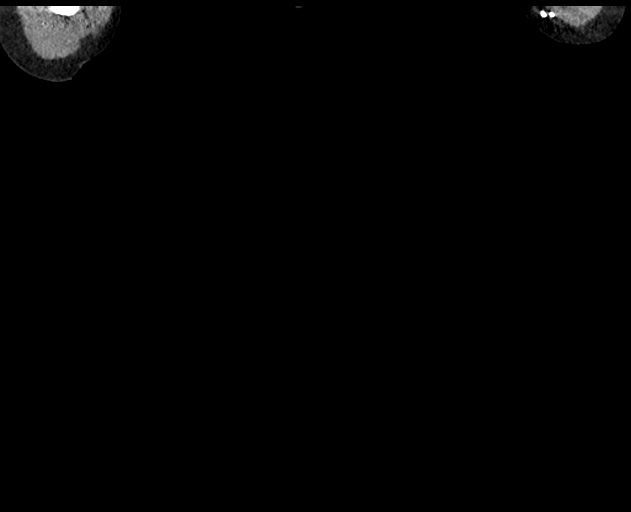
[im 53/106  soft-tissue]
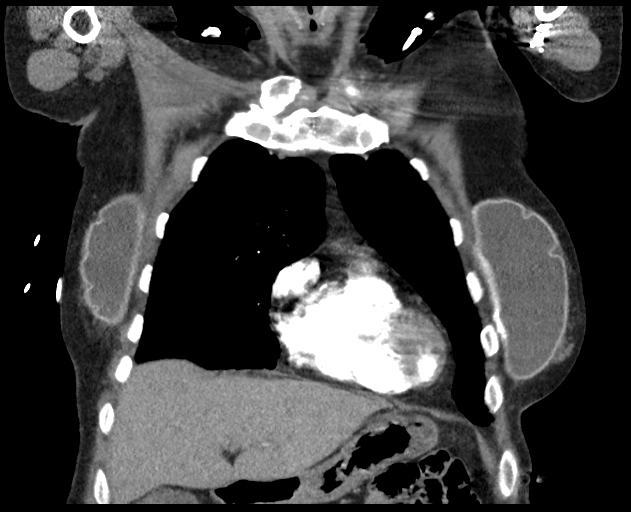
[im 79/106  soft-tissue]
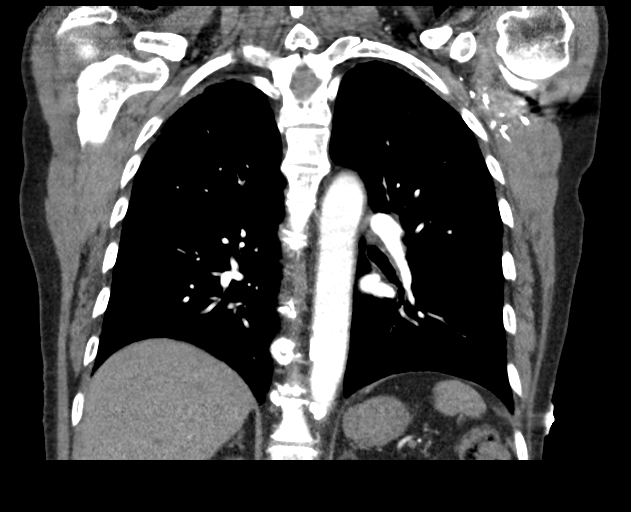

[18 of 46 positions shown; findings below may reference images not displayed]

FINDINGS: Cardiovascular: Thoracic aorta shows mild atherosclerotic change
without aneurysmal dilatation or dissection. No cardiac enlargement
is seen. Mild coronary calcifications are noted. The pulmonary
artery shows a normal branching pattern without intraluminal filling
defect to suggest pulmonary embolism.

Mediastinum/Nodes: Thoracic inlet is within normal limits. No
sizable hilar or mediastinal adenopathy is noted. The esophagus is
within normal limits.

Lungs/Pleura: The lungs are well aerated bilaterally. No focal
infiltrate or sizable effusion is noted. 5 mm nodule is noted in the
left upper lobe best seen on image number 16 of series 6. No other
sizable nodule is seen.

Upper Abdomen: Visualized upper abdomen is within normal limits.

Musculoskeletal: Bilateral breast implants are noted. No definitive
rupture is seen. The bony structures show no degenerative change of
the thoracic spine. No acute compression deformity is noted.

Review of the MIP images confirms the above findings.
IMPRESSION: No evidence of pulmonary emboli.

5 mm nodule in the left upper lobe as described. No follow-up needed
if patient is low-risk. Non-contrast chest CT can be considered in
12 months if patient is high-risk. This recommendation follows the
consensus statement: Guidelines for Management of Incidental
Pulmonary Nodules Detected on CT Images: From the [HOSPITAL]

No other focal abnormality is noted.

Aortic Atherosclerosis (733U6-GHZ.Z).

## 2020-09-26 IMAGING — CR DG CHEST 2V
1 series · 2 of 2 positions shown · non-contrast
Comparison: Radiographs dated 12/02/2018 and 02/05/2014

CLINICAL DATA: Chest pain.  Acute left shoulder and arm pain.

EXAM:
CHEST - 2 VIEW

[Series 1: dg chest 2 view · 0.14mm/px · 2 of 2 slices shown]
[im 1/2]
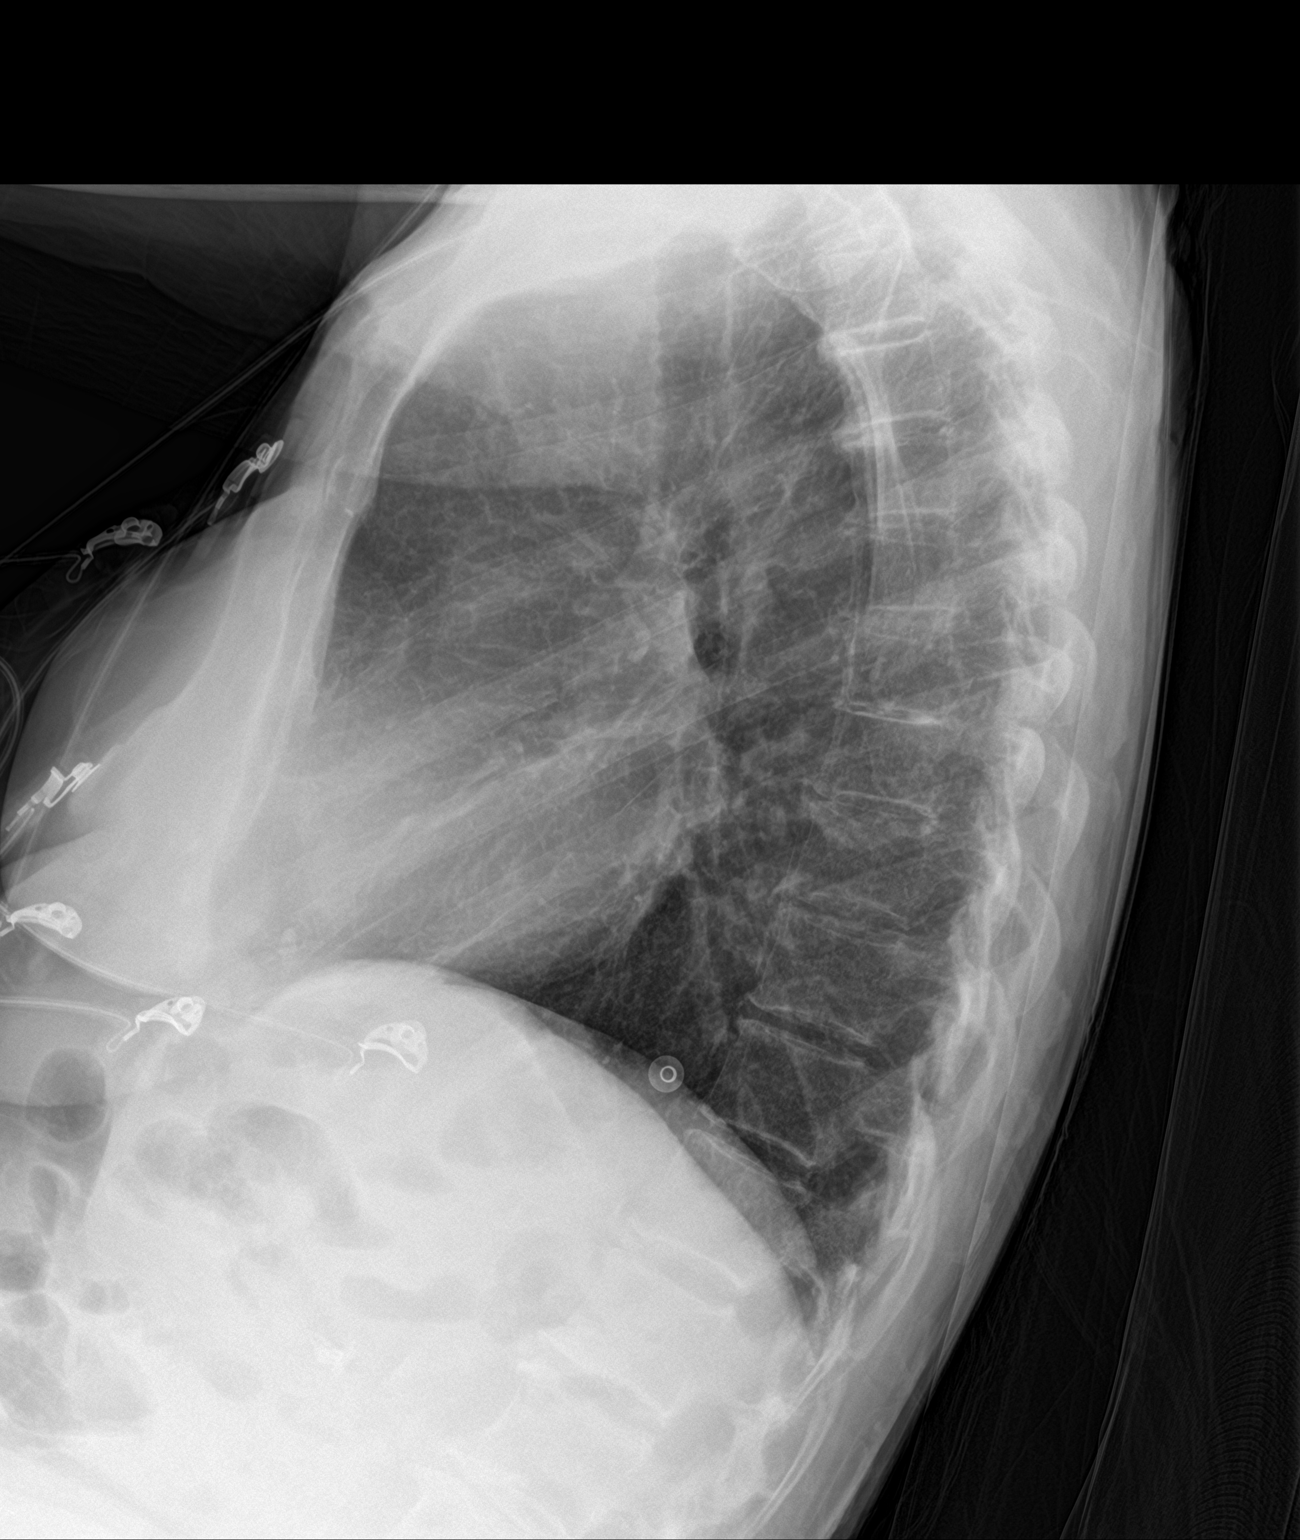
[im 2/2]
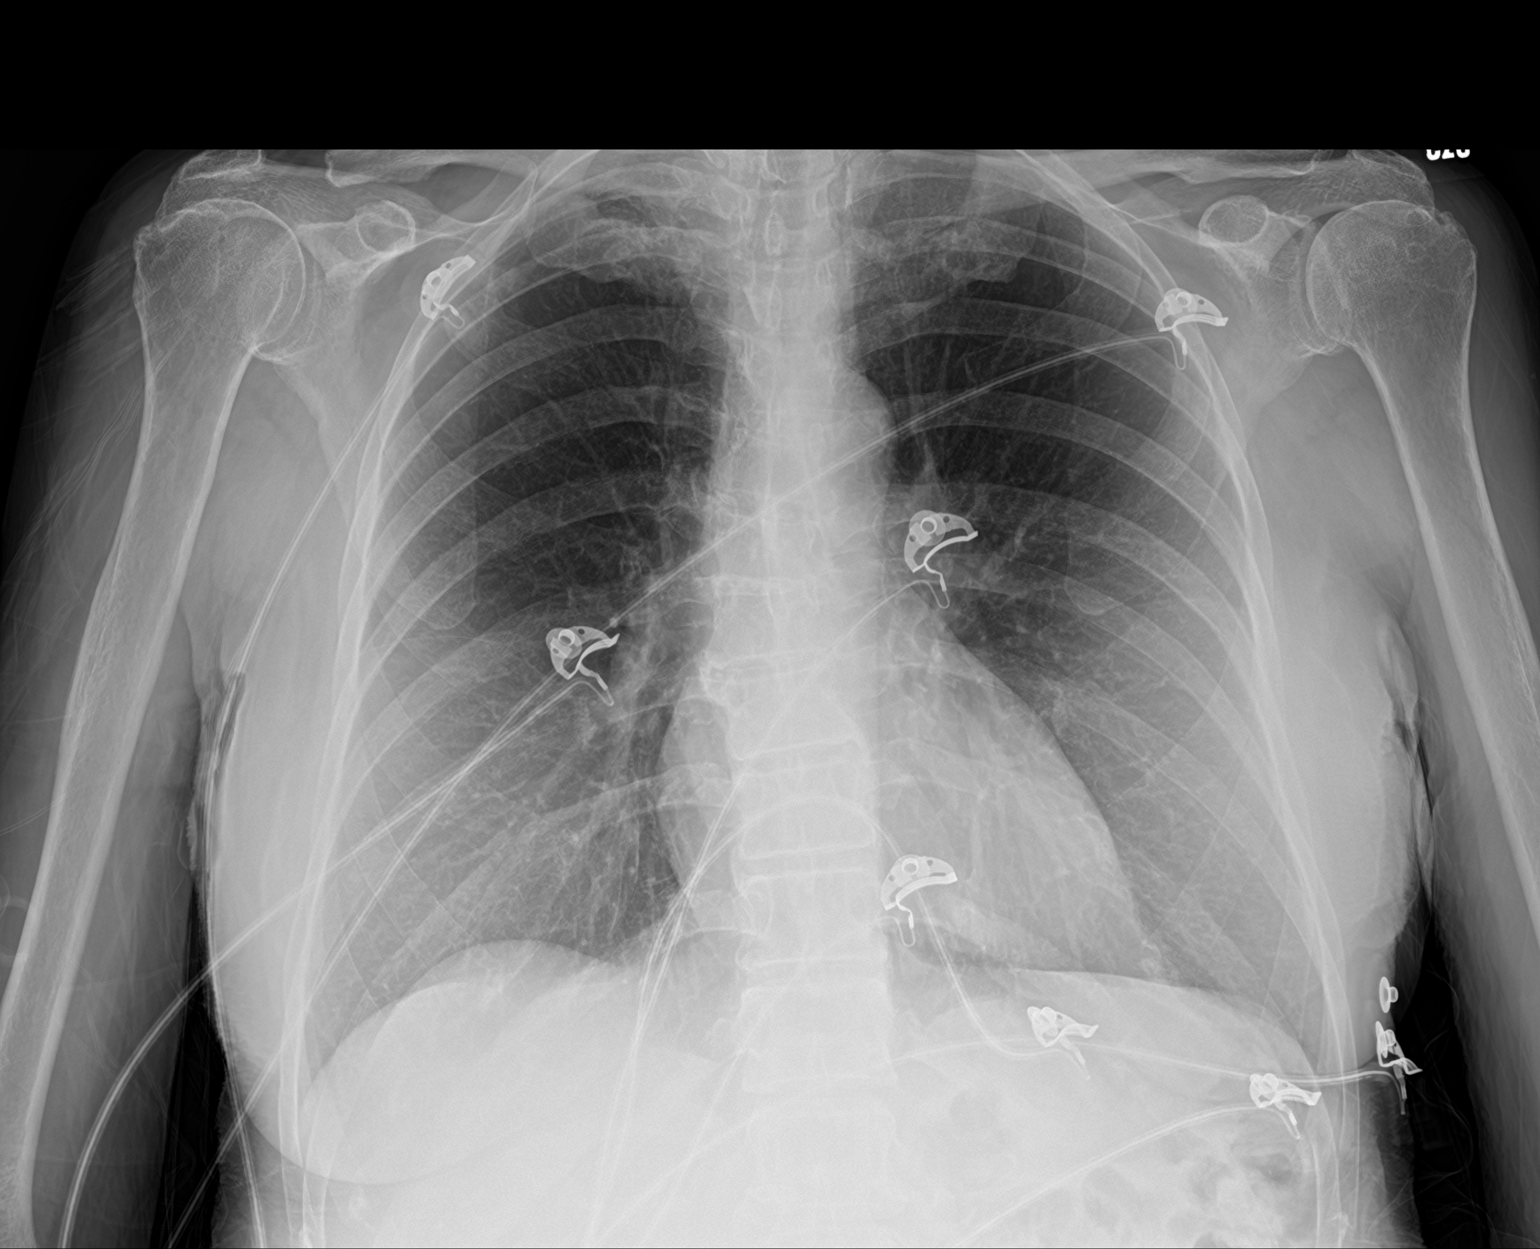

[2 of 2 positions shown; findings below may reference images not displayed]

FINDINGS: The heart size and mediastinal contours are within normal limits.
Both lungs are clear. No acute bone abnormality.
IMPRESSION: No active cardiopulmonary disease.

## 2020-09-26 NOTE — Telephone Encounter (Signed)
905 am.  Phone call made to patient's home to schedule a follow up appointment for this week.  Spoke with Candance-private caregiver who states patient will start outpatient PT today.  Patient is open for a visit on Friday at 11 am.

## 2020-09-26 NOTE — Telephone Encounter (Signed)
Ok thank you 

## 2020-09-27 IMAGING — US US EXTREM UP ARTERIAL SEG MULTIPLE*L*
1 series · 14 of 25 positions shown · non-contrast
Comparison: None.

CLINICAL DATA: 87-year-old with pain and swelling in the left wrist
and hand.

EXAM:
LEFT UPPER EXTREMITY ARTERIAL DUPLEX SCAN
TECHNIQUE: Gray-scale sonography as well as color Doppler and duplex ultrasound
was performed to evaluate the arteries of the upper extremity.

[Series 1: us extrem up arterial seg multiple*left* · 14 of 37 slices shown]
[im 1/37]
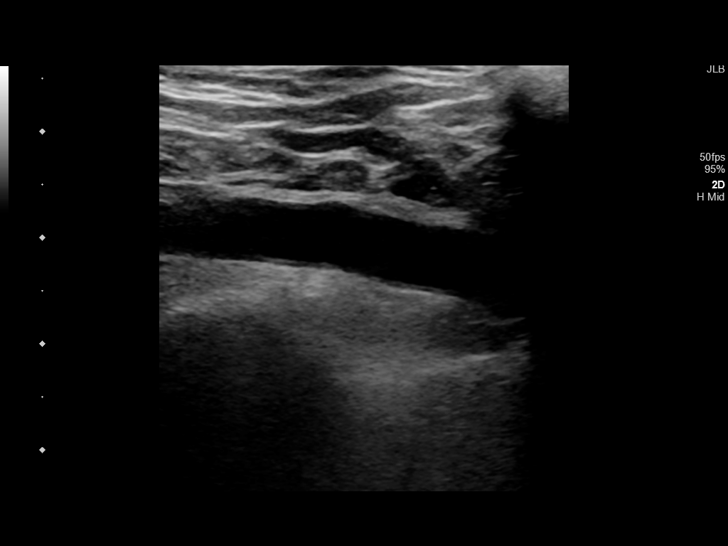
[im 4/37]
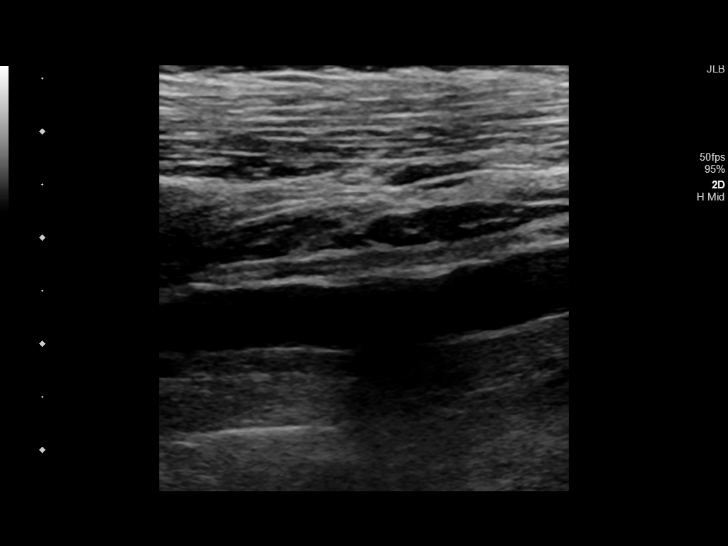
[im 7/37]
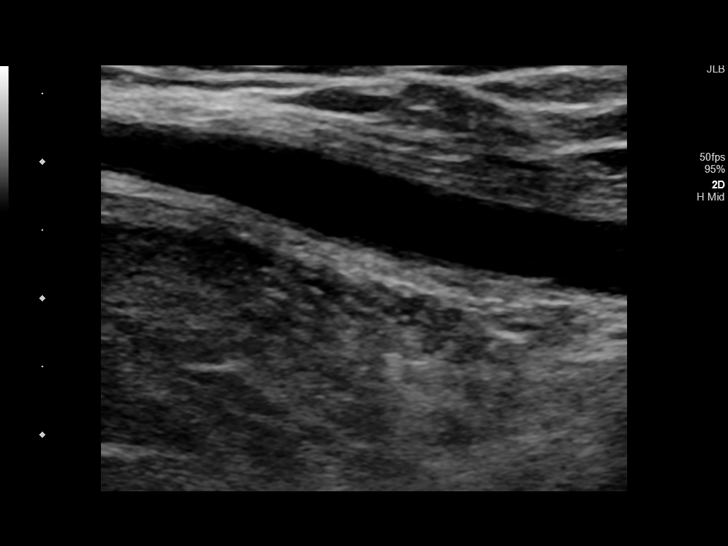
[im 10/37]
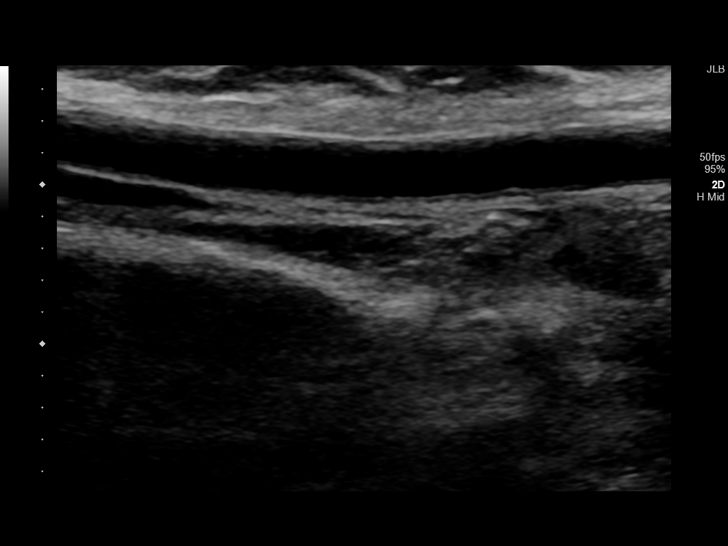
[im 13/37]
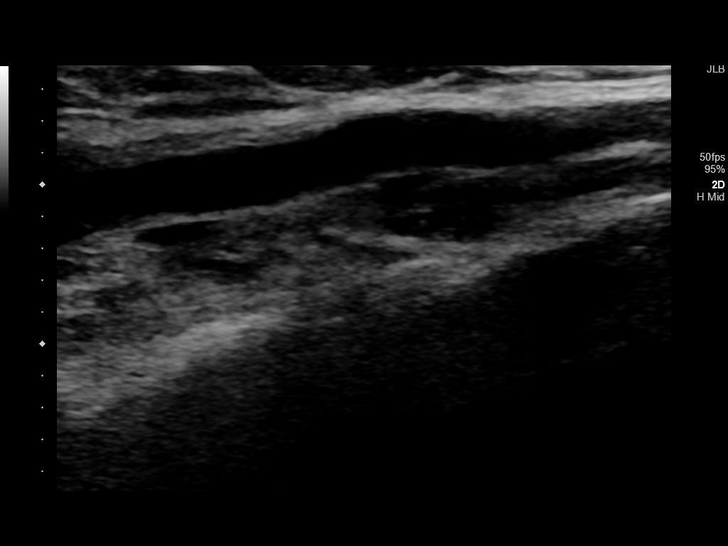
[im 14/37]
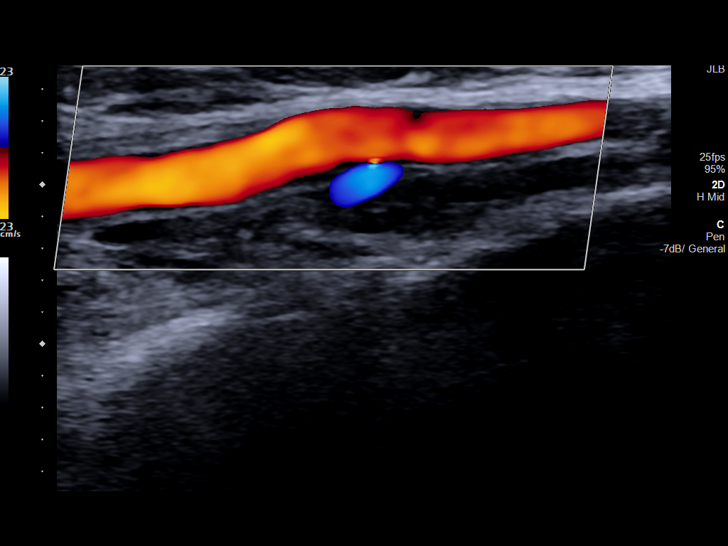
[im 17/37]
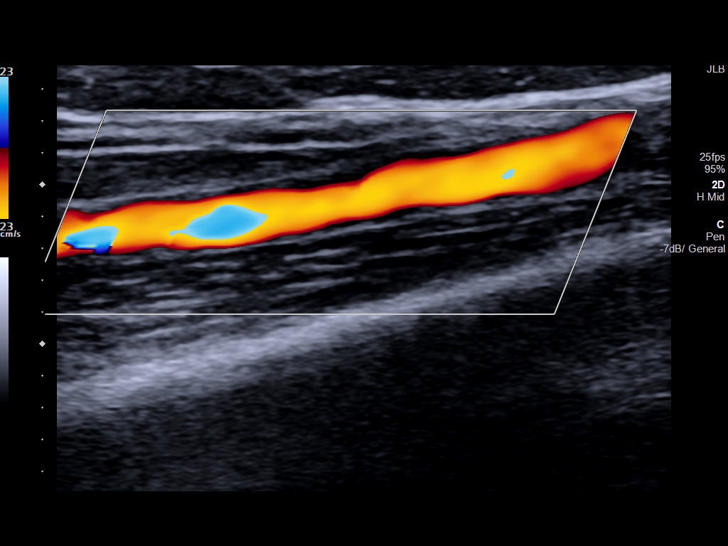
[im 20/37]
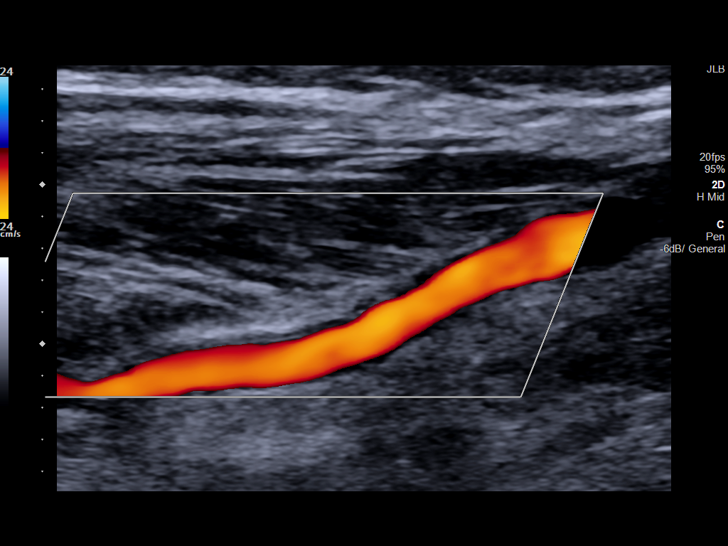
[im 23/37]
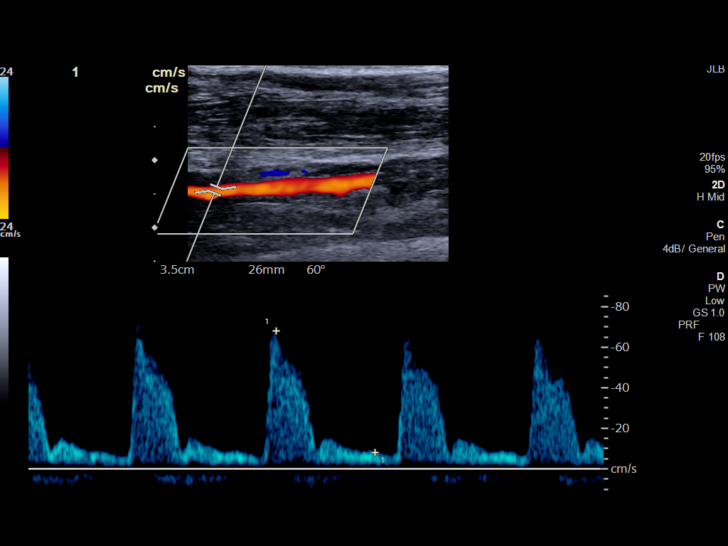
[im 25/37]
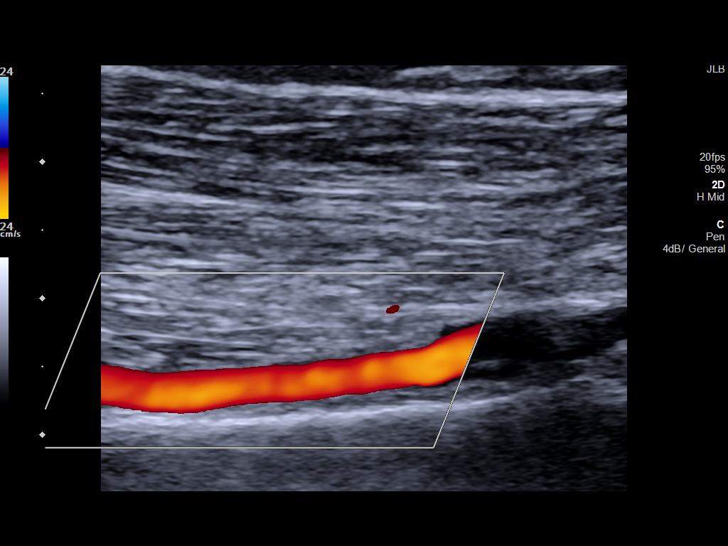
[im 28/37]
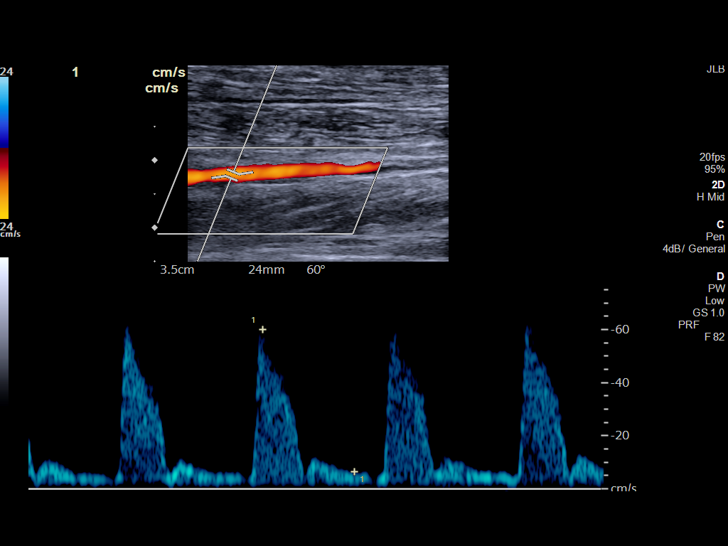
[im 31/37]
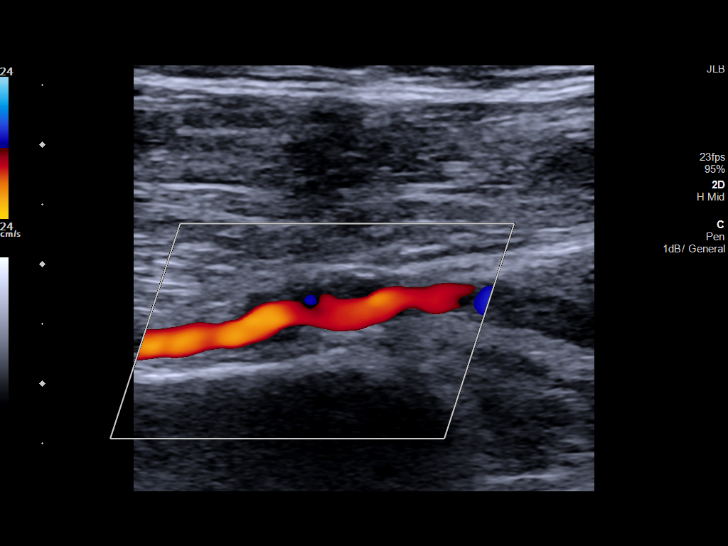
[im 34/37]
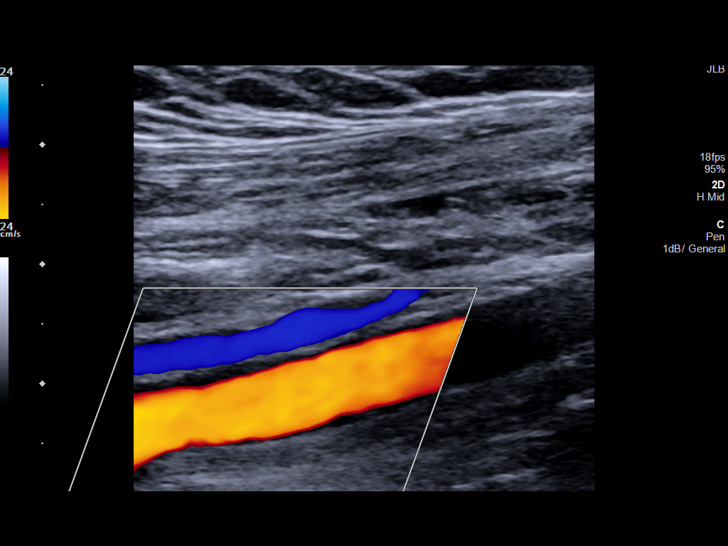
[im 37/37]
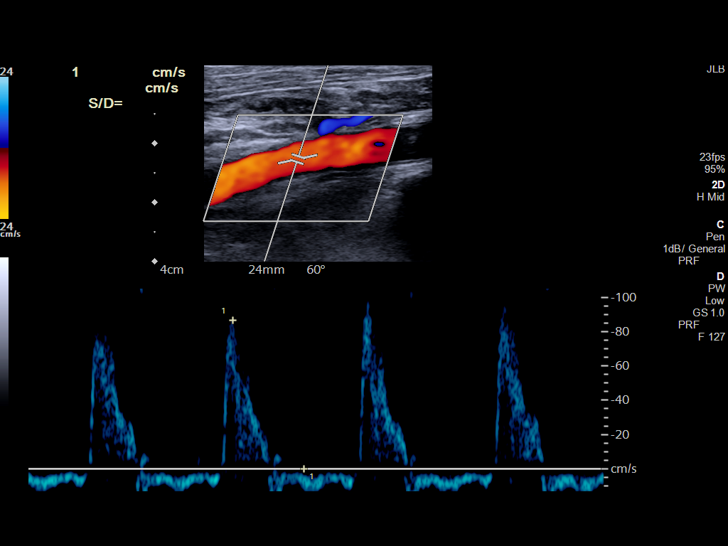

[14 of 25 positions shown; findings below may reference images not displayed]

FINDINGS: Left arm blood pressure is 157/69. Right arm blood pressure is

Left subclavian artery, left axillary artery, left brachial artery,
left radial artery and left ulnar arteries are patent. Normal
waveforms and velocities in left upper extremity arteries.

Right subclavian artery is patent with normal velocity and
waveforms.
IMPRESSION: Normal left upper extremity arterial duplex examination. Left upper
extremity arteries are patent without significant stenosis.

Symmetric arm blood pressures.

## 2020-09-28 ENCOUNTER — Other Ambulatory Visit: Payer: Self-pay

## 2020-09-28 ENCOUNTER — Other Ambulatory Visit: Payer: Medicare Other

## 2020-09-28 ENCOUNTER — Telehealth: Payer: Self-pay | Admitting: Internal Medicine

## 2020-09-28 VITALS — BP 132/60 | HR 68 | Resp 20 | Wt 111.0 lb

## 2020-09-28 DIAGNOSIS — Z515 Encounter for palliative care: Secondary | ICD-10-CM

## 2020-09-28 NOTE — Telephone Encounter (Signed)
Linton Rump called from [employer] stated that patient missed physical therapy today could not contact her.

## 2020-09-28 NOTE — Progress Notes (Signed)
PATIENT NAME: LESTA LIMBERT DOB: May 04, 1931 MRN: [id number]  PRIMARY CARE PROVIDER: Crecencio Mc, MD  RESPONSIBLE PARTY:  Acct ID - Guarantor Home Phone Work Phone Relationship Acct Type  [id number] APPOLONIA, [phone]  Self P/F     [address], [place], [place] [address]    PLAN OF CARE and INTERVENTIONS:               1.  GOALS OF CARE/ ADVANCE CARE PLANNING:  Patient desires to remain in her home with the assistance of private caregivers during the daytime hours.                2.  PATIENT/CAREGIVER EDUCATION:  Constipation vs Diarrhea and Palliative Care Services.              3.  DISEASE STATUS:  Greeted at the door by [employer].  Paitent is found coming out her bedroom with the roling walker.  She is engaging in coversation but noted forgetfulness.  Caregiver states patient will have and MRI on Sunday and follow up with Dr. Brigitte Pulse regarding dementia dx.  Patient reports no falls. She continues to use her rolling walker at all times.  She started PT this week and will be doing therapy 2x weekly.  Patient has reported issues with diarrhea over the weekend and the first part of this week.  Miralax and colace have been decreased to every other day.  This appears to be working better for patient.  Private caregiver is monitoring bowel status and will decrease further is needed.  Appetite is good and patient is eating 3 meals a day.  She reports limiting her sweets.  Patient has weighed herself and notes a 6 lb weight loss.   HISTORY OF PRESENT ILLNESS:  86 year old female with history COVID-19 and newly dx of Dementia.  Patient is being followed by Palliative Care monthly and PRN.  CODE STATUS: DNR  ADVANCED DIRECTIVES: N MOST FORM: No PPS: 60%   PHYSICAL EXAM:   VITALS:  BP 132/60 P 68 R 20 O2 sats 97% LUNGS:  CTA CARDIAC: HRR EXTREMITIES: No edema SKIN: Skin warm and dry. NEURO: Alert and oriented x 3.  Forgetfulness noted.       Lorenza Burton, RN

## 2020-09-28 NOTE — Telephone Encounter (Signed)
FYI

## 2020-09-30 ENCOUNTER — Other Ambulatory Visit: Payer: Self-pay

## 2020-09-30 ENCOUNTER — Ambulatory Visit
Admission: RE | Admit: 2020-09-30 | Discharge: 2020-09-30 | Disposition: A | Discharge status: Home / Self Care | Payer: Medicare Other | Source: Ambulatory Visit | Attending: Internal Medicine | Admitting: Internal Medicine

## 2020-09-30 DIAGNOSIS — R4182 Altered mental status, unspecified: Secondary | ICD-10-CM | POA: Diagnosis not present

## 2020-09-30 DIAGNOSIS — F0390 Unspecified dementia without behavioral disturbance: Secondary | ICD-10-CM | POA: Insufficient documentation

## 2020-09-30 DIAGNOSIS — R634 Abnormal weight loss: Secondary | ICD-10-CM | POA: Diagnosis not present

## 2020-09-30 DIAGNOSIS — I6782 Cerebral ischemia: Secondary | ICD-10-CM | POA: Diagnosis not present

## 2020-10-02 DIAGNOSIS — E43 Unspecified severe protein-calorie malnutrition: Secondary | ICD-10-CM | POA: Diagnosis not present

## 2020-10-02 DIAGNOSIS — H8111 Benign paroxysmal vertigo, right ear: Secondary | ICD-10-CM | POA: Diagnosis not present

## 2020-10-02 DIAGNOSIS — I10 Essential (primary) hypertension: Secondary | ICD-10-CM | POA: Diagnosis not present

## 2020-10-02 DIAGNOSIS — F320 Major depressive disorder, single episode, mild: Secondary | ICD-10-CM | POA: Diagnosis not present

## 2020-10-02 DIAGNOSIS — R531 Weakness: Secondary | ICD-10-CM | POA: Diagnosis not present

## 2020-10-02 DIAGNOSIS — F0390 Unspecified dementia without behavioral disturbance: Secondary | ICD-10-CM | POA: Diagnosis not present

## 2020-10-02 NOTE — Progress Notes (Signed)
Her brain MRI is normal.  So signs of prior strokes or masses.  Continue with plans to see Neurology for evaluation of memory issues

## 2020-10-04 ENCOUNTER — Telehealth: Payer: Self-pay | Admitting: Internal Medicine

## 2020-10-04 NOTE — Telephone Encounter (Signed)
Physical therapist Linton Rump from [facility] called to inform Dr. Derrel Nip that her patient missed her visit with him today. If you have any questions please notify the therapist.

## 2020-10-08 ENCOUNTER — Telehealth: Payer: Self-pay

## 2020-10-08 NOTE — Telephone Encounter (Signed)
Spoke with pt's daughter to let her know of the pt's appt with Neurology. Daughter gave a verbal understanding.

## 2020-10-08 NOTE — Telephone Encounter (Signed)
Neurology referral was placed on 09/13/2020. Neither of the pt's daughter's have been contacted in regards to this.

## 2020-10-08 NOTE — Telephone Encounter (Signed)
Pt is scheduled on 11/13/2020 at 2:30 pm.

## 2020-10-09 DIAGNOSIS — H8111 Benign paroxysmal vertigo, right ear: Secondary | ICD-10-CM | POA: Diagnosis not present

## 2020-10-09 DIAGNOSIS — R531 Weakness: Secondary | ICD-10-CM | POA: Diagnosis not present

## 2020-10-09 DIAGNOSIS — I10 Essential (primary) hypertension: Secondary | ICD-10-CM | POA: Diagnosis not present

## 2020-10-09 DIAGNOSIS — E43 Unspecified severe protein-calorie malnutrition: Secondary | ICD-10-CM | POA: Diagnosis not present

## 2020-10-09 DIAGNOSIS — F0390 Unspecified dementia without behavioral disturbance: Secondary | ICD-10-CM | POA: Diagnosis not present

## 2020-10-09 DIAGNOSIS — F320 Major depressive disorder, single episode, mild: Secondary | ICD-10-CM | POA: Diagnosis not present

## 2020-10-15 DIAGNOSIS — F320 Major depressive disorder, single episode, mild: Secondary | ICD-10-CM | POA: Diagnosis not present

## 2020-10-15 DIAGNOSIS — H8111 Benign paroxysmal vertigo, right ear: Secondary | ICD-10-CM | POA: Diagnosis not present

## 2020-10-15 DIAGNOSIS — F0390 Unspecified dementia without behavioral disturbance: Secondary | ICD-10-CM | POA: Diagnosis not present

## 2020-10-15 DIAGNOSIS — R531 Weakness: Secondary | ICD-10-CM | POA: Diagnosis not present

## 2020-10-15 DIAGNOSIS — I10 Essential (primary) hypertension: Secondary | ICD-10-CM | POA: Diagnosis not present

## 2020-10-15 DIAGNOSIS — E43 Unspecified severe protein-calorie malnutrition: Secondary | ICD-10-CM | POA: Diagnosis not present

## 2020-10-16 DIAGNOSIS — R42 Dizziness and giddiness: Secondary | ICD-10-CM | POA: Diagnosis not present

## 2020-10-17 DIAGNOSIS — D225 Melanocytic nevi of trunk: Secondary | ICD-10-CM | POA: Diagnosis not present

## 2020-10-17 DIAGNOSIS — D2262 Melanocytic nevi of left upper limb, including shoulder: Secondary | ICD-10-CM | POA: Diagnosis not present

## 2020-10-17 DIAGNOSIS — D2261 Melanocytic nevi of right upper limb, including shoulder: Secondary | ICD-10-CM | POA: Diagnosis not present

## 2020-10-17 DIAGNOSIS — L538 Other specified erythematous conditions: Secondary | ICD-10-CM | POA: Diagnosis not present

## 2020-10-17 DIAGNOSIS — Z85828 Personal history of other malignant neoplasm of skin: Secondary | ICD-10-CM | POA: Diagnosis not present

## 2020-10-17 DIAGNOSIS — L820 Inflamed seborrheic keratosis: Secondary | ICD-10-CM | POA: Diagnosis not present

## 2020-10-17 DIAGNOSIS — L821 Other seborrheic keratosis: Secondary | ICD-10-CM | POA: Diagnosis not present

## 2020-10-18 DIAGNOSIS — E785 Hyperlipidemia, unspecified: Secondary | ICD-10-CM | POA: Diagnosis not present

## 2020-10-18 DIAGNOSIS — F0390 Unspecified dementia without behavioral disturbance: Secondary | ICD-10-CM | POA: Diagnosis not present

## 2020-10-18 DIAGNOSIS — H8111 Benign paroxysmal vertigo, right ear: Secondary | ICD-10-CM | POA: Diagnosis not present

## 2020-10-18 DIAGNOSIS — F320 Major depressive disorder, single episode, mild: Secondary | ICD-10-CM | POA: Diagnosis not present

## 2020-10-18 DIAGNOSIS — I10 Essential (primary) hypertension: Secondary | ICD-10-CM | POA: Diagnosis not present

## 2020-10-18 DIAGNOSIS — R531 Weakness: Secondary | ICD-10-CM | POA: Diagnosis not present

## 2020-10-18 DIAGNOSIS — E43 Unspecified severe protein-calorie malnutrition: Secondary | ICD-10-CM | POA: Diagnosis not present

## 2020-10-18 DIAGNOSIS — M1711 Unilateral primary osteoarthritis, right knee: Secondary | ICD-10-CM | POA: Diagnosis not present

## 2020-10-18 DIAGNOSIS — R911 Solitary pulmonary nodule: Secondary | ICD-10-CM | POA: Diagnosis not present

## 2020-10-23 DIAGNOSIS — H8111 Benign paroxysmal vertigo, right ear: Secondary | ICD-10-CM | POA: Diagnosis not present

## 2020-10-23 DIAGNOSIS — F320 Major depressive disorder, single episode, mild: Secondary | ICD-10-CM | POA: Diagnosis not present

## 2020-10-23 DIAGNOSIS — R531 Weakness: Secondary | ICD-10-CM | POA: Diagnosis not present

## 2020-10-23 DIAGNOSIS — I10 Essential (primary) hypertension: Secondary | ICD-10-CM | POA: Diagnosis not present

## 2020-10-23 DIAGNOSIS — E43 Unspecified severe protein-calorie malnutrition: Secondary | ICD-10-CM | POA: Diagnosis not present

## 2020-10-23 DIAGNOSIS — F0390 Unspecified dementia without behavioral disturbance: Secondary | ICD-10-CM | POA: Diagnosis not present

## 2020-10-24 ENCOUNTER — Telehealth: Payer: Self-pay

## 2020-10-24 NOTE — Telephone Encounter (Signed)
921 am.  Telephonic visit completed with patient.  Patient is going out with her Charity fundraiser.  She states she is not going into places just out riding and picking up food curbside.  Patient reports her appetite continues to improve.  She is starting to get her taste back for sweet food.   She is weighing herself daily.  Current weight is 114 lbs.  Patient was being followed by PT on my last visit but that was completed.  Patient requested additional PT due to her overall improvement.  PT was ordered again and came to the home yesterday.  Patient is uncertain how long therapy will continue but they are seeing her 1x weekly.  Patient denies any issues with insomnia.   Bowel movements continue to be regular.  No falls are reported and no issues with pain.  Patient completed her MRI scan to follow up on suspected dementia.  Patient will be seeing Dr. Brigitte Pulse to follow up on the results.  Patient denies any needs at this time.  She continues to have a Charity fundraiser from 8-3 daily.  I have advised patient to contact PC should any changes occur and patient agrees.  PLAN:  PC will outreach patient again next month for follow up.

## 2020-10-29 ENCOUNTER — Telehealth: Payer: Self-pay | Admitting: *Deleted

## 2020-10-29 DIAGNOSIS — E039 Hypothyroidism, unspecified: Secondary | ICD-10-CM

## 2020-10-29 NOTE — Telephone Encounter (Signed)
Please place future orders for lab appt.  

## 2020-10-30 ENCOUNTER — Other Ambulatory Visit (INDEPENDENT_AMBULATORY_CARE_PROVIDER_SITE_OTHER): Payer: Medicare Other

## 2020-10-30 ENCOUNTER — Other Ambulatory Visit: Payer: Self-pay

## 2020-10-30 DIAGNOSIS — E039 Hypothyroidism, unspecified: Secondary | ICD-10-CM | POA: Diagnosis not present

## 2020-10-30 DIAGNOSIS — H8111 Benign paroxysmal vertigo, right ear: Secondary | ICD-10-CM | POA: Diagnosis not present

## 2020-10-30 DIAGNOSIS — F0390 Unspecified dementia without behavioral disturbance: Secondary | ICD-10-CM | POA: Diagnosis not present

## 2020-10-30 DIAGNOSIS — R531 Weakness: Secondary | ICD-10-CM | POA: Diagnosis not present

## 2020-10-30 DIAGNOSIS — F320 Major depressive disorder, single episode, mild: Secondary | ICD-10-CM | POA: Diagnosis not present

## 2020-10-30 DIAGNOSIS — I10 Essential (primary) hypertension: Secondary | ICD-10-CM | POA: Diagnosis not present

## 2020-10-30 DIAGNOSIS — E43 Unspecified severe protein-calorie malnutrition: Secondary | ICD-10-CM | POA: Diagnosis not present

## 2020-10-31 LAB — THYROID PANEL WITH TSH
Free Thyroxine Index: 1.4 (ref 1.4–3.8)
T3 Uptake: 27 % (ref 22–35)
T4, Total: 5.1 ug/dL (ref 5.1–11.9)
TSH: 3.22 mIU/L (ref 0.40–4.50)

## 2020-10-31 NOTE — Progress Notes (Signed)
Thyroid function is normal on current dose.  No current changes needed.

## 2020-11-06 DIAGNOSIS — I10 Essential (primary) hypertension: Secondary | ICD-10-CM | POA: Diagnosis not present

## 2020-11-06 DIAGNOSIS — R531 Weakness: Secondary | ICD-10-CM | POA: Diagnosis not present

## 2020-11-06 DIAGNOSIS — F320 Major depressive disorder, single episode, mild: Secondary | ICD-10-CM | POA: Diagnosis not present

## 2020-11-06 DIAGNOSIS — H8111 Benign paroxysmal vertigo, right ear: Secondary | ICD-10-CM | POA: Diagnosis not present

## 2020-11-06 DIAGNOSIS — F0390 Unspecified dementia without behavioral disturbance: Secondary | ICD-10-CM | POA: Diagnosis not present

## 2020-11-06 DIAGNOSIS — E43 Unspecified severe protein-calorie malnutrition: Secondary | ICD-10-CM | POA: Diagnosis not present

## 2020-11-17 ENCOUNTER — Other Ambulatory Visit: Payer: Self-pay | Admitting: Internal Medicine

## 2020-11-17 DIAGNOSIS — G8929 Other chronic pain: Secondary | ICD-10-CM

## 2020-11-17 DIAGNOSIS — R293 Abnormal posture: Secondary | ICD-10-CM

## 2020-11-28 ENCOUNTER — Telehealth: Payer: Self-pay

## 2020-11-28 NOTE — Telephone Encounter (Signed)
Open on error

## 2020-12-03 ENCOUNTER — Telehealth: Payer: Self-pay

## 2020-12-03 DIAGNOSIS — Z7189 Other specified counseling: Secondary | ICD-10-CM | POA: Diagnosis not present

## 2020-12-03 DIAGNOSIS — F5104 Psychophysiologic insomnia: Secondary | ICD-10-CM | POA: Diagnosis not present

## 2020-12-03 DIAGNOSIS — F0150 Vascular dementia without behavioral disturbance: Secondary | ICD-10-CM | POA: Diagnosis not present

## 2020-12-03 DIAGNOSIS — E538 Deficiency of other specified B group vitamins: Secondary | ICD-10-CM | POA: Diagnosis not present

## 2020-12-03 DIAGNOSIS — E519 Thiamine deficiency, unspecified: Secondary | ICD-10-CM | POA: Diagnosis not present

## 2020-12-03 DIAGNOSIS — G309 Alzheimer's disease, unspecified: Secondary | ICD-10-CM | POA: Diagnosis not present

## 2020-12-03 DIAGNOSIS — F0280 Dementia in other diseases classified elsewhere without behavioral disturbance: Secondary | ICD-10-CM | POA: Diagnosis not present

## 2020-12-03 NOTE — Telephone Encounter (Signed)
12 pm.  Message from Dudley requesting a call back.  Return call made to Emily Wu and she is inquiring about Palliative Care Services.  She was uncertain if patient was still being followed by our team.  I have reviewed patient's chart and advised patient had an in-person visit on 09/28/20 and a telephone check in on 10/24/20.  Advised that patient was doing very well when contact was made in December and did not require an in-person visit.  Patient is still being followed by Palliative Care and will be seen this month.  Emily Wu states she wanted to make sure we were still following patient because they have seen some cognitive decline.  They will be seeing Dr. Brigitte Pulse today for a follow-up.  Emily Wu state short-term memory is worsening and there has been some financial concerns.  Patient has a Charity fundraiser Mon-Fri from 8-3, no sitters on Saturday and a sitter on Sunday from 8-3.  I offered to schedule a visit for next week but patient declines at this time.  She has PT coming in once a week and would like to see what day they are coming next week.  Once she has this information she will schedule with Palliative Care.  They are requesting a call back next week to schedule the appt.  Advised that our team would reach out to patient next week to schedule and in-person visit.

## 2020-12-05 DIAGNOSIS — F32A Depression, unspecified: Secondary | ICD-10-CM | POA: Diagnosis not present

## 2020-12-05 DIAGNOSIS — R293 Abnormal posture: Secondary | ICD-10-CM | POA: Diagnosis not present

## 2020-12-05 DIAGNOSIS — R911 Solitary pulmonary nodule: Secondary | ICD-10-CM | POA: Diagnosis not present

## 2020-12-05 DIAGNOSIS — Z87891 Personal history of nicotine dependence: Secondary | ICD-10-CM | POA: Diagnosis not present

## 2020-12-05 DIAGNOSIS — E43 Unspecified severe protein-calorie malnutrition: Secondary | ICD-10-CM | POA: Diagnosis not present

## 2020-12-05 DIAGNOSIS — F0390 Unspecified dementia without behavioral disturbance: Secondary | ICD-10-CM | POA: Diagnosis not present

## 2020-12-05 DIAGNOSIS — Z8616 Personal history of COVID-19: Secondary | ICD-10-CM | POA: Diagnosis not present

## 2020-12-05 DIAGNOSIS — M6281 Muscle weakness (generalized): Secondary | ICD-10-CM | POA: Diagnosis not present

## 2020-12-05 DIAGNOSIS — H8111 Benign paroxysmal vertigo, right ear: Secondary | ICD-10-CM | POA: Diagnosis not present

## 2020-12-07 DIAGNOSIS — E43 Unspecified severe protein-calorie malnutrition: Secondary | ICD-10-CM | POA: Diagnosis not present

## 2020-12-07 DIAGNOSIS — F0390 Unspecified dementia without behavioral disturbance: Secondary | ICD-10-CM | POA: Diagnosis not present

## 2020-12-07 DIAGNOSIS — R911 Solitary pulmonary nodule: Secondary | ICD-10-CM | POA: Diagnosis not present

## 2020-12-07 DIAGNOSIS — H8111 Benign paroxysmal vertigo, right ear: Secondary | ICD-10-CM | POA: Diagnosis not present

## 2020-12-07 DIAGNOSIS — M6281 Muscle weakness (generalized): Secondary | ICD-10-CM | POA: Diagnosis not present

## 2020-12-07 DIAGNOSIS — F32A Depression, unspecified: Secondary | ICD-10-CM | POA: Diagnosis not present

## 2020-12-11 ENCOUNTER — Telehealth: Payer: Self-pay | Admitting: Internal Medicine

## 2020-12-11 DIAGNOSIS — Z8616 Personal history of COVID-19: Secondary | ICD-10-CM | POA: Diagnosis not present

## 2020-12-11 DIAGNOSIS — F0390 Unspecified dementia without behavioral disturbance: Secondary | ICD-10-CM | POA: Diagnosis not present

## 2020-12-11 DIAGNOSIS — M6281 Muscle weakness (generalized): Secondary | ICD-10-CM | POA: Diagnosis not present

## 2020-12-11 DIAGNOSIS — Z87891 Personal history of nicotine dependence: Secondary | ICD-10-CM | POA: Diagnosis not present

## 2020-12-11 DIAGNOSIS — F32A Depression, unspecified: Secondary | ICD-10-CM | POA: Diagnosis not present

## 2020-12-11 DIAGNOSIS — E43 Unspecified severe protein-calorie malnutrition: Secondary | ICD-10-CM | POA: Diagnosis not present

## 2020-12-11 DIAGNOSIS — R293 Abnormal posture: Secondary | ICD-10-CM | POA: Diagnosis not present

## 2020-12-11 DIAGNOSIS — H8111 Benign paroxysmal vertigo, right ear: Secondary | ICD-10-CM | POA: Diagnosis not present

## 2020-12-11 DIAGNOSIS — R911 Solitary pulmonary nodule: Secondary | ICD-10-CM | POA: Diagnosis not present

## 2020-12-11 DIAGNOSIS — R109 Unspecified abdominal pain: Secondary | ICD-10-CM

## 2020-12-11 NOTE — Telephone Encounter (Signed)
I will not prescribe narcotics without knowing what I am treating,  So please schedule her a lab visit for a ua and culture. ORDERED

## 2020-12-11 NOTE — Telephone Encounter (Signed)
Spoken to patient, she stated Sx started last Friday in the afternoon with sharp flank pain px . Sunday at 1500 she felt she had passed a kidney stone, since then she hasn't had any px and but she is very sore.No blood in urine. Hx of kidney stones. Taking tylenol for patient sx. If she is having further pain or thinks she maybe passing another stone, she would like something for the pain PRN.

## 2020-12-11 NOTE — Telephone Encounter (Signed)
Will you triage please 

## 2020-12-11 NOTE — Telephone Encounter (Signed)
Please see previous message.    [place] Night - Cl TELEPHONE ADVICE RECORD [facility] Patient Name: Emily Wu Gender: Female DOB: 1930/12/31 Age: 87 Y 32 M 25 D Return Phone Number: [phone] (Primary) Address: City/State/Zip: [place] [address] Client [facility] [place] Night - Cl Client Site [place] Physician Deborra Medina - MD Contact Type Call Who Is Calling Patient / Member / Family / Caregiver Call Type Triage / Clinical Relationship To Patient Self Return Phone Number [phone] (Primary) Chief Complaint Flank Pain Reason for Call Symptomatic / Request for [place] states that she has left side groin pain all the way up her side. Translation No Nurse Assessment Nurse: Lucky Cowboy, RN, Levada Dy Date/Time (Eastern Time): 12/11/2020 12:10:23 PM Confirm and document reason for call. If symptomatic, describe symptoms. ---Caller stated that she has left groin pain that goes in to the left flank. It started on Friday. Not sure if blood in urine or not. No fever or vomiting. Does the patient have any new or worsening symptoms? ---Yes Will a triage be completed? ---Yes Related visit to physician within the last 2 weeks? ---No Does the PT have any chronic conditions? (i.e. diabetes, asthma, this includes High risk factors for pregnancy, etc.) ---Yes List chronic conditions. ---thyroid, insomnia, dementia Is this a behavioral health or substance abuse call? ---No Guidelines Guideline Title Affirmed Question Affirmed Notes Nurse Date/Time (Eastern Time) Flank Pain MODERATE pain (e.g., interferes with normal activities or awakens from sleep) [place], RN, Levada Dy 12/11/2020 12:12:55 PM Disp. Time Eilene Ghazi Time) Disposition Final User 12/11/2020 12:14:53 PM See PCP within 24 Hours Yes Dew, RN, Marin Shutter Disagree/Comply Comply PLEASE NOTE: All  timestamps contained within this report are represented as [place] Standard Time. CONFIDENTIALTY NOTICE: This fax transmission is intended only for the addressee. It contains information that is legally privileged, confidential or otherwise protected from use or disclosure. If you are not the intended recipient, you are strictly prohibited from reviewing, disclosing, copying using or disseminating any of this information or taking any action in reliance on or regarding this information. If you have received this fax in error, please notify us immediately by telephone so that we can arrange for its return to [place]. Phone: [phone], Toll-Free: [phone], Fax: [phone] Page: 2 of 2 Call Id: [id number] [place] Understands Yes PreDisposition Home Care Care Advice Given Per Guideline SEE PCP WITHIN 24 HOURS: * IF OFFICE WILL BE OPEN: You need to be examined within the next 24 hours. Call your doctor (or NP/PA) when the office opens and make an appointment. PAIN MEDICINES: * For pain relief, you can take either acetaminophen, ibuprofen, or naproxen. * They are over-the-counter (OTC) pain drugs. You can buy them at the drugstore. CALL BACK IF: * Fever over 100.4 F (38.0 C) * You become worse CARE ADVICE given per Flank Pain (Adult) guideline. Referrals REFERRED TO PCP OFFICE

## 2020-12-11 NOTE — Telephone Encounter (Signed)
Patient called in stated that she may a kidney stone

## 2020-12-12 ENCOUNTER — Other Ambulatory Visit (INDEPENDENT_AMBULATORY_CARE_PROVIDER_SITE_OTHER): Payer: Medicare Other

## 2020-12-12 ENCOUNTER — Other Ambulatory Visit: Payer: Self-pay

## 2020-12-12 DIAGNOSIS — R109 Unspecified abdominal pain: Secondary | ICD-10-CM

## 2020-12-12 LAB — URINALYSIS, ROUTINE W REFLEX MICROSCOPIC
Bilirubin Urine: NEGATIVE
Hgb urine dipstick: NEGATIVE
Ketones, ur: NEGATIVE
Leukocytes,Ua: NEGATIVE
Nitrite: NEGATIVE
RBC / HPF: NONE SEEN (ref 0–?)
Specific Gravity, Urine: 1.025 (ref 1.000–1.030)
Total Protein, Urine: NEGATIVE
Urine Glucose: NEGATIVE
Urobilinogen, UA: 0.2 (ref 0.0–1.0)
pH: 5.5 (ref 5.0–8.0)

## 2020-12-12 NOTE — Telephone Encounter (Signed)
Dr Manuella Ghazi stated that her GFR was super low per nursing assistant, B12 was also low. Patient hasn't had any pain today after episode yesterday.Lab appointment has been scheduled.

## 2020-12-12 NOTE — Telephone Encounter (Signed)
Left message for patient to return call back.  

## 2020-12-12 NOTE — Telephone Encounter (Signed)
Telephone visit has been scheduled

## 2020-12-12 NOTE — Telephone Encounter (Signed)
If Dr Manuella Ghazi is not addressing these issues, then patinet needs to schedule a visit so I can address them fully

## 2020-12-13 DIAGNOSIS — E43 Unspecified severe protein-calorie malnutrition: Secondary | ICD-10-CM | POA: Diagnosis not present

## 2020-12-13 DIAGNOSIS — R911 Solitary pulmonary nodule: Secondary | ICD-10-CM | POA: Diagnosis not present

## 2020-12-13 DIAGNOSIS — F0390 Unspecified dementia without behavioral disturbance: Secondary | ICD-10-CM | POA: Diagnosis not present

## 2020-12-13 DIAGNOSIS — M6281 Muscle weakness (generalized): Secondary | ICD-10-CM | POA: Diagnosis not present

## 2020-12-13 DIAGNOSIS — F32A Depression, unspecified: Secondary | ICD-10-CM | POA: Diagnosis not present

## 2020-12-13 DIAGNOSIS — H8111 Benign paroxysmal vertigo, right ear: Secondary | ICD-10-CM | POA: Diagnosis not present

## 2020-12-14 ENCOUNTER — Telehealth: Payer: Self-pay

## 2020-12-14 ENCOUNTER — Encounter: Payer: Self-pay | Admitting: Internal Medicine

## 2020-12-14 ENCOUNTER — Telehealth (INDEPENDENT_AMBULATORY_CARE_PROVIDER_SITE_OTHER): Payer: Medicare Other | Admitting: Internal Medicine

## 2020-12-14 VITALS — Ht 62.0 in | Wt 113.5 lb

## 2020-12-14 DIAGNOSIS — E538 Deficiency of other specified B group vitamins: Secondary | ICD-10-CM

## 2020-12-14 DIAGNOSIS — R293 Abnormal posture: Secondary | ICD-10-CM

## 2020-12-14 DIAGNOSIS — F32A Depression, unspecified: Secondary | ICD-10-CM | POA: Diagnosis not present

## 2020-12-14 DIAGNOSIS — R911 Solitary pulmonary nodule: Secondary | ICD-10-CM | POA: Diagnosis not present

## 2020-12-14 DIAGNOSIS — M6281 Muscle weakness (generalized): Secondary | ICD-10-CM

## 2020-12-14 DIAGNOSIS — M1711 Unilateral primary osteoarthritis, right knee: Secondary | ICD-10-CM | POA: Diagnosis not present

## 2020-12-14 DIAGNOSIS — H8111 Benign paroxysmal vertigo, right ear: Secondary | ICD-10-CM | POA: Diagnosis not present

## 2020-12-14 DIAGNOSIS — F0390 Unspecified dementia without behavioral disturbance: Secondary | ICD-10-CM | POA: Diagnosis not present

## 2020-12-14 DIAGNOSIS — E43 Unspecified severe protein-calorie malnutrition: Secondary | ICD-10-CM | POA: Diagnosis not present

## 2020-12-14 NOTE — Telephone Encounter (Signed)
335 pm.  Phone call made to patient to schedule a visit for next week.  Patient will be seen on Monday at 1 pm.

## 2020-12-14 NOTE — Patient Instructions (Signed)
Start taking 1000 mg tylenol twice daily  Kidney function is stable.  No workup needed   Increase water intake to 48 ounces daily and add freh lemon/lime/orange juice to prevent stones   Need lab visit next week

## 2020-12-14 NOTE — Progress Notes (Signed)
Virtual Visit converted to Telephone Note  This visit type was conducted due to national recommendations for restrictions regarding the COVID-19 pandemic (e.g. social distancing).  This format is felt to be most appropriate for this patient at this time.  All issues noted in this document were discussed and addressed.  No physical exam was performed (except for noted visual exam findings with Video Visits).   I connected with@ on 12/14/20 at  9:00 AM EST by a video enabled telemedicine application  and verified that I am speaking with the correct person using two identifiers. Location patient: home Location provider: work or home office Persons participating in the virtual visit: patient, provider and aide Candace  I discussed the limitations, risks, security and privacy concerns of performing an evaluation and management service by telephone and the availability of in person appointments. I also discussed with the patient that there may be a patient responsible charge related to this service. The patient expressed understanding and agreed to proceed.  Interactive audio and video telecommunications were attempted between this provider and patient, however failed, due to patient having technical difficulties   We continued and completed visit with audio only.   Reason for visit: follow up  HPI:  87  Yr old female with remote history of kidney stones presents after having a self limited episode of severe flank /back pain .  The episode occurred one week ago and resolved after several hours.  She requested treatment several days later and  was advised to submit a urine specimen for analysis and culture.  UA was  normal.    She was advised by Dr Manuella Ghazi during neurology follow up that she had a decline in her kidney function . Discussed her GFR and CKD in general  with her today/  She has noticed a decline in her posture again and requested a knee brace and back brace,  [facility] home PT recommended DME  orders Denies back  pain ,  Per aide,  She bends over to favor her right knee . tylenol l1000 mg bid recommended  Her b12 was low (normal in September) per neurology.  She has been taking 1000 mcg daily orally .  needs IF ab screen. Marland Kitchen RBC folate,  and MMA checked.     ROS: See pertinent positives and negatives per HPI.  Past Medical History:  Diagnosis Date  . Arthritis    "knees, shoulders, chest" (02/03/2014)  . Bruises easily   . Cervical cancer ([facility]) 1965  . Concussion 03/11/2020  . High cholesterol   . History of kidney stones   . Hypertension    DENIES   . Tendency toward bleeding easily [facility])     Past Surgical History:  Procedure Laterality Date  . ABDOMINAL HYSTERECTOMY  1965  . APPENDECTOMY    . CHOLECYSTECTOMY    . KNEE ARTHROSCOPY Left    [facility]  . PARTIAL KNEE ARTHROPLASTY Left 01/26/2019   Procedure: UNICOMPARTMENTAL KNEE;  Surgeon: Marchia Bond, MD;  Location: [facility];  Service: Orthopedics;  Laterality: Left;  . SHOULDER OPEN ROTATOR CUFF REPAIR Bilateral 2007,  2010   [facility]  . THROAT SURGERY  2014   "arthritis hugh knot" (02/03/2014)    Family History  Problem Relation Age of Onset  . Peripheral vascular disease Mother   . Stroke Father   . Hyperlipidemia Father   . Heart disease Father 52  . Cancer Neg Hx     SOCIAL HX:  reports that she has quit smoking. Her  smoking use included cigarettes. She has a 14.00 pack-year smoking history. She has never used smokeless tobacco. She reports that she does not drink alcohol and does not use drugs.   Current Outpatient Medications:  .  acetaminophen (TYLENOL) 650 MG CR tablet, Take 650 mg by mouth every 8 (eight) hours as needed for pain. , Disp: , Rfl:  .  diclofenac sodium (VOLTAREN) 1 % GEL, Apply 2 g topically 4 (four) times daily as needed (for knee pain)., Disp: 200 g, Rfl: 11 .  docusate sodium (COLACE) 100 MG capsule, Take 100 mg by mouth 2 (two) times daily., Disp: , Rfl:  .  famotidine (PEPCID) 20 MG  tablet, Take 1 tablet (20 mg total) by mouth daily., Disp: 30 tablet, Rfl: 0 .  liothyronine (CYTOMEL) 5 MCG tablet, Take 1 tablet (5 mcg total) by mouth daily., Disp: 90 tablet, Rfl: 1 .  LORazepam (ATIVAN) 0.5 MG tablet, Take 1 tablet (0.5 mg total) by mouth at bedtime., Disp: 30 tablet, Rfl: 5 .  mirtazapine (REMERON) 30 MG tablet, Take 1 tablet (30 mg total) by mouth at bedtime., Disp: 90 tablet, Rfl: 1 .  Multiple Vitamins-Minerals (CENTRUM SILVER PO), Take 1 tablet by mouth daily., Disp: , Rfl:  .  polyethylene glycol (MIRALAX / GLYCOLAX) packet, Take 17 g by mouth daily as needed for moderate constipation. , Disp: , Rfl:  .  vitamin B-12 (CYANOCOBALAMIN) 1000 MCG tablet, Take 1,000 mcg by mouth daily., Disp: , Rfl:   EXAM:   General impression: alert, cooperative and articulate.  No signs of being in distress  Lungs: speech is fluent sentence length suggests that patient is not short of breath and not punctuated by cough, sneezing or sniffing. Marland Kitchen   Psych: affect normal.  speech is articulate and non pressured .  Denies suicidal thoughts   ASSESSMENT AND PLAN:  Discussed the following assessment and plan:  B12 deficiency - Plan: Intrinsic Factor Antibodies, RBC Folate, Methylmalonic Acid  Primary osteoarthritis of right knee - Plan: For home use only DME Other see comment  Abnormal posture - Plan: For home use only DME Other see comment  Generalized muscle weakness  Abnormal posture Due to right knee pain causing an antalgic gait complicated by advanced thoracolumbar kyphosis and degenerative changes . DME order for back brace written  Generalized muscle weakness Improving  strength and mobility with PT aand improved nutrition.  Continue Home Health PT as she is now more able to participate  Osteoarthritis of right knee Her last I/A steroid injection was in June 2021.  She is requesting a brace for support   B12 deficiency Continue oral supplementation until IF  ab status  can be determined     I discussed the assessment and treatment plan with the patient. The patient was provided an opportunity to ask questions and all were answered. The patient agreed with the plan and demonstrated an understanding of the instructions.   The patient was advised to call back or seek an in-person evaluation if the symptoms worsen or if the condition fails to improve as anticipated.  I provided 30 minutes of non-face-to-face time during this encounter.   Crecencio Mc, MD

## 2020-12-16 NOTE — Assessment & Plan Note (Signed)
Improving  strength and mobility with PT aand improved nutrition.  Continue Home Health PT as she is now more able to participate

## 2020-12-16 NOTE — Assessment & Plan Note (Signed)
Continue oral supplementation until IF  ab status can be determined

## 2020-12-16 NOTE — Assessment & Plan Note (Addendum)
Due to right knee pain causing an antalgic gait complicated by advanced thoracolumbar kyphosis and degenerative changes . DME order for back brace written

## 2020-12-16 NOTE — Assessment & Plan Note (Addendum)
Her last I/A steroid injection was in June 2021.  She is requesting a brace for support

## 2020-12-17 ENCOUNTER — Other Ambulatory Visit: Payer: Self-pay

## 2020-12-17 ENCOUNTER — Other Ambulatory Visit: Payer: Medicare Other

## 2020-12-17 VITALS — BP 106/60 | HR 84 | Temp 97.3°F | Resp 18 | Wt 114.5 lb

## 2020-12-17 DIAGNOSIS — Z515 Encounter for palliative care: Secondary | ICD-10-CM

## 2020-12-17 NOTE — Progress Notes (Signed)
PATIENT NAME: Emily Wu DOB: 1931/02/09 MRN: [id number]  PRIMARY CARE PROVIDER: Crecencio Mc, MD  RESPONSIBLE PARTY:  Acct ID - Guarantor Home Phone Work Phone Relationship Acct Type  [id number] JOSUE, [phone]  Self P/F     [address] [place], [place] [address]    PLAN OF CARE and INTERVENTIONS:               1.  GOALS OF CARE/ ADVANCE CARE PLANNING:  Remain home with private caregivers.               2.  PATIENT/CAREGIVER EDUCATION:  Safety.               4. PERSONAL EMERGENCY PLAN:  Activate 911 for emergencies.               5.  DISEASE STATUS: Joint visit completed with [place], SW.  Patient and private caregiver Emily Wu is present.  Patient reports having a good appetite.  Her weight is maintaining around 113-114 lbs.  Patient is going out daily to pick up lunch and cooking meals at home.  Patient reports issues with a possible kidney stone but has likely passed this over the wekeend.  No other issues reported.  Patient continues to work with PT 2 x weekly.  Patient is using a rolling walker to ambulate safely.  No falls are reported.  Patient is hopeful to have a right knee brace to help with her posture.  She reports needing a knee replacement but will not have this done at the present time.  Patient was started on Donezpil 5 mg by neurology.  Patient is noting some forgetfulness and often looks to her caregiver for assistance.  Patient denies issues with nausea/vomitting.  No complaints of dizziness or headaches.  Patient is engaging in conversation and enjoys sharing her recent activities and hobbies.  Patient states she is doing well overall and feels very fortunate to still be here after having COVID last year.  Patient has a life alert and reports using this 2 x since she obtained it.  Patient is staying alone on Saturdays and at night.  Re-enforced using this device for emergencies.   She recently drove her car for the 1 st time in months around  her neighborhood.  Patient states her caregiver and daughters are driving her to appointments and out for meals.     HISTORY OF PRESENT ILLNESS:  87 year old female with hx of CKD Stage 3.  Patient is being followed by Palliative Care monthly and PRN.  CODE STATUS: Full ADVANCED DIRECTIVES: N MOST FORM: No PPS: 50%   PHYSICAL EXAM:   VITALS:  Temp 97.3 F, BP 106/60 P 84, R 18 and O2 saturation 99%. LUNGS: CTA-denies shortness of breath. CARDIAC: HRR EXTREMITIES: no edema SKIN: warm and dry to touch.  No skin breakdown.  NEURO: alert and oriented with some forgetfulness.       Lorenza Burton, RN

## 2020-12-17 NOTE — Progress Notes (Signed)
COMMUNITY PALLIATIVE CARE SW NOTE  PATIENT NAME: Emily Wu DOB: 12-19-1930 MRN: [id number]  PRIMARY CARE PROVIDER: Crecencio Mc, MD  RESPONSIBLE PARTY:  Acct ID - Guarantor Home Phone Work Phone Relationship Acct Type  [id number] KYMARI, [phone]  Self P/F     [address] [place], [place] [address]     PLAN OF CARE and INTERVENTIONS:             1. GOALS OF CARE/ ADVANCE CARE PLANNING:  Patient is a DNR. Patient's goal is to remain at home for as long as possible. Patients daughter [name] is HCPOA. 2.         SOCIAL/EMOTIONAL/SPIRITUAL ASSESSMENT/ INTERVENTIONS:  SW and Maurine Minister met with patient in patients home. Caregiver, Candace, present as well. Patient lives in a one story home alone. Patient has 24/7 private duty caregivers through [place]. Patients appetite has greatly improved. Current weight is 114.5lbs. No falls. Patient continues to use RW to ambulate. Patient has regular bowel movements. Patient goes out daily with caregiver. Patient has groceries delivered.   RN checked and reviewed vitals and medications. Patients uses Colace QOD. Started on Aricept 62m at HS and B12 vitamins BID. [facility] is [facility] well.  Patient is doing very well. SW discussed goals, reviewed care plan, provided emotional support, used active and reflective listening.  3.         PATIENT/CAREGIVER EDUCATION/ COPING:  Patient A&O during visit, able to answer questions. Patient's spirits were high. Patient able to provide adequate background. Patients husband deceased 6 months ago. Patient has 2 daughters whom are both supportive and live locally.  4.         PERSONAL EMERGENCY PLAN:  Patient/family/caregiver will call 9-1-1 for emergencies. Patient has life alert.  5.         COMMUNITY RESOURCES COORDINATION/ HEALTH CARE NAVIGATION:  Patient's daughters  manages patient care. 6.         FINANCIAL/LEGAL CONCERNS/INTERVENTIONS:  None     SOCIAL  HX:  Social History   Tobacco Use  . Smoking status: Former Smoker    Packs/day: 2.00    Years: 7.00    Pack years: 14.00    Types: Cigarettes  . Smokeless tobacco: Never Used  . Tobacco comment: 02/03/2014 "quit smoking in the 1960's"  Substance Use Topics  . Alcohol use: No    CODE STATUS: DNR ADVANCED DIRECTIVES: Y MOST FORM COMPLETE: N HOSPICE EDUCATION PROVIDED: N  PPS: Patient is able to ambulate with RW independently. Patient is able to independently shower and bathe. Caregiver provides meals.    Time spent: 1hr 233m.      AsDoreene ElandLCSouth [place]

## 2020-12-18 ENCOUNTER — Other Ambulatory Visit: Payer: Medicare Other

## 2020-12-19 DIAGNOSIS — R911 Solitary pulmonary nodule: Secondary | ICD-10-CM | POA: Diagnosis not present

## 2020-12-19 DIAGNOSIS — F0150 Vascular dementia without behavioral disturbance: Secondary | ICD-10-CM | POA: Diagnosis not present

## 2020-12-19 DIAGNOSIS — G309 Alzheimer's disease, unspecified: Secondary | ICD-10-CM | POA: Diagnosis not present

## 2020-12-19 DIAGNOSIS — F32A Depression, unspecified: Secondary | ICD-10-CM | POA: Diagnosis not present

## 2020-12-19 DIAGNOSIS — F0390 Unspecified dementia without behavioral disturbance: Secondary | ICD-10-CM | POA: Diagnosis not present

## 2020-12-19 DIAGNOSIS — E43 Unspecified severe protein-calorie malnutrition: Secondary | ICD-10-CM | POA: Diagnosis not present

## 2020-12-19 DIAGNOSIS — M6281 Muscle weakness (generalized): Secondary | ICD-10-CM | POA: Diagnosis not present

## 2020-12-19 DIAGNOSIS — F0280 Dementia in other diseases classified elsewhere without behavioral disturbance: Secondary | ICD-10-CM | POA: Diagnosis not present

## 2020-12-19 DIAGNOSIS — H8111 Benign paroxysmal vertigo, right ear: Secondary | ICD-10-CM | POA: Diagnosis not present

## 2020-12-20 DIAGNOSIS — M6281 Muscle weakness (generalized): Secondary | ICD-10-CM | POA: Diagnosis not present

## 2020-12-20 DIAGNOSIS — F0390 Unspecified dementia without behavioral disturbance: Secondary | ICD-10-CM | POA: Diagnosis not present

## 2020-12-20 DIAGNOSIS — F32A Depression, unspecified: Secondary | ICD-10-CM | POA: Diagnosis not present

## 2020-12-20 DIAGNOSIS — R911 Solitary pulmonary nodule: Secondary | ICD-10-CM | POA: Diagnosis not present

## 2020-12-20 DIAGNOSIS — H8111 Benign paroxysmal vertigo, right ear: Secondary | ICD-10-CM | POA: Diagnosis not present

## 2020-12-20 DIAGNOSIS — E43 Unspecified severe protein-calorie malnutrition: Secondary | ICD-10-CM | POA: Diagnosis not present

## 2020-12-21 ENCOUNTER — Other Ambulatory Visit: Payer: Self-pay

## 2020-12-21 ENCOUNTER — Other Ambulatory Visit (INDEPENDENT_AMBULATORY_CARE_PROVIDER_SITE_OTHER): Payer: Medicare Other

## 2020-12-21 DIAGNOSIS — E538 Deficiency of other specified B group vitamins: Secondary | ICD-10-CM

## 2020-12-24 DIAGNOSIS — H8112 Benign paroxysmal vertigo, left ear: Secondary | ICD-10-CM | POA: Diagnosis not present

## 2020-12-25 DIAGNOSIS — H8111 Benign paroxysmal vertigo, right ear: Secondary | ICD-10-CM | POA: Diagnosis not present

## 2020-12-25 DIAGNOSIS — F32A Depression, unspecified: Secondary | ICD-10-CM | POA: Diagnosis not present

## 2020-12-25 DIAGNOSIS — E43 Unspecified severe protein-calorie malnutrition: Secondary | ICD-10-CM | POA: Diagnosis not present

## 2020-12-25 DIAGNOSIS — M6281 Muscle weakness (generalized): Secondary | ICD-10-CM | POA: Diagnosis not present

## 2020-12-25 DIAGNOSIS — F0390 Unspecified dementia without behavioral disturbance: Secondary | ICD-10-CM | POA: Diagnosis not present

## 2020-12-25 DIAGNOSIS — H2513 Age-related nuclear cataract, bilateral: Secondary | ICD-10-CM | POA: Diagnosis not present

## 2020-12-25 DIAGNOSIS — R911 Solitary pulmonary nodule: Secondary | ICD-10-CM | POA: Diagnosis not present

## 2020-12-25 LAB — FOLATE RBC: RBC Folate: 1015 ng/mL RBC (ref >280)

## 2020-12-25 LAB — METHYLMALONIC ACID, SERUM: Methylmalonic Acid, Quant: 178 nmol/L (ref 87–318)

## 2020-12-25 LAB — INTRINSIC FACTOR ANTIBODIES: Intrinsic Factor: NEGATIVE

## 2020-12-26 ENCOUNTER — Telehealth: Payer: Self-pay

## 2020-12-26 NOTE — Telephone Encounter (Signed)
Left message to call back for lab results.

## 2020-12-26 NOTE — Progress Notes (Signed)
Your intrinisic factor antibody was negative,  So that means you will be able to continue treating your B12 deficiency  with oral tablets once you have completed any injection schedule I have started you on . A typical daily dose is 1000  To 2500 mcg daily.   There is no maximum dose ( whatever your body doesn't need, it will get rid of through your urine )   Regards.    Elmira Olkowski, MD   

## 2020-12-27 DIAGNOSIS — F0390 Unspecified dementia without behavioral disturbance: Secondary | ICD-10-CM | POA: Diagnosis not present

## 2020-12-27 DIAGNOSIS — R911 Solitary pulmonary nodule: Secondary | ICD-10-CM | POA: Diagnosis not present

## 2020-12-27 DIAGNOSIS — F32A Depression, unspecified: Secondary | ICD-10-CM | POA: Diagnosis not present

## 2020-12-27 DIAGNOSIS — H8111 Benign paroxysmal vertigo, right ear: Secondary | ICD-10-CM | POA: Diagnosis not present

## 2020-12-27 DIAGNOSIS — M6281 Muscle weakness (generalized): Secondary | ICD-10-CM | POA: Diagnosis not present

## 2020-12-27 DIAGNOSIS — E43 Unspecified severe protein-calorie malnutrition: Secondary | ICD-10-CM | POA: Diagnosis not present

## 2020-12-27 NOTE — Telephone Encounter (Signed)
[name] returned call regarding getting patients lab results. She said you can reach her on her cell phone [phone] or call [phone].

## 2020-12-27 NOTE — Telephone Encounter (Signed)
Patient has been called for lab results.

## 2020-12-31 DIAGNOSIS — R911 Solitary pulmonary nodule: Secondary | ICD-10-CM | POA: Diagnosis not present

## 2020-12-31 DIAGNOSIS — M6281 Muscle weakness (generalized): Secondary | ICD-10-CM | POA: Diagnosis not present

## 2020-12-31 DIAGNOSIS — F32A Depression, unspecified: Secondary | ICD-10-CM | POA: Diagnosis not present

## 2020-12-31 DIAGNOSIS — E43 Unspecified severe protein-calorie malnutrition: Secondary | ICD-10-CM | POA: Diagnosis not present

## 2020-12-31 DIAGNOSIS — H8111 Benign paroxysmal vertigo, right ear: Secondary | ICD-10-CM | POA: Diagnosis not present

## 2020-12-31 DIAGNOSIS — F0390 Unspecified dementia without behavioral disturbance: Secondary | ICD-10-CM | POA: Diagnosis not present

## 2021-01-02 DIAGNOSIS — H8111 Benign paroxysmal vertigo, right ear: Secondary | ICD-10-CM | POA: Diagnosis not present

## 2021-01-02 DIAGNOSIS — R911 Solitary pulmonary nodule: Secondary | ICD-10-CM | POA: Diagnosis not present

## 2021-01-02 DIAGNOSIS — F32A Depression, unspecified: Secondary | ICD-10-CM | POA: Diagnosis not present

## 2021-01-02 DIAGNOSIS — F0390 Unspecified dementia without behavioral disturbance: Secondary | ICD-10-CM | POA: Diagnosis not present

## 2021-01-02 DIAGNOSIS — E43 Unspecified severe protein-calorie malnutrition: Secondary | ICD-10-CM | POA: Diagnosis not present

## 2021-01-02 DIAGNOSIS — M6281 Muscle weakness (generalized): Secondary | ICD-10-CM | POA: Diagnosis not present

## 2021-01-03 DIAGNOSIS — M6281 Muscle weakness (generalized): Secondary | ICD-10-CM | POA: Diagnosis not present

## 2021-01-03 DIAGNOSIS — E43 Unspecified severe protein-calorie malnutrition: Secondary | ICD-10-CM | POA: Diagnosis not present

## 2021-01-03 DIAGNOSIS — R911 Solitary pulmonary nodule: Secondary | ICD-10-CM | POA: Diagnosis not present

## 2021-01-03 DIAGNOSIS — H8111 Benign paroxysmal vertigo, right ear: Secondary | ICD-10-CM | POA: Diagnosis not present

## 2021-01-03 DIAGNOSIS — F0390 Unspecified dementia without behavioral disturbance: Secondary | ICD-10-CM | POA: Diagnosis not present

## 2021-01-03 DIAGNOSIS — F32A Depression, unspecified: Secondary | ICD-10-CM | POA: Diagnosis not present

## 2021-01-04 DIAGNOSIS — H8111 Benign paroxysmal vertigo, right ear: Secondary | ICD-10-CM | POA: Diagnosis not present

## 2021-01-04 DIAGNOSIS — F0390 Unspecified dementia without behavioral disturbance: Secondary | ICD-10-CM | POA: Diagnosis not present

## 2021-01-04 DIAGNOSIS — Z87891 Personal history of nicotine dependence: Secondary | ICD-10-CM | POA: Diagnosis not present

## 2021-01-04 DIAGNOSIS — R293 Abnormal posture: Secondary | ICD-10-CM | POA: Diagnosis not present

## 2021-01-04 DIAGNOSIS — M6281 Muscle weakness (generalized): Secondary | ICD-10-CM | POA: Diagnosis not present

## 2021-01-04 DIAGNOSIS — R911 Solitary pulmonary nodule: Secondary | ICD-10-CM | POA: Diagnosis not present

## 2021-01-04 DIAGNOSIS — Z8616 Personal history of COVID-19: Secondary | ICD-10-CM | POA: Diagnosis not present

## 2021-01-04 DIAGNOSIS — E43 Unspecified severe protein-calorie malnutrition: Secondary | ICD-10-CM | POA: Diagnosis not present

## 2021-01-04 DIAGNOSIS — F32A Depression, unspecified: Secondary | ICD-10-CM | POA: Diagnosis not present

## 2021-01-07 DIAGNOSIS — F0390 Unspecified dementia without behavioral disturbance: Secondary | ICD-10-CM | POA: Diagnosis not present

## 2021-01-07 DIAGNOSIS — R911 Solitary pulmonary nodule: Secondary | ICD-10-CM | POA: Diagnosis not present

## 2021-01-07 DIAGNOSIS — H8111 Benign paroxysmal vertigo, right ear: Secondary | ICD-10-CM | POA: Diagnosis not present

## 2021-01-07 DIAGNOSIS — M6281 Muscle weakness (generalized): Secondary | ICD-10-CM | POA: Diagnosis not present

## 2021-01-07 DIAGNOSIS — E43 Unspecified severe protein-calorie malnutrition: Secondary | ICD-10-CM | POA: Diagnosis not present

## 2021-01-07 DIAGNOSIS — F32A Depression, unspecified: Secondary | ICD-10-CM | POA: Diagnosis not present

## 2021-01-08 DIAGNOSIS — R911 Solitary pulmonary nodule: Secondary | ICD-10-CM | POA: Diagnosis not present

## 2021-01-08 DIAGNOSIS — H8111 Benign paroxysmal vertigo, right ear: Secondary | ICD-10-CM | POA: Diagnosis not present

## 2021-01-08 DIAGNOSIS — M6281 Muscle weakness (generalized): Secondary | ICD-10-CM | POA: Diagnosis not present

## 2021-01-08 DIAGNOSIS — E43 Unspecified severe protein-calorie malnutrition: Secondary | ICD-10-CM | POA: Diagnosis not present

## 2021-01-08 DIAGNOSIS — F0390 Unspecified dementia without behavioral disturbance: Secondary | ICD-10-CM | POA: Diagnosis not present

## 2021-01-08 DIAGNOSIS — F32A Depression, unspecified: Secondary | ICD-10-CM | POA: Diagnosis not present

## 2021-01-09 DIAGNOSIS — R911 Solitary pulmonary nodule: Secondary | ICD-10-CM | POA: Diagnosis not present

## 2021-01-09 DIAGNOSIS — M6281 Muscle weakness (generalized): Secondary | ICD-10-CM | POA: Diagnosis not present

## 2021-01-09 DIAGNOSIS — E43 Unspecified severe protein-calorie malnutrition: Secondary | ICD-10-CM | POA: Diagnosis not present

## 2021-01-09 DIAGNOSIS — F0390 Unspecified dementia without behavioral disturbance: Secondary | ICD-10-CM | POA: Diagnosis not present

## 2021-01-09 DIAGNOSIS — H8111 Benign paroxysmal vertigo, right ear: Secondary | ICD-10-CM | POA: Diagnosis not present

## 2021-01-09 DIAGNOSIS — F32A Depression, unspecified: Secondary | ICD-10-CM | POA: Diagnosis not present

## 2021-01-14 DIAGNOSIS — F0390 Unspecified dementia without behavioral disturbance: Secondary | ICD-10-CM | POA: Diagnosis not present

## 2021-01-14 DIAGNOSIS — H8111 Benign paroxysmal vertigo, right ear: Secondary | ICD-10-CM | POA: Diagnosis not present

## 2021-01-14 DIAGNOSIS — E43 Unspecified severe protein-calorie malnutrition: Secondary | ICD-10-CM | POA: Diagnosis not present

## 2021-01-14 DIAGNOSIS — F32A Depression, unspecified: Secondary | ICD-10-CM | POA: Diagnosis not present

## 2021-01-14 DIAGNOSIS — R911 Solitary pulmonary nodule: Secondary | ICD-10-CM | POA: Diagnosis not present

## 2021-01-14 DIAGNOSIS — M6281 Muscle weakness (generalized): Secondary | ICD-10-CM | POA: Diagnosis not present

## 2021-01-17 DIAGNOSIS — M6281 Muscle weakness (generalized): Secondary | ICD-10-CM | POA: Diagnosis not present

## 2021-01-17 DIAGNOSIS — E43 Unspecified severe protein-calorie malnutrition: Secondary | ICD-10-CM | POA: Diagnosis not present

## 2021-01-17 DIAGNOSIS — F32A Depression, unspecified: Secondary | ICD-10-CM | POA: Diagnosis not present

## 2021-01-17 DIAGNOSIS — H8111 Benign paroxysmal vertigo, right ear: Secondary | ICD-10-CM | POA: Diagnosis not present

## 2021-01-17 DIAGNOSIS — R911 Solitary pulmonary nodule: Secondary | ICD-10-CM | POA: Diagnosis not present

## 2021-01-17 DIAGNOSIS — F0390 Unspecified dementia without behavioral disturbance: Secondary | ICD-10-CM | POA: Diagnosis not present

## 2021-01-23 DIAGNOSIS — E43 Unspecified severe protein-calorie malnutrition: Secondary | ICD-10-CM | POA: Diagnosis not present

## 2021-01-23 DIAGNOSIS — F32A Depression, unspecified: Secondary | ICD-10-CM | POA: Diagnosis not present

## 2021-01-23 DIAGNOSIS — F0390 Unspecified dementia without behavioral disturbance: Secondary | ICD-10-CM | POA: Diagnosis not present

## 2021-01-23 DIAGNOSIS — H8111 Benign paroxysmal vertigo, right ear: Secondary | ICD-10-CM | POA: Diagnosis not present

## 2021-01-23 DIAGNOSIS — M6281 Muscle weakness (generalized): Secondary | ICD-10-CM | POA: Diagnosis not present

## 2021-01-23 DIAGNOSIS — R911 Solitary pulmonary nodule: Secondary | ICD-10-CM | POA: Diagnosis not present

## 2021-02-01 DIAGNOSIS — M6281 Muscle weakness (generalized): Secondary | ICD-10-CM | POA: Diagnosis not present

## 2021-02-01 DIAGNOSIS — E43 Unspecified severe protein-calorie malnutrition: Secondary | ICD-10-CM | POA: Diagnosis not present

## 2021-02-01 DIAGNOSIS — H8111 Benign paroxysmal vertigo, right ear: Secondary | ICD-10-CM | POA: Diagnosis not present

## 2021-02-01 DIAGNOSIS — F0390 Unspecified dementia without behavioral disturbance: Secondary | ICD-10-CM | POA: Diagnosis not present

## 2021-02-01 DIAGNOSIS — R911 Solitary pulmonary nodule: Secondary | ICD-10-CM | POA: Diagnosis not present

## 2021-02-01 DIAGNOSIS — F32A Depression, unspecified: Secondary | ICD-10-CM | POA: Diagnosis not present

## 2021-02-12 ENCOUNTER — Telehealth: Payer: Self-pay

## 2021-02-12 NOTE — Telephone Encounter (Signed)
11:40AM: Palliative care SW outreached patient for monthly telephonic visit.  SW left HIPPA compliant VM. Awaiting return call.  Will continue to offer palliative care support.

## 2021-02-22 ENCOUNTER — Telehealth: Payer: Self-pay

## 2021-02-22 DIAGNOSIS — M6281 Muscle weakness (generalized): Secondary | ICD-10-CM

## 2021-02-22 DIAGNOSIS — G8929 Other chronic pain: Secondary | ICD-10-CM

## 2021-02-22 DIAGNOSIS — L821 Other seborrheic keratosis: Secondary | ICD-10-CM | POA: Diagnosis not present

## 2021-02-22 DIAGNOSIS — D225 Melanocytic nevi of trunk: Secondary | ICD-10-CM | POA: Diagnosis not present

## 2021-02-22 DIAGNOSIS — L538 Other specified erythematous conditions: Secondary | ICD-10-CM | POA: Diagnosis not present

## 2021-02-22 DIAGNOSIS — X32XXXA Exposure to sunlight, initial encounter: Secondary | ICD-10-CM | POA: Diagnosis not present

## 2021-02-22 DIAGNOSIS — Z85828 Personal history of other malignant neoplasm of skin: Secondary | ICD-10-CM | POA: Diagnosis not present

## 2021-02-22 DIAGNOSIS — L820 Inflamed seborrheic keratosis: Secondary | ICD-10-CM | POA: Diagnosis not present

## 2021-02-22 DIAGNOSIS — L298 Other pruritus: Secondary | ICD-10-CM | POA: Diagnosis not present

## 2021-02-22 NOTE — Telephone Encounter (Signed)
Vance Gather from Palliative care called in wanted to know if Dr.Tullo could sent  An order for a walker and a knee brace, bedside commode for patient

## 2021-02-22 NOTE — Telephone Encounter (Signed)
934 am Phone call made to patient to complete a telephonic visit.  Patient is voicing her frustration over DME not being provided by home health agency.  She states that the equipment was ordered and she was told it would arrive on her door step but nothing has come.  Patient is in need of walker, cane, bedside commode and either knee brace or back brace.  She is using  DME equipment that belonged to her father.  She states the walker is not the right height for her and can not be lowered any further.  I have reviewed orders in Epic and noted a back brace was ordered by PCP in January but patient states she never received this.  Advised that I would follow up with [facility] to follow up on DME.  66 am Phone call made to [facility] and spoke with [place].  She notes [facility] is who provides DME for patients but patient is no longer under services.  She will follow up with the DME company and call me back.  83 am.  Debroah Loop is calling back to say the DME provider has not received any orders from the PCP and no supplies have been sent.   1026 am.  Phone call to PCP to advise of patient's need for DME.  Call back pending.

## 2021-02-22 NOTE — Telephone Encounter (Signed)
DMEs for walker and bedside commode ordered but would not print   Which knee is the brace for ?

## 2021-02-25 NOTE — Telephone Encounter (Signed)
Orders have been printed signed and faxed to [facility].

## 2021-02-26 ENCOUNTER — Telehealth: Payer: Self-pay

## 2021-02-26 NOTE — Telephone Encounter (Signed)
Pt is requesting rx for walker (one with seat) to be sent to [facility] on [address]. She states that her insurance company will cover it and she was fitted for it today. Please advise

## 2021-02-27 NOTE — Telephone Encounter (Signed)
Orders have been faxed to [facility].

## 2021-02-28 ENCOUNTER — Other Ambulatory Visit: Payer: Self-pay | Admitting: Internal Medicine

## 2021-03-04 ENCOUNTER — Telehealth: Payer: Self-pay

## 2021-03-04 NOTE — Telephone Encounter (Signed)
1126 am.  Follow up call made to patient to ensure DME was received.  No answer on the home phone and unable to leave a message.  Attempted cell phone but no answer and unable to leave a message.  PLAN:  Try back at a later time.

## 2021-03-05 NOTE — Telephone Encounter (Signed)
Patient still has not received her walker . Could you please follow up with [facility]?

## 2021-03-06 NOTE — Telephone Encounter (Signed)
Spoke with [facility] and the lady in logistics said that the walker is ready but will have to be picked up by the pt at the supply store at [facility] Dr. Lorina Rabon. Called pt to let her know that she gave a verbal understanding. Also told pt that if she had any trouble this time to please give me a call so that I can call them again while she is there.

## 2021-03-08 ENCOUNTER — Telehealth: Payer: Self-pay

## 2021-03-08 NOTE — Telephone Encounter (Addendum)
913 am.  Follow up phone call made to patient.  She now has her bedside commode and walker.  She is very pleased with her new walker and feels it is a much better fit.  She is asking about her knee brace.  This has not yet been received.  Patient states home health did measurements with this.  Patient has asked [facility] about a brace and they would be able to provide one from the [facility]. location.  Patient states she only paid $4 for her bedside commode but had to pay for her walker.  Advised patient to follow up with billing as she may have met requirements with Medicare and needed to pay out of pocket.  Advised patient that I would follow up on the knee brace.  No other needs voiced at this time.

## 2021-03-28 ENCOUNTER — Telehealth (INDEPENDENT_AMBULATORY_CARE_PROVIDER_SITE_OTHER): Payer: Medicare Other | Admitting: Internal Medicine

## 2021-03-28 ENCOUNTER — Encounter: Payer: Self-pay | Admitting: Internal Medicine

## 2021-03-28 ENCOUNTER — Other Ambulatory Visit: Payer: Self-pay

## 2021-03-28 VITALS — Ht 62.0 in | Wt 119.0 lb

## 2021-03-28 DIAGNOSIS — R293 Abnormal posture: Secondary | ICD-10-CM

## 2021-03-28 DIAGNOSIS — G8929 Other chronic pain: Secondary | ICD-10-CM

## 2021-03-28 DIAGNOSIS — F0280 Dementia in other diseases classified elsewhere without behavioral disturbance: Secondary | ICD-10-CM

## 2021-03-28 DIAGNOSIS — G309 Alzheimer's disease, unspecified: Secondary | ICD-10-CM

## 2021-03-28 DIAGNOSIS — J069 Acute upper respiratory infection, unspecified: Secondary | ICD-10-CM

## 2021-03-28 DIAGNOSIS — F0150 Vascular dementia without behavioral disturbance: Secondary | ICD-10-CM | POA: Diagnosis not present

## 2021-03-28 DIAGNOSIS — M1711 Unilateral primary osteoarthritis, right knee: Secondary | ICD-10-CM

## 2021-03-28 DIAGNOSIS — M545 Low back pain, unspecified: Secondary | ICD-10-CM

## 2021-03-28 MED ORDER — DONEPEZIL HCL 5 MG PO TABS: 5.0000 mg | ORAL_TABLET | Freq: Every day | ORAL | 1 refills | Status: DC

## 2021-03-28 MED ORDER — BENZONATATE 200 MG PO CAPS: 200.0000 mg | ORAL_CAPSULE | Freq: Three times a day (TID) | ORAL | 1 refills | Status: DC | PRN

## 2021-03-28 NOTE — Assessment & Plan Note (Signed)
Recommend use of claritin for rhinitis without congestion,  mucinex DM and tessalon perles for cough.  Home COVID testing advised. She is unvaccinated by choice

## 2021-03-28 NOTE — Assessment & Plan Note (Signed)
Due to right knee pain causing an antalgic gait ,  Muscle weakness,  complicated by advanced thoracolumbar kyphosis and degenerative changes . DME order for back brace to be sent to [facility]

## 2021-03-28 NOTE — Progress Notes (Signed)
Telephone  Note  This visit type was conducted due to national recommendations for restrictions regarding the COVID-19 pandemic (e.g. social distancing).  This format is felt to be most appropriate for this patient at this time.  All issues noted in this document were discussed and addressed.  No physical exam was performed (except for noted visual exam findings with Video Visits).   I connected with@ on 03/28/21 at 12:00 PM EDT by  telephone due to patient being screened and found positive for acute URI symptoms  .   I  verified that I am speaking with the correct person using two identifiers. Location patient: home Location provider: work or home office Persons participating in the virtual visit: patient, provider and aide Suann Larry  I discussed the limitations, risks, security and privacy concerns of performing an evaluation and management service by telephone and the availability of in person appointments. I also discussed with the patient that there may be a patient responsible charge related to this service. The patient expressed understanding and agreed to proceed.  Reason for visit: URI symptoms   HPI:   87 yr old unvaccinated female with mixed dementia , chronic insomnia diverted from planned face to face visit for annual wellness visit due to URI symptoms of less than 24 hours duration.  Patient states that she worked out in the yard yesterday and woke up today with a  runny nose.  She has been sneezing occasionally,  Denies sore throat, headaches.  Her aide states that she feels warm but has not checked her  temp yet .  She has been Clearing her throat  And coughing frequently this morning.  Caregiver noted that yesterday patient appeared more tired than usual , . patient is eating a fast food lynch and states that she cannot smell it and is not sure she can taste it    DOES NOT WANT COVID VACCINATION   2) wants to resume home PT, which ended 3 weeks ago ([facility])  3) wants a knee  brace for right knee OA and lumbar support/back brace .  These were ordered at last visit in early April but not received by patient  4) Mixed dementia:  Prescribed Aricept 5 mg by Dr Manuella Ghazi.  B12 deficiency also diagnosed  . IF ab was negative.   .    ROS: Patient denies headache, fevers, malaise, unintentional weight loss, skin rash, eye pain, sinus congestion and sinus pain, sore throat, dysphagia,  hemoptysis , , dyspnea, wheezing, chest pain, palpitations, orthopnea, edema, abdominal pain, nausea, melena, diarrhea, constipation, flank pain, dysuria, hematuria, urinary  Frequency, nocturia, numbness, tingling, seizures,  Focal weakness, Loss of consciousness,  Tremor, insomnia, depression, anxiety, and suicidal ideation.      Past Medical History:  Diagnosis Date  . Arthritis    "knees, shoulders, chest" (02/03/2014)  . Bruises easily   . Cervical cancer ([place]) 1965  . Concussion 03/11/2020  . High cholesterol   . History of kidney stones   . Hypertension    DENIES   . Tendency toward bleeding easily [facility])     Past Surgical History:  Procedure Laterality Date  . ABDOMINAL HYSTERECTOMY  1965  . APPENDECTOMY    . CHOLECYSTECTOMY    . KNEE ARTHROSCOPY Left    [place]  . PARTIAL KNEE ARTHROPLASTY Left 01/26/2019   Procedure: UNICOMPARTMENTAL KNEE;  Surgeon: Marchia Bond, MD;  Location: [place];  Service: Orthopedics;  Laterality: Left;  . SHOULDER OPEN ROTATOR CUFF REPAIR Bilateral 2007,  2010   [place]  . THROAT SURGERY  2014   "arthritis hugh knot" (02/03/2014)    Family History  Problem Relation Age of Onset  . Peripheral vascular disease Mother   . Stroke Father   . Hyperlipidemia Father   . Heart disease Father 90  . Cancer Neg Hx     SOCIAL HX:  reports that she has quit smoking. Her smoking use included cigarettes. She has a 14.00 pack-year smoking history. She has never used smokeless tobacco. She reports that she does not drink alcohol and does not use drugs.   Current  Outpatient Medications:  .  acetaminophen (TYLENOL) 650 MG CR tablet, Take 650 mg by mouth every 8 (eight) hours as needed for pain. , Disp: , Rfl:  .  benzonatate (TESSALON) 200 MG capsule, Take 1 capsule (200 mg total) by mouth 3 (three) times daily as needed for cough., Disp: 60 capsule, Rfl: 1 .  diclofenac sodium (VOLTAREN) 1 % GEL, Apply 2 g topically 4 (four) times daily as needed (for knee pain)., Disp: 200 g, Rfl: 11 .  docusate sodium (COLACE) 100 MG capsule, Take 100 mg by mouth 2 (two) times daily., Disp: , Rfl:  .  famotidine (PEPCID) 20 MG tablet, Take 1 tablet (20 mg total) by mouth daily., Disp: 30 tablet, Rfl: 0 .  liothyronine (CYTOMEL) 5 MCG tablet, Take 1 tablet (5 mcg total) by mouth daily., Disp: 90 tablet, Rfl: 1 .  mirtazapine (REMERON) 30 MG tablet, TAKE ONE TABLET (30 MG) BY MOUTH AT BEDTIME, Disp: 90 tablet, Rfl: 1 .  Multiple Vitamins-Minerals (CENTRUM SILVER PO), Take 1 tablet by mouth daily., Disp: , Rfl:  .  polyethylene glycol (MIRALAX / GLYCOLAX) packet, Take 17 g by mouth daily as needed for moderate constipation. , Disp: , Rfl:  .  vitamin B-12 (CYANOCOBALAMIN) 1000 MCG tablet, Take 1,000 mcg by mouth daily., Disp: , Rfl:  .  donepezil (ARICEPT) 5 MG tablet, Take 1 tablet (5 mg total) by mouth at bedtime., Disp: 90 tablet, Rfl: 1  EXAM:  VITALS per patient if applicable:  GENERAL: alert, oriented, appears well and in no acute distress  HEENT: atraumatic, conjunttiva clear, no obvious abnormalities on inspection of external nose and ears  NECK: normal movements of the head and neck  LUNGS: on inspection no signs of respiratory distress, breathing rate appears normal, no obvious gross SOB, gasping or wheezing  CV: no obvious cyanosis  MS: moves all visible extremities without noticeable abnormality  PSYCH/NEURO: pleasant and cooperative, no obvious depression or anxiety, speech and thought processing grossly intact  ASSESSMENT AND PLAN:  Discussed the  following assessment and plan:  Chronic midline low back pain without sciatica - Plan: For home use only DME Other see comment  Osteoarthritis of right knee, unspecified osteoarthritis type - Plan: For home use only DME Other see comment  Acute URI  Abnormal posture  Mixed Alzheimer's and vascular dementia ([place])  Acute URI Recommend use of claritin for rhinitis without congestion,  mucinex DM and tessalon perles for cough.  Home COVID testing advised. She is unvaccinated by choice   Abnormal posture Due to right knee pain causing an antalgic gait ,  Muscle weakness,  complicated by advanced thoracolumbar kyphosis and degenerative changes . DME order for back brace to be sent to [place] of right knee Her last I/A steroid injection was in June 2021.  She is requesting a better brace for support   DME order  sent to [facility]  Mixed Alzheimer's and vascular dementia [facility]) Diagnosed in January by Dr Manuella Ghazi.  Refilling aricept 5 mg daily .  She will need continued supervision of medications and meals to maintain current state of health      I discussed the assessment and treatment plan with the patient. The patient was provided an opportunity to ask questions and all were answered. The patient agreed with the plan and demonstrated an understanding of the instructions.   The patient was advised to call back or seek an in-person evaluation if the symptoms worsen or if the condition fails to improve as anticipated.   I spent 30 minutes dedicated to the care of this patient on the date of this encounter to include pre-visit review of her recent neurologic consultation,  Non Face-to-face time with the patient and aide , and post visit ordering of  therapeutics.    Crecencio Mc, MD

## 2021-03-28 NOTE — Assessment & Plan Note (Signed)
Her last I/A steroid injection was in June 2021.  She is requesting a better brace for support   DME order sent to [place]

## 2021-03-28 NOTE — Assessment & Plan Note (Signed)
Diagnosed in January by Dr Manuella Ghazi.  Refilling aricept 5 mg daily .  She will need continued supervision of medications and meals to maintain current state of health

## 2021-03-29 ENCOUNTER — Telehealth: Payer: Self-pay

## 2021-03-29 DIAGNOSIS — U071 COVID-19: Secondary | ICD-10-CM

## 2021-03-29 NOTE — Telephone Encounter (Signed)
Pt's daughter [name] called and states that her sister tested positive for covid yesterday and now pt has tested positive today. They want to get pt an infusion ASAP. Especially since she was in the hospital before with Covid and it is Friday. Worried about the weekend. Please advise to daughter [name] at [phone]

## 2021-03-29 NOTE — Telephone Encounter (Signed)
Referral placed to the infusion clinic.  It could take up to 72 hours for them to contact her.  Please follow-up with the patient to see how she is feeling.  She needs to remain quarantined at home for 10 days after the onset of her symptoms.  If she develops shortness of breath, chest pain, cough productive of blood, or starts to feel worse or has significantly elevated fevers she needs to be seen in person.

## 2021-03-29 NOTE — Telephone Encounter (Signed)
Spoke with pt's daughter [name] and informed her that the referral was placed and that it could take up to 72 hours before the are able to contact her. Also advised that if her symptoms worsen then she would need to go to the ED or UC. Daughter gave a verbal understanding.

## 2021-03-29 NOTE — Telephone Encounter (Signed)
Pt had a virtual visit with Dr. Derrel Nip yesterday and she told them to let our office know if she ended up testing positive so a referral could be placed.

## 2021-03-30 ENCOUNTER — Other Ambulatory Visit: Payer: Self-pay | Admitting: Family

## 2021-03-30 ENCOUNTER — Telehealth: Payer: Self-pay | Admitting: Family

## 2021-03-30 MED ORDER — NIRMATRELVIR/RITONAVIR (PAXLOVID)TABLET: 2.0000 | ORAL_TABLET | Freq: Two times a day (BID) | ORAL | 0 refills | Status: AC

## 2021-03-30 NOTE — Progress Notes (Signed)
Outpatient Oral COVID Treatment Note  I connected with Emily Wu on 03/30/2021/11:13 AM by telephone and verified that I am speaking with the correct person using two identifiers.  I discussed the limitations, risks, security, and privacy concerns of performing an evaluation and management service by telephone and the availability of in person appointments. I also discussed with the patient that there may be a patient responsible charge related to this service. The patient expressed understanding and agreed to proceed.  Patient location: Home Provider location: Home in [place]  Diagnosis: COVID-19 infection  Purpose of visit: Discussion of potential use of Molnupiravir or Paxlovid, a new treatment for mild to moderate COVID-19 viral infection in non-hospitalized patients.   Subjective: Patient is a 87 y.o. female who has been diagnosed with COVID 19 viral infection.  Their symptoms began on 03/27/21 with runny nose and diarrhea.     Past Medical History:  Diagnosis Date  . Arthritis    "knees, shoulders, chest" (02/03/2014)  . Bruises easily   . Cervical cancer ([place]) 1965  . Concussion 03/11/2020  . High cholesterol   . History of kidney stones   . Hypertension    DENIES   . Tendency toward bleeding easily ([facility])     Allergies  Allergen Reactions  . Tramadol Anaphylaxis and Other (See Comments)    Lowered Blood Pressure  . Amoxicillin Rash  . Promethazine Hcl Rash  . Sulfa Drugs Cross Reactors Rash     Current Outpatient Medications:  .  acetaminophen (TYLENOL) 650 MG CR tablet, Take 650 mg by mouth every 8 (eight) hours as needed for pain. , Disp: , Rfl:  .  benzonatate (TESSALON) 200 MG capsule, Take 1 capsule (200 mg total) by mouth 3 (three) times daily as needed for cough., Disp: 60 capsule, Rfl: 1 .  diclofenac sodium (VOLTAREN) 1 % GEL, Apply 2 g topically 4 (four) times daily as needed (for knee pain)., Disp: 200 g, Rfl: 11 .  docusate sodium (COLACE) 100 MG capsule, Take  100 mg by mouth 2 (two) times daily., Disp: , Rfl:  .  donepezil (ARICEPT) 5 MG tablet, Take 1 tablet (5 mg total) by mouth at bedtime., Disp: 90 tablet, Rfl: 1 .  famotidine (PEPCID) 20 MG tablet, Take 1 tablet (20 mg total) by mouth daily., Disp: 30 tablet, Rfl: 0 .  liothyronine (CYTOMEL) 5 MCG tablet, Take 1 tablet (5 mcg total) by mouth daily., Disp: 90 tablet, Rfl: 1 .  mirtazapine (REMERON) 30 MG tablet, TAKE ONE TABLET (30 MG) BY MOUTH AT BEDTIME, Disp: 90 tablet, Rfl: 1 .  Multiple Vitamins-Minerals (CENTRUM SILVER PO), Take 1 tablet by mouth daily., Disp: , Rfl:  .  polyethylene glycol (MIRALAX / GLYCOLAX) packet, Take 17 g by mouth daily as needed for moderate constipation. , Disp: , Rfl:  .  vitamin B-12 (CYANOCOBALAMIN) 1000 MCG tablet, Take 1,000 mcg by mouth daily., Disp: , Rfl:   Objective: Patient appears/sounds congested and pleasant. .  They are in no apparent distress.  Breathing is non labored.  Mood and behavior are normal.  Laboratory Data:  No results found for this or any previous visit (from the past 2160 hour(s)).   Assessment: 87 y.o. female with mild/moderate COVID 19 viral infection diagnosed on 5/5 at high risk for progression to severe COVID 19.  Plan:  This patient is a 87 y.o. female that meets the following criteria for Emergency Use Authorization of: Paxlovid 1. Age >12 yr AND > 40 kg  2. SARS-COV-2 positive test 3. Symptom onset < 5 days 4. Mild-to-moderate COVID disease with high risk for severe progression to hospitalization or death  I have spoken and communicated the following to the patient or parent/caregiver regarding: 1. Paxlovid is an unapproved drug that is authorized for use under an Emergency Use Authorization.  2. There are no adequate, approved, available products for the treatment of COVID-19 in adults who have mild-to-moderate COVID-19 and are at high risk for progressing to severe COVID-19, including hospitalization or death. 3. Other  therapeutics are currently authorized. For additional information on all products authorized for treatment or prevention of COVID-19, please see [employer].  4. There are benefits and risks of taking this treatment as outlined in the "Fact Sheet for Patients and Caregivers."  5. "Fact Sheet for Patients and Caregivers" was reviewed with patient. A hard copy will be provided to patient from pharmacy prior to the patient receiving treatment. 6. Patients should continue to self-isolate and use infection control measures (e.g., wear mask, isolate, social distance, avoid sharing personal items, clean and disinfect "high touch" surfaces, and frequent handwashing) according to CDC guidelines.  7. The patient or parent/caregiver has the option to accept or refuse treatment. 8. Patient medication history was reviewed for potential drug interactions:No drug interactions 9. Patient's GFR was calculated to be 58, and they were therefore prescribed Reduced dose (GFR 30-60) - nirmatrelvir 150mg  tab (1 tablet) by mouth twice daily AND ritonavir 100mg  tab (1 tablet) by mouth twice daily   After reviewing above information with the patient, the patient agrees to receive Paxlovid.  Follow up instructions:    . Take prescription BID x 5 days as directed . Reach out to pharmacist for counseling on medication if desired . For concerns regarding further COVID symptoms please follow up with your PCP or urgent care . For urgent or life-threatening issues, seek care at your local emergency department  The patient was provided an opportunity to ask questions, and all were answered. The patient agreed with the plan and demonstrated an understanding of the instructions.   Script sent to [facility] on [address] and opted to pick up [employer].  The patient was advised to call their PCP or seek an in-person  evaluation if the symptoms worsen or if the condition fails to improve as anticipated.   I provided 16 minutes of non face-to-face telephone visit time during this encounter, and > 50% was spent counseling as documented under my assessment & plan.  [name], FNP 03/30/2021 /11:13 AM

## 2021-03-30 NOTE — Telephone Encounter (Signed)
Called to discuss with patient about COVID-19 symptoms and the use of one of the available treatments for those with mild to moderate Covid symptoms and at a high risk of hospitalization.  Pt appears to qualify for outpatient treatment due to co-morbid conditions and/or a member of an at-risk group in accordance with the FDA Emergency Use Authorization.    Symptom onset: 03/27/21 Vaccinated: No Booster? No Immunocompromised? No Qualifiers: Age NIH Criteria:   Emily Wu has had symptoms starting on 03/27/21 and continues to have diarrhea. I spoke with Emily Wu regarding treatment options with Bebtelovimab and Paxlovid including the risks, benefits and potential costs. She wishes to proceed with Paxlovid. Last GFR was boderline and prescribe reduced dose Paxlovid.   Terri Piedra, NP 03/30/2021 11:12 AM

## 2021-04-15 ENCOUNTER — Other Ambulatory Visit: Payer: Medicare Other

## 2021-04-15 ENCOUNTER — Other Ambulatory Visit: Payer: Self-pay

## 2021-04-15 DIAGNOSIS — Z515 Encounter for palliative care: Secondary | ICD-10-CM

## 2021-04-15 NOTE — Progress Notes (Signed)
PATIENT NAME: Emily Wu DOB: October 15, 1931 MRN: [id number]  PRIMARY CARE PROVIDER: Crecencio Mc, MD  RESPONSIBLE PARTY:  Acct ID - Guarantor Home Phone Work Phone Relationship Acct Type  [id number] SOPHIAROSE, [phone]  Self P/F     [address], [place], [place] [address]   TELEHEALTH VISIT STATEMENT Due to the COVID-19 crisis, this visit was done via telemedicine from my office and it was initiated and consent by this patient and or family.   PLAN OF CARE and INTERVENTIONS:               1.  GOALS OF CARE/ ADVANCE CARE PLANNING:  Patient desires to remain in her home under the care of her daughter and private sitter.               2.  PATIENT/CAREGIVER EDUCATION:  DME               4. PERSONAL EMERGENCY PLAN:  Activate 911 for emergencies.               5.  DISEASE STATUS:  Telephonic visit completed with patient.  She notes recovering from COVID-19 recently and that she was without a caregiver for 17 days.  Her daughters were able to assist during this time.  Patient states her caregiver is back with her today.  Patient denies any residual effects of COVID.  She states she has remained home for the last 17 days and is looking forward to getting out this week.  Patient currently uses a rolling walker for ambulation.  There are no falls reported. Patient notes she is using her walker anytime she gets up.  She also has a bedside commode that she utilizes.  Patient had requested additional DME from her PCP but these have not been filled as of today.  Patient states she is very frustrated with this as she was told months ago by therapy that she would have a back brace and knee brace.  Chart reviewed and orders were sent to [facility]. I will follow up on this order.   Patient states her appetite has been good and she is able to do light meal prep.  She does not feel there has been any recent weight loss.  Patient's daughters continue to fill her pill box every 2 weeks.   Patient states she is compliant with medications and denies any need for refills.  Patient is agreeable to a home visit on June 3rd at 10 am.  910 am.  Phone call made to [facility].  They are unable to fill the order for the back brace as they are not in-network with the insurance.  Order will be sent to [facility] to be filled.    HISTORY OF PRESENT ILLNESS:  88 year old female with Dementia.  Patient is being followed by Palliative Care monthly and PRN.  CODE STATUS: Full ADVANCED DIRECTIVES: Yes MOST FORM: No PPS: 50%    Lorenza Burton, RN

## 2021-04-26 ENCOUNTER — Other Ambulatory Visit: Payer: Self-pay

## 2021-04-26 ENCOUNTER — Other Ambulatory Visit: Payer: Medicare Other

## 2021-04-26 VITALS — BP 138/62 | HR 63 | Temp 97.4°F | Resp 18 | Wt 117.0 lb

## 2021-04-26 DIAGNOSIS — Z515 Encounter for palliative care: Secondary | ICD-10-CM

## 2021-04-26 NOTE — Progress Notes (Signed)
PATIENT NAME: Emily Wu DOB: May 08, 1931 MRN: [id number]  PRIMARY CARE PROVIDER: Crecencio Mc, MD  RESPONSIBLE PARTY:  Acct ID - Guarantor Home Phone Work Phone Relationship Acct Type  [id number] MARIAGUADALUPE, [id number]  Self P/F     [address] [place], [place] [address]    PLAN OF CARE and INTERVENTIONS:               1.  GOALS OF CARE/ ADVANCE CARE PLANNING:  Remain home and independent under the care of her daughters and private caregivers.               2.  PATIENT/CAREGIVER EDUCATION:  Medicare coverage and safety.               4. PERSONAL EMERGENCY PLAN:  Activate 911 for emergencies.               5.  DISEASE STATUS:  Joint visit with patient and her sitter Candace.  Patient voiced her frustration about DME.  She now has a back brace and knee brace.  Patient states she had to pay for all of her DME except for her toilet seat.  We discussed Medicare coverage of DME.  Patient feels she is doing okay overall.  The knee brace is helping with the pain to her right knee.  She has been having some discomfort to the left knee, likely related to overcompensation from the right knee pain.   Patient currently has a life alert but has not needed to activate this in recent months.  She currently has a rolling walker and reports no falls.   Patient's appetite continues to be good.  Her weight is maintaining at 117 lbs.  Her caregiver and daughters are assisting with meal preparations.  Private caregiver is present from 8-1 pm 5 days a week.  Patient recalls her recent bout of COVID in May.  She feels there maybe some residual fatigue but no other issues.  Patient has not received COVID vaccinations and declines now.   Patient's daughters continue to fill the pill box and no refills are needed.  Patient will follow up with Dr. Brigitte Pulse on June 30th.   Patient denies issues with insomnia.  She continues with Remeron and feels this has been very helpful.  Patient denies daytime napping.   HISTORY OF PRESENT ILLNESS:  87 year old female with Vascular Dementia.  Patient is being followed by Palliative Care monthly and PRN.  CODE STATUS: Full  ADVANCED DIRECTIVES: Yes MOST FORM: No PPS: 50%   PHYSICAL EXAM:   VITALS: Today's Vitals   04/26/21 1002  BP: 138/62  Pulse: 63  Resp: 18  Temp: (!) 97.4 F (36.3 C)  SpO2: 97%  Weight: 117 lb (53.1 kg)  PainSc: 0-No pain    LUNGS: CTA, no wheezes, rhonchi or rales. CARDIAC: HRR EXTREMITIES: - for edema SKIN: warm and dry to touch.  No skin breakdown noted by caregiver.  NEURO: alert and oriented x 4.  Patient notes some short-term memory deficits from time to time.       Lorenza Burton, RN

## 2021-05-28 ENCOUNTER — Other Ambulatory Visit: Payer: Self-pay | Admitting: Internal Medicine

## 2021-06-06 ENCOUNTER — Ambulatory Visit (INDEPENDENT_AMBULATORY_CARE_PROVIDER_SITE_OTHER): Payer: Medicare Other

## 2021-06-06 VITALS — Ht 62.0 in | Wt 117.0 lb

## 2021-06-06 DIAGNOSIS — Z0000 Encounter for general adult medical examination without abnormal findings: Secondary | ICD-10-CM | POA: Diagnosis not present

## 2021-06-06 NOTE — Progress Notes (Addendum)
Subjective:   Emily Wu is a 87 y.o. female who presents for Medicare Annual (Subsequent) preventive examination.  Review of Systems    No ROS.  Medicare Wellness Virtual Visit.  Visual/audio telehealth visit, UTA vital signs.   See social history for additional risk factors.   Cardiac Risk Factors include: advanced age (>51men, >39 women)     Objective:    Today's Vitals   06/06/21 1404  Weight: 117 lb (53.1 kg)  Height: 5\' 2"  (1.575 m)   Body mass index is 21.4 kg/m.  Advanced Directives 06/06/2021 07/16/2020 06/05/2020 03/02/2020 06/02/2019 01/27/2019 01/11/2019  Does Patient Have a Medical Advance Directive? Yes No Yes No Yes Yes Yes  Type of Paramedic of [place];Living will - [place];Living will - [place];Living will [place];Living will [place];Living will  Does patient want to make changes to medical advance directive? No - Patient declined - No - Patient declined - No - Patient declined No - Patient declined No - Patient declined  Copy of [place] in Chart? Yes - validated most recent copy scanned in chart (See row information) - No - copy requested - No - copy requested - -  Would patient like information on creating a medical advance directive? - No - Patient declined - No - Patient declined - - -    Current Medications (verified) Outpatient Encounter Medications as of 06/06/2021  Medication Sig   acetaminophen (TYLENOL) 650 MG CR tablet Take 650 mg by mouth every 8 (eight) hours as needed for pain.    benzonatate (TESSALON) 200 MG capsule Take 1 capsule (200 mg total) by mouth 3 (three) times daily as needed for cough.   diclofenac sodium (VOLTAREN) 1 % GEL Apply 2 g topically 4 (four) times daily as needed (for knee pain).   docusate sodium (COLACE) 100 MG capsule Take 100 mg by mouth 2 (two) times daily.   donepezil (ARICEPT) 5 MG tablet  Take 1 tablet (5 mg total) by mouth at bedtime.   famotidine (PEPCID) 20 MG tablet Take 1 tablet (20 mg total) by mouth daily.   liothyronine (CYTOMEL) 5 MCG tablet Take 1 tablet (5 mcg total) by mouth daily.   mirtazapine (REMERON) 30 MG tablet TAKE ONE TABLET (30 MG) BY MOUTH AT BEDTIME   Multiple Vitamins-Minerals (CENTRUM SILVER PO) Take 1 tablet by mouth daily.   polyethylene glycol (MIRALAX / GLYCOLAX) packet Take 17 g by mouth daily as needed for moderate constipation.    vitamin B-12 (CYANOCOBALAMIN) 1000 MCG tablet Take 1,000 mcg by mouth daily.   No facility-administered encounter medications on file as of 06/06/2021.    Allergies (verified) Tramadol, Amoxicillin, Promethazine hcl, and Sulfa drugs cross reactors   History: Past Medical History:  Diagnosis Date   Arthritis    "knees, shoulders, chest" (02/03/2014)   Bruises easily    Cervical cancer [facility]) 1965   Concussion 03/11/2020   High cholesterol    History of kidney stones    Hypertension    DENIES    Tendency toward bleeding easily [facility])    Past Surgical History:  Procedure Laterality Date   ABDOMINAL HYSTERECTOMY  1965   APPENDECTOMY     CHOLECYSTECTOMY     KNEE ARTHROSCOPY Left    [place]   PARTIAL KNEE ARTHROPLASTY Left 01/26/2019   Procedure: UNICOMPARTMENTAL KNEE;  Surgeon: Marchia Bond, MD;  Location: [place];  Service: Orthopedics;  Laterality:  Left;   SHOULDER OPEN ROTATOR CUFF REPAIR Bilateral 2007,  2010   [place]   THROAT SURGERY  2014   "arthritis hugh knot" (02/03/2014)   Family History  Problem Relation Age of Onset   Peripheral vascular disease Mother    Stroke Father    Hyperlipidemia Father    Heart disease Father 70   Cancer Neg Hx    Social History   Socioeconomic History   Marital status: Married    Spouse name: Not on file   Number of children: Not on file   Years of education: Not on file   Highest education level: Not on file  Occupational History   Not on file  Tobacco Use    Smoking status: Former    Packs/day: 2.00    Years: 7.00    Pack years: 14.00    Types: Cigarettes   Smokeless tobacco: Never   Tobacco comments:    02/03/2014 "quit smoking in the 1960's"  Vaping Use   Vaping Use: Never used  Substance and Sexual Activity   Alcohol use: No   Drug use: No   Sexual activity: Never  Other Topics Concern   Not on file  Social History Narrative   Not on file   Social Determinants of Health   Financial Resource Strain: Low Risk    Difficulty of Paying Living Expenses: Not hard at all  Food Insecurity: No Food Insecurity   Worried About Charity fundraiser in the Last Year: Never true   [place] in the Last Year: Never true  Transportation Needs: No Transportation Needs   Lack of Transportation (Medical): No   Lack of Transportation (Non-Medical): No  Physical Activity: Not on file  Stress: No Stress Concern Present   Feeling of Stress : Not at all  Social Connections: Unknown   Frequency of Communication with Friends and Family: More than three times a week   Frequency of Social Gatherings with Friends and Family: More than three times a week   Attends Religious Services: Not on file   Active Member of Clubs or Organizations: Not on file   Attends Archivist Meetings: Not on file   Marital Status: Widowed    Tobacco Counseling Counseling given: Not Answered Tobacco comments: 02/03/2014 "quit smoking in the 1960's"   Clinical Intake:  Pre-visit preparation completed: Yes        Diabetes: No  How often do you need to have someone help you when you read instructions, pamphlets, or other written materials from your doctor or pharmacy?: 1 - Never   Interpreter Needed?: No      Activities of Daily Living In your present state of health, do you have any difficulty performing the following activities: 06/06/2021 07/17/2020  Hearing? N N  Vision? N N  Difficulty concentrating or making decisions? Y N  Comment Hx  Dementia/Alzhemier's -  Walking or climbing stairs? Y N  Dressing or bathing? N N  Doing errands, shopping? Y N  Comment Does not drive -  Preparing Food and eating ? Y -  Using the Toilet? N -  In the past six months, have you accidently leaked urine? N -  Comment Managed with daily pad -  Do you have problems with loss of bowel control? N -  Managing your Medications? Y -  Comment Managed by daughter -  Managing your Finances? Y -  Comment Daughter manages -  Housekeeping or managing your Housekeeping? Y -  Some recent data might be hidden    Patient Care Team: Crecencio Mc, MD as PCP - General (Internal Medicine)  Indicate any recent Medical Services you may have received from other than [facility] providers in the past year (date may be approximate).     Assessment:   This is a routine wellness examination for [place].  I connected with Emily Wu today by telephone and verified that I am speaking with the correct person using two identifiers. Location patient: home Location provider: work Persons participating in the virtual visit: patient, Marine scientist.    I discussed the limitations, risks, security and privacy concerns of performing an evaluation and management service by telephone and the availability of in person appointments. The patient expressed understanding and verbally consented to this telephonic visit.    Interactive audio and video telecommunications were attempted between this provider and patient, however failed, due to patient having technical difficulties OR patient did not have access to video capability.  We continued and completed visit with audio only.  Some vital signs may be absent or patient reported.   Hearing/Vision screen Hearing Screening - Comments:: Patient is able to hear conversational tones without difficulty.  No issues reported.   Vision Screening - Comments:: Wears corrective lenses They have seen their ophthalmologist in the last 12 months.     Dietary issues and exercise activities discussed: Current Exercise Habits: Home exercise routine, Type of exercise: stretching, Time (Minutes): 15, Frequency (Times/Week): 7, Weekly Exercise (Minutes/Week): 105, Intensity: Mild Regular diet Good water intake   Goals Addressed               This Visit's Progress     Patient Stated     Follow up with Primary Care Provider (pt-stated)        Keep all scheduled maintenance appointments         Depression Screen PHQ 2/9 Scores 06/06/2021 03/28/2021 12/14/2020 06/18/2020 06/05/2020 09/22/2019 07/27/2019  PHQ - 2 Score 0 - 0 0 0 0 0  PHQ- 9 Score - - 0 - - - 0  Exception Documentation - Medical reason - - - - -    Fall Risk Fall Risk  06/06/2021 12/14/2020 09/13/2020 08/07/2020 06/18/2020  Falls in the past year? 0 0 0 1 1  Comment - - - - -  Number falls in past yr: 0 - - 0 1  Injury with Fall? - - - 1 1  Risk for fall due to : - - - Impaired mobility;Impaired balance/gait;Orthopedic patient -  Follow up Falls evaluation completed Falls evaluation completed Falls evaluation completed Falls evaluation completed Falls evaluation completed    [place]: Handrails in use when climbing stairs?Yes Home free of loose throw rugs in walkways, pet beds, electrical cords, etc? Yes  Adequate lighting in your home to reduce risk of falls? Yes   ASSISTIVE DEVICES UTILIZED TO PREVENT FALLS: Life alert? Yes  Use of a cane, walker or w/c? Yes  Grab bars in the bathroom? Yes  Shower chair or bench in shower? Yes  Elevated toilet seat or a handicapped toilet? Yes   TIMED UP AND GO: Was the test performed? No .   Cognitive Function:  Patient is alert.   6CIT Screen 06/06/2021 06/05/2020 06/02/2019  What Year? 0 points 0 points 0 points  What month? 0 points 0 points 0 points  What time? 0 points - 0 points  Count back from 20 - - 0 points  Months  in reverse 4 points 0 points 0 points  Repeat phrase - 0 points  -    Immunizations Immunization History  Administered Date(s) Administered   Influenza-Unspecified 09/24/2018   Pneumococcal Conjugate-13 01/18/2014   Pneumococcal Polysaccharide-23 01/09/2015   Tdap 11/07/2013   Zoster Recombinat (Shingrix) 08/04/2018   Zoster, Live 01/19/2012   Health Maintenance Health Maintenance  Topic Date Due   Zoster Vaccines- Shingrix (2 of 2) 09/06/2021 (Originally 09/29/2018)   INFLUENZA VACCINE  06/24/2021   TETANUS/TDAP  11/08/2023   DEXA SCAN  Completed   PNA vac Low Risk Adult  Completed   HPV VACCINES  Aged Out   MAMMOGRAM  Discontinued   COVID-19 Vaccine  Discontinued   Colorectal cancer screening: No longer required.   Mammogram status: No longer required due to aged out.  Lung Cancer Screening: (Low Dose CT Chest recommended if Age 57-80 years, 30 pack-year currently smoking OR have quit w/in 15years.) does not qualify.   Vision Screening: Recommended annual ophthalmology exams for early detection of glaucoma and other disorders of the eye.  Dental Screening: Recommended annual dental exams for proper oral hygiene  Community Resource Referral / Chronic Care Management: CRR required this visit?  No   CCM required this visit?  No      Plan:   Keep all routine maintenance appointments.   I have personally reviewed and noted the following in the patient's chart:   Medical and social history Use of alcohol, tobacco or illicit drugs  Current medications and supplements including opioid prescriptions. Patient is not currently taking opioid.  Functional ability and status Nutritional status Physical activity Advanced directives List of other physicians Hospitalizations, surgeries, and ER visits in previous 12 months Vitals Screenings to include cognitive, depression, and falls Referrals and appointments  In addition, I have reviewed and discussed with patient certain preventive protocols, quality metrics, and best practice  recommendations. A written personalized care plan for preventive services as well as general preventive health recommendations were provided to patient via mail.     OBrien-Blaney, Needham Biggins L, LPN   [id number]    I have reviewed the above information and agree with above.   Deborra Medina, MD

## 2021-06-06 NOTE — Patient Instructions (Addendum)
Emily Wu , Thank you for taking time to come for your Medicare Wellness Visit. I appreciate your ongoing commitment to your health goals. Please review the following plan we discussed and let me know if I can assist you in the future.   These are the goals we discussed:  Goals       Patient Stated     Follow up with Primary Care Provider (pt-stated)      Keep all scheduled maintenance appointments          This is a list of the screening recommended for you and due dates:  Health Maintenance  Topic Date Due   Zoster (Shingles) Vaccine (2 of 2) 09/06/2021*   Flu Shot  06/24/2021   Tetanus Vaccine  11/08/2023   DEXA scan (bone density measurement)  Completed   Pneumonia vaccines  Completed   HPV Vaccine  Aged Out   Mammogram  Discontinued   COVID-19 Vaccine  Discontinued  *Topic was postponed. The date shown is not the original due date.    Advanced directives: on file  Conditions/risks identified: none new  Follow up in one year for your annual wellness visit    Preventive Care 65 Years and Older, Female Preventive care refers to lifestyle choices and visits with your health care provider that can promote health and wellness. What does preventive care include? A yearly physical exam. This is also called an annual well check. Dental exams once or twice a year. Routine eye exams. Ask your health care provider how often you should have your eyes checked. Personal lifestyle choices, including: Daily care of your teeth and gums. Regular physical activity. Eating a healthy diet. Avoiding tobacco and drug use. Limiting alcohol use. Practicing safe sex. Taking low-dose aspirin every day. Taking vitamin and mineral supplements as recommended by your health care provider. What happens during an annual well check? The services and screenings done by your health care provider during your annual well check will depend on your age, overall health, lifestyle risk factors, and  family history of disease. Counseling  Your health care provider may ask you questions about your: Alcohol use. Tobacco use. Drug use. Emotional well-being. Home and relationship well-being. Sexual activity. Eating habits. History of falls. Memory and ability to understand (cognition). Work and work Statistician. Reproductive health. Screening  You may have the following tests or measurements: Height, weight, and BMI. Blood pressure. Lipid and cholesterol levels. These may be checked every 5 years, or more frequently if you are over 23 years old. Skin check. Lung cancer screening. You may have this screening every year starting at age 54 if you have a 30-pack-year history of smoking and currently smoke or have quit within the past 15 years. Fecal occult blood test (FOBT) of the stool. You may have this test every year starting at age 23. Flexible sigmoidoscopy or colonoscopy. You may have a sigmoidoscopy every 5 years or a colonoscopy every 10 years starting at age 76. Hepatitis C blood test. Hepatitis B blood test. Sexually transmitted disease (STD) testing. Diabetes screening. This is done by checking your blood sugar (glucose) after you have not eaten for a while (fasting). You may have this done every 1-3 years. Bone density scan. This is done to screen for osteoporosis. You may have this done starting at age 80. Mammogram. This may be done every 1-2 years. Talk to your health care provider about how often you should have regular mammograms. Talk with your health care provider about your  test results, treatment options, and if necessary, the need for more tests. Vaccines  Your health care provider may recommend certain vaccines, such as: Influenza vaccine. This is recommended every year. Tetanus, diphtheria, and acellular pertussis (Tdap, Td) vaccine. You may need a Td booster every 10 years. Zoster vaccine. You may need this after age 83. Pneumococcal 13-valent conjugate  (PCV13) vaccine. One dose is recommended after age 16. Pneumococcal polysaccharide (PPSV23) vaccine. One dose is recommended after age 18. Talk to your health care provider about which screenings and vaccines you need and how often you need them. This information is not intended to replace advice given to you by your health care provider. Make sure you discuss any questions you have with your health care provider. Document Released: 12/07/2015 Document Revised: 07/30/2016 Document Reviewed: 09/11/2015 Elsevier Interactive Patient Education  2017 [place] Prevention in the Home Falls can cause injuries. They can happen to people of all ages. There are many things you can do to make your home safe and to help prevent falls. What can I do on the outside of my home? Regularly fix the edges of walkways and driveways and fix any cracks. Remove anything that might make you trip as you walk through a door, such as a raised step or threshold. Trim any bushes or trees on the path to your home. Use bright outdoor lighting. Clear any walking paths of anything that might make someone trip, such as rocks or tools. Regularly check to see if handrails are loose or broken. Make sure that both sides of any steps have handrails. Any raised decks and porches should have guardrails on the edges. Have any leaves, snow, or ice cleared regularly. Use sand or salt on walking paths during winter. Clean up any spills in your garage right away. This includes oil or grease spills. What can I do in the bathroom? Use night lights. Install grab bars by the toilet and in the tub and shower. Do not use towel bars as grab bars. Use non-skid mats or decals in the tub or shower. If you need to sit down in the shower, use a plastic, non-slip stool. Keep the floor dry. Clean up any water that spills on the floor as soon as it happens. Remove soap buildup in the tub or shower regularly. Attach bath mats securely with  double-sided non-slip rug tape. Do not have throw rugs and other things on the floor that can make you trip. What can I do in the bedroom? Use night lights. Make sure that you have a light by your bed that is easy to reach. Do not use any sheets or blankets that are too big for your bed. They should not hang down onto the floor. Have a firm chair that has side arms. You can use this for support while you get dressed. Do not have throw rugs and other things on the floor that can make you trip. What can I do in the kitchen? Clean up any spills right away. Avoid walking on wet floors. Keep items that you use a lot in easy-to-reach places. If you need to reach something above you, use a strong step stool that has a grab bar. Keep electrical cords out of the way. Do not use floor polish or wax that makes floors slippery. If you must use wax, use non-skid floor wax. Do not have throw rugs and other things on the floor that can make you trip. What can I do with my  stairs? Do not leave any items on the stairs. Make sure that there are handrails on both sides of the stairs and use them. Fix handrails that are broken or loose. Make sure that handrails are as long as the stairways. Check any carpeting to make sure that it is firmly attached to the stairs. Fix any carpet that is loose or worn. Avoid having throw rugs at the top or bottom of the stairs. If you do have throw rugs, attach them to the floor with carpet tape. Make sure that you have a light switch at the top of the stairs and the bottom of the stairs. If you do not have them, ask someone to add them for you. What else can I do to help prevent falls? Wear shoes that: Do not have high heels. Have rubber bottoms. Are comfortable and fit you well. Are closed at the toe. Do not wear sandals. If you use a stepladder: Make sure that it is fully opened. Do not climb a closed stepladder. Make sure that both sides of the stepladder are locked  into place. Ask someone to hold it for you, if possible. Clearly mark and make sure that you can see: Any grab bars or handrails. First and last steps. Where the edge of each step is. Use tools that help you move around (mobility aids) if they are needed. These include: Canes. Walkers. Scooters. Crutches. Turn on the lights when you go into a dark area. Replace any light bulbs as soon as they burn out. Set up your furniture so you have a clear path. Avoid moving your furniture around. If any of your floors are uneven, fix them. If there are any pets around you, be aware of where they are. Review your medicines with your doctor. Some medicines can make you feel dizzy. This can increase your chance of falling. Ask your doctor what other things that you can do to help prevent falls. This information is not intended to replace advice given to you by your health care provider. Make sure you discuss any questions you have with your health care provider. Document Released: 09/06/2009 Document Revised: 04/17/2016 Document Reviewed: 12/15/2014 Elsevier Interactive Patient Education  2017 [employer].

## 2021-06-13 ENCOUNTER — Other Ambulatory Visit: Payer: Self-pay | Admitting: Internal Medicine

## 2021-07-01 DIAGNOSIS — H8112 Benign paroxysmal vertigo, left ear: Secondary | ICD-10-CM | POA: Diagnosis not present

## 2021-07-04 ENCOUNTER — Other Ambulatory Visit: Payer: Self-pay

## 2021-07-04 ENCOUNTER — Ambulatory Visit (INDEPENDENT_AMBULATORY_CARE_PROVIDER_SITE_OTHER): Payer: Medicare Other | Admitting: Internal Medicine

## 2021-07-04 ENCOUNTER — Encounter: Payer: Self-pay | Admitting: Internal Medicine

## 2021-07-04 VITALS — BP 136/54 | HR 65 | Temp 96.6°F | Resp 16 | Ht 62.0 in | Wt 110.8 lb

## 2021-07-04 DIAGNOSIS — E034 Atrophy of thyroid (acquired): Secondary | ICD-10-CM | POA: Diagnosis not present

## 2021-07-04 DIAGNOSIS — F0280 Dementia in other diseases classified elsewhere without behavioral disturbance: Secondary | ICD-10-CM | POA: Diagnosis not present

## 2021-07-04 DIAGNOSIS — F320 Major depressive disorder, single episode, mild: Secondary | ICD-10-CM | POA: Diagnosis not present

## 2021-07-04 DIAGNOSIS — R5383 Other fatigue: Secondary | ICD-10-CM | POA: Diagnosis not present

## 2021-07-04 DIAGNOSIS — M1712 Unilateral primary osteoarthritis, left knee: Secondary | ICD-10-CM

## 2021-07-04 DIAGNOSIS — H8111 Benign paroxysmal vertigo, right ear: Secondary | ICD-10-CM | POA: Diagnosis not present

## 2021-07-04 DIAGNOSIS — G309 Alzheimer's disease, unspecified: Secondary | ICD-10-CM | POA: Diagnosis not present

## 2021-07-04 DIAGNOSIS — E538 Deficiency of other specified B group vitamins: Secondary | ICD-10-CM

## 2021-07-04 DIAGNOSIS — F0150 Vascular dementia without behavioral disturbance: Secondary | ICD-10-CM

## 2021-07-04 LAB — CBC WITH DIFFERENTIAL/PLATELET
Basophils Absolute: 0.1 10*3/uL (ref 0.0–0.1)
Basophils Relative: 0.9 % (ref 0.0–3.0)
Eosinophils Absolute: 0.1 10*3/uL (ref 0.0–0.7)
Eosinophils Relative: 1.8 % (ref 0.0–5.0)
HCT: 40.7 % (ref 36.0–46.0)
Hemoglobin: 13.6 g/dL (ref 12.0–15.0)
Lymphocytes Relative: 42.2 % (ref 12.0–46.0)
Lymphs Abs: 2.6 10*3/uL (ref 0.7–4.0)
MCHC: 33.3 g/dL (ref 30.0–36.0)
MCV: 94.7 fl (ref 78.0–100.0)
Monocytes Absolute: 0.6 10*3/uL (ref 0.1–1.0)
Monocytes Relative: 10.2 % (ref 3.0–12.0)
Neutro Abs: 2.8 10*3/uL (ref 1.4–7.7)
Neutrophils Relative %: 44.9 % (ref 43.0–77.0)
Platelets: 267 10*3/uL (ref 150.0–400.0)
RBC: 4.3 Mil/uL (ref 3.87–5.11)
RDW: 14.1 % (ref 11.5–15.5)
WBC: 6.2 10*3/uL (ref 4.0–10.5)

## 2021-07-04 LAB — COMPREHENSIVE METABOLIC PANEL
ALT: 14 U/L (ref 0–35)
AST: 18 U/L (ref 0–37)
Albumin: 3.9 g/dL (ref 3.5–5.2)
Alkaline Phosphatase: 80 U/L (ref 39–117)
BUN: 20 mg/dL (ref 6–23)
CO2: 29 mEq/L (ref 19–32)
Calcium: 9.6 mg/dL (ref 8.4–10.5)
Chloride: 105 mEq/L (ref 96–112)
Creatinine, Ser: 1.21 mg/dL — ABNORMAL HIGH (ref 0.40–1.20)
GFR: 40.12 mL/min — ABNORMAL LOW (ref >60.00)
Glucose, Bld: 70 mg/dL (ref 70–99)
Potassium: 4.4 mEq/L (ref 3.5–5.1)
Sodium: 141 mEq/L (ref 135–145)
Total Bilirubin: 0.3 mg/dL (ref 0.2–1.2)
Total Protein: 6.7 g/dL (ref 6.0–8.3)

## 2021-07-04 NOTE — Patient Instructions (Addendum)
You appear to be doing very well!  I'm glad you have gained weight.   Ask [name] to replace your sugar free gum with regular gum. (Artificial sweeteners can cause diarrhea)

## 2021-07-04 NOTE — Assessment & Plan Note (Signed)
Weight loss,  Fatigue have improved with remeron.  No changes today

## 2021-07-04 NOTE — Assessment & Plan Note (Signed)
Recurrent,  Managed with Epley maneuver prn by ENT

## 2021-07-04 NOTE — Assessment & Plan Note (Signed)
Continue use of rolling  walker in home and footed cane when out with daughters.  Shower bench  Raised commode all employed

## 2021-07-04 NOTE — Progress Notes (Signed)
Subjective:  Patient ID: Emily Wu, female    DOB: 12/12/1930  Age: 87 y.o. MRN: [id number]  CC: The primary encounter diagnosis was Hypothyroidism due to acquired atrophy of thyroid. Diagnoses of Fatigue, unspecified type, Primary localized osteoarthritis of left knee, Mixed Alzheimer's and vascular dementia ([place]), Benign positional vertigo, right, B12 deficiency, and Current mild episode of major depressive disorder, unspecified whether recurrent ([place]) were also pertinent to this visit.  HPI Emily Wu presents for follow up on multiple issues    This visit occurred during the SARS-CoV-2 public health emergency.  Safety protocols were in place, including screening questions prior to the visit, additional usage of staff PPE, and extensive cleaning of exam room while observing appropriate contact time as indicated for disinfecting solutions.   87 yr old female with history of vascular dementia,  frequent falls,  low back pain and knee pain.  Using rolling walker at home.  [place] last week  while walking unassisted by walker from tiled kitchen to carpeted dining room.  Spilled food,   Broke a large flower pot,  but was not injured.  has a daytime aide from 8 an to 2 pm.  The fall occurred at 5 pm  . Did not use her life alert bc she was able to call  daughter [name] and get up on her own.   Recurrent vertigo x 2;  had ENT evaluation and Epley maneuver helped.  Weight loss resolved,  home weights are stable at 120 lbs.  Sleeping "wonderfully."  Bowels get loose if she goes out,  has fecal incontinence .thinks it's related to what she eats, but does not occur daily or when at home.  Wears a pad when she leaves home and takes imodium if going out. . Not occurring daily . Does not take anything for constipation.  Chews sugar free gum after every meal  Has a shower bench,  raised  commode with handles ,  rolling seated walker , footed cane.         Outpatient Medications Prior to  Visit  Medication Sig Dispense Refill   acetaminophen (TYLENOL) 650 MG CR tablet Take 650 mg by mouth every 8 (eight) hours as needed for pain.      benzonatate (TESSALON) 200 MG capsule Take 1 capsule (200 mg total) by mouth 3 (three) times daily as needed for cough. 60 capsule 1   diclofenac sodium (VOLTAREN) 1 % GEL Apply 2 g topically 4 (four) times daily as needed (for knee pain). 200 g 11   docusate sodium (COLACE) 100 MG capsule Take 100 mg by mouth 2 (two) times daily.     donepezil (ARICEPT) 5 MG tablet Take 1 tablet (5 mg total) by mouth at bedtime. 90 tablet 1   famotidine (PEPCID) 20 MG tablet Take 1 tablet (20 mg total) by mouth daily. 30 tablet 0   liothyronine (CYTOMEL) 5 MCG tablet TAKE ONE TABLET EVERY DAY 90 tablet 1   meclizine (ANTIVERT) 25 MG tablet Take 25 mg by mouth 2 (two) times daily as needed.     mirtazapine (REMERON) 30 MG tablet TAKE ONE TABLET (30 MG) BY MOUTH AT BEDTIME 90 tablet 1   Multiple Vitamins-Minerals (CENTRUM SILVER PO) Take 1 tablet by mouth daily.     polyethylene glycol (MIRALAX / GLYCOLAX) packet Take 17 g by mouth daily as needed for moderate constipation.      vitamin B-12 (CYANOCOBALAMIN) 1000 MCG tablet Take 1,000 mcg by mouth daily.  No facility-administered medications prior to visit.    Review of Systems;  Patient denies headache, fevers, malaise, unintentional weight loss, skin rash, eye pain, sinus congestion and sinus pain, sore throat, dysphagia,  hemoptysis , cough, dyspnea, wheezing, chest pain, palpitations, orthopnea, edema, abdominal pain, nausea, melena, diarrhea, constipation, flank pain, dysuria, hematuria, urinary  Frequency, nocturia, numbness, tingling, seizures,  Focal weakness, Loss of consciousness,  Tremor, insomnia, depression, anxiety, and suicidal ideation.      Objective:  BP (!) 136/54 (BP Location: Left Arm, Patient Position: Sitting, Cuff Size: Normal)   Pulse 65   Temp (!) 96.6 F (35.9 C) (Temporal)    Resp 16   Ht '5\' 2"'$  (1.575 m)   Wt 110 lb 12.8 oz (50.3 kg)   SpO2 98%   BMI 20.27 kg/m   BP Readings from Last 3 Encounters:  07/04/21 (!) 136/54  04/26/21 138/62  12/17/20 106/60    Wt Readings from Last 3 Encounters:  07/04/21 110 lb 12.8 oz (50.3 kg)  06/06/21 117 lb (53.1 kg)  04/26/21 117 lb (53.1 kg)    General appearance: alert, cooperative and appears stated age Ears: normal TM's and external ear canals both ears Throat: lips, mucosa, and tongue normal; teeth and gums normal Neck: no adenopathy, no carotid bruit, supple, symmetrical, trachea midline and thyroid not enlarged, symmetric, no tenderness/mass/nodules Back: symmetric, no curvature. ROM normal. No CVA tenderness. Lungs: clear to auscultation bilaterally Heart: regular rate and rhythm, S1, S2 normal, no murmur, click, rub or gallop Abdomen: soft, non-tender; bowel sounds normal; no masses,  no organomegaly Pulses: 2+ and symmetric Skin: Skin color, texture, turgor normal. No rashes or lesions Lymph nodes: Cervical, supraclavicular, and axillary nodes normal.  Lab Results  Component Value Date   HGBA1C 5.8 08/18/2019    Lab Results  Component Value Date   CREATININE 0.93 07/28/2020   CREATININE 1.02 (H) 07/27/2020   CREATININE 0.74 07/26/2020    Lab Results  Component Value Date   WBC 6.6 07/28/2020   HGB 12.1 07/28/2020   HCT 37.0 07/28/2020   PLT 337 07/28/2020   GLUCOSE 95 07/28/2020   CHOL 161 08/18/2019   TRIG 206.0 (H) 08/18/2019   HDL 41.00 08/18/2019   LDLDIRECT 90.0 08/18/2019   LDLCALC 73 03/19/2018   ALT 22 07/28/2020   AST 27 07/28/2020   NA 137 07/28/2020   K 4.0 07/28/2020   CL 101 07/28/2020   CREATININE 0.93 07/28/2020   BUN 15 07/28/2020   CO2 28 07/28/2020   TSH 3.22 10/30/2020   INR 1.04 12/08/2018   HGBA1C 5.8 08/18/2019    MR Brain Wo Contrast  Result Date: 10/01/2020 CLINICAL DATA:  Mental status change in weight loss after hospitalization for COVID and March  2021. Dementia, vascular cause suspected EXAM: MRI HEAD WITHOUT CONTRAST TECHNIQUE: Multiplanar, multiecho pulse sequences of the brain and surrounding structures were obtained without intravenous contrast. COMPARISON:  03/02/2020 head CT FINDINGS: Brain: Less than typical chronic small vessel ischemic change for age. Normal brain volume for age. No infarct, hemorrhage, hydrocephalus, or collection. No masslike finding. Vascular: Normal flow voids Skull and upper cervical spine: Normal marrow signal. Degenerative facet spurring asymmetric to the left. Sinuses/Orbits: Negative IMPRESSION: Age normal brain MRI. Electronically Signed   By: Monte Fantasia M.D.   On: 10/01/2020 05:23    Assessment & Plan:   Problem List Items Addressed This Visit       Unprioritized   Primary localized osteoarthritis of left knee  Continue use of rolling  walker in home and footed cane when out with daughters.  Shower bench  Raised commode all employed       Hypothyroidism - Primary   Relevant Orders   Thyroid Panel With TSH   Depression    Weight loss,  Fatigue have improved with remeron.  No changes today       B12 deficiency    IF Antibody negative.  Continue orally supplementing      Benign positional vertigo, right    Recurrent,  Managed with Epley maneuver prn by ENT       Mixed Alzheimer's and vascular dementia ([place])    No progression based on today's exam.  Continue all medications,  Daytime health aide      Other Visit Diagnoses     Fatigue, unspecified type       Relevant Orders   CBC with Differential/Platelet   Comprehensive metabolic panel      I provided  30 minutes  Was spent  reviewing patient's current problems and recent encounters with his specialists, reviewing and ordering  labs and imaging studies, providing counseling on her frequent falls and depression,  and evaluating patient  In a face to face visit  .   No orders of the defined types were placed in this  encounter.   There are no discontinued medications.  Follow-up: No follow-ups on file.   Crecencio Mc, MD

## 2021-07-04 NOTE — Assessment & Plan Note (Signed)
IF Antibody negative.  Continue orally supplementing

## 2021-07-04 NOTE — Assessment & Plan Note (Signed)
No progression based on today's exam.  Continue all medications,  Daytime health aide

## 2021-07-05 LAB — THYROID PANEL WITH TSH
Free Thyroxine Index: 1.3 — ABNORMAL LOW (ref 1.4–3.8)
T3 Uptake: 28 % (ref 22–35)
T4, Total: 4.8 ug/dL — ABNORMAL LOW (ref 5.1–11.9)
TSH: 3.48 mIU/L (ref 0.40–4.50)

## 2021-07-08 DIAGNOSIS — R42 Dizziness and giddiness: Secondary | ICD-10-CM | POA: Diagnosis not present

## 2021-07-15 DIAGNOSIS — F5104 Psychophysiologic insomnia: Secondary | ICD-10-CM | POA: Diagnosis not present

## 2021-07-15 DIAGNOSIS — G309 Alzheimer's disease, unspecified: Secondary | ICD-10-CM | POA: Diagnosis not present

## 2021-07-15 DIAGNOSIS — F0280 Dementia in other diseases classified elsewhere without behavioral disturbance: Secondary | ICD-10-CM | POA: Diagnosis not present

## 2021-07-15 DIAGNOSIS — E039 Hypothyroidism, unspecified: Secondary | ICD-10-CM | POA: Diagnosis not present

## 2021-07-15 DIAGNOSIS — F0150 Vascular dementia without behavioral disturbance: Secondary | ICD-10-CM | POA: Diagnosis not present

## 2021-07-15 DIAGNOSIS — Z8616 Personal history of COVID-19: Secondary | ICD-10-CM | POA: Diagnosis not present

## 2021-07-22 ENCOUNTER — Other Ambulatory Visit: Payer: Self-pay

## 2021-07-22 ENCOUNTER — Other Ambulatory Visit: Payer: Medicare Other

## 2021-07-22 DIAGNOSIS — Z515 Encounter for palliative care: Secondary | ICD-10-CM

## 2021-07-22 NOTE — Progress Notes (Signed)
PATIENT NAME: Emily Wu DOB: [birth year] MRN: [id number]  PRIMARY CARE PROVIDER: Crecencio Mc, MD  RESPONSIBLE PARTY:  Acct ID - Guarantor Home Phone Work Phone Relationship Acct Type  [id number] BETSI, [id number]  Self P/F     [address], [place], [place] [address]  TELEHEALTH VISIT STATEMENT Due to the COVID-19 crisis, this visit was done via telemedicine from my office and it was initiated and consent by this patient and or family.  PLAN OF CARE and INTERVENTIONS:               1.  GOALS OF CARE/ ADVANCE CARE PLANNING:  Remain home under the care of her daughters and a Charity fundraiser.               2.  PATIENT/CAREGIVER EDUCATION:  Safety               4. PERSONAL EMERGENCY PLAN:  Activate 911 for emergencies.               5.  DISEASE STATUS:  Phone call made to daughter Emily Wu to follow up on patient's overall condition.  Patient was recently seen by Neurology due to worsening short term memory.  Daughter noted patient's memory with finances has been especially noticeable.  Daughter is managing finances at this time.  Aricept was increased to 10 mg daily.  Daughter noted patient's mental exam improved from her last visit.  This was likely due to patient having COVID prior to the 1st neurology appointment.   Fit Bit has been obtained as recommended.  [id number] steps were encouraged and patient believes she is getting her steps in on a regular basis.  Fit Bit will also be used to monitor patient's heart rate with increase in Aricept.   Patient continues to have a private caregiver but this will only be for another 3 months.  Current private sitter will be moving out of town.  Family will work with agency to possibly obtain another sitter for patient.  At this time, sitter is with patient from 8 am- 2 pm.  Daughters are assisting in the evenings.  Patient is using a walker in the home.  She has not had any falls but did trip in the kitchen.  No injuries have been noted.  Patient  does not usually take her walker when she is outside of the home.  Family continue to encourage patient to keep this with her for safety.  Patient is at high risk for falls.  Patient often leans forward and looks at the ground when walking.  Patient continues with a knee and back brace.  She will be following up with orthopedics regarding cortisone shots to her left knee.  Daughter does not wish for patient to have a knee replacement for fear her cognitive function would worsen.  Patient is having issues with her bowels.  Daughter is concerned patient may have IBS.  She will follow up with GI this Friday for further evaluation.  Patient is having some incontinent issues and taking an anti-diarrhea occasionally.  Advised daughter that Palliative Care would do a home visit next month and update her with any new concerns.   HISTORY OF PRESENT ILLNESS:  87 year old female with Dementia.  Patient is being followed by Palliative Care monthly and PRN.  CODE STATUS: Full ADVANCED DIRECTIVES: Yes MOST FORM: No PPS: 50%       Lorenza Burton, RN

## 2021-07-26 DIAGNOSIS — M1711 Unilateral primary osteoarthritis, right knee: Secondary | ICD-10-CM | POA: Diagnosis not present

## 2021-08-07 DIAGNOSIS — D485 Neoplasm of uncertain behavior of skin: Secondary | ICD-10-CM | POA: Diagnosis not present

## 2021-08-07 DIAGNOSIS — C44329 Squamous cell carcinoma of skin of other parts of face: Secondary | ICD-10-CM | POA: Diagnosis not present

## 2021-08-07 DIAGNOSIS — L821 Other seborrheic keratosis: Secondary | ICD-10-CM | POA: Diagnosis not present

## 2021-08-07 DIAGNOSIS — D225 Melanocytic nevi of trunk: Secondary | ICD-10-CM | POA: Diagnosis not present

## 2021-08-21 DIAGNOSIS — R197 Diarrhea, unspecified: Secondary | ICD-10-CM | POA: Diagnosis not present

## 2021-08-28 ENCOUNTER — Telehealth: Payer: Self-pay | Admitting: Internal Medicine

## 2021-08-28 DIAGNOSIS — M545 Low back pain, unspecified: Secondary | ICD-10-CM | POA: Diagnosis not present

## 2021-08-28 DIAGNOSIS — M546 Pain in thoracic spine: Secondary | ICD-10-CM | POA: Diagnosis not present

## 2021-08-28 DIAGNOSIS — R944 Abnormal results of kidney function studies: Secondary | ICD-10-CM

## 2021-08-28 NOTE — Telephone Encounter (Signed)
Spoke with pt and she has been scheduled for 09/11/2021.

## 2021-08-28 NOTE — Telephone Encounter (Signed)
I received a request for preoperative clearance  for patient from orthopedics.  Needs appt to do this  and this patient needs a BMET PRIOR to appt and an EKG at visit.

## 2021-09-04 DIAGNOSIS — L905 Scar conditions and fibrosis of skin: Secondary | ICD-10-CM | POA: Diagnosis not present

## 2021-09-04 DIAGNOSIS — C44329 Squamous cell carcinoma of skin of other parts of face: Secondary | ICD-10-CM | POA: Diagnosis not present

## 2021-09-09 ENCOUNTER — Telehealth: Payer: Self-pay

## 2021-09-09 NOTE — Telephone Encounter (Signed)
1035 am.  Phone call made to patient to schedule a home visit.  No answer but message has been left requesting a call back.

## 2021-09-11 ENCOUNTER — Other Ambulatory Visit: Payer: Self-pay

## 2021-09-11 ENCOUNTER — Encounter: Payer: Self-pay | Admitting: Internal Medicine

## 2021-09-11 ENCOUNTER — Ambulatory Visit (INDEPENDENT_AMBULATORY_CARE_PROVIDER_SITE_OTHER): Payer: Medicare Other | Admitting: Internal Medicine

## 2021-09-11 ENCOUNTER — Ambulatory Visit (INDEPENDENT_AMBULATORY_CARE_PROVIDER_SITE_OTHER): Payer: Medicare Other

## 2021-09-11 VITALS — BP 140/62 | HR 72 | Temp 97.8°F | Ht 62.0 in | Wt 125.0 lb

## 2021-09-11 DIAGNOSIS — G8929 Other chronic pain: Secondary | ICD-10-CM | POA: Diagnosis not present

## 2021-09-11 DIAGNOSIS — Z01818 Encounter for other preprocedural examination: Secondary | ICD-10-CM | POA: Diagnosis not present

## 2021-09-11 DIAGNOSIS — I700 Atherosclerosis of aorta: Secondary | ICD-10-CM

## 2021-09-11 DIAGNOSIS — R7303 Prediabetes: Secondary | ICD-10-CM

## 2021-09-11 DIAGNOSIS — M25561 Pain in right knee: Secondary | ICD-10-CM

## 2021-09-11 DIAGNOSIS — F413 Other mixed anxiety disorders: Secondary | ICD-10-CM | POA: Diagnosis not present

## 2021-09-11 DIAGNOSIS — I447 Left bundle-branch block, unspecified: Secondary | ICD-10-CM

## 2021-09-11 DIAGNOSIS — N1831 Chronic kidney disease, stage 3a: Secondary | ICD-10-CM | POA: Diagnosis not present

## 2021-09-11 DIAGNOSIS — R5383 Other fatigue: Secondary | ICD-10-CM | POA: Diagnosis not present

## 2021-09-11 DIAGNOSIS — E034 Atrophy of thyroid (acquired): Secondary | ICD-10-CM | POA: Diagnosis not present

## 2021-09-11 LAB — COMPREHENSIVE METABOLIC PANEL
ALT: 16 U/L (ref 0–35)
AST: 18 U/L (ref 0–37)
Albumin: 4.1 g/dL (ref 3.5–5.2)
Alkaline Phosphatase: 78 U/L (ref 39–117)
BUN: 16 mg/dL (ref 6–23)
CO2: 29 mEq/L (ref 19–32)
Calcium: 10.1 mg/dL (ref 8.4–10.5)
Chloride: 103 mEq/L (ref 96–112)
Creatinine, Ser: 1.14 mg/dL (ref 0.40–1.20)
GFR: 43.04 mL/min — ABNORMAL LOW (ref >60.00)
Glucose, Bld: 85 mg/dL (ref 70–99)
Potassium: 4.8 mEq/L (ref 3.5–5.1)
Sodium: 139 mEq/L (ref 135–145)
Total Bilirubin: 0.4 mg/dL (ref 0.2–1.2)
Total Protein: 6.5 g/dL (ref 6.0–8.3)

## 2021-09-11 LAB — HEMOGLOBIN A1C: Hgb A1c MFr Bld: 5.7 % (ref 4.6–6.5)

## 2021-09-11 MED ORDER — ROSUVASTATIN CALCIUM 10 MG PO TABS
10.0000 mg | ORAL_TABLET | Freq: Every day | ORAL | 5 refills | Status: DC
Start: 2021-09-11 — End: 2022-01-27

## 2021-09-11 NOTE — Assessment & Plan Note (Signed)
Managed with cytomel.  Last TSH was normal in August 2022  Lab Results  Component Value Date   TSH 3.48 07/04/2021

## 2021-09-11 NOTE — Assessment & Plan Note (Addendum)
I have ordered and reviewed a 12 lead EKG and find that there are no acute changes except for mild sinus bradycardia (likely due to Aricept)   She has a chronic LBBB.  Chest x ray and labs are pending

## 2021-09-11 NOTE — Assessment & Plan Note (Signed)
Secondary to severe OA>  For partial knee replacement I n Jan 2023

## 2021-09-11 NOTE — Assessment & Plan Note (Addendum)
GFR is stable but < 50 ml/min

## 2021-09-11 NOTE — Assessment & Plan Note (Signed)
Today she testified (voluntarily)  that she has been incredibly blessed by God with an abundance of everythign and has no worries.

## 2021-09-11 NOTE — Patient Instructions (Signed)
Your chest xray done last year showed that you have placque buildup in your aorta,  This is called "atherosclerosis"  Your aortic atherosclerosis places you at increased risk for heart attack and stroke, because the placque can rupture and break off and block a small artery .  Crestor has been shown to prevent placque rupture if taken regularly.   I recommend that you repeat a trial of Crestor  and I have sent it to your pharmacy.  If you decide to start it,  please return to our lab in  3 weeks TO RECHECK YOUR LIVER ENZYMES

## 2021-09-11 NOTE — Progress Notes (Addendum)
Subjective:  Patient ID: Emily Wu, female    DOB: 1930-12-26  Age: 87 y.o. MRN: [id number]  CC: The primary encounter diagnosis was Fatigue, unspecified type. Diagnoses of Preoperative evaluation of a medical condition to rule out surgical contraindications (TAR required), Prediabetes, Encounter for preoperative assessment, Stage 3a chronic kidney disease ([place]), Left bundle branch block (LBBB) determined by electrocardiography, Hypothyroidism due to acquired atrophy of thyroid, Aortic atherosclerosis ([place]), Other mixed anxiety disorders, and Chronic pain of right knee were also pertinent to this visit.  HPI MARCHELE Wu presents for preoperative clearance for a partial right knee replacement by Dr Mardelle Matte at [facility] It is unclear what type of anesthesia will be used.  The procedure is tentatively planned for Jan 2023.   This visit occurred during the SARS-CoV-2 public health emergency.  Safety protocols were in place, including screening questions prior to the visit, additional usage of staff PPE, and extensive cleaning of exam room while observing appropriate contact time as indicated for disinfecting solutions.   [name] is an 87 yr old female with a history of aortic atherosclerosis,  left bundle branch block by EKG, CKD stage 3,  and mixed alzheimers/vascular dementia  with severe OA right knee that is activity limiting due to constant pain.   She had a normal ECHO and myoview in Jan 2020 which was done due to preoperative finding of LBBB.  She denies a history of chest pain or exertional dyspnea.   She has mild CKD and hypothyroidism treated with Cytomel.   She has prediabetes based on a prior a1c of 5.8.  She has a history of moderate protein malnutrition brought on by grief over the loss of her husband, followed by severe system illness,  but has regained weight and made a full recovery.  She lives independently in a one story house and has 2 supportive daughters who  liver nearby and a daytime aide      Outpatient Medications Prior to Visit  Medication Sig Dispense Refill   acetaminophen (TYLENOL) 650 MG CR tablet Take 650 mg by mouth every 8 (eight) hours as needed for pain.      benzonatate (TESSALON) 200 MG capsule Take 1 capsule (200 mg total) by mouth 3 (three) times daily as needed for cough. 60 capsule 1   diclofenac sodium (VOLTAREN) 1 % GEL Apply 2 g topically 4 (four) times daily as needed (for knee pain). 200 g 11   docusate sodium (COLACE) 100 MG capsule Take 100 mg by mouth 2 (two) times daily.     donepezil (ARICEPT) 5 MG tablet Take 1 tablet (5 mg total) by mouth at bedtime. 90 tablet 1   famotidine (PEPCID) 20 MG tablet Take 1 tablet (20 mg total) by mouth daily. 30 tablet 0   liothyronine (CYTOMEL) 5 MCG tablet TAKE ONE TABLET EVERY DAY 90 tablet 1   meclizine (ANTIVERT) 25 MG tablet Take 25 mg by mouth 2 (two) times daily as needed.     mirtazapine (REMERON) 30 MG tablet TAKE ONE TABLET (30 MG) BY MOUTH AT BEDTIME 90 tablet 1   Multiple Vitamins-Minerals (CENTRUM SILVER PO) Take 1 tablet by mouth daily.     polyethylene glycol (MIRALAX / GLYCOLAX) packet Take 17 g by mouth daily as needed for moderate constipation.      vitamin B-12 (CYANOCOBALAMIN) 1000 MCG tablet Take 1,000 mcg by mouth daily.     No facility-administered medications prior to visit.    Review of Systems;  Patient denies headache, fevers, malaise, unintentional weight loss, skin rash, eye pain, sinus congestion and sinus pain, sore throat, dysphagia,  hemoptysis , cough, dyspnea, wheezing, chest pain, palpitations, orthopnea, edema, abdominal pain, nausea, melena, diarrhea, constipation, flank pain, dysuria, hematuria, urinary  Frequency, nocturia, numbness, tingling, seizures,  Focal weakness, Loss of consciousness,  Tremor, insomnia, depression, anxiety, and suicidal ideation.      Objective:  BP 140/62   Pulse 72   Temp 97.8 F (36.6 C) (Skin)   Ht 5\' 2"   (1.575 m)   Wt 125 lb (56.7 kg)   SpO2 96%   BMI 22.86 kg/m   BP Readings from Last 3 Encounters:  09/11/21 140/62  07/04/21 (!) 136/54  04/26/21 138/62    Wt Readings from Last 3 Encounters:  09/11/21 125 lb (56.7 kg)  07/04/21 110 lb 12.8 oz (50.3 kg)  06/06/21 117 lb (53.1 kg)    General appearance: alert, cooperative and appears stated age Ears: normal TM's and external ear canals both ears Throat: lips, mucosa, and tongue normal; teeth and gums normal Neck: no adenopathy, no carotid bruit, supple, symmetrical, trachea midline and thyroid not enlarged, symmetric, no tenderness/mass/nodules Back: symmetric, no curvature. ROM normal. No CVA tenderness. Lungs: clear to auscultation bilaterally Heart: regular rate and rhythm, S1, S2 normal, no murmur, click, rub or gallop Abdomen: soft, non-tender; bowel sounds normal; no masses,  no organomegaly Pulses: 2+ and symmetric Skin: Skin color, texture, turgor normal. No rashes or lesions Lymph nodes: Cervical, supraclavicular, and axillary nodes normal.  Lab Results  Component Value Date   HGBA1C 5.7 09/11/2021   HGBA1C 5.8 08/18/2019    Lab Results  Component Value Date   CREATININE 1.14 09/11/2021   CREATININE 1.21 (H) 07/04/2021   CREATININE 0.93 07/28/2020    Lab Results  Component Value Date   WBC 6.2 07/04/2021   HGB 13.6 07/04/2021   HCT 40.7 07/04/2021   PLT 267.0 07/04/2021   GLUCOSE 85 09/11/2021   CHOL 161 08/18/2019   TRIG 206.0 (H) 08/18/2019   HDL 41.00 08/18/2019   LDLDIRECT 90.0 08/18/2019   LDLCALC 73 03/19/2018   ALT 16 09/11/2021   AST 18 09/11/2021   NA 139 09/11/2021   K 4.8 09/11/2021   CL 103 09/11/2021   CREATININE 1.14 09/11/2021   BUN 16 09/11/2021   CO2 29 09/11/2021   TSH 3.48 07/04/2021   INR 1.04 12/08/2018   HGBA1C 5.7 09/11/2021    MR Brain Wo Contrast  Result Date: 10/01/2020 CLINICAL DATA:  Mental status change in weight loss after hospitalization for COVID and March  2021. Dementia, vascular cause suspected EXAM: MRI HEAD WITHOUT CONTRAST TECHNIQUE: Multiplanar, multiecho pulse sequences of the brain and surrounding structures were obtained without intravenous contrast. COMPARISON:  03/02/2020 head CT FINDINGS: Brain: Less than typical chronic small vessel ischemic change for age. Normal brain volume for age. No infarct, hemorrhage, hydrocephalus, or collection. No masslike finding. Vascular: Normal flow voids Skull and upper cervical spine: Normal marrow signal. Degenerative facet spurring asymmetric to the left. Sinuses/Orbits: Negative IMPRESSION: Age normal brain MRI. Electronically Signed   By: Monte Fantasia M.D.   On: 10/01/2020 05:23    Assessment & Plan:   Problem List Items Addressed This Visit     Hypothyroidism    Managed with cytomel.  Last TSH was normal in August 2022  Lab Results  Component Value Date   TSH 3.48 07/04/2021         Anxiety disorder  Today she testified (voluntarily)  that she has been incredibly blessed by God with an abundance of everythign and has no worries.       Left bundle branch block (LBBB) determined by electrocardiography    Incidental finding during 2020 preoperative evaluation  She had a noninasive evaluation by Dr Ubaldo Glassing . ECHO was normal (ef > 50%) and nuc med myoview was a "low risk study."       Aortic atherosclerosis ([place])     Aortic atherosclerosis :  Discussed need for statin therapy given documented evidence of moderate  atherosclerosis in the thoracic  aorta noted on July 2021 chest x ray  and the prognostic implications of this finding.  She is willing to start Crestor ,  rx sent to pharmacy . Advised to return in 3 weeks for CMET       Relevant Medications   rosuvastatin (CRESTOR) 10 MG tablet   Chronic pain of right knee    Secondary to severe OA>  For partial knee replacement I n Jan 2023      Chronic kidney disease (CKD), stage III (moderate)    GFR is stable but < 50 ml/min       Relevant Orders   Comprehensive metabolic panel   Encounter for preoperative assessment    I have ordered and reviewed a 12 lead EKG and find that there are no acute changes except for mild sinus bradycardia (likely due to Aricept)   She has a chronic LBBB.  Chest x ray and labs are pending       Relevant Orders   DG Chest 2 View (Completed)   Other Visit Diagnoses     Fatigue, unspecified type    -  Primary   Relevant Orders   EKG 12-Lead (Completed)   Preoperative evaluation of a medical condition to rule out surgical contraindications (TAR required)       Relevant Orders   Comprehensive metabolic panel (Completed)   Prediabetes       Relevant Orders   Hemoglobin A1c (Completed)       I provided  40 minutes  during this encounter reviewing patient's current problems and past surgeries, labs and imaging studies, providing counseling on the above mentioned problems I na face to face visit  , and coordination  of care .  Meds ordered this encounter  Medications   rosuvastatin (CRESTOR) 10 MG tablet    Sig: Take 1 tablet (10 mg total) by mouth daily.    Dispense:  30 tablet    Refill:  5    There are no discontinued medications.  Follow-up: No follow-ups on file.   Crecencio Mc, MD

## 2021-09-11 NOTE — Assessment & Plan Note (Addendum)
  Aortic atherosclerosis :  Discussed need for statin therapy given documented evidence of moderate  atherosclerosis in the thoracic  aorta noted on July 2021 chest x ray  and the prognostic implications of this finding.  She is willing to start Crestor ,  rx sent to pharmacy . Advised to return in 3 weeks for CMET

## 2021-09-11 NOTE — Assessment & Plan Note (Signed)
Incidental finding during 2020 preoperative evaluation  She had a noninasive evaluation by Dr Ubaldo Glassing . ECHO was normal (ef > 50%) and nuc med myoview was a "low risk study."

## 2021-09-17 DIAGNOSIS — M545 Low back pain, unspecified: Secondary | ICD-10-CM | POA: Diagnosis not present

## 2021-09-17 DIAGNOSIS — M542 Cervicalgia: Secondary | ICD-10-CM | POA: Diagnosis not present

## 2021-09-19 DIAGNOSIS — M5402 Panniculitis affecting regions of neck and back, cervical region: Secondary | ICD-10-CM | POA: Diagnosis not present

## 2021-09-23 ENCOUNTER — Other Ambulatory Visit: Payer: Self-pay

## 2021-09-23 ENCOUNTER — Other Ambulatory Visit: Payer: Medicare Other

## 2021-09-23 DIAGNOSIS — Z515 Encounter for palliative care: Secondary | ICD-10-CM

## 2021-09-23 NOTE — Progress Notes (Signed)
PATIENT NAME: Emily Wu DOB: 02-26-1931 MRN: [id number]  PRIMARY CARE PROVIDER: Crecencio Mc, MD  RESPONSIBLE PARTY:  Acct ID - Guarantor Home Phone Work Phone Relationship Acct Type  [id number] Emily Wu, Emily [phone]  Self P/F     [address], [place], [place] [address]   Due to the COVID-19 crisis, this visit was done via telemedicine from my office and it was initiated and consent by this patient and or family.  I connected with  Konrad Felix OR PROXY on 09/23/21 telephonically and verified that I am speaking with the correct person using two identifiers.   I discussed the limitations of evaluation and management by telemedicine. The patient expressed understanding and agreed to proceed.   PLAN OF CARE and INTERVENTIONS:               1.  GOALS OF CARE/ ADVANCE CARE PLANNING:  Remain home under the care of private caregivers.               2.  PATIENT/CAREGIVER EDUCATION:  Safety               4. PERSONAL EMERGENCY PLAN:  Activate 911 for emergencies.               5.  DISEASE STATUS:  Spoke with patient who provided and update on her overall condition.  Patient has been seen by GI due to ongoing diarrhea.  She reports her pancreas is not producing enzymes to break down her food properly and was started on Creon.  Since starting this medication diarrhea has resolved.  Patient feels her appetite is better and she is able to go out for meals again.   Patient continues to have private caregivers in the home from 8 am- 2 pm Mon-Fri.  Her previous caregiver is no longer with her but she feels the new caregiver is providing excellent care.  There is discussion of having additional care on Saturdays for 1/2 days.  Patient reports she will be having a total knee replacement in January.  She notes ongoing issues with her mobility and discomfort related to this knee.  Patient is feeling confident she will recover well with the care that she has in her home.   Patient also reports a  "pinched nerve" that will be addressed with a nerve block.  Patient denies issues with insomnia.  She reports sleeping for 12 hours during the night.  Current bedtime medication regimen is working well for her.   Discussed safety with patient.  She denies any falls and continues to use a rolling walker.  She has a bedside commode that she uses during the night.  Patient affirms she has a life alert.  Daughters continue to provide support to patient when private caregivers are not present.  Patient denies any new concerns at this time and is agreeable to continue with Palliative Care.   HISTORY OF PRESENT ILLNESS:  87 year old female with Dementia and CKD.  Patient is being followed by Palliative Care every 4-8 weeks and PRN.  CODE STATUS: Full ADVANCED DIRECTIVES: Yes MOST FORM: No PPS: 50%   Vance Gather, RN

## 2021-10-14 DIAGNOSIS — M5416 Radiculopathy, lumbar region: Secondary | ICD-10-CM | POA: Diagnosis not present

## 2021-10-15 ENCOUNTER — Telehealth: Payer: Self-pay | Admitting: Internal Medicine

## 2021-10-15 ENCOUNTER — Ambulatory Visit: Payer: Medicare Other | Admitting: Adult Health

## 2021-10-15 ENCOUNTER — Telehealth: Payer: Self-pay

## 2021-10-15 NOTE — Telephone Encounter (Addendum)
Placed call to pt to advise she be seen at ER due to symptoms per provider. Pt refused to go to ER and wanted treatment from provider in office. Informed pt she can still be seen in office for symptoms.

## 2021-10-15 NOTE — Telephone Encounter (Signed)
Pt called in regards to feeling faint and having "dizzy spells." Pt states it happened twice last week and yesterday 11/21. She starts to feel overheated then lightheaded. Pt also states she fainted one day last week when she became dizzy. Pt states she is unsure about how long she was down but she believes around 30 mins. Pt was sent to access nurse.

## 2021-10-15 NOTE — Telephone Encounter (Signed)
Saw NP today.

## 2021-10-15 NOTE — Telephone Encounter (Signed)
Tried to reach  patient to discuss need for going to ED no answer and no voicemail.

## 2021-10-21 DIAGNOSIS — Z20822 Contact with and (suspected) exposure to covid-19: Secondary | ICD-10-CM | POA: Diagnosis not present

## 2021-10-21 NOTE — Telephone Encounter (Signed)
Spoke with pt and she is scheduled for tomorrow at 12:15 pm.

## 2021-10-21 NOTE — Telephone Encounter (Signed)
Is there anywhere this week that you would like to work her for the dizzy spells that she has been having on and off for a week. Pt was advised to be seen at ED last week but refused and only wants to see her PCP.

## 2021-10-21 NOTE — Telephone Encounter (Signed)
Patient called office, she is refusing to go to the hospital. She states she would like to see Dr Derrel Nip. No available appointments.

## 2021-10-22 ENCOUNTER — Ambulatory Visit (INDEPENDENT_AMBULATORY_CARE_PROVIDER_SITE_OTHER): Payer: Medicare Other | Admitting: Internal Medicine

## 2021-10-22 ENCOUNTER — Encounter: Payer: Self-pay | Admitting: Internal Medicine

## 2021-10-22 ENCOUNTER — Other Ambulatory Visit: Payer: Self-pay

## 2021-10-22 VITALS — BP 166/64 | HR 78 | Temp 96.1°F | Ht 62.0 in | Wt 124.2 lb

## 2021-10-22 DIAGNOSIS — K8689 Other specified diseases of pancreas: Secondary | ICD-10-CM | POA: Diagnosis not present

## 2021-10-22 DIAGNOSIS — R350 Frequency of micturition: Secondary | ICD-10-CM

## 2021-10-22 DIAGNOSIS — R5383 Other fatigue: Secondary | ICD-10-CM | POA: Diagnosis not present

## 2021-10-22 DIAGNOSIS — R197 Diarrhea, unspecified: Secondary | ICD-10-CM | POA: Diagnosis not present

## 2021-10-22 DIAGNOSIS — K909 Intestinal malabsorption, unspecified: Secondary | ICD-10-CM

## 2021-10-22 DIAGNOSIS — K8681 Exocrine pancreatic insufficiency: Secondary | ICD-10-CM | POA: Insufficient documentation

## 2021-10-22 LAB — COMPREHENSIVE METABOLIC PANEL
ALT: 14 U/L (ref 0–35)
AST: 17 U/L (ref 0–37)
Albumin: 4 g/dL (ref 3.5–5.2)
Alkaline Phosphatase: 67 U/L (ref 39–117)
BUN: 15 mg/dL (ref 6–23)
CO2: 27 mEq/L (ref 19–32)
Calcium: 9.3 mg/dL (ref 8.4–10.5)
Chloride: 105 mEq/L (ref 96–112)
Creatinine, Ser: 1.13 mg/dL (ref 0.40–1.20)
GFR: 43.46 mL/min — ABNORMAL LOW (ref >60.00)
Glucose, Bld: 73 mg/dL (ref 70–99)
Potassium: 3.9 mEq/L (ref 3.5–5.1)
Sodium: 142 mEq/L (ref 135–145)
Total Bilirubin: 0.2 mg/dL (ref 0.2–1.2)
Total Protein: 6.3 g/dL (ref 6.0–8.3)

## 2021-10-22 LAB — URINALYSIS, ROUTINE W REFLEX MICROSCOPIC
Bilirubin Urine: NEGATIVE
Hgb urine dipstick: NEGATIVE
Ketones, ur: NEGATIVE
Leukocytes,Ua: NEGATIVE
Nitrite: NEGATIVE
Specific Gravity, Urine: 1.02 (ref 1.000–1.030)
Total Protein, Urine: NEGATIVE
Urine Glucose: NEGATIVE
Urobilinogen, UA: 0.2 (ref 0.0–1.0)
pH: 5 (ref 5.0–8.0)

## 2021-10-22 LAB — CBC WITH DIFFERENTIAL/PLATELET
Basophils Absolute: 0 10*3/uL (ref 0.0–0.1)
Basophils Relative: 0.5 % (ref 0.0–3.0)
Eosinophils Absolute: 0.1 10*3/uL (ref 0.0–0.7)
Eosinophils Relative: 1.3 % (ref 0.0–5.0)
HCT: 39.8 % (ref 36.0–46.0)
Hemoglobin: 13.1 g/dL (ref 12.0–15.0)
Lymphocytes Relative: 40.7 % (ref 12.0–46.0)
Lymphs Abs: 2.5 10*3/uL (ref 0.7–4.0)
MCHC: 33 g/dL (ref 30.0–36.0)
MCV: 95.4 fl (ref 78.0–100.0)
Monocytes Absolute: 0.9 10*3/uL (ref 0.1–1.0)
Monocytes Relative: 14.8 % — ABNORMAL HIGH (ref 3.0–12.0)
Neutro Abs: 2.6 10*3/uL (ref 1.4–7.7)
Neutrophils Relative %: 42.7 % — ABNORMAL LOW (ref 43.0–77.0)
Platelets: 255 10*3/uL (ref 150.0–400.0)
RBC: 4.17 Mil/uL (ref 3.87–5.11)
RDW: 13.6 % (ref 11.5–15.5)
WBC: 6.1 10*3/uL (ref 4.0–10.5)

## 2021-10-22 LAB — TSH: TSH: 2.64 u[IU]/mL (ref 0.35–5.50)

## 2021-10-22 LAB — MAGNESIUM: Magnesium: 2.2 mg/dL (ref 1.5–2.5)

## 2021-10-22 MED ORDER — IPRATROPIUM BROMIDE 0.06 % NA SOLN: 2.0000 | Freq: Four times a day (QID) | NASAL | 12 refills | Status: DC

## 2021-10-22 NOTE — Patient Instructions (Signed)
You appear to be dehydrated because of your recurrent diarrhea  Please GRADUALLY  increase your water intake to 60 ounces daily   Avoid sugar free beverages because they can aggravated diarrhea

## 2021-10-22 NOTE — Assessment & Plan Note (Signed)
Dr Earlean Shawl,  Her GI doctor has recently changed her medication from Creon to Colestipol due to continued nocturnal fecal incontinence due to diarrhea.

## 2021-10-22 NOTE — Assessment & Plan Note (Signed)
She is not orthostatic. Symptoms of profound fatigue occur mid day and resolve after an hour of napping,  suspect fatigue and dehdyration due to   Malabsorption diarrhea under treatment by Dr Earlean Shawl,  Checking lytes.  UA<  Etc,  Encouraged to  increase water intake to 60 ounces daily GRADUALLY

## 2021-10-22 NOTE — Progress Notes (Signed)
Subjective:  Patient ID: Emily Wu, female    DOB: Jan 13, 1931  Age: 87 y.o. MRN: [id number]  CC: The primary encounter diagnosis was Other fatigue. Diagnoses of Diarrhea due to malabsorption, Pancreatic insufficiency, and Urinary frequency were also pertinent to this visit.  HPI GLENA PHARRIS presents for  Chief Complaint  Patient presents with   Dizziness    Occurs mid morning, sits down for a while and it goes away and is fine the rest of the day.    87 yr old female with history of PAD with carotid stenosis,  AA,   B12 deficiency , and orthostasis presents with recurrent episodes of dizziness.  Episodes of extreme fatigue occur  daily around mid morning and resolve after resting for an hour.  Accompanied by a feeling of flushing (but not by diarrhea, chest pain, dyspnea  or vertigo)  The worst days were from Nov 17 to Nov 21,    and symptoms improved over the weekend while visiting with with family  (fewer  episodes ) but returned today.  Home BP has been checked during episode and was 124/57.  No falls. Currently under treatment for pancreatic insufficiency resulting in recurrent diarrhea  by Dr. Earlean Shawl with [facility],  but still having 6 loose stools daily and start after eating breakfast.  Also Having nocturnal fecal incontinence for the last several months.  Dr Earlean Shawl has added Colestipol as of yesterday ,  first dose was today, First solid stool occurred this morning . As of this morning she has stopped the Creon per Dr Liliane Channel instructions  Positional vital signs  done  today were negative for orthostatic changes.   No uti symptoms.    In the interim; she had  NERVE BLOCK Nov 21  in her left hip by [facility] orthopedist .  She had significant relief in her left leg pain .  Lipoma on left posterior neck not bothering her  Having her right knee replaced Jan 14th by Floyde Parkins (left knee was done 20 yrs ago by same)      Outpatient Medications Prior to Visit   Medication Sig Dispense Refill   acetaminophen (TYLENOL) 650 MG CR tablet Take 650 mg by mouth every 8 (eight) hours as needed for pain.      benzonatate (TESSALON) 200 MG capsule Take 1 capsule (200 mg total) by mouth 3 (three) times daily as needed for cough. 60 capsule 1   colestipol (COLESTID) 1 g tablet Take 1 g by mouth 2 (two) times daily.     CREON 36000-114000 units CPEP capsule Take 72,000 Units by mouth 3 (three) times daily.     diclofenac sodium (VOLTAREN) 1 % GEL Apply 2 g topically 4 (four) times daily as needed (for knee pain). 200 g 11   docusate sodium (COLACE) 100 MG capsule Take 100 mg by mouth 2 (two) times daily.     donepezil (ARICEPT) 5 MG tablet Take 1 tablet (5 mg total) by mouth at bedtime. 90 tablet 1   famotidine (PEPCID) 20 MG tablet Take 1 tablet (20 mg total) by mouth daily. 30 tablet 0   liothyronine (CYTOMEL) 5 MCG tablet TAKE ONE TABLET EVERY DAY 90 tablet 1   meclizine (ANTIVERT) 25 MG tablet Take 25 mg by mouth 2 (two) times daily as needed.     mirtazapine (REMERON) 30 MG tablet TAKE ONE TABLET (30 MG) BY MOUTH AT BEDTIME 90 tablet 1   Multiple Vitamins-Minerals (CENTRUM SILVER PO) Take 1  tablet by mouth daily.     polyethylene glycol (MIRALAX / GLYCOLAX) packet Take 17 g by mouth daily as needed for moderate constipation.      rosuvastatin (CRESTOR) 10 MG tablet Take 1 tablet (10 mg total) by mouth daily. 30 tablet 5   vitamin B-12 (CYANOCOBALAMIN) 1000 MCG tablet Take 1,000 mcg by mouth daily.     No facility-administered medications prior to visit.    Review of Systems;  Patient denies headache, fevers, malaise, unintentional weight loss, skin rash, eye pain, sinus congestion and sinus pain, sore throat, dysphagia,  hemoptysis , cough, dyspnea, wheezing, chest pain, palpitations, orthopnea, edema, abdominal pain, nausea, melena, diarrhea, constipation, flank pain, dysuria, hematuria, urinary  Frequency, nocturia, numbness, tingling, seizures,  Focal  weakness, Loss of consciousness,  Tremor, insomnia, depression, anxiety, and suicidal ideation.      Objective:  BP (!) 166/64 (BP Location: Left Arm, Patient Position: Sitting, Cuff Size: Normal)   Pulse 78   Temp (!) 96.1 F (35.6 C) (Temporal)   Ht 5\' 2"  (1.575 m)   Wt 124 lb 3.2 oz (56.3 kg)   SpO2 99%   BMI 22.72 kg/m   BP Readings from Last 3 Encounters:  10/22/21 (!) 166/64  09/11/21 140/62  07/04/21 (!) 136/54    Wt Readings from Last 3 Encounters:  10/22/21 124 lb 3.2 oz (56.3 kg)  09/11/21 125 lb (56.7 kg)  07/04/21 110 lb 12.8 oz (50.3 kg)    General appearance: alert, cooperative and appears stated age Ears: normal TM's and external ear canals both ears Throat: lips, mucosa, and tongue normal; teeth and gums normal Neck: no adenopathy, no carotid bruit, supple, symmetrical, trachea midline and thyroid not enlarged, symmetric, no tenderness/mass/nodules Back: symmetric, no curvature. ROM normal. No CVA tenderness. Lungs: clear to auscultation bilaterally Heart: regular rate and rhythm, S1, S2 normal, no murmur, click, rub or gallop Abdomen: soft, non-tender; bowel sounds normal; no masses,  no organomegaly Pulses: 2+ and symmetric Skin: Skin color, texture, turgor normal. No rashes or lesions Lymph nodes: Cervical, supraclavicular, and axillary nodes normal.  Lab Results  Component Value Date   HGBA1C 5.7 09/11/2021   HGBA1C 5.8 08/18/2019    Lab Results  Component Value Date   CREATININE 1.14 09/11/2021   CREATININE 1.21 (H) 07/04/2021   CREATININE 0.93 07/28/2020    Lab Results  Component Value Date   WBC 6.2 07/04/2021   HGB 13.6 07/04/2021   HCT 40.7 07/04/2021   PLT 267.0 07/04/2021   GLUCOSE 85 09/11/2021   CHOL 161 08/18/2019   TRIG 206.0 (H) 08/18/2019   HDL 41.00 08/18/2019   LDLDIRECT 90.0 08/18/2019   LDLCALC 73 03/19/2018   ALT 16 09/11/2021   AST 18 09/11/2021   NA 139 09/11/2021   K 4.8 09/11/2021   CL 103 09/11/2021    CREATININE 1.14 09/11/2021   BUN 16 09/11/2021   CO2 29 09/11/2021   TSH 3.48 07/04/2021   INR 1.04 12/08/2018   HGBA1C 5.7 09/11/2021    MR Brain Wo Contrast  Result Date: 10/01/2020 CLINICAL DATA:  Mental status change in weight loss after hospitalization for COVID and March 2021. Dementia, vascular cause suspected EXAM: MRI HEAD WITHOUT CONTRAST TECHNIQUE: Multiplanar, multiecho pulse sequences of the brain and surrounding structures were obtained without intravenous contrast. COMPARISON:  03/02/2020 head CT FINDINGS: Brain: Less than typical chronic small vessel ischemic change for age. Normal brain volume for age. No infarct, hemorrhage, hydrocephalus, or collection. No masslike finding. Vascular: Normal  flow voids Skull and upper cervical spine: Normal marrow signal. Degenerative facet spurring asymmetric to the left. Sinuses/Orbits: Negative IMPRESSION: Age normal brain MRI. Electronically Signed   By: Monte Fantasia M.D.   On: 10/01/2020 05:23    Assessment & Plan:   Problem List Items Addressed This Visit     Diarrhea due to malabsorption    Dr Earlean Shawl,  Her GI doctor has recently changed her medication from Creon to Colestipol due to continued nocturnal fecal incontinence due to diarrhea.       Relevant Orders   Magnesium   Comprehensive metabolic panel   Urinalysis, Routine w reflex microscopic   Other fatigue - Primary    She is not orthostatic. Symptoms of profound fatigue occur mid day and resolve after an hour of napping,  suspect fatigue and dehdyration due to   Malabsorption diarrhea under treatment by Dr Earlean Shawl,  Checking lytes.  UA<  Etc,  Encouraged to  increase water intake to 60 ounces daily GRADUALLY       Relevant Orders   TSH   CBC with Differential/Platelet   Pancreatic insufficiency   Other Visit Diagnoses     Urinary frequency       Relevant Orders   Urine Culture       I am having Jerene Pitch. Marchio start on ipratropium. I am also having her  maintain her acetaminophen, vitamin B-12, diclofenac sodium, Multiple Vitamins-Minerals (CENTRUM SILVER PO), polyethylene glycol, famotidine, docusate sodium, benzonatate, donepezil, mirtazapine, liothyronine, meclizine, rosuvastatin, Creon, and colestipol.  Meds ordered this encounter  Medications   ipratropium (ATROVENT) 0.06 % nasal spray    Sig: Place 2 sprays into both nostrils 4 (four) times daily. FOR RUNNY NOSE    Dispense:  15 mL    Refill:  12    There are no discontinued medications.  Follow-up: No follow-ups on file.   Crecencio Mc, MD

## 2021-10-23 LAB — URINE CULTURE
MICRO NUMBER:: 12690028
Result:: NO GROWTH
SPECIMEN QUALITY:: ADEQUATE

## 2021-10-24 ENCOUNTER — Telehealth: Payer: Self-pay | Admitting: Internal Medicine

## 2021-10-24 NOTE — Telephone Encounter (Signed)
Pt called in requesting advice about taking OTC medication she can take for a head cold and cough that started last night. Pt stated she would like for Dr. Derrel Nip to prescribe her something but Pt stated if she has to see the doctor for a prescription then she just rather have something OTC. Pt would like to know what OTC medication can she take for the head cold and cough. Pt requesting callback.

## 2021-10-25 NOTE — Telephone Encounter (Signed)
Spoke with pt and informed her per verbal from Dr. Derrel Nip to take either Mucinex DM or Robitussin DM. Pt gave a verbal understanding.

## 2021-10-31 ENCOUNTER — Ambulatory Visit (INDEPENDENT_AMBULATORY_CARE_PROVIDER_SITE_OTHER): Payer: Medicare Other | Admitting: Nurse Practitioner

## 2021-10-31 ENCOUNTER — Telehealth: Payer: Self-pay | Admitting: Internal Medicine

## 2021-10-31 ENCOUNTER — Encounter: Payer: Self-pay | Admitting: Nurse Practitioner

## 2021-10-31 VITALS — BP 123/73 | Temp 97.5°F

## 2021-10-31 DIAGNOSIS — J40 Bronchitis, not specified as acute or chronic: Secondary | ICD-10-CM | POA: Diagnosis not present

## 2021-10-31 MED ORDER — DOXYCYCLINE HYCLATE 100 MG PO TABS
100.0000 mg | ORAL_TABLET | Freq: Two times a day (BID) | ORAL | 0 refills | Status: AC
Start: 2021-10-31 — End: 2021-11-07

## 2021-10-31 NOTE — Assessment & Plan Note (Signed)
Given patient's presentation with the symptoms we will go ahead and treat for atypical respiratory infection.  Discussed with patient.  We will place her on doxycycline 100 mg twice daily for the next 7 days.  She does not improve she is to follow-up in clinic.  Did discuss signs and symptoms when to be seen urgently or emergently.  Patient acknowledged

## 2021-10-31 NOTE — Progress Notes (Signed)
Patient ID: Emily Wu, female    DOB: [birth year], 87 y.o.   MRN: [id number]  Virtual visit completed through [employer], a video enabled telemedicine application. Due to national recommendations of social distancing due to COVID-19, a virtual visit is felt to be most appropriate for this patient at this time. Reviewed limitations, risks, security and privacy concerns of performing a virtual visit and the availability of in person appointments. I also reviewed that there may be a patient responsible charge related to this service. The patient agreed to proceed.   Phone call lasted 11 minutes and 3 seconds  Patient location: home Provider location: [place] at [facility], office Persons participating in this virtual visit: patient, provider   If any vitals were documented, they were collected by patient at home unless specified below.    BP 123/73   Temp (!) 97.5 F (36.4 C) Comment: per patient   CC: Cough Subjective:   HPI: Emily Wu is a 87 y.o. female presenting on 10/31/2021 for Cough (Sx around 10/29/21-chest congestion, runny nose, some stuffy nose, coughing up yellowish phlegm, post nasal drip, some headache, fatigue. No sore throat or fever. Has been taking Mucinex (made patient disoriented) and cough drops OTC. Covid test negative 10/29/21)   Symptoms started approx  over a week ago Worse 2 days ago Covid test on the 6th that was negative No covid or flu vaccine No sick contacts Tried mucinex and had an adverse reaction and has been using cough drops.  Relevant past medical, surgical, family and social history reviewed and updated as indicated. Interim medical history since our last visit reviewed. Allergies and medications reviewed and updated. Outpatient Medications Prior to Visit  Medication Sig Dispense Refill   acetaminophen (TYLENOL) 650 MG CR tablet Take 650 mg by mouth every 8 (eight) hours as needed for pain.      colestipol (COLESTID) 1 g tablet  Take 1 g by mouth 2 (two) times daily.     donepezil (ARICEPT) 5 MG tablet Take 1 tablet (5 mg total) by mouth at bedtime. 90 tablet 1   famotidine (PEPCID) 20 MG tablet Take 1 tablet (20 mg total) by mouth daily. 30 tablet 0   ipratropium (ATROVENT) 0.06 % nasal spray Place 2 sprays into both nostrils 4 (four) times daily. FOR RUNNY NOSE 15 mL 12   liothyronine (CYTOMEL) 5 MCG tablet TAKE ONE TABLET EVERY DAY 90 tablet 1   mirtazapine (REMERON) 30 MG tablet TAKE ONE TABLET (30 MG) BY MOUTH AT BEDTIME 90 tablet 1   Multiple Vitamins-Minerals (CENTRUM SILVER PO) Take 1 tablet by mouth daily.     polyethylene glycol (MIRALAX / GLYCOLAX) packet Take 17 g by mouth daily as needed for moderate constipation.      rosuvastatin (CRESTOR) 10 MG tablet Take 1 tablet (10 mg total) by mouth daily. 30 tablet 5   vitamin B-12 (CYANOCOBALAMIN) 1000 MCG tablet Take 1,000 mcg by mouth daily.     docusate sodium (COLACE) 100 MG capsule Take 100 mg by mouth 2 (two) times daily. (Patient not taking: Reported on 10/31/2021)     meclizine (ANTIVERT) 25 MG tablet Take 25 mg by mouth 2 (two) times daily as needed. (Patient not taking: Reported on 10/31/2021)     benzonatate (TESSALON) 200 MG capsule Take 1 capsule (200 mg total) by mouth 3 (three) times daily as needed for cough. 60 capsule 1   CREON 36000-114000 units CPEP capsule Take 72,000 Units by mouth 3 (three)  times daily.     diclofenac sodium (VOLTAREN) 1 % GEL Apply 2 g topically 4 (four) times daily as needed (for knee pain). 200 g 11   No facility-administered medications prior to visit.     Per HPI unless specifically indicated in ROS section below Review of Systems  Constitutional:  Positive for appetite change, fatigue and fever (subjective). Negative for chills.  HENT:  Positive for congestion and postnasal drip. Negative for ear discharge, ear pain (pressure), sinus pressure, sinus pain and sore throat.   Respiratory:  Positive for cough (light  yellow). Negative for shortness of breath.   Cardiovascular:  Negative for chest pain.  Gastrointestinal:  Positive for diarrhea (baseline). Negative for abdominal pain, nausea and vomiting.  Musculoskeletal:  Negative for arthralgias and myalgias.  Neurological:  Negative for headaches.  Objective:  BP 123/73   Temp (!) 97.5 F (36.4 C) Comment: per patient  Wt Readings from Last 3 Encounters:  10/22/21 124 lb 3.2 oz (56.3 kg)  09/11/21 125 lb (56.7 kg)  07/04/21 110 lb 12.8 oz (50.3 kg)       Physical exam: Gen: alert, NAD, not ill appearing Pulm: speaks in complete sentences without increased work of breathing Psych: normal mood, normal thought content      Results for orders placed or performed in visit on 10/22/21  Urine Culture   Specimen: Urine  Result Value Ref Range   MICRO NUMBER: [id number]    SPECIMEN QUALITY: Adequate    Sample Source NOT GIVEN    STATUS: FINAL    Result: No Growth   TSH  Result Value Ref Range   TSH 2.64 0.35 - 5.50 uIU/mL  CBC with Differential/Platelet  Result Value Ref Range   WBC 6.1 4.0 - 10.5 K/uL   RBC 4.17 3.87 - 5.11 Mil/uL   Hemoglobin 13.1 12.0 - 15.0 g/dL   HCT 39.8 36.0 - 46.0 %   MCV 95.4 78.0 - 100.0 fl   MCHC 33.0 30.0 - 36.0 g/dL   RDW 13.6 11.5 - 15.5 %   Platelets 255.0 150.0 - 400.0 K/uL   Neutrophils Relative % 42.7 (L) 43.0 - 77.0 %   Lymphocytes Relative 40.7 12.0 - 46.0 %   Monocytes Relative 14.8 (H) 3.0 - 12.0 %   Eosinophils Relative 1.3 0.0 - 5.0 %   Basophils Relative 0.5 0.0 - 3.0 %   Neutro Abs 2.6 1.4 - 7.7 K/uL   Lymphs Abs 2.5 0.7 - 4.0 K/uL   Monocytes Absolute 0.9 0.1 - 1.0 K/uL   Eosinophils Absolute 0.1 0.0 - 0.7 K/uL   Basophils Absolute 0.0 0.0 - 0.1 K/uL  Magnesium  Result Value Ref Range   Magnesium 2.2 1.5 - 2.5 mg/dL  Comprehensive metabolic panel  Result Value Ref Range   Sodium 142 135 - 145 mEq/L   Potassium 3.9 3.5 - 5.1 mEq/L   Chloride 105 96 - 112 mEq/L   CO2 27 19 - 32 mEq/L    Glucose, Bld 73 70 - 99 mg/dL   BUN 15 6 - 23 mg/dL   Creatinine, Ser 1.13 0.40 - 1.20 mg/dL   Total Bilirubin 0.2 0.2 - 1.2 mg/dL   Alkaline Phosphatase 67 39 - 117 U/L   AST 17 0 - 37 U/L   ALT 14 0 - 35 U/L   Total Protein 6.3 6.0 - 8.3 g/dL   Albumin 4.0 3.5 - 5.2 g/dL   GFR 43.46 (L) >60.00 mL/min   Calcium 9.3 8.4 -  10.5 mg/dL  Urinalysis, Routine w reflex microscopic  Result Value Ref Range   Color, Urine YELLOW Yellow;Lt. Yellow;Straw;Dark Yellow;Amber;Green;Red;Brown   APPearance CLEAR Clear;Turbid;Slightly Cloudy;Cloudy   Specific Gravity, Urine 1.020 1.000 - 1.030   pH 5.0 5.0 - 8.0   Total Protein, Urine NEGATIVE Negative   Urine Glucose NEGATIVE Negative   Ketones, ur NEGATIVE Negative   Bilirubin Urine NEGATIVE Negative   Hgb urine dipstick NEGATIVE Negative   Urobilinogen, UA 0.2 0.0 - 1.0   Leukocytes,Ua NEGATIVE Negative   Nitrite NEGATIVE Negative   WBC, UA 0-2/hpf 0-2/hpf   RBC / HPF 0-2/hpf 0-2/hpf   Squamous Epithelial / LPF Rare(0-4/hpf) Rare(0-4/hpf)   Assessment & Plan:   Problem List Items Addressed This Visit       Respiratory   Bronchitis - Primary    Given patient's presentation with the symptoms we will go ahead and treat for atypical respiratory infection.  Discussed with patient.  We will place her on doxycycline 100 mg twice daily for the next 7 days.  She does not improve she is to follow-up in clinic.  Did discuss signs and symptoms when to be seen urgently or emergently.  Patient acknowledged      Relevant Medications   doxycycline (VIBRA-TABS) 100 MG tablet     No orders of the defined types were placed in this encounter.  No orders of the defined types were placed in this encounter.   I discussed the assessment and treatment plan with the patient. The patient was provided an opportunity to ask questions and all were answered. The patient agreed with the plan and demonstrated an understanding of the instructions. The patient was  advised to call back or seek an in-person evaluation if the symptoms worsen or if the condition fails to improve as anticipated.  Follow up plan: No follow-ups on file.  Romilda Garret, NP

## 2021-10-31 NOTE — Telephone Encounter (Signed)
Awaiting access nurse note

## 2021-10-31 NOTE — Telephone Encounter (Signed)
Pt has been scheduled for a virtual visit today.

## 2021-10-31 NOTE — Telephone Encounter (Signed)
Patient  daughter called in and stated has been ill for 9 days , Patient have cough,chill,  headache eaten habit has change, Has took two covid test but are  negative  xfer to Access Nurse

## 2021-11-05 DIAGNOSIS — Z20822 Contact with and (suspected) exposure to covid-19: Secondary | ICD-10-CM | POA: Diagnosis not present

## 2021-11-07 DIAGNOSIS — M5416 Radiculopathy, lumbar region: Secondary | ICD-10-CM | POA: Diagnosis not present

## 2021-11-08 ENCOUNTER — Other Ambulatory Visit: Payer: Medicare Other

## 2021-11-08 ENCOUNTER — Other Ambulatory Visit: Payer: Self-pay

## 2021-11-08 DIAGNOSIS — Z20822 Contact with and (suspected) exposure to covid-19: Secondary | ICD-10-CM | POA: Diagnosis not present

## 2021-11-08 DIAGNOSIS — Z515 Encounter for palliative care: Secondary | ICD-10-CM

## 2021-11-08 NOTE — Progress Notes (Signed)
PATIENT NAME: Emily Wu DOB: 1931-02-25 MRN: [id number]  PRIMARY CARE PROVIDER: Crecencio Mc, MD  RESPONSIBLE PARTY:  Acct ID - Guarantor Home Phone Work Phone Relationship Acct Type  [id number] Emily [phone]  Self P/F     [address], [place], [place] [address]    Due to the COVID-19 crisis, this visit was done via telemedicine from my office and it was initiated and consent by this patient and or family.  I connected with  [place] PROXY on 11/08/21 telephonically and verified that I am speaking with the correct person using two identifiers.   I discussed the limitations of evaluation and management by telemedicine. The patient expressed understanding and agreed to proceed.   PLAN OF CARE and INTERVENTIONS:               1.  GOALS OF CARE/ ADVANCE CARE PLANNING:  Remain home and independent with hired caregivers.               2.  PATIENT/CAREGIVER EDUCATION:  Follow up for respiratory issues.               4. PERSONAL EMERGENCY PLAN:  Activate 911 for emergencies.                5.  DISEASE STATUS:  Connected with patient telephonically.   Respiratory:  Patient endorses dealing with bronchitis for almost 3 weeks.  She was recently treated with antibiotics and reports she is improving but it has been slow.  No coughing reported but patient endorses her nose has been congested at runny.  Encouraged her to follow up with PCP if symptoms worsen or do not improve.  Mobility:  Patient remains ambulatory with a rolling walker.  No falls are reported.  Patient states she keeps her walker with her at all times.    Safety:  Patient continues to have private caregivers in the home to assist her.  She has a life alert in place when she is home alone.   Daughters continue to support patient and visit patient routinely.  They are assisting with medical issues and ordering food online for patient.  Diarrhea:  Patient denies any further issues.  She is being followed  by GI and reports medications have been helping.  Anorexia:  Patient endorses a good appetite.  Her caregiver is taking her out for meals or picking meals up.  Patient reports weight has been maintaining at 120-121 lbs until recently.  She is now 116 lbs and believes this is contributed to her respiratory condition.  She notes not being hungry at times but is ensuring she is eating meals routinely.   HISTORY OF PRESENT ILLNESS:  87 year old female with mixed dementia.  Patient is being followed by Palliative Care monthly and PRN.   CODE STATUS: Full ADVANCED DIRECTIVES: Yes MOST FORM: No PPS: 50%         Lorenza Burton, RN

## 2021-11-21 ENCOUNTER — Other Ambulatory Visit: Payer: Self-pay | Admitting: Internal Medicine

## 2021-11-27 DIAGNOSIS — M1711 Unilateral primary osteoarthritis, right knee: Secondary | ICD-10-CM | POA: Diagnosis not present

## 2021-11-28 DIAGNOSIS — K8681 Exocrine pancreatic insufficiency: Secondary | ICD-10-CM | POA: Diagnosis not present

## 2021-11-28 NOTE — Patient Instructions (Addendum)
DUE TO COVID-19 ONLY ONE VISITOR IS ALLOWED TO COME WITH YOU AND STAY IN THE WAITING ROOM ONLY DURING PRE OP AND PROCEDURE DAY OF SURGERY IF YOU ARE GOING HOME AFTER SURGERY. IF YOU ARE SPENDING THE NIGHT 2 PEOPLE MAY VISIT WITH YOU IN YOUR PRIVATE ROOM AFTER SURGERY UNTIL VISITING  HOURS ARE OVER AT 800 PM AND 1  VISITOR  MAY  SPEND THE NIGHT.   YOU NEED TO HAVE A COVID 19 TEST ON_1/13______ @__9 :45_____, THIS TEST MUST BE DONE BEFORE SURGERY, Come in through [place] main entrance and sit in the chairs to the right. Call [phone] and the nurse will let you in. You do not need to stop at admitting.               Emily Wu     Your procedure is scheduled on: 12/10/21   Report to [facility] Main  Entrance   Report to short stay at 5:15 AM     Call this number if you have problems the morning of surgery [phone]   No food after midnight.    You may have clear liquid until 4:30 AM.    At 4:00 AM drink pre surgery drink.   Nothing by mouth after 4:30 AM.    CLEAR LIQUID DIET   Foods Allowed                                                                     Foods Excluded  Coffee and tea, regular and decaf                             liquids that you cannot  Plain Jell-O any favor except red or purple                                           see through such as: Fruit ices (not with fruit pulp)                                     milk, soups, orange juice  Iced Popsicles                                    All solid food Carbonated beverages, regular and diet                                    Cranberry, grape and apple juices Sports drinks like Gatorade Lightly seasoned clear broth or consume(fat free) Sugar    BRUSH YOUR TEETH MORNING OF SURGERY AND RINSE YOUR MOUTH OUT, NO CHEWING GUM CANDY OR MINTS.     Take these medicines the morning of surgery with A SIP OF WATER: Donepezil,Liothyronine  You may not have any  metal on your body including hair pins and              piercings  Do not wear jewelry, make-up, lotions, powders or perfumes, deodorant             Do not wear nail polish on your fingernails.  Do not shave  48 hours prior to surgery.              Do not bring valuables to the hospital. [place].  Contacts, dentures or bridgework may not be worn into surgery.  Leave suitcase in the car. After surgery it may be brought to your room.                 Please read over the following fact sheets you were given: _____________________________________________________________________             [facility] - Preparing for Surgery Before surgery, you can play an important role.  Because skin is not sterile, your skin needs to be as free of germs as possible.  You can reduce the number of germs on your skin by washing with CHG (chlorahexidine gluconate) soap before surgery.  CHG is an antiseptic cleaner which kills germs and bonds with the skin to continue killing germs even after washing. Please DO NOT use if you have an allergy to CHG or antibacterial soaps.  If your skin becomes reddened/irritated stop using the CHG and inform your nurse when you arrive at Short Stay. Do not shave (including legs and underarms) for at least 48 hours prior to the first CHG shower.   Please follow these instructions carefully:  1.  Shower with CHG Soap the night before surgery and the  morning of Surgery.  2.  If you choose to wash your hair, wash your hair first as usual with your  normal  shampoo.  3.  After you shampoo, rinse your hair and body thoroughly to remove the  shampoo.                            4.  Use CHG as you would any other liquid soap.  You can apply chg directly  to the skin and wash                       Gently with a scrungie or clean washcloth.  5.  Apply the CHG Soap to your body ONLY FROM THE NECK DOWN.   Do not use on face/ open                            Wound or open sores. Avoid contact with eyes, ears mouth and genitals (private parts).                       Wash face,  Genitals (private parts) with your normal soap.             6.  Wash thoroughly, paying special attention to the area where your surgery  will be performed.  7.  Thoroughly rinse your body with warm water from the neck down.  8.  DO NOT shower/wash with your normal soap after using and rinsing off  the CHG Soap.  9.  Pat yourself dry with a clean towel.            10.  Wear clean pajamas.            11.  Place clean sheets on your bed the night of your first shower and do not  sleep with pets. Day of Surgery : Do not apply any lotions/deodorants the morning of surgery.  Please wear clean clothes to the hospital/surgery center.  FAILURE TO FOLLOW THESE INSTRUCTIONS MAY RESULT IN THE CANCELLATION OF YOUR SURGERY PATIENT SIGNATURE_________________________________  NURSE SIGNATURE__________________________________  ________________________________________________________________________   [name]  An incentive spirometer is a tool that can help keep your lungs clear and active. This tool measures how well you are filling your lungs with each breath. Taking long deep breaths may help reverse or decrease the chance of developing breathing (pulmonary) problems (especially infection) following: A long period of time when you are unable to move or be active. BEFORE THE PROCEDURE  If the spirometer includes an indicator to show your best effort, your nurse or respiratory therapist will set it to a desired goal. If possible, sit up straight or lean slightly forward. Try not to slouch. Hold the incentive spirometer in an upright position. INSTRUCTIONS FOR USE  Sit on the edge of your bed if possible, or sit up as far as you can in bed or on a chair. Hold the incentive spirometer in an upright position. Breathe out normally. Place the  mouthpiece in your mouth and seal your lips tightly around it. Breathe in slowly and as deeply as possible, raising the piston or the ball toward the top of the column. Hold your breath for 3-5 seconds or for as long as possible. Allow the piston or ball to fall to the bottom of the column. Remove the mouthpiece from your mouth and breathe out normally. Rest for a few seconds and repeat Steps 1 through 7 at least 10 times every 1-2 hours when you are awake. Take your time and take a few normal breaths between deep breaths. The spirometer may include an indicator to show your best effort. Use the indicator as a goal to work toward during each repetition. After each set of 10 deep breaths, practice coughing to be sure your lungs are clear. If you have an incision (the cut made at the time of surgery), support your incision when coughing by placing a pillow or rolled up towels firmly against it. Once you are able to get out of bed, walk around indoors and cough well. You may stop using the incentive spirometer when instructed by your caregiver.  RISKS AND COMPLICATIONS Take your time so you do not get dizzy or light-headed. If you are in pain, you may need to take or ask for pain medication before doing incentive spirometry. It is harder to take a deep breath if you are having pain. AFTER USE Rest and breathe slowly and easily. It can be helpful to keep track of a log of your progress. Your caregiver can provide you with a simple table to help with this. If you are using the spirometer at home, follow these instructions: [place] IF:  You are having difficultly using the spirometer. You have trouble using the spirometer as often as instructed. Your pain medication is not giving enough relief while using the spirometer. You develop fever of 100.5 F (38.1 C) or higher. SEEK IMMEDIATE MEDICAL CARE IF:  You cough up bloody sputum that had not  been present before. You develop fever of 102 F  (38.9 C) or greater. You develop worsening pain at or near the incision site. MAKE SURE YOU:  Understand these instructions. Will watch your condition. Will get help right away if you are not doing well or get worse. Document Released: 03/23/2007 Document Revised: 02/02/2012 Document Reviewed: 05/24/2007 [facility] Patient Information 2014 [place], [place].   ________________________________________________________________________

## 2021-11-29 ENCOUNTER — Other Ambulatory Visit: Payer: Self-pay

## 2021-11-29 ENCOUNTER — Encounter (HOSPITAL_COMMUNITY): Payer: Self-pay

## 2021-11-29 ENCOUNTER — Encounter (HOSPITAL_COMMUNITY)
Admission: RE | Admit: 2021-11-29 | Discharge: 2021-11-29 | Disposition: A | Discharge status: Home / Self Care | Payer: Medicare HMO | Source: Ambulatory Visit | Attending: Orthopedic Surgery | Admitting: Orthopedic Surgery

## 2021-11-29 VITALS — BP 132/48 | HR 68 | Temp 97.8°F | Resp 18 | Ht 62.0 in | Wt 120.0 lb

## 2021-11-29 DIAGNOSIS — R7303 Prediabetes: Secondary | ICD-10-CM | POA: Diagnosis not present

## 2021-11-29 DIAGNOSIS — Z01812 Encounter for preprocedural laboratory examination: Secondary | ICD-10-CM | POA: Insufficient documentation

## 2021-11-29 DIAGNOSIS — Z01818 Encounter for other preprocedural examination: Secondary | ICD-10-CM

## 2021-11-29 LAB — CBC
HCT: 40.6 % (ref 36.0–46.0)
Hemoglobin: 13.3 g/dL (ref 12.0–15.0)
MCH: 31.9 pg (ref 26.0–34.0)
MCHC: 32.8 g/dL (ref 30.0–36.0)
MCV: 97.4 fL (ref 80.0–100.0)
Platelets: 255 10*3/uL (ref 150–400)
RBC: 4.17 MIL/uL (ref 3.87–5.11)
RDW: 14 % (ref 11.5–15.5)
WBC: 8.4 10*3/uL (ref 4.0–10.5)
nRBC: 0 % (ref 0.0–0.2)

## 2021-11-29 LAB — BASIC METABOLIC PANEL
Anion gap: 6 (ref 5–15)
BUN: 15 mg/dL (ref 8–23)
CO2: 25 mmol/L (ref 22–32)
Calcium: 9.3 mg/dL (ref 8.9–10.3)
Chloride: 109 mmol/L (ref 98–111)
Creatinine, Ser: 1.07 mg/dL — ABNORMAL HIGH (ref 0.44–1.00)
GFR, Estimated: 50 mL/min — ABNORMAL LOW (ref >60)
Glucose, Bld: 89 mg/dL (ref 70–99)
Potassium: 4 mmol/L (ref 3.5–5.1)
Sodium: 140 mmol/L (ref 135–145)

## 2021-11-29 LAB — SURGICAL PCR SCREEN
MRSA, PCR: NEGATIVE
Staphylococcus aureus: NEGATIVE

## 2021-11-29 LAB — GLUCOSE, CAPILLARY: Glucose-Capillary: 80 mg/dL (ref 70–99)

## 2021-11-29 NOTE — Progress Notes (Addendum)
COVID test- 12/06/21 at 9:45  PCP - Dr. Judieth Keens Cardiologist - [facility]  Chest x-ray - 09/12/21-epic EKG - 09/11/21-epic Stress Test - 2020 ECHO - 2020 Cardiac Cath - no Pacemaker/ICD device last checked:NA  Sleep Study - no CPAP -   Fasting Blood Sugar - pt denies pre-diabetes Checks Blood Sugar _____ times a day  Blood Thinner Instructions:NA Aspirin Instructions: Last Dose:  Anesthesia review: yes  Patient denies shortness of breath, fever, cough and chest pain at PAT appointment Pt walk with a cane she has home health aids and reports no SOB with ADLs.   Patient verbalized understanding of instructions that were given to them at the PAT appointment. Patient was also instructed that they will need to review over the PAT instructions again at home before surgery. Yes   A1c was reported "lost in transit" her CBG at PAT was 80, glucose was 89 and she was listed as pre diabetic which the Pt denies.

## 2021-12-03 NOTE — Care Plan (Signed)
Ortho Bundle Case Management Note  Patient Details  Name: Emily Wu MRN: [id number] Date of Birth: [birth year]    Patient was seen in the office prior to surgery and multiple phone conversations. She will discharge to home with hired assistance. She receives PCS care from a LTC policy. She is hiring someone to assist with more hours at discharge. We have discussed multiple times that this is not covered by [facility]. She has all needed equipment at home. HHPT referral to [place] and OPPT set up with [facility]. Paitent and MD in agreement with plan. CHoice offered                  DME Arranged:    DME Agency:     HH Arranged:  PT HH Agency:  [facility]  Additional Comments: Please contact me with any questions of if this plan should need to change.  Ladell Heads,  [facility]  [phone] 12/03/2021, 9:32 AM

## 2021-12-06 ENCOUNTER — Encounter (INDEPENDENT_AMBULATORY_CARE_PROVIDER_SITE_OTHER): Payer: Self-pay

## 2021-12-06 ENCOUNTER — Encounter (HOSPITAL_COMMUNITY)
Admission: RE | Admit: 2021-12-06 | Discharge: 2021-12-06 | Disposition: A | Discharge status: Home / Self Care | Payer: Medicare HMO | Source: Ambulatory Visit | Attending: Orthopedic Surgery | Admitting: Orthopedic Surgery

## 2021-12-06 ENCOUNTER — Other Ambulatory Visit: Payer: Self-pay

## 2021-12-06 DIAGNOSIS — Z01812 Encounter for preprocedural laboratory examination: Secondary | ICD-10-CM | POA: Diagnosis not present

## 2021-12-06 DIAGNOSIS — Z20822 Contact with and (suspected) exposure to covid-19: Secondary | ICD-10-CM | POA: Insufficient documentation

## 2021-12-06 DIAGNOSIS — Z01818 Encounter for other preprocedural examination: Secondary | ICD-10-CM

## 2021-12-06 LAB — SARS CORONAVIRUS 2 (TAT 6-24 HRS): SARS Coronavirus 2: NEGATIVE

## 2021-12-06 NOTE — H&P (Signed)
KNEE ARTHROPLASTY ADMISSION H&P  Patient ID: Emily Wu MRN: [id number] DOB/AGE: October 28, 1931 88 y.o.  Chief Complaint: right knee pain.  Planned Procedure Date: 12/10/21 Medical Clearance by Dr. Derrel Nip    HPI: Emily Wu is a 88 y.o. female who presents for evaluation of djd right knee. The patient has a history of pain and functional disability in the right knee due to arthritis and has failed non-surgical conservative treatments for greater than 12 weeks to include NSAID's and/or analgesics and activity modification.  Onset of symptoms was gradual, starting 7 years ago with gradually worsening course since that time. The patient noted no past surgery on the right knee.  Patient currently rates pain at 3 out of 10 with activity. Patient has worsening of pain with activity and weight bearing and pain that interferes with activities of daily living.  Patient has evidence of joint space narrowing by imaging studies.  There is no active infection.  Past Medical History:  Diagnosis Date   Arthritis    "knees, shoulders, chest" (02/03/2014)   Bruises easily    Cervical cancer [facility]) 1965   Concussion 03/11/2020   High cholesterol    History of kidney stones    Hypertension    DENIES    Tendency toward bleeding easily [facility])    Past Surgical History:  Procedure Laterality Date   ABDOMINAL HYSTERECTOMY  1965   APPENDECTOMY     CHOLECYSTECTOMY     KNEE ARTHROSCOPY Left    [place]   PARTIAL KNEE ARTHROPLASTY Left 01/26/2019   Procedure: UNICOMPARTMENTAL KNEE;  Surgeon: Marchia Bond, MD;  Location: [place];  Service: Orthopedics;  Laterality: Left;   SHOULDER OPEN ROTATOR CUFF REPAIR Bilateral 2007,  2010   [place]   THROAT SURGERY  2014   "arthritis hugh knot" (02/03/2014)   Allergies  Allergen Reactions   Tramadol Anaphylaxis and Other (See Comments)    Lowered Blood Pressure   Amoxicillin Rash   Promethazine Hcl Rash   Sulfa Drugs Cross Reactors Rash   Prior to Admission  medications   Medication Sig Start Date End Date Taking? Authorizing Provider  ACETAMINOPHEN ER PO Take 500 mg by mouth daily.   Yes [provider]  colestipol (COLESTID) 1 g tablet Take 1 g by mouth 2 (two) times daily. 10/21/21  Yes [provider]  donepezil (ARICEPT) 5 MG tablet Take 1 tablet (5 mg total) by mouth at bedtime. Patient taking differently: Take 10 mg by mouth at bedtime. 03/28/21  Yes Crecencio Mc, MD  ipratropium (ATROVENT) 0.06 % nasal spray Place 2 sprays into both nostrils 4 (four) times daily. FOR RUNNY NOSE 10/22/21  Yes Crecencio Mc, MD  liothyronine (CYTOMEL) 5 MCG tablet TAKE ONE TABLET EVERY DAY 06/13/21  Yes Crecencio Mc, MD  meclizine (ANTIVERT) 25 MG tablet Take 25 mg by mouth 2 (two) times daily as needed for dizziness. 07/02/21  Yes [provider]  rosuvastatin (CRESTOR) 10 MG tablet Take 1 tablet (10 mg total) by mouth daily. 09/11/21  Yes Crecencio Mc, MD  vitamin B-12 (CYANOCOBALAMIN) 1000 MCG tablet Take 1,000 mcg by mouth daily.   Yes [provider]  mirtazapine (REMERON) 30 MG tablet TAKE ONE TABLET (30 MG) BY MOUTH AT BEDTIME 11/21/21   Crecencio Mc, MD   Social History   Socioeconomic History   Marital status: Married    Spouse name: Not on file   Number of children: Not on file   Years of  education: Not on file   Highest education level: Not on file  Occupational History   Not on file  Tobacco Use   Smoking status: Former    Packs/day: 2.00    Years: 7.00    Pack years: 14.00    Types: Cigarettes    Quit date: 60    Years since quitting: 63.0   Smokeless tobacco: Never   Tobacco comments:    02/03/2014 "quit smoking in the 1960's"  Vaping Use   Vaping Use: Never used  Substance and Sexual Activity   Alcohol use: No   Drug use: No   Sexual activity: Never  Other Topics Concern   Not on file  Social History Narrative   Not on file   Social Determinants of Health   Financial  Resource Strain: Low Risk    Difficulty of Paying Living Expenses: Not hard at all  Food Insecurity: No Food Insecurity   Worried About Charity fundraiser in the Last Year: Never true   [place] in the Last Year: Never true  Transportation Needs: No Transportation Needs   Lack of Transportation (Medical): No   Lack of Transportation (Non-Medical): No  Physical Activity: Not on file  Stress: No Stress Concern Present   Feeling of Stress : Not at all  Social Connections: Unknown   Frequency of Communication with Friends and Family: More than three times a week   Frequency of Social Gatherings with Friends and Family: More than three times a week   Attends Religious Services: Not on file   Active Member of Clubs or Organizations: Not on file   Attends Archivist Meetings: Not on file   Marital Status: Widowed   Family History  Problem Relation Age of Onset   Peripheral vascular disease Mother    Stroke Father    Hyperlipidemia Father    Heart disease Father 37   Cancer Neg Hx     ROS: Currently denies lightheadedness, dizziness, Fever, chills, CP, SOB. No personal history of DVT, PE, MI, or CVA. No loose teeth or dentures All other systems have been reviewed and were otherwise currently negative with the exception of those mentioned in the HPI and as above.  Objective: Vitals: Ht: 5\' 2"  Wt: 121.6 lbs Temp: 97.9 BP: 133/70 Pulse: 57 O2 99% on room air.   Physical Exam: General: Alert, NAD.  Antalgic Gait  HEENT: EOMI, Good Neck Extension  Pulm: No increased work of breathing.  Clear B/L A/P w/o crackle or wheeze.  CV: RRR, No m/g/r appreciated  GI: soft, NT, ND Neuro: Neuro without gross focal deficit.  Sensation intact distally Skin: No lesions in the area of chief complaint MSK/Surgical Site: right knee w/o redness or effusion.  No medial or lateral JLT. ROM 10-100.  5/5 strength in extension and flexion.  +EHL/FHL.  NVI.  Stable varus and valgus stress.     Imaging Review Plain radiographs demonstrate severe degenerative joint disease of the right knee.   The overall alignment ismild valgus. The bone quality appears to be adequate for age and reported activity level.  Preoperative templating of the joint replacement has been completed, documented, and submitted to the Operating Room personnel in order to optimize intra-operative equipment management.  Assessment: djd right knee Principal Problem:   Osteoarthritis of right knee   Plan: Plan for Procedure(s): UNICOMPARTMENTAL KNEE  The patient history, physical exam, clinical judgement of the provider and imaging are consistent with end stage degenerative joint  disease and unicompartmental joint arthroplasty is deemed medically necessary. The treatment options including medical management, injection therapy, and arthroplasty were discussed at length. The risks and benefits of Procedure(s): UNICOMPARTMENTAL KNEE were presented and reviewed.  The risks of nonoperative treatment, versus surgical intervention including but not limited to continued pain, aseptic loosening, stiffness, dislocation/subluxation, infection, bleeding, nerve injury, blood clots, cardiopulmonary complications, morbidity, mortality, among others were discussed. The patient verbalizes understanding and wishes to proceed with the plan.  Patient is being admitted for inpatient treatment for surgery, pain control, PT, prophylactic antibiotics, VTE prophylaxis, progressive ambulation, ADL's and discharge planning.   Dental prophylaxis discussed and recommended for 2 years postoperatively.  The patient does  meet the criteria for TXA which will be used perioperatively.   ASA 325 mg  will be used postoperatively for DVT prophylaxis in addition to SCDs, and early ambulation. The patient is planning to be discharged home with HHPT in care of daytime aid and daughters   Jola Baptist 12/06/2021 3:47 PM

## 2021-12-09 NOTE — Progress Notes (Signed)
Spoke with pts daughter in regards to pt arriving at [facility] admitting at 1110 for scheduled surgical procedure on 12/10/2021. Aware no food after midnight; clear liquids for midnight till 1015 am consuming entire presurgery drink by 1015 then nothing by mouth.

## 2021-12-10 ENCOUNTER — Ambulatory Visit (HOSPITAL_COMMUNITY): Payer: Medicare HMO | Admitting: Certified Registered Nurse Anesthetist

## 2021-12-10 ENCOUNTER — Encounter (HOSPITAL_COMMUNITY): Payer: Self-pay | Admitting: Orthopedic Surgery

## 2021-12-10 ENCOUNTER — Other Ambulatory Visit: Payer: Self-pay

## 2021-12-10 ENCOUNTER — Observation Stay (HOSPITAL_COMMUNITY)
Admission: RE | Admit: 2021-12-10 | Discharge: 2021-12-11 | Disposition: A | Discharge status: Home / Self Care | Payer: Medicare HMO | Attending: Orthopedic Surgery | Admitting: Orthopedic Surgery

## 2021-12-10 ENCOUNTER — Observation Stay (HOSPITAL_COMMUNITY): Payer: Medicare HMO

## 2021-12-10 ENCOUNTER — Encounter (HOSPITAL_COMMUNITY)
Admission: RE | Disposition: A | Discharge status: Home / Self Care | Payer: Self-pay | Source: Home / Self Care | Attending: Orthopedic Surgery

## 2021-12-10 DIAGNOSIS — Z87891 Personal history of nicotine dependence: Secondary | ICD-10-CM | POA: Insufficient documentation

## 2021-12-10 DIAGNOSIS — Z8541 Personal history of malignant neoplasm of cervix uteri: Secondary | ICD-10-CM | POA: Diagnosis not present

## 2021-12-10 DIAGNOSIS — Z79899 Other long term (current) drug therapy: Secondary | ICD-10-CM | POA: Diagnosis not present

## 2021-12-10 DIAGNOSIS — I10 Essential (primary) hypertension: Secondary | ICD-10-CM | POA: Insufficient documentation

## 2021-12-10 DIAGNOSIS — Z96652 Presence of left artificial knee joint: Secondary | ICD-10-CM | POA: Diagnosis not present

## 2021-12-10 DIAGNOSIS — Z96651 Presence of right artificial knee joint: Secondary | ICD-10-CM | POA: Diagnosis not present

## 2021-12-10 DIAGNOSIS — Z471 Aftercare following joint replacement surgery: Secondary | ICD-10-CM | POA: Diagnosis not present

## 2021-12-10 DIAGNOSIS — M1711 Unilateral primary osteoarthritis, right knee: Secondary | ICD-10-CM | POA: Diagnosis not present

## 2021-12-10 DIAGNOSIS — G8918 Other acute postprocedural pain: Secondary | ICD-10-CM | POA: Diagnosis not present

## 2021-12-10 HISTORY — PX: PARTIAL KNEE ARTHROPLASTY: SHX2174

## 2021-12-10 SURGERY — ARTHROPLASTY, KNEE, UNICOMPARTMENTAL
Anesthesia: Spinal | Site: Knee | Laterality: Right

## 2021-12-10 MED ORDER — ACETAMINOPHEN 500 MG PO TABS: 500.0000 mg | ORAL_TABLET | Freq: Four times a day (QID) | ORAL | Status: DC

## 2021-12-10 MED ORDER — CHLORHEXIDINE GLUCONATE 0.12 % MT SOLN
15.0000 mL | Freq: Once | OROMUCOSAL | Status: AC
  Administered 2021-12-10: 15 mL via OROMUCOSAL

## 2021-12-10 MED ORDER — KETOROLAC TROMETHAMINE 30 MG/ML IJ SOLN
INTRAMUSCULAR | Status: DC | PRN
  Administered 2021-12-10: 30 mg

## 2021-12-10 MED ORDER — POLYETHYLENE GLYCOL 3350 17 G PO PACK: 17.0000 g | PACK | Freq: Every day | ORAL | Status: DC | PRN

## 2021-12-10 MED ORDER — OXYCODONE HCL 5 MG/5ML PO SOLN: 5.0000 mg | Freq: Once | ORAL | Status: DC | PRN

## 2021-12-10 MED ORDER — BUPIVACAINE HCL (PF) 0.25 % IJ SOLN
INTRAMUSCULAR | Status: AC
  Filled 2021-12-10: qty 30

## 2021-12-10 MED ORDER — DEXAMETHASONE SODIUM PHOSPHATE 10 MG/ML IJ SOLN
10.0000 mg | Freq: Once | INTRAMUSCULAR | Status: AC
  Administered 2021-12-11: 10 mg via INTRAVENOUS
  Filled 2021-12-10: qty 1

## 2021-12-10 MED ORDER — DOCUSATE SODIUM 100 MG PO CAPS
100.0000 mg | ORAL_CAPSULE | Freq: Two times a day (BID) | ORAL | Status: DC
Start: 2021-12-10 — End: 2021-12-11
  Administered 2021-12-10 – 2021-12-11 (×2): 100 mg via ORAL
  Filled 2021-12-10 (×2): qty 1

## 2021-12-10 MED ORDER — ASPIRIN EC 325 MG PO TBEC
325.0000 mg | DELAYED_RELEASE_TABLET | Freq: Every day | ORAL | Status: DC
  Administered 2021-12-11: 325 mg via ORAL
  Filled 2021-12-10: qty 1

## 2021-12-10 MED ORDER — DONEPEZIL HCL 10 MG PO TABS
10.0000 mg | ORAL_TABLET | Freq: Every day | ORAL | Status: DC
  Administered 2021-12-10: 10 mg via ORAL
  Filled 2021-12-10: qty 1

## 2021-12-10 MED ORDER — HYDROCODONE-ACETAMINOPHEN 5-325 MG PO TABS
1.0000 | ORAL_TABLET | ORAL | Status: DC | PRN
  Administered 2021-12-10 – 2021-12-11 (×4): 1 via ORAL
  Filled 2021-12-10 (×4): qty 1

## 2021-12-10 MED ORDER — MORPHINE SULFATE (PF) 2 MG/ML IV SOLN: 0.5000 mg | INTRAVENOUS | Status: DC | PRN

## 2021-12-10 MED ORDER — LACTATED RINGERS IV SOLN: INTRAVENOUS | Status: DC

## 2021-12-10 MED ORDER — METOCLOPRAMIDE HCL 5 MG PO TABS: 5.0000 mg | ORAL_TABLET | Freq: Three times a day (TID) | ORAL | Status: DC | PRN

## 2021-12-10 MED ORDER — LIOTHYRONINE SODIUM 5 MCG PO TABS
5.0000 ug | ORAL_TABLET | Freq: Every day | ORAL | Status: DC
  Administered 2021-12-11: 5 ug via ORAL
  Filled 2021-12-10: qty 1

## 2021-12-10 MED ORDER — POVIDONE-IODINE 10 % EX SWAB: 2.0000 "application " | Freq: Once | CUTANEOUS | Status: DC

## 2021-12-10 MED ORDER — SODIUM CHLORIDE 0.9 % IR SOLN
Status: DC | PRN
  Administered 2021-12-10: 3000 mL

## 2021-12-10 MED ORDER — COLESTIPOL HCL 1 G PO TABS
1.0000 g | ORAL_TABLET | Freq: Two times a day (BID) | ORAL | Status: DC
  Administered 2021-12-10 – 2021-12-11 (×2): 1 g via ORAL
  Filled 2021-12-10 (×2): qty 1

## 2021-12-10 MED ORDER — CEFAZOLIN SODIUM-DEXTROSE 1-4 GM/50ML-% IV SOLN
1.0000 g | Freq: Four times a day (QID) | INTRAVENOUS | Status: AC
  Administered 2021-12-10 – 2021-12-11 (×2): 1 g via INTRAVENOUS
  Filled 2021-12-10 (×2): qty 50

## 2021-12-10 MED ORDER — FENTANYL CITRATE PF 50 MCG/ML IJ SOSY
50.0000 ug | PREFILLED_SYRINGE | INTRAMUSCULAR | Status: DC
  Administered 2021-12-10: 50 ug via INTRAVENOUS
  Filled 2021-12-10: qty 2

## 2021-12-10 MED ORDER — ONDANSETRON HCL 4 MG/2ML IJ SOLN: 4.0000 mg | Freq: Once | INTRAMUSCULAR | Status: DC | PRN

## 2021-12-10 MED ORDER — METOCLOPRAMIDE HCL 5 MG/ML IJ SOLN: 5.0000 mg | Freq: Three times a day (TID) | INTRAMUSCULAR | Status: DC | PRN

## 2021-12-10 MED ORDER — OXYCODONE HCL 5 MG PO TABS: 5.0000 mg | ORAL_TABLET | Freq: Once | ORAL | Status: DC | PRN

## 2021-12-10 MED ORDER — BUPIVACAINE IN DEXTROSE 0.75-8.25 % IT SOLN
INTRATHECAL | Status: DC | PRN
  Administered 2021-12-10: 1.4 mL via INTRATHECAL

## 2021-12-10 MED ORDER — ALUM & MAG HYDROXIDE-SIMETH 200-200-20 MG/5ML PO SUSP: 30.0000 mL | ORAL | Status: DC | PRN

## 2021-12-10 MED ORDER — EPHEDRINE SULFATE-NACL 50-0.9 MG/10ML-% IV SOSY
PREFILLED_SYRINGE | INTRAVENOUS | Status: DC | PRN
  Administered 2021-12-10 (×2): 10 mg via INTRAVENOUS

## 2021-12-10 MED ORDER — ACETAMINOPHEN 500 MG PO TABS
1000.0000 mg | ORAL_TABLET | Freq: Once | ORAL | Status: AC
  Administered 2021-12-10: 1000 mg via ORAL
  Filled 2021-12-10: qty 2

## 2021-12-10 MED ORDER — ORAL CARE MOUTH RINSE: 15.0000 mL | Freq: Once | OROMUCOSAL | Status: AC

## 2021-12-10 MED ORDER — ROSUVASTATIN CALCIUM 10 MG PO TABS
10.0000 mg | ORAL_TABLET | Freq: Every day | ORAL | Status: DC
  Administered 2021-12-10 – 2021-12-11 (×2): 10 mg via ORAL
  Filled 2021-12-10 (×2): qty 1

## 2021-12-10 MED ORDER — TRANEXAMIC ACID-NACL 1000-0.7 MG/100ML-% IV SOLN
1000.0000 mg | INTRAVENOUS | Status: AC
  Administered 2021-12-10: 1000 mg via INTRAVENOUS
  Filled 2021-12-10: qty 100

## 2021-12-10 MED ORDER — ACETAMINOPHEN 325 MG PO TABS: 325.0000 mg | ORAL_TABLET | Freq: Four times a day (QID) | ORAL | Status: DC | PRN

## 2021-12-10 MED ORDER — BISACODYL 10 MG RE SUPP: 10.0000 mg | Freq: Every day | RECTAL | Status: DC | PRN

## 2021-12-10 MED ORDER — DEXAMETHASONE SODIUM PHOSPHATE 10 MG/ML IJ SOLN
INTRAMUSCULAR | Status: DC | PRN
  Administered 2021-12-10: 10 mg via INTRAVENOUS

## 2021-12-10 MED ORDER — CEFAZOLIN SODIUM-DEXTROSE 2-4 GM/100ML-% IV SOLN
2.0000 g | INTRAVENOUS | Status: AC
  Administered 2021-12-10: 2 g via INTRAVENOUS
  Filled 2021-12-10: qty 100

## 2021-12-10 MED ORDER — TRANEXAMIC ACID-NACL 1000-0.7 MG/100ML-% IV SOLN
1000.0000 mg | Freq: Once | INTRAVENOUS | Status: AC
  Administered 2021-12-10: 1000 mg via INTRAVENOUS
  Filled 2021-12-10: qty 100

## 2021-12-10 MED ORDER — DIPHENHYDRAMINE HCL 12.5 MG/5ML PO ELIX
12.5000 mg | ORAL_SOLUTION | ORAL | Status: DC | PRN
  Administered 2021-12-10 – 2021-12-11 (×2): 25 mg via ORAL
  Filled 2021-12-10 (×2): qty 10

## 2021-12-10 MED ORDER — ROPIVACAINE HCL 5 MG/ML IJ SOLN
INTRAMUSCULAR | Status: DC | PRN
Start: 2021-12-10 — End: 2021-12-10
  Administered 2021-12-10: 20 mL via PERINEURAL

## 2021-12-10 MED ORDER — POVIDONE-IODINE 10 % EX SWAB
2.0000 "application " | Freq: Once | CUTANEOUS | Status: AC
  Administered 2021-12-10: 2 via TOPICAL

## 2021-12-10 MED ORDER — HYDROMORPHONE HCL 1 MG/ML IJ SOLN: 0.2500 mg | INTRAMUSCULAR | Status: DC | PRN

## 2021-12-10 MED ORDER — ONDANSETRON HCL 4 MG PO TABS: 4.0000 mg | ORAL_TABLET | Freq: Four times a day (QID) | ORAL | Status: DC | PRN

## 2021-12-10 MED ORDER — MENTHOL 3 MG MT LOZG: 1.0000 | LOZENGE | OROMUCOSAL | Status: DC | PRN

## 2021-12-10 MED ORDER — PROPOFOL 500 MG/50ML IV EMUL
INTRAVENOUS | Status: DC | PRN
  Administered 2021-12-10: 50 ug/kg/min via INTRAVENOUS

## 2021-12-10 MED ORDER — STERILE WATER FOR IRRIGATION IR SOLN
Status: DC | PRN
  Administered 2021-12-10: 2000 mL

## 2021-12-10 MED ORDER — PHENOL 1.4 % MT LIQD: 1.0000 | OROMUCOSAL | Status: DC | PRN

## 2021-12-10 MED ORDER — KETOROLAC TROMETHAMINE 30 MG/ML IJ SOLN
INTRAMUSCULAR | Status: AC
  Filled 2021-12-10: qty 1

## 2021-12-10 MED ORDER — 0.9 % SODIUM CHLORIDE (POUR BTL) OPTIME
TOPICAL | Status: DC | PRN
  Administered 2021-12-10: 1000 mL

## 2021-12-10 MED ORDER — BUPIVACAINE HCL 0.25 % IJ SOLN
INTRAMUSCULAR | Status: DC | PRN
  Administered 2021-12-10: 30 mL

## 2021-12-10 MED ORDER — POTASSIUM CHLORIDE IN NACL 20-0.45 MEQ/L-% IV SOLN
INTRAVENOUS | Status: DC
  Filled 2021-12-10 (×3): qty 1000

## 2021-12-10 MED ORDER — MIRTAZAPINE 15 MG PO TABS
30.0000 mg | ORAL_TABLET | Freq: Every day | ORAL | Status: DC
  Administered 2021-12-10: 30 mg via ORAL
  Filled 2021-12-10: qty 2

## 2021-12-10 MED ORDER — ONDANSETRON HCL 4 MG/2ML IJ SOLN: 4.0000 mg | Freq: Four times a day (QID) | INTRAMUSCULAR | Status: DC | PRN

## 2021-12-10 MED ORDER — IPRATROPIUM BROMIDE 0.06 % NA SOLN: 2.0000 | Freq: Four times a day (QID) | NASAL | Status: DC | PRN

## 2021-12-10 SURGICAL SUPPLY — 73 items
BAG COUNTER SPONGE SURGICOUNT (BAG) IMPLANT
BAG SPEC THK2 15X12 ZIP CLS (MISCELLANEOUS) ×1
BAG SPNG CNTER NS LX DISP (BAG)
BAG ZIPLOCK 12X15 (MISCELLANEOUS) ×3 IMPLANT
BANDAGE ESMARK 6X9 LF (GAUZE/BANDAGES/DRESSINGS) ×2 IMPLANT
BLADE SURG 15 STRL LF DISP TIS (BLADE) ×2 IMPLANT
BLADE SURG 15 STRL SS (BLADE) ×2
BNDG CMPR 9X6 STRL LF SNTH (GAUZE/BANDAGES/DRESSINGS) ×1
BNDG CMPR MED 15X6 ELC VLCR LF (GAUZE/BANDAGES/DRESSINGS) ×1
BNDG ELASTIC 4X5.8 VLCR STR LF (GAUZE/BANDAGES/DRESSINGS) ×1 IMPLANT
BNDG ELASTIC 6X15 VLCR STRL LF (GAUZE/BANDAGES/DRESSINGS) ×3 IMPLANT
BNDG ESMARK 6X9 LF (GAUZE/BANDAGES/DRESSINGS) ×2
BOWL SMART MIX CTS (DISPOSABLE) ×3 IMPLANT
BRNG TIB SM 9 PHS 3 RT MEN (Miscellaneous) ×1 IMPLANT
CEMENT BONE R 1X40 (Cement) ×3 IMPLANT
CLSR STERI-STRIP ANTIMIC 1/2X4 (GAUZE/BANDAGES/DRESSINGS) ×3 IMPLANT
COVER SURGICAL LIGHT HANDLE (MISCELLANEOUS) ×3 IMPLANT
CUFF TOURN SGL QUICK 34 (TOURNIQUET CUFF) ×2
CUFF TRNQT CYL 34X4.125X (TOURNIQUET CUFF) ×2 IMPLANT
DECANTER SPIKE VIAL GLASS SM (MISCELLANEOUS) IMPLANT
DRAPE EXTREMITY T 121X128X90 (DISPOSABLE) ×3 IMPLANT
DRAPE POUCH INSTRU U-SHP 10X18 (DRAPES) ×3 IMPLANT
DRAPE SHEET LG 3/4 BI-LAMINATE (DRAPES) ×3 IMPLANT
DRAPE U-SHAPE 47X51 STRL (DRAPES) ×3 IMPLANT
DRESSING MEPILEX FLEX 4X4 (GAUZE/BANDAGES/DRESSINGS) IMPLANT
DRSG MEPILEX BORDER 4X8 (GAUZE/BANDAGES/DRESSINGS) ×3 IMPLANT
DRSG MEPILEX FLEX 4X4 (GAUZE/BANDAGES/DRESSINGS) ×2
DRSG PAD ABDOMINAL 8X10 ST (GAUZE/BANDAGES/DRESSINGS) ×4 IMPLANT
DURAPREP 26ML APPLICATOR (WOUND CARE) ×6 IMPLANT
ELECT REM PT RETURN 15FT ADLT (MISCELLANEOUS) ×3 IMPLANT
FACESHIELD WRAPAROUND (MASK) ×2 IMPLANT
FACESHIELD WRAPAROUND OR TEAM (MASK) ×2 IMPLANT
GLOVE SRG 8 PF TXTR STRL LF DI (GLOVE) ×2 IMPLANT
GLOVE SURG ENC MOIS LTX SZ7 (GLOVE) ×3 IMPLANT
GLOVE SURG POLYISO LF SZ7.5 (GLOVE) ×3 IMPLANT
GLOVE SURG UNDER POLY LF SZ7 (GLOVE) ×3 IMPLANT
GLOVE SURG UNDER POLY LF SZ8 (GLOVE) ×2
GOWN STRL REUS W/TWL LRG LVL3 (GOWN DISPOSABLE) ×6 IMPLANT
HANDPIECE INTERPULSE COAX TIP (DISPOSABLE) ×2
HDLS TROCR DRIL PIN KNEE 75 (PIN) ×2
HOLDER FOLEY CATH W/STRAP (MISCELLANEOUS) IMPLANT
HOOD PEEL AWAY FLYTE STAYCOOL (MISCELLANEOUS) ×6 IMPLANT
IMMOBILIZER KNEE 20 (SOFTGOODS) ×2
IMMOBILIZER KNEE 20 THIGH 36 (SOFTGOODS) IMPLANT
IMMOBILIZER KNEE 22 UNIV (SOFTGOODS) IMPLANT
KIT BASIN OR (CUSTOM PROCEDURE TRAY) ×3 IMPLANT
KIT TURNOVER KIT A (KITS) IMPLANT
KNEE MENISC BRG SML RT 9MM THK (Miscellaneous) ×1 IMPLANT
NDL SAFETY ECLIPSE 18X1.5 (NEEDLE) ×2 IMPLANT
NEEDLE HYPO 18GX1.5 SHARP (NEEDLE) ×2
NS IRRIG 1000ML POUR BTL (IV SOLUTION) ×3 IMPLANT
PACK BLADE SAW RECIP 70 3 PT (BLADE) ×1 IMPLANT
PACK TOTAL JOINT (CUSTOM PROCEDURE TRAY) ×3 IMPLANT
PEG FEMORAL PEGGED STRL SM (Knees) ×1 IMPLANT
PIN DRILL HDLS TROCAR 75 4PK (PIN) IMPLANT
PROTECTOR NERVE ULNAR (MISCELLANEOUS) ×3 IMPLANT
SET HNDPC FAN SPRY TIP SCT (DISPOSABLE) ×2 IMPLANT
STRIP CLOSURE SKIN 1/2X4 (GAUZE/BANDAGES/DRESSINGS) ×2 IMPLANT
SUCTION FRAZIER HANDLE 12FR (TUBING) ×2
SUCTION TUBE FRAZIER 12FR DISP (TUBING) ×2 IMPLANT
SUT VIC AB 1 CT1 36 (SUTURE) ×3 IMPLANT
SUT VIC AB 2-0 CT1 27 (SUTURE) ×2
SUT VIC AB 2-0 CT1 TAPERPNT 27 (SUTURE) ×2 IMPLANT
SUT VIC AB 3-0 SH 8-18 (SUTURE) ×3 IMPLANT
SYR 30ML LL (SYRINGE) ×3 IMPLANT
SYR 3ML LL SCALE MARK (SYRINGE) ×3 IMPLANT
TOWEL OR 17X26 10 PK STRL BLUE (TOWEL DISPOSABLE) ×3 IMPLANT
TOWEL OR NON WOVEN STRL DISP B (DISPOSABLE) ×3 IMPLANT
TRAY FOLEY MTR SLVR 16FR STAT (SET/KITS/TRAYS/PACK) ×3 IMPLANT
TRAY TIBIA MEDIAL OXFORD SZ A (Joint) ×1 IMPLANT
TUBE SUCTION HIGH CAP CLEAR NV (SUCTIONS) ×3 IMPLANT
WATER STERILE IRR 1000ML POUR (IV SOLUTION) ×3 IMPLANT
WRAP KNEE MAXI GEL POST OP (GAUZE/BANDAGES/DRESSINGS) ×3 IMPLANT

## 2021-12-10 NOTE — Progress Notes (Signed)
Received Patient into 1441. Patient Alert and Oriented x 4. Patient placed on continuous pulse ox monitor. Right Pedal pulse checked. Movement in tact. Knee brace on.

## 2021-12-10 NOTE — Progress Notes (Signed)
Assisted Dr. Rose with right, ultrasound guided, adductor canal block. Side rails up, monitors on throughout procedure. See vital signs in flow sheet. Tolerated Procedure well.  

## 2021-12-10 NOTE — Interval H&P Note (Signed)
History and Physical Interval Note:  12/10/2021 12:57 PM  Emily Wu  has presented today for surgery, with the diagnosis of djd right knee.  The various methods of treatment have been discussed with the patient and family. After consideration of risks, benefits and other options for treatment, the patient has consented to  Procedure(s): UNICOMPARTMENTAL KNEE (Right) as a surgical intervention.  The patient's history has been reviewed, patient examined, no change in status, stable for surgery.  I have reviewed the patient's chart and labs.  Questions were answered to the patient's satisfaction.     Emily Wu

## 2021-12-10 NOTE — Anesthesia Procedure Notes (Signed)
Anesthesia Procedure Image    

## 2021-12-10 NOTE — Plan of Care (Signed)
Plan of care discussed. KI on loose to hold ice.

## 2021-12-10 NOTE — Anesthesia Procedure Notes (Signed)
Anesthesia Regional Block: Adductor canal block   Pre-Anesthetic Checklist: , timeout performed,  Correct Patient, Correct Site, Correct Laterality,  Correct Procedure, Correct Position, site marked,  Risks and benefits discussed,  Surgical consent,  Pre-op evaluation,  At surgeon's request and post-op pain management  Laterality: Right  Prep: chloraprep       Needles:  Injection technique: Single-shot  Needle Type: Echogenic Needle     Needle Length: 9cm      Additional Needles:   Procedures:,,,, ultrasound used (permanent image in chart),,    Narrative:  Start time: 12/10/2021 12:22 PM End time: 12/10/2021 12:29 PM Injection made incrementally with aspirations every 5 mL.  Performed by: Personally  Anesthesiologist: Myrtie Soman, MD  Additional Notes: Patient tolerated the procedure well without complications

## 2021-12-10 NOTE — Anesthesia Preprocedure Evaluation (Signed)
Anesthesia Evaluation  Patient identified by MRN, date of birth, ID band Patient awake    Reviewed: Allergy & Precautions, NPO status , Patient's Chart, lab work & pertinent test results  Airway Mallampati: II  TM Distance: >3 FB Neck ROM: Full    Dental no notable dental hx.    Pulmonary neg pulmonary ROS, former smoker,    Pulmonary exam normal breath sounds clear to auscultation       Cardiovascular hypertension, Pt. on medications Normal cardiovascular exam Rhythm:Regular Rate:Normal     Neuro/Psych negative neurological ROS  negative psych ROS   GI/Hepatic Neg liver ROS, GERD  ,  Endo/Other  Hypothyroidism   Renal/GU   negative genitourinary   Musculoskeletal  (+) Arthritis , Osteoarthritis,    Abdominal   Peds negative pediatric ROS (+)  Hematology negative hematology ROS (+)   Anesthesia Other Findings   Reproductive/Obstetrics negative OB ROS                             Anesthesia Physical Anesthesia Plan  ASA: 2  Anesthesia Plan: Spinal   Post-op Pain Management: Regional block   Induction: Intravenous  PONV Risk Score and Plan: 2 and Ondansetron, Treatment may vary due to age or medical condition and Dexamethasone  Airway Management Planned: Simple Face Mask  Additional Equipment:   Intra-op Plan:   Post-operative Plan:   Informed Consent: I have reviewed the patients History and Physical, chart, labs and discussed the procedure including the risks, benefits and alternatives for the proposed anesthesia with the patient or authorized representative who has indicated his/her understanding and acceptance.     Dental advisory given  Plan Discussed with: CRNA and Surgeon  Anesthesia Plan Comments:         Anesthesia Quick Evaluation

## 2021-12-10 NOTE — Anesthesia Procedure Notes (Signed)
Spinal  Patient location during procedure: OR Start time: 12/10/2021 2:14 PM Reason for block: surgical anesthesia Staffing Performed: resident/CRNA  Anesthesiologist: Myrtie Soman, MD Resident/CRNA: Gerald Leitz, CRNA Preanesthetic Checklist Completed: patient identified, IV checked, site marked, risks and benefits discussed, surgical consent, monitors and equipment checked, pre-op evaluation and timeout performed Spinal Block Patient position: sitting Prep: DuraPrep Patient monitoring: heart rate, continuous pulse ox, blood pressure and cardiac monitor Approach: midline Location: L4-5 Injection technique: single-shot Needle Needle type: Whitacre and Introducer  Needle gauge: 25 G Needle length: 9 cm Assessment Sensory level: T4 Events: CSF return Additional Notes Negative paresthesia. Negative blood return. Positive free-flowing CSF. Expiration date of kit checked and confirmed. Patient tolerated procedure well, without complications.

## 2021-12-10 NOTE — Op Note (Signed)
12/10/2021  3:41 PM  PATIENT:  Emily Wu    PRE-OPERATIVE DIAGNOSIS: Right knee severe anteromedial osteoarthritis  POST-OPERATIVE DIAGNOSIS:  Same  PROCEDURE:  Unicompartmental Knee Arthroplasty  SURGEON:  Johnny Bridge, MD  PHYSICIAN ASSISTANT: Merlene Pulling, PA-C, present and scrubbed throughout the case, critical for completion in a timely fashion, and for retraction, instrumentation, and closure.  ANESTHESIA:   Spinal  ESTIMATED BLOOD LOSS: 100 mL  UNIQUE ASPECTS OF THE CASE: I cut the tibia off of the 2 mm shim using the spoon as a reference, and despite this I had a very large gap.  The MCL was palpable and felt like it was intact.  The meniscus had been ground down completely and was no longer present.  The medial femur was eburnated.  There was a similar kissing lesion to the contralateral side where the varus deformity had been severe enough that it affected the medial aspect of the lateral femoral condyle.  The patellofemoral joint was still relatively well-preserved and the central and lateral portions of the lateral compartment were completely intact.  The ACL was intact.  The size 9 felt right, and tracked well, I have never used a size 9 in my career, and I was overly concerned about the MCL making sure that there was no iatrogenic damage, and I inspected it and palpated it and it felt like it was intact.  I do not know why the tibial cut using the guide was so deep, it did not look that deep, but in the end it was very difficult to extricate, which makes me think that it was in fact that deep.  PREOPERATIVE INDICATIONS:  Emily Wu is a  88 y.o. female with a diagnosis of djd right knee who failed conservative measures and elected for surgical management.    The risks benefits and alternatives were discussed with the patient preoperatively including but not limited to the risks of infection, bleeding, nerve injury, cardiopulmonary complications, blood clots, the  need for revision surgery, among others, and the patient was willing to proceed.  OPERATIVE IMPLANTS: Biomet Oxford mobile bearing medial compartment arthroplasty femur size small, tibia size A, bearing size 9.  OPERATIVE FINDINGS: Endstage grade 4 medial compartment osteoarthritis. No significant changes in the lateral or patellofemoral joint.  The ACL was intact.  OPERATIVE PROCEDURE: The patient was brought to the operating room placed in the supine position. Anesthesia was administered. IV antibiotics were given. The lower extremity was placed in the legholder and prepped and draped in usual sterile fashion.  Time out was performed.  The leg was elevated and exsanguinated and the tourniquet was inflated. Anteromedial incision was performed, and I took care to preserve the MCL. Parapatellar incision was carried out, and the osteophytes were excised, along with the medial meniscus and a small portion of the fat pad.  The extra medullary tibial cutting jig was applied, using the spoon and the 45mm G-Clamp and the 2 mm shim, and I took care to protect the anterior cruciate ligament insertion and the tibial spine. The medial collateral ligament was also protected, and I resected my proximal tibia, matching the anatomic slope.  It was very difficult to extricate the tibial piece, and the end I think it was deeper than I typically cut.  The proximal tibial bony cut was removed and I turned my attention to the femur.  The intramedullary femoral rod was placed using the drill, and then using the appropriate reference, I assembled the femoral  jig, setting my posterior cutting block. I resected my posterior femur, used the 0 spigot for the anterior femur, and then measured my gap.   I then used the appropriate mill to match the extension gap to the flexion gap. The second milling was at a 4.  The gaps were then measured again with the appropriate feeler gauges. Once I had balanced flexion and extension  gaps, I then completed the preparation of the femur.  I milled off the anterior aspect of the distal femur to prevent impingement. I also exposed the tibia, and selected the above-named component, and then used the cutting jig to prepare the keel slot on the tibia. I also used the awl to curette out the bone to complete the preparation of the keel. The back wall was intact.  I then placed trial components, and it was found to have excellent motion, and appropriate balance.  I then cemented the components into place, cementing the tibia first, removing all excess cement, and then cementing the femur.  All loose cement was removed.  The real polyethylene insert was applied manually, and the knee was taken through functional range of motion, and found to have excellent stability and restoration of joint motion, with excellent balance.  The wounds were irrigated copiously, and the parapatellar tissue closed with Vicryl, followed by Vicryl for the subcutaneous tissue, with routine closure with Steri-Strips and sterile gauze.  The tourniquet was released, and the patient was awakened and extubated and returned to PACU in stable and satisfactory condition. There were no complications.

## 2021-12-10 NOTE — Discharge Instructions (Signed)

## 2021-12-10 NOTE — Transfer of Care (Signed)
Immediate Anesthesia Transfer of Care Note  Patient: Emily Wu  Procedure(s) Performed: Procedure(s): UNICOMPARTMENTAL KNEE (Right)  Patient Location: PACU  Anesthesia Type:Spinal  Level of Consciousness: awake, alert  and oriented  Airway & Oxygen Therapy: Patient Spontanous Breathing  Post-op Assessment: Report given to RN and Post -op Vital signs reviewed and stable  Post vital signs: Reviewed and stable  Last Vitals:  Vitals:   12/10/21 1311 12/10/21 1316  BP:  (!) 124/52  Pulse: 62 (!) 59  Resp: (!) 29 (!) 21  Temp:    SpO2: 475% 830%    Complications: No apparent anesthesia complications

## 2021-12-11 ENCOUNTER — Encounter (HOSPITAL_COMMUNITY): Payer: Self-pay | Admitting: Orthopedic Surgery

## 2021-12-11 DIAGNOSIS — I10 Essential (primary) hypertension: Secondary | ICD-10-CM | POA: Diagnosis not present

## 2021-12-11 DIAGNOSIS — M1711 Unilateral primary osteoarthritis, right knee: Secondary | ICD-10-CM | POA: Diagnosis not present

## 2021-12-11 DIAGNOSIS — Z87891 Personal history of nicotine dependence: Secondary | ICD-10-CM | POA: Diagnosis not present

## 2021-12-11 DIAGNOSIS — Z96652 Presence of left artificial knee joint: Secondary | ICD-10-CM | POA: Diagnosis not present

## 2021-12-11 DIAGNOSIS — Z79899 Other long term (current) drug therapy: Secondary | ICD-10-CM | POA: Diagnosis not present

## 2021-12-11 DIAGNOSIS — Z8541 Personal history of malignant neoplasm of cervix uteri: Secondary | ICD-10-CM | POA: Diagnosis not present

## 2021-12-11 LAB — CBC
HCT: 34.6 % — ABNORMAL LOW (ref 36.0–46.0)
Hemoglobin: 11.7 g/dL — ABNORMAL LOW (ref 12.0–15.0)
MCH: 32.4 pg (ref 26.0–34.0)
MCHC: 33.8 g/dL (ref 30.0–36.0)
MCV: 95.8 fL (ref 80.0–100.0)
Platelets: 208 10*3/uL (ref 150–400)
RBC: 3.61 MIL/uL — ABNORMAL LOW (ref 3.87–5.11)
RDW: 13.6 % (ref 11.5–15.5)
WBC: 11.9 10*3/uL — ABNORMAL HIGH (ref 4.0–10.5)
nRBC: 0 % (ref 0.0–0.2)

## 2021-12-11 LAB — BASIC METABOLIC PANEL
Anion gap: 7 (ref 5–15)
BUN: 18 mg/dL (ref 8–23)
CO2: 23 mmol/L (ref 22–32)
Calcium: 8.6 mg/dL — ABNORMAL LOW (ref 8.9–10.3)
Chloride: 107 mmol/L (ref 98–111)
Creatinine, Ser: 0.97 mg/dL (ref 0.44–1.00)
GFR, Estimated: 56 mL/min — ABNORMAL LOW (ref >60)
Glucose, Bld: 157 mg/dL — ABNORMAL HIGH (ref 70–99)
Potassium: 3.9 mmol/L (ref 3.5–5.1)
Sodium: 137 mmol/L (ref 135–145)

## 2021-12-11 MED ORDER — CHLORHEXIDINE GLUCONATE CLOTH 2 % EX PADS: 6.0000 | MEDICATED_PAD | Freq: Every day | CUTANEOUS | Status: DC

## 2021-12-11 MED ORDER — ONDANSETRON HCL 4 MG PO TABS: 4.0000 mg | ORAL_TABLET | Freq: Three times a day (TID) | ORAL | 0 refills | Status: DC | PRN

## 2021-12-11 MED ORDER — DOCUSATE SODIUM 100 MG PO CAPS: 100.0000 mg | ORAL_CAPSULE | Freq: Two times a day (BID) | ORAL | 0 refills | Status: AC

## 2021-12-11 MED ORDER — HYDROCODONE-ACETAMINOPHEN 5-325 MG PO TABS: 1.0000 | ORAL_TABLET | Freq: Four times a day (QID) | ORAL | 0 refills | Status: DC | PRN

## 2021-12-11 MED ORDER — ASPIRIN EC 325 MG PO TBEC: 325.0000 mg | DELAYED_RELEASE_TABLET | Freq: Two times a day (BID) | ORAL | 0 refills | Status: DC

## 2021-12-11 NOTE — Evaluation (Signed)
Physical Therapy Evaluation Patient Details Name: Emily Wu MRN: [id number] DOB: 1930/12/15 Today's Date: 12/11/2021  History of Present Illness  Pt is an 88 year old female s/p  right unicompartmental knee replacement on 12/10/21  Clinical Impression  Pt is s/p right uniknee resulting in the deficits listed below (see PT Problem List). Pt will benefit from skilled PT to increase their independence and safety with mobility to allow discharge to the venue listed below.  Pt up in bathroom with nurse tech on arrival and assisted with ambulating in hallway.  Pt plans to d/c home later today.  Will return to review HEP.      Recommendations for follow up therapy are one component of a multi-disciplinary discharge planning process, led by the attending physician.  Recommendations may be updated based on patient status, additional functional criteria and insurance authorization.  Follow Up Recommendations Follow physician's recommendations for discharge plan and follow up therapies    Assistance Recommended at Discharge Set up Supervision/Assistance  Patient can return home with the following       Equipment Recommendations Rolling walker (2 wheels)  Recommendations for Other Services       Functional Status Assessment Patient has had a recent decline in their functional status and demonstrates the ability to make significant improvements in function in a reasonable and predictable amount of time.     Precautions / Restrictions Precautions Precautions: Fall;Knee Restrictions Weight Bearing Restrictions: No RLE Weight Bearing: Weight bearing as tolerated      Mobility  Bed Mobility               General bed mobility comments: pt in bathroom with nurse tech on arrival    Transfers Overall transfer level: Needs assistance Equipment used: Rolling walker (2 wheels) Transfers: Sit to/from Stand Sit to Stand: Min guard           General transfer comment: verbal cues  for UE and LE positioning    Ambulation/Gait Ambulation/Gait assistance: Min guard Gait Distance (Feet): 200 Feet Assistive device: Rolling walker (2 wheels) Gait Pattern/deviations: Step-to pattern, Decreased stance time - right, Antalgic       General Gait Details: verbal cues for sequence, RW positioning, max cues for posture - pt tends to forward flex at trunk  Stairs            Wheelchair Mobility    Modified Rankin (Stroke Patients Only)       Balance                                             Pertinent Vitals/Pain Pain Assessment Pain Assessment: 0-10 Pain Score: 2  Pain Location: right knee Pain Descriptors / Indicators: Aching, Sore Pain Intervention(s): Monitored during session, Repositioned    Home Living Family/patient expects to be discharged to:: Private residence Living Arrangements: Alone Available Help at Discharge: Family;Available 24 hours/day (daughters to rotate care upon pt d/c) Type of Home: House Home Access: Ramped entrance       Home Layout: One level Home Equipment: Rollator (4 wheels)      Prior Function Prior Level of Function : Independent/Modified Independent                     Hand Dominance        Extremity/Trunk Assessment        Lower Extremity Assessment  Lower Extremity Assessment: RLE deficits/detail RLE Deficits / Details: observed at least 60* right knee AROM with pt sitting in recliner       Communication   Communication: No difficulties  Cognition Arousal/Alertness: Awake/alert Behavior During Therapy: WFL for tasks assessed/performed Overall Cognitive Status: Within Functional Limits for tasks assessed                                          General Comments      Exercises     Assessment/Plan    PT Assessment Patient needs continued PT services  PT Problem List Decreased strength;Decreased range of motion;Decreased balance;Decreased  mobility;Decreased knowledge of precautions;Pain;Decreased knowledge of use of DME       PT Treatment Interventions Balance training;Gait training;DME instruction;Therapeutic exercise;Functional mobility training;Therapeutic activities;Patient/family education    PT Goals (Current goals can be found in the Care Plan section)  Acute Rehab PT Goals PT Goal Formulation: With patient Time For Goal Achievement: 12/18/21 Potential to Achieve Goals: Good    Frequency 7X/week     Co-evaluation               AM-PAC PT "6 Clicks" Mobility  Outcome Measure Help needed turning from your back to your side while in a flat bed without using bedrails?: A Little Help needed moving from lying on your back to sitting on the side of a flat bed without using bedrails?: A Little Help needed moving to and from a bed to a chair (including a wheelchair)?: A Little Help needed standing up from a chair using your arms (e.g., wheelchair or bedside chair)?: A Little Help needed to walk in hospital room?: A Little Help needed climbing 3-5 steps with a railing? : A Little 6 Click Score: 18    End of Session   Activity Tolerance: Patient tolerated treatment well Patient left: in chair;with call bell/phone within reach   PT Visit Diagnosis: Other abnormalities of gait and mobility (R26.89)    Time: 3888-7579 PT Time Calculation (min) (ACUTE ONLY): 15 min   Charges:   PT Evaluation $PT Eval Low Complexity: 1 Low         Kati PT, DPT Acute Rehabilitation Services Pager: [phone] Office: [phone]   Myrtis Hopping [place] 12/11/2021, 12:42 PM

## 2021-12-11 NOTE — Progress Notes (Signed)
Physical Therapy Treatment Patient Details Name: Emily Wu MRN: [id number] DOB: 01/01/1931 Today's Date: 12/11/2021   History of Present Illness Pt is an 88 year old female s/p  right unicompartmental knee replacement on 12/10/21    PT Comments    Pt ambulated in hallway and performed LE exercises.  Pt provided with HEP handout.  Pt had no further questions and feels ready for d/c home today.    Recommendations for follow up therapy are one component of a multi-disciplinary discharge planning process, led by the attending physician.  Recommendations may be updated based on patient status, additional functional criteria and insurance authorization.  Follow Up Recommendations  Follow physician's recommendations for discharge plan and follow up therapies     Assistance Recommended at Discharge Set up Supervision/Assistance  Patient can return home with the following     Equipment Recommendations  Rolling walker (2 wheels)    Recommendations for Other Services       Precautions / Restrictions Precautions Precautions: Fall;Knee Restrictions Weight Bearing Restrictions: No RLE Weight Bearing: Weight bearing as tolerated     Mobility  Bed Mobility               General bed mobility comments: pt in recliner    Transfers Overall transfer level: Needs assistance Equipment used: Rolling walker (2 wheels) Transfers: Sit to/from Stand Sit to Stand: Min guard           General transfer comment: verbal cues for UE and LE positioning    Ambulation/Gait Ambulation/Gait assistance: Min guard Gait Distance (Feet): 160 Feet Assistive device: Rolling walker (2 wheels) Gait Pattern/deviations: Step-to pattern, Decreased stance time - right, Antalgic       General Gait Details: verbal cues for sequence, RW positioning, max cues for posture - pt tends to forward flex at trunk   Stairs             Wheelchair Mobility    Modified Rankin (Stroke Patients  Only)       Balance                                            Cognition Arousal/Alertness: Awake/alert Behavior During Therapy: WFL for tasks assessed/performed Overall Cognitive Status: Within Functional Limits for tasks assessed                                          Exercises Total Joint Exercises Ankle Circles/Pumps: AROM, Both, 10 reps Quad Sets: AROM, Both, 10 reps Heel Slides: AAROM, Right, 10 reps Hip ABduction/ADduction: AAROM, Right, 10 reps Straight Leg Raises: AAROM, Right, 10 reps    General Comments        Pertinent Vitals/Pain Pain Assessment Pain Assessment: 0-10 Pain Score: 3  Pain Location: right knee Pain Descriptors / Indicators: Aching, Sore Pain Intervention(s): Monitored during session, Repositioned, Ice applied    Home Living Family/patient expects to be discharged to:: Private residence Living Arrangements: Alone Available Help at Discharge: Family;Available 24 hours/day (daughters to rotate care upon pt d/c) Type of Home: House Home Access: Ramped entrance       Home Layout: One level Home Equipment: Rollator (4 wheels)      Prior Function            PT Goals (current  goals can now be found in the care plan section) Acute Rehab PT Goals PT Goal Formulation: With patient Time For Goal Achievement: 12/18/21 Potential to Achieve Goals: Good Progress towards PT goals: Progressing toward goals    Frequency    7X/week      PT Plan Current plan remains appropriate    Co-evaluation              AM-PAC PT "6 Clicks" Mobility   Outcome Measure  Help needed turning from your back to your side while in a flat bed without using bedrails?: A Little Help needed moving from lying on your back to sitting on the side of a flat bed without using bedrails?: A Little Help needed moving to and from a bed to a chair (including a wheelchair)?: A Little Help needed standing up from a chair using  your arms (e.g., wheelchair or bedside chair)?: A Little Help needed to walk in hospital room?: A Little Help needed climbing 3-5 steps with a railing? : A Little 6 Click Score: 18    End of Session Equipment Utilized During Treatment: Gait belt Activity Tolerance: Patient tolerated treatment well Patient left: in chair;with call bell/phone within reach Nurse Communication: Mobility status PT Visit Diagnosis: Other abnormalities of gait and mobility (R26.89)     Time: 1343-1400 PT Time Calculation (min) (ACUTE ONLY): 17 min  Charges:  $Therapeutic Exercise: 8-22 mins                     Jannette Spanner PT, DPT Acute Rehabilitation Services Pager: [phone] Office: [place] 12/11/2021, 3:00 PM

## 2021-12-11 NOTE — TOC Transition Note (Signed)
Transition of Care [facility]) - CM/SW Discharge Note  Patient Details  Name: TIAJUANA LEPPANEN MRN: [id number] Date of Birth: [birth year]  Transition of Care [facility]) CM/SW Contact:  Sherie Don, LCSW Phone Number: 12/11/2021, 10:08 AM  Clinical Narrative: Patient is expected to discharge home after working with PT. CSW met with patient to review discharge plan and needs. Patient will discharge home with HHPT through [place]. Patient has a rollator at home, but will need a rolling walker. [employer] delivered walker to patient's room. TOC signing off.  Final next level of care: [place] Barriers to Discharge: No Barriers Identified  Patient Goals and CMS Choice Patient states their goals for this hospitalization and ongoing recovery are:: Discharge home with [place] CMS Medicare.gov Compare Post Acute Care list provided to:: Patient Choice offered to / list presented to : Patient  Discharge Plan and Services        DME Arranged: Walker rolling DME Agency: [employer] Date DME Agency Contacted: 12/11/21 Representative spoke with at DME Agency: [place]: PT [place]: [place] Representative spoke with at [place] in orthopedist's office  Readmission Risk Interventions No flowsheet data found.

## 2021-12-11 NOTE — Anesthesia Postprocedure Evaluation (Signed)
Anesthesia Post Note  Patient: Emily Wu  Procedure(s) Performed: UNICOMPARTMENTAL KNEE (Right: Knee)     Patient location during evaluation: PACU Anesthesia Type: Spinal Level of consciousness: oriented and awake and alert Pain management: pain level controlled Vital Signs Assessment: post-procedure vital signs reviewed and stable Respiratory status: spontaneous breathing, respiratory function stable and patient connected to nasal cannula oxygen Cardiovascular status: blood pressure returned to baseline and stable Postop Assessment: no headache, no backache and no apparent nausea or vomiting Anesthetic complications: no   No notable events documented.  Last Vitals:  Vitals:   12/11/21 0530 12/11/21 0804  BP: (!) 119/55 113/62  Pulse: 75 65  Resp: 17 15  Temp: 36.7 C 36.8 C  SpO2: 98% 97%    Last Pain:  Vitals:   12/11/21 1043  TempSrc:   PainSc: 0-No pain                 Avilene Marrin S

## 2021-12-11 NOTE — Progress Notes (Signed)
° ° ° °  Subjective: 1 Day Post-Op s/p Procedure(s): UNICOMPARTMENTAL KNEE   Patient is alert, oriented. Patient reports pain as 2/10.   Denies chest pain, SOB, Calf pain. No nausea/vomiting. No other complaints.   Objective:  PE: VITALS:   Vitals:   12/10/21 2115 12/11/21 0122 12/11/21 0530 12/11/21 0804  BP: (!) 104/47 (!) 105/55 (!) 119/55 113/62  Pulse: 75 75 75 65  Resp: 17 17 17 15   Temp: 98.1 F (36.7 C) 97.7 F (36.5 C) 98 F (36.7 C) 98.2 F (36.8 C)  TempSrc: Oral Oral Oral Oral  SpO2: 96% 98% 98% 97%  Weight:      Height:        ABD soft Sensation intact distally Intact pulses distally Dorsiflexion/Plantar flexion intact Incision: dressing C/D/I  LABS  Results for orders placed or performed during the hospital encounter of 12/10/21 (from the past 24 hour(s))  CBC     Status: Abnormal   Collection Time: 12/11/21  3:14 AM  Result Value Ref Range   WBC 11.9 (H) 4.0 - 10.5 K/uL   RBC 3.61 (L) 3.87 - 5.11 MIL/uL   Hemoglobin 11.7 (L) 12.0 - 15.0 g/dL   HCT 34.6 (L) 36.0 - 46.0 %   MCV 95.8 80.0 - 100.0 fL   MCH 32.4 26.0 - 34.0 pg   MCHC 33.8 30.0 - 36.0 g/dL   RDW 13.6 11.5 - 15.5 %   Platelets 208 150 - 400 K/uL   nRBC 0.0 0.0 - 0.2 %  Basic metabolic panel     Status: Abnormal   Collection Time: 12/11/21  3:14 AM  Result Value Ref Range   Sodium 137 135 - 145 mmol/L   Potassium 3.9 3.5 - 5.1 mmol/L   Chloride 107 98 - 111 mmol/L   CO2 23 22 - 32 mmol/L   Glucose, Bld 157 (H) 70 - 99 mg/dL   BUN 18 8 - 23 mg/dL   Creatinine, Ser 0.97 0.44 - 1.00 mg/dL   Calcium 8.6 (L) 8.9 - 10.3 mg/dL   GFR, Estimated 56 (L) >60 mL/min   Anion gap 7 5 - 15    DG Knee Right Port  Result Date: 12/10/2021 CLINICAL DATA:  Partial knee replacement EXAM: PORTABLE RIGHT KNEE - 1-2 VIEW COMPARISON:  06/08/2019 FINDINGS: Partial right knee replacement of the medial joint space. Prosthesis in satisfactory position. No fracture or complication. Degenerative change in  the lateral joint and patellofemoral joint. Fluid and gas in the joint. IMPRESSION: Medial joint replacement right knee. Electronically Signed   By: Franchot Gallo M.D.   On: 12/10/2021 17:05    Assessment/Plan: Principal Problem:   Osteoarthritis of right knee Active Problems:   S/P right unicompartmental knee replacement    1 Day Post-Op s/p Procedure(s): UNICOMPARTMENTAL KNEE  Weightbearing: WBAT RLE Insicional and dressing care: Dressings left intact until follow-up VTE prophylaxis: Aspirin 325mg  BID  x 30 days Pain control: limit narcotics as much as possible, continue current regimen Follow - up plan: 2 weeks with Dr. Mardelle Matte Dispo: pending PT eval, if able to pass pt, d/c home this afternoon  Contact information:   Weekdays 8-5 Merlene Pulling, PA-C [phone] A fter hours and holidays please check [email or web address] for group call information for [facility]  Ventura Bruns 12/11/2021, 8:55 AM

## 2021-12-11 NOTE — Progress Notes (Signed)
Provided discharge education/instructions, all questions and concerns addressed, Pt not in acute distress. RW delivered to room. Pt to discharge home with belongings accompanied by daughter.

## 2021-12-11 NOTE — Plan of Care (Signed)

## 2021-12-11 NOTE — Discharge Summary (Signed)
Discharge Summary  Patient ID: Emily Wu MRN: [id number] DOB/AGE: August 14, 1931 88 y.o.  Admit date: 12/10/2021 Discharge date: 12/11/2021  Admission Diagnoses:  Osteoarthritis of right knee  Discharge Diagnoses:  Principal Problem:   Osteoarthritis of right knee Active Problems:   S/P right unicompartmental knee replacement   Past Medical History:  Diagnosis Date   Arthritis    "knees, shoulders, chest" (02/03/2014)   Bruises easily    Cervical cancer ([place]) 1965   Concussion 03/11/2020   High cholesterol    History of kidney stones    Hypertension    DENIES    Tendency toward bleeding easily [facility])     Surgeries: Procedure(s): UNICOMPARTMENTAL KNEE on 12/10/2021   Consultants (if any):   Discharged Condition: Improved  Hospital Course: Emily Wu is an 88 y.o. female who was admitted 12/10/2021 with a diagnosis of Osteoarthritis of right knee and went to the operating room on 12/10/2021 and underwent the above named procedures.    She was given perioperative antibiotics:  Anti-infectives (From admission, onward)    Start     Dose/Rate Route Frequency Ordered Stop   12/10/21 2000  ceFAZolin (ANCEF) IVPB 1 g/50 mL premix        1 g 100 mL/hr over 30 Minutes Intravenous Every 6 hours 12/10/21 1852 12/11/21 0212   12/10/21 1130  ceFAZolin (ANCEF) IVPB 2g/100 mL premix        2 g 200 mL/hr over 30 Minutes Intravenous On call to O.R. 12/10/21 1119 12/10/21 1431     .  She was given sequential compression devices, early ambulation, and aspirin for DVT prophylaxis.  She benefited maximally from the hospital stay and there were no complications.    Recent vital signs:  Vitals:   12/11/21 0804 12/11/21 1232  BP: 113/62 110/65  Pulse: 65 72  Resp: 15 15  Temp: 98.2 F (36.8 C) 97.9 F (36.6 C)  SpO2: 97% 96%    Recent laboratory studies:  Lab Results  Component Value Date   HGB 11.7 (L) 12/11/2021   HGB 13.3 11/29/2021   HGB 13.1 10/22/2021   Lab  Results  Component Value Date   WBC 11.9 (H) 12/11/2021   PLT 208 12/11/2021   Lab Results  Component Value Date   INR 1.04 12/08/2018   Lab Results  Component Value Date   NA 137 12/11/2021   K 3.9 12/11/2021   CL 107 12/11/2021   CO2 23 12/11/2021   BUN 18 12/11/2021   CREATININE 0.97 12/11/2021   GLUCOSE 157 (H) 12/11/2021    Discharge Medications:   Allergies as of 12/11/2021       Reactions   Tramadol Anaphylaxis, Other (See Comments)   Lowered Blood Pressure   Amoxicillin Rash   Promethazine Hcl Rash   Sulfa Drugs Cross Reactors Rash        Medication List     TAKE these medications    ACETAMINOPHEN ER PO Take 500 mg by mouth daily.   aspirin EC 325 MG tablet Take 1 tablet (325 mg total) by mouth 2 (two) times daily.   colestipol 1 g tablet Commonly known as: COLESTID Take 1 g by mouth 2 (two) times daily.   docusate sodium 100 MG capsule Commonly known as: Colace Take 1 capsule (100 mg total) by mouth 2 (two) times daily. To prevent constipation while taking pain medication.   donepezil 5 MG tablet Commonly known as: ARICEPT Take 1 tablet (5 mg total) by mouth at  bedtime. What changed: how much to take   HYDROcodone-acetaminophen 5-325 MG tablet Commonly known as: Norco Take 1-2 tablets by mouth every 6 (six) hours as needed for severe pain. MAXIMUM TOTAL ACETAMINOPHEN DOSE IS 4000 MG PER DAY Notes to patient: Last dose given 01/18 12:40pm   ipratropium 0.06 % nasal spray Commonly known as: ATROVENT Place 2 sprays into both nostrils 4 (four) times daily. FOR RUNNY NOSE Notes to patient: Resume home regimen   liothyronine 5 MCG tablet Commonly known as: CYTOMEL TAKE ONE TABLET EVERY DAY   meclizine 25 MG tablet Commonly known as: ANTIVERT Take 25 mg by mouth 2 (two) times daily as needed for dizziness. Notes to patient: Resume home regimen   mirtazapine 30 MG tablet Commonly known as: REMERON TAKE ONE TABLET (30 MG) BY MOUTH AT  BEDTIME   ondansetron 4 MG tablet Commonly known as: Zofran Take 1 tablet (4 mg total) by mouth every 8 (eight) hours as needed for nausea or vomiting.   rosuvastatin 10 MG tablet Commonly known as: Crestor Take 1 tablet (10 mg total) by mouth daily.   vitamin B-12 1000 MCG tablet Commonly known as: CYANOCOBALAMIN Take 1,000 mcg by mouth daily. Notes to patient: Resume home regimen        Diagnostic Studies: DG Knee Right Port  Result Date: 12/10/2021 CLINICAL DATA:  Partial knee replacement EXAM: PORTABLE RIGHT KNEE - 1-2 VIEW COMPARISON:  06/08/2019 FINDINGS: Partial right knee replacement of the medial joint space. Prosthesis in satisfactory position. No fracture or complication. Degenerative change in the lateral joint and patellofemoral joint. Fluid and gas in the joint. IMPRESSION: Medial joint replacement right knee. Electronically Signed   By: Franchot Gallo M.D.   On: 12/10/2021 17:05    Disposition: Discharge disposition: 06-Home-Health Care Svc          Follow-up Information     Marchia Bond, MD. Go on 12/23/2021.   Specialty: Orthopedic Surgery Why: your appointment is scheduled at 11:15. Contact information: [address] [place] [address] [phone]         [facility] [place] Follow up.   Specialty: Home Health Services Why: HHPT will provide 6 home visits prior to you starting outpatient physical therapy Contact information: [place] [address] [phone]         [facility]. Go on 12/25/2021.   Why: Your appointment is scheduled for 2:30 Contact information: [phone]                 Signed: Ventura Bruns PA-C 12/11/2021, 4:41 PM

## 2021-12-23 DIAGNOSIS — M545 Low back pain, unspecified: Secondary | ICD-10-CM | POA: Diagnosis not present

## 2021-12-23 DIAGNOSIS — M1711 Unilateral primary osteoarthritis, right knee: Secondary | ICD-10-CM | POA: Diagnosis not present

## 2021-12-24 ENCOUNTER — Ambulatory Visit: Payer: Medicare HMO | Attending: Orthopedic Surgery | Admitting: Physical Therapy

## 2021-12-24 ENCOUNTER — Other Ambulatory Visit: Payer: Self-pay

## 2021-12-24 ENCOUNTER — Encounter: Payer: Self-pay | Admitting: Physical Therapy

## 2021-12-24 DIAGNOSIS — M25561 Pain in right knee: Secondary | ICD-10-CM | POA: Insufficient documentation

## 2021-12-24 NOTE — Therapy (Addendum)
[facility] [address], [place], [address] Phone: [phone]   Fax:  [phone]  Physical Therapy Evaluation  Patient Details  Name: Emily Wu MRN: [id number] Date of Birth: 04/05/1931  Referring Provider (PT): Marchia Bond   Encounter Date: 12/24/2021   [facility] PT Assessment - 12/25/21 1138       Assessment   Medical Diagnosis right knee pain    Referring Provider (PT) Marchia Bond    Onset Date/Surgical Date 12/10/21    Hand Dominance Right      Precautions   Precautions None      Restrictions   Weight Bearing Restrictions No    Other Position/Activity Restrictions no               PT End of Session - 12/24/21 1603     Visit Number 1    Number of Visits 18    Authorization - Visit Number 1    Authorization - Number of Visits 18    PT Start Time 0539    PT Stop Time 1200    PT Time Calculation (min) 45 min    Equipment Utilized During Treatment Gait belt    Activity Tolerance Patient tolerated treatment well    Behavior During Therapy WFL for tasks assessed/performed             Past Medical History:  Diagnosis Date   Arthritis    "knees, shoulders, chest" (02/03/2014)   Bruises easily    Cervical cancer ([place]) 1965   Concussion 03/11/2020   High cholesterol    History of kidney stones    Hypertension    DENIES    Tendency toward bleeding easily [facility])     Past Surgical History:  Procedure Laterality Date   [place] ARTHROSCOPY Left    [place]   PARTIAL KNEE ARTHROPLASTY Left 01/26/2019   Procedure: UNICOMPARTMENTAL KNEE;  Surgeon: Marchia Bond, MD;  Location: [place];  Service: Orthopedics;  Laterality: Left;   PARTIAL KNEE ARTHROPLASTY Right 12/10/2021   Procedure: UNICOMPARTMENTAL KNEE;  Surgeon: Marchia Bond, MD;  Location: [facility];  Service: Orthopedics;  Laterality: Right;   SHOULDER OPEN  ROTATOR CUFF REPAIR Bilateral 2007,  2010   [place]   THROAT SURGERY  2014   "arthritis [place]" (02/03/2014)    There were no vitals filed for this visit.    Subjective Assessment - 12/24/21 1120     Subjective pnt left knee medial pain, no n/t , dull achy comes and goes, 2/10, 0/10, 8/10. no steps at home that she uses, 2 steps goin ginto home. swing leg and mannual move leg to get into car. switched from walker to rollator. pnt walking pai into doctor office. standing 2  min, no pain sitting. nasiads dor pain. hasnt tried to walk up any steps. sleeps good at night. discomfort getting into pain. no falls in the last 6 months .    Pertinent History 88 y.o. female pnt presents with right knee pain following a right unicompartmental knee replacement on 12/10/21. [name] reports intermitent "dull" pain that does not travel and denys N/T. pnt reports currently 2/10 pain on NPS, best 0/10, and at worst 8/10. [name] has 2 low steps leading into her home that do not give her any issues. [name] aggrevating factors include getting into and out of a car (pnt has to swing her  leg into car with her arms), getting into bed, standing for more than 2 minutes, and getting up from a chair. pnt takes NSAIDs for pain relief. Prior to surgury, pnt had caretaker 5x a week from 8am-2pm. Now patient requires additional help from the hours of 4pm-8pm. [name] was ambulating with a single point can prior to surgury and has now been alternating betweeen a rolator and the single point cane. Prior to surgury pnt required a chair to shower and get herself ready at the bathroom sink.  [name] reports getting max help from her caretakers to complete ADLs. [name] reports no falls within the last 6 months and goals include increaing right knee ROM. [name] denys any sudden weight change, B/B changes, n/v, fever, and night pain.    Limitations Lifting;Walking;House hold activities;Standing    How long can you sit comfortably? unlimted    How long can you stand  comfortably? 2 minutes    How long can you walk comfortably? limited    Patient Stated Goals increase knee ROM    Currently in Pain? Yes    Pain Score 2     Pain Location Knee    Pain Orientation Right    Pain Descriptors / Indicators Aching    Pain Type Surgical pain    Pain Onset 1 to 4 weeks ago    Pain Frequency Intermittent    Aggravating Factors  [name] aggrevating factors include getting into and out of a car (pnt has to swing her leg into car with her arms), getting into bed, standing for more than 2 minutes, and getting up from a chair.    Pain Relieving Factors NSAIDs                Posture   Excessive thoracic kyphosis  Upper crossed syndrome Excessive decrease in lumbar lordosis  Lower crossed syndrome     Gait  With [facility]- that is patient SPC from prior to surgery, but is too tall for patient, patient is aware and does not wish to have it adjusted Decreased gait speed- self selected .34 m/s Decreased dorsi flexion bilateral R/L: approx 0 degrees  Decreased R stance time and L step length    Hip AROM/PROM  flexion: L: WFL, R:  WFL  Knee AROM/PROM  Flexion : R: 91/94. L: 110/116 Extension : R: 25/20. L: 10/ 7  *pt guarded with resistance to all passive movements, difficult to obtain tur PROM measurements   PAM   Patellar mobs: unable to assess due to pain   AP: unable to assess due to pain   PA : firm end feel with pnt reported "stiffness" and no concordant pain   Knee MMT  Flexion : R: 3-/5. L: 4-/5 Extension : R 3-/5. L: 4/5 *within available range   Hip MMT  Abduction : R: 3/5. L: 3/5   STS Assessment: gowers sign, able to complete without UE support unable to complete without using pushing thighs against the mat  10 meter walk test: .34 m/s   5x STS: about 15min to complete, unable to complete within true test confines (push off of BLEs against mat)  Incision site not viewed per patient request, stitches removed yesterday without sign of  infection per patient, will attempt to view site next visit  There ex  PT reviewed the following HEP with patient with patient able to demonstrate a set of the following with min cuing for correction needed. PT educated patient on parameters of therex (how/when to inc/decrease  intensity, frequency, rep/set range, stretch hold time, and purpose of therex) with verbalized understanding.  Prone knee extension x12 3x a day  Quad sets x12 3x a day  Sit to stand 3 x 8 twice a week Seated knee flexion 12x 3x a day               Objective measurements completed on examination: See above findings.                PT Education - 12/24/21 1603     Education Details prognosis and HEP    Person(s) Educated Patient    Methods Explanation;Demonstration    Comprehension Verbalized understanding;Returned demonstration              PT Short Term Goals - 12/24/21 1615       PT SHORT TERM GOAL #1   Title pnt will decrease max NPS number from 8/10 to 6/10 to show clinically significant decrease in pain.    Baseline 12/24/21 8/10    Time 4    Period Weeks    Status New    Target Date 01/21/22      PT SHORT TERM GOAL #2   Title pnt will increase right knee PROM to WNL in order to demonstrate joint integrity needed for normalized gait and transfers    Baseline 12/24/21 PROM 94 - 20    Time 4    Period Weeks    Status New    Target Date 01/21/22      PT SHORT TERM GOAL #3   Title Patient will demonstrate independence with HEP for strengthening and balance to decrease fall risk    Baseline 12/25/21 HEP given    Time 4    Period Weeks    Status New    Target Date 01/21/22               PT Long Term Goals - 12/24/21 1622       PT LONG TERM GOAL #1   Title Patient will increase FOTO score to 57 to demonstrate predicted increase in functional mobility to complete ADLs    Baseline 12/24/21 29    Time 8    Period Weeks    Status New    Target Date 02/18/22       PT LONG TERM GOAL #2   Title [name] will decrease 5x sit to stand to 14.8 seconds to meet clinically significant norms to decrease community fall risk.    Baseline 12/24/21 unable    Time 8    Period Weeks    Status New    Target Date 02/18/22      PT LONG TERM GOAL #3   Title [name] will imrove 10 meter walk test with single point cane to 0.8 m/s to show limited household ambulation speed.    Baseline 12/24/21 .34 m/s    Time 8    Period Weeks    Status New    Target Date 02/18/22      PT LONG TERM GOAL #4   Title [name] will increase gross R knee, and bilat hip abd MMT to 4/5 to increase strength required for functional ADLs    Baseline 12/24/21 right flexion and extension: 3-/5; bilat hip abd 3/5    Time 8    Period Weeks    Status New      PT LONG TERM GOAL #5   Title Patient will demonstrate R knee AROM of at least 110-10d to demonstrate symmetrical  mobility needed for modI with gait and basic transfers    Baseline 12/24/21 91-25d    Time 8    Period Weeks    Status New                    Plan - 12/24/21 1604     Clinical Impression Statement pnt presents with acute right knee pain and fall risks S/P right unilateral knee replacement. [name] impairments include decreased  right knee ROM, right knee flexion MMT, right knee extension MMT, bilateral hip abductors MMT, abnormal gait, abnormal posture, pain, and static and dynamic balance. The listed deficits decrease pnt ability to complete functional and household ADLs due to activity limitations of standing, walking, completing basic transfers, lifting, and carrying [name] tolerated therex well with good motivation and demonstrated good verbal understanding as well as demonstration of HEP. [name] requires skilled physical therapy to address the listed deficits to increase strength, increase right knee ROM, improve pain, and improve balance in order to safely ambulate and function in the community and at home and to decrease fall risk     Personal Factors and Comorbidities Age;Fitness;Past/Current Experience;Comorbidity 3+;Time since onset of injury/illness/exacerbation    Comorbidities HLD, OA multiple joints, GERD, CKD    Examination-Activity Limitations Bathing;Carry;Lift;Stand;Bed Mobility;Locomotion Level;Bend;Squat;Hygiene/Grooming;Stairs    Examination-Participation Restrictions Cleaning;Community Activity;Dorita Sciara    Stability/Clinical Decision Making Evolving/Moderate complexity    Clinical Decision Making Moderate    Rehab Potential Good    PT Frequency 2x / week    PT Duration 8 weeks    PT Treatment/Interventions ADLs/Self Care Home Management;Aquatic Therapy;Cryotherapy;Electrical Stimulation;Biofeedback;Iontophoresis 4mg /ml Dexamethasone;Moist Heat;Traction;Ultrasound;Parrafin;Fluidtherapy;Contrast Bath;Gait training;Stair training;Functional mobility training;Therapeutic activities;Therapeutic exercise;Balance training;Neuromuscular re-education;Patient/family education;Manual techniques;Compression bandaging;Passive range of motion;Dry needling;Energy conservation;Taping;Spinal Manipulations;Joint Manipulations    PT Next Visit Plan increase knee ROM    PT Home Exercise Plan quad sets, sit to stands, prone knee extension, seated knee flexion    Consulted and Agree with Plan of Care Patient             Patient will benefit from skilled therapeutic intervention in order to improve the following deficits and impairments:  Abnormal gait, Decreased activity tolerance, Decreased endurance, Decreased range of motion, Decreased strength, Hypomobility, Increased fascial restricitons, Improper body mechanics, Pain, Decreased balance, Decreased coordination, Decreased mobility, Difficulty walking, Postural dysfunction  Visit Diagnosis: Acute pain of right knee     Problem List Patient Active Problem List   Diagnosis Date Noted   S/P right unicompartmental knee replacement 12/10/2021   Bronchitis  10/31/2021   Other fatigue 10/22/2021   Pancreatic insufficiency 10/22/2021   Encounter for preoperative assessment 09/11/2021   Aortic atherosclerosis ([place]) 09/11/2021   Benign positional vertigo, right 09/13/2020   Mixed Alzheimer's and vascular dementia ([place]) 09/13/2020   Generalized muscle weakness 09/13/2020   Pulmonary nodule less than 1 cm in diameter with low risk for malignant neoplasm 07/26/2020   Depression 07/26/2020   B12 deficiency 07/26/2020   Abnormal posture 04/26/2020   Widowed 03/11/2020   Osteoarthritis of right knee 08/26/2019   Orthostatic dizziness 08/18/2019   S/P left unicompartmental knee replacement 01/26/2019   Hospital discharge follow-up 12/26/2018   Incidental pulmonary nodule, > 71mm and < 11mm 12/26/2018   Chronic kidney disease (CKD), stage III (moderate) 12/26/2018   Left bundle branch block (LBBB) determined by electrocardiography 12/02/2018   Encounter for pre-operative cardiovascular clearance 12/02/2018   GERD (gastroesophageal reflux disease) 09/29/2018   Anxiety disorder 03/25/2018   Diarrhea due to malabsorption 04/18/2017  Primary osteoarthritis of right knee 10/13/2016   Chronic pain of right knee 05/27/2016   Knee pain, bilateral 05/27/2016   Exertional dyspnea 04/15/2016   Left carotid bruit 04/14/2016   Menopausal hot flushes 08/07/2015   H/O malignant neoplasm of skin 06/25/2015   Hypothyroidism 01/31/2015   History of cervical cancer 01/10/2015   Hyperlipidemia 02/03/2014   Medicare annual wellness visit, subsequent 01/19/2014   History of IBS 01/19/2014   S/P breast augmentation 01/19/2014   Primary osteoarthritis of left knee 07/06/2012   Colon polyps 01/30/2012   Low back pain 01/30/2012     Durwin Reges DPT Claiborne Billings O'Daniel, SPT Durwin Reges, PT 12/25/2021, 11:39 AM  [facility] [address] [place], [address] Phone: [phone]   Fax:   [phone]  Name: Emily Wu MRN: [id number] Date of Birth: 09/08/1931

## 2021-12-25 ENCOUNTER — Ambulatory Visit: Payer: Medicare Other | Admitting: Physical Therapy

## 2021-12-26 ENCOUNTER — Ambulatory Visit: Payer: Medicare HMO | Admitting: Physical Therapy

## 2021-12-30 ENCOUNTER — Encounter: Payer: Medicare Other | Admitting: Physical Therapy

## 2022-01-01 ENCOUNTER — Encounter: Payer: Self-pay | Admitting: Physical Therapy

## 2022-01-01 ENCOUNTER — Ambulatory Visit: Payer: Medicare HMO | Attending: Orthopedic Surgery | Admitting: Physical Therapy

## 2022-01-01 ENCOUNTER — Encounter: Payer: Medicare Other | Admitting: Physical Therapy

## 2022-01-01 ENCOUNTER — Other Ambulatory Visit: Payer: Self-pay

## 2022-01-01 DIAGNOSIS — M25561 Pain in right knee: Secondary | ICD-10-CM | POA: Insufficient documentation

## 2022-01-01 DIAGNOSIS — M6281 Muscle weakness (generalized): Secondary | ICD-10-CM | POA: Insufficient documentation

## 2022-01-01 DIAGNOSIS — G8929 Other chronic pain: Secondary | ICD-10-CM | POA: Insufficient documentation

## 2022-01-01 DIAGNOSIS — M545 Low back pain, unspecified: Secondary | ICD-10-CM | POA: Insufficient documentation

## 2022-01-01 NOTE — Therapy (Signed)
[facility] [address] [place], [address] Phone: [phone]   Fax:  [phone]  Physical Therapy Treatment  Patient Details  Name: Emily Wu MRN: [id number] Date of Birth: 06-25-1931 Referring Provider (PT): Marchia Bond   Encounter Date: 01/01/2022   PT End of Session - 01/01/22 1011     Visit Number 2    Number of Visits 18    Authorization - Visit Number 2    Authorization - Number of Visits 10    PT Start Time 0945    PT Stop Time 1024    PT Time Calculation (min) 39 min    Equipment Utilized During Treatment Gait belt    Activity Tolerance Patient tolerated treatment well    Behavior During Therapy [facility] for tasks assessed/performed             Past Medical History:  Diagnosis Date   Arthritis    "knees, shoulders, chest" (02/03/2014)   Bruises easily    Cervical cancer [facility]) 1965   Concussion 03/11/2020   High cholesterol    History of kidney stones    Hypertension    DENIES    Tendency toward bleeding easily [facility])     Past Surgical History:  Procedure Laterality Date   [place] ARTHROSCOPY Left    [place]   PARTIAL KNEE ARTHROPLASTY Left 01/26/2019   Procedure: UNICOMPARTMENTAL KNEE;  Surgeon: Marchia Bond, MD;  Location: [place];  Service: Orthopedics;  Laterality: Left;   PARTIAL KNEE ARTHROPLASTY Right 12/10/2021   Procedure: UNICOMPARTMENTAL KNEE;  Surgeon: Marchia Bond, MD;  Location: [facility];  Service: Orthopedics;  Laterality: Right;   SHOULDER OPEN ROTATOR CUFF REPAIR Bilateral 2007,  2010   [place]   THROAT SURGERY  2014   "arthritis hugh knot" (02/03/2014)    There were no vitals filed for this visit.   Subjective Assessment - 01/01/22 0949     Subjective Patient reports she is not having any pain currently, is completing her HEP regularly without immediate concern.    Pertinent History 88 y.o.  female pnt presents with right knee pain following a right unicompartmental knee replacement on 12/10/21. [name] reports intermitent "dull" pain that does not travel and denys N/T. pnt reports currently 2/10 pain on NPS, best 0/10, and at worst 8/10. [name] has 2 low steps leading into her home that do not give her any issues. [name] aggrevating factors include getting into and out of a car (pnt has to swing her leg into car with her arms), getting into bed, standing for more than 2 minutes, and getting up from a chair. pnt takes NSAIDs for pain relief. Prior to surgury, pnt had caretaker 5x a week from 8am-2pm. Now patient requires additional help from the hours of 4pm-8pm. [name] was ambulating with a single point can prior to surgury and has now been alternating betweeen a rolator and the single point cane. Prior to surgury pnt required a chair to shower and get herself ready at the bathroom sink.  [name] reports getting max help from her caretakers to complete ADLs. [name] reports no falls within the last 6 months and goals include increaing right knee ROM. [name] denys any sudden weight change, B/B changes, n/v, fever, and night pain.    Limitations Lifting;Walking;House hold activities;Standing    How long can you sit comfortably? unlimted    How  long can you stand comfortably? 2 minutes    How long can you walk comfortably? limited    Patient Stated Goals increase knee ROM    Pain Onset 1 to 4 weeks ago              Ther-Ex Nustep L1 seat 6 LE's only 74mins for gentle ROM and strengthening Seated knee flex x6 for HEP review w/ 5sec hold, review of parameters for this at home with eventual understanding STS 2x 8 with max cuing for even push through RLE, and full stand with hip ext  Prone prop stretch 65min; with 3# AW at distal femur x51min education on completing at home  SAQ from bolster x10; with 2# AW 2x 10 with cuing for max  Supine heel slides flex <> ext x12 with towel under heel last reps with hold 90d -  10d LAQ seated x10; with 2# AW x10   Manual Patellar mobs 22min med-lat 60min sup-inf with education on self mobilization for break down of scare tissue                      PT Education - 01/01/22 0950     Education Details therex form/technique    Person(s) Educated Patient    Methods Explanation;Demonstration;Verbal cues    Comprehension Verbalized understanding;Returned demonstration;Verbal cues required              PT Short Term Goals - 12/24/21 1615       PT SHORT TERM GOAL #1   Title pnt will decrease max NPS number from 8/10 to 6/10 to show clinically significant decrease in pain.    Baseline 12/24/21 8/10    Time 4    Period Weeks    Status New    Target Date 01/21/22      PT SHORT TERM GOAL #2   Title pnt will increase right knee PROM to WNL in order to demonstrate joint integrity needed for normalized gait and transfers    Baseline 12/24/21 PROM 94 - 20    Time 4    Period Weeks    Status New    Target Date 01/21/22      PT SHORT TERM GOAL #3   Title Patient will demonstrate independence with HEP for strengthening and balance to decrease fall risk    Baseline 12/25/21 HEP given    Time 4    Period Weeks    Status New    Target Date 01/21/22               PT Long Term Goals - 12/24/21 1622       PT LONG TERM GOAL #1   Title Patient will increase FOTO score to 57 to demonstrate predicted increase in functional mobility to complete ADLs    Baseline 12/24/21 29    Time 8    Period Weeks    Status New    Target Date 02/18/22      PT LONG TERM GOAL #2   Title [name] will decrease 5x sit to stand to 14.8 seconds to meet clinically significant norms to decrease community fall risk.    Baseline 12/24/21 unable    Time 8    Period Weeks    Status New    Target Date 02/18/22      PT LONG TERM GOAL #3   Title [name] will imrove 10 meter walk test with single point cane to 0.8 m/s to show limited household ambulation speed.  Baseline  12/24/21 .34 m/s    Time 8    Period Weeks    Status New    Target Date 02/18/22      PT LONG TERM GOAL #4   Title [name] will increase gross R knee, and bilat hip abd MMT to 4/5 to increase strength required for functional ADLs    Baseline 12/24/21 right flexion and extension: 3-/5; bilat hip abd 3/5    Time 8    Period Weeks    Status New      PT LONG TERM GOAL #5   Title Patient will demonstrate R knee AROM of at least 110-10d to demonstrate symmetrical mobility needed for modI with gait and basic transfers    Baseline 12/24/21 91-25d    Time 8    Period Weeks    Status New                   Plan - 01/01/22 1048     Clinical Impression Statement PT initiated therex progression for increased R knee mobility and strengthening with success. Patient demonstrates decent demonstration of HEP exercies, requires education for parameters, with verbalized understanding following. Paiten tis able to demonstrate good carry over over of multimodal cuing with good motivation and no increased pain throughout session. Patient is apprehensive about PT touching knee, but is able to tolerate some mobilization to patella and understands education on completing this at home to reduce scar tissue and hypersensitivity. Pt with well healing incision site. PT will continue progression as able.    Personal Factors and Comorbidities Age;Fitness;Past/Current Experience;Comorbidity 3+;Time since onset of injury/illness/exacerbation    Comorbidities HLD, OA multiple joints, GERD, CKD    Examination-Activity Limitations Bathing;Carry;Lift;Stand;Bed Mobility;Locomotion Level;Bend;Squat;Hygiene/Grooming;Stairs    Examination-Participation Restrictions Cleaning;Community Activity;Dorita Sciara    Stability/Clinical Decision Making Evolving/Moderate complexity    Clinical Decision Making Moderate    Rehab Potential Good    PT Frequency 2x / week    PT Duration 8 weeks    PT Treatment/Interventions ADLs/Self  Care Home Management;Aquatic Therapy;Cryotherapy;Electrical Stimulation;Biofeedback;Iontophoresis 4mg /ml Dexamethasone;Moist Heat;Traction;Ultrasound;Parrafin;Fluidtherapy;Contrast Bath;Gait training;Stair training;Functional mobility training;Therapeutic activities;Therapeutic exercise;Balance training;Neuromuscular re-education;Patient/family education;Manual techniques;Compression bandaging;Passive range of motion;Dry needling;Energy conservation;Taping;Spinal Manipulations;Joint Manipulations    PT Next Visit Plan increase knee ROM    PT Home Exercise Plan quad sets, sit to stands, prone knee extension, seated knee flexion    Consulted and Agree with Plan of Care Patient             Patient will benefit from skilled therapeutic intervention in order to improve the following deficits and impairments:  Abnormal gait, Decreased activity tolerance, Decreased endurance, Decreased range of motion, Decreased strength, Hypomobility, Increased fascial restricitons, Improper body mechanics, Pain, Decreased balance, Decreased coordination, Decreased mobility, Difficulty walking, Postural dysfunction  Visit Diagnosis: Acute pain of right knee     Problem List Patient Active Problem List   Diagnosis Date Noted   S/P right unicompartmental knee replacement 12/10/2021   Bronchitis 10/31/2021   Other fatigue 10/22/2021   Pancreatic insufficiency 10/22/2021   Encounter for preoperative assessment 09/11/2021   Aortic atherosclerosis ([place]) 09/11/2021   Benign positional vertigo, right 09/13/2020   Mixed Alzheimer's and vascular dementia ([place]) 09/13/2020   Generalized muscle weakness 09/13/2020   Pulmonary nodule less than 1 cm in diameter with low risk for malignant neoplasm 07/26/2020   Depression 07/26/2020   B12 deficiency 07/26/2020   Abnormal posture 04/26/2020   Widowed 03/11/2020   Osteoarthritis of right knee 08/26/2019   Orthostatic dizziness 08/18/2019  S/P left unicompartmental  knee replacement 01/26/2019   Hospital discharge follow-up 12/26/2018   Incidental pulmonary nodule, > 26mm and < 30mm 12/26/2018   Chronic kidney disease (CKD), stage III (moderate) 12/26/2018   Left bundle branch block (LBBB) determined by electrocardiography 12/02/2018   Encounter for pre-operative cardiovascular clearance 12/02/2018   GERD (gastroesophageal reflux disease) 09/29/2018   Anxiety disorder 03/25/2018   Diarrhea due to malabsorption 04/18/2017   Primary osteoarthritis of right knee 10/13/2016   Chronic pain of right knee 05/27/2016   Knee pain, bilateral 05/27/2016   Exertional dyspnea 04/15/2016   Left carotid bruit 04/14/2016   Menopausal hot flushes 08/07/2015   H/O malignant neoplasm of skin 06/25/2015   Hypothyroidism 01/31/2015   History of cervical cancer 01/10/2015   Hyperlipidemia 02/03/2014   Medicare annual wellness visit, subsequent 01/19/2014   History of IBS 01/19/2014   S/P breast augmentation 01/19/2014   Primary osteoarthritis of left knee 07/06/2012   Colon polyps 01/30/2012   Low back pain 01/30/2012   Durwin Reges DPT Durwin Reges, PT 01/01/2022, 11:03 AM  [facility] [address] [place], [address] Phone: [phone]   Fax:  [phone]  Name: Emily Wu MRN: [id number] Date of Birth: February 09, 1931

## 2022-01-02 ENCOUNTER — Other Ambulatory Visit: Payer: Self-pay

## 2022-01-02 ENCOUNTER — Encounter: Payer: Self-pay | Admitting: Internal Medicine

## 2022-01-02 ENCOUNTER — Ambulatory Visit (INDEPENDENT_AMBULATORY_CARE_PROVIDER_SITE_OTHER): Payer: Medicare HMO | Admitting: Internal Medicine

## 2022-01-02 VITALS — BP 122/70 | HR 65 | Temp 98.6°F | Ht 62.0 in | Wt 121.4 lb

## 2022-01-02 DIAGNOSIS — D649 Anemia, unspecified: Secondary | ICD-10-CM | POA: Diagnosis not present

## 2022-01-02 DIAGNOSIS — K8681 Exocrine pancreatic insufficiency: Secondary | ICD-10-CM | POA: Diagnosis not present

## 2022-01-02 DIAGNOSIS — M545 Low back pain, unspecified: Secondary | ICD-10-CM | POA: Diagnosis not present

## 2022-01-02 DIAGNOSIS — R7301 Impaired fasting glucose: Secondary | ICD-10-CM | POA: Diagnosis not present

## 2022-01-02 DIAGNOSIS — G8929 Other chronic pain: Secondary | ICD-10-CM

## 2022-01-02 DIAGNOSIS — R197 Diarrhea, unspecified: Secondary | ICD-10-CM | POA: Diagnosis not present

## 2022-01-02 DIAGNOSIS — K909 Intestinal malabsorption, unspecified: Secondary | ICD-10-CM | POA: Diagnosis not present

## 2022-01-02 DIAGNOSIS — M1711 Unilateral primary osteoarthritis, right knee: Secondary | ICD-10-CM | POA: Diagnosis not present

## 2022-01-02 NOTE — Patient Instructions (Addendum)
Continue using tylenol  for your back pain.   You can take up to 2000 mg of acetominophen (tylenol) every day safely  In divided doses (500 mg every 6 hours  Or 1000 mg every 12 hours.)    You do not need a referral to return to Dr Evalee Mutton since he ordered the MRI of your lumbar spine   Return for labs in late May  .  And to see me in August

## 2022-01-02 NOTE — Progress Notes (Addendum)
Subjective:  Patient ID: Emily Wu, female    DOB: [birth year]  Age: 88 y.o. MRN: [id number]  CC: The primary encounter diagnosis was Impaired fasting glucose. Diagnoses of Diarrhea due to malabsorption, Exocrine pancreatic insufficiency, Chronic midline low back pain without sciatica, Primary osteoarthritis of right knee, and Anemia, unspecified type were also pertinent to this visit.   This visit occurred during the SARS-CoV-2 public health emergency.  Safety protocols were in place, including screening questions prior to the visit, additional usage of staff PPE, and extensive cleaning of exam room while observing appropriate contact time as indicated for disinfecting solutions.    HPI SAMON DISHNER presents for follow up on chronic and acute issues  Chief Complaint  Patient presents with   Follow-up    6 months/moles on body    1) DJD right knee She is  s/p TKR on Jan 17  by Carter Kitten  ; was discharged home the following day.   walking with a cane.   Also had a  nerve block in the left hip prior to the surgery which has relieved her hip pain   Weight is stable.  Sleeping well.  Cc is back pain:   her lumbar pain is midline  and limits  her standing to a few minutes .  no radiation,  2) also having pain in upper thoracic spine.  ('between the shoulder blades") wants to see orthopedist and murphy wainer .  Just taking tylenol   Had an MRI of lumbar spine by Dr.  Percell Boston at [facility].  Advised to schedule a follow up,    Declines flu and pneumonia vaccines  Concerned about a "mole" on her left scapular area and left chest wall   Outpatient Medications Prior to Visit  Medication Sig Dispense Refill   ACETAMINOPHEN ER PO Take 500 mg by mouth daily.     aspirin EC 325 MG tablet Take 1 tablet (325 mg total) by mouth 2 (two) times daily. 60 tablet 0   colestipol (COLESTID) 1 g tablet Take 1 g by mouth 2 (two) times daily.     docusate sodium (COLACE) 100 MG capsule Take  1 capsule (100 mg total) by mouth 2 (two) times daily. To prevent constipation while taking pain medication. 30 capsule 0   donepezil (ARICEPT) 5 MG tablet Take 1 tablet (5 mg total) by mouth at bedtime. (Patient taking differently: Take 10 mg by mouth at bedtime.) 90 tablet 1   HYDROcodone-acetaminophen (NORCO) 5-325 MG tablet Take 1-2 tablets by mouth every 6 (six) hours as needed for severe pain. MAXIMUM TOTAL ACETAMINOPHEN DOSE IS 4000 MG PER DAY 30 tablet 0   ipratropium (ATROVENT) 0.06 % nasal spray Place 2 sprays into both nostrils 4 (four) times daily. FOR RUNNY NOSE 15 mL 12   liothyronine (CYTOMEL) 5 MCG tablet TAKE ONE TABLET EVERY DAY 90 tablet 1   meclizine (ANTIVERT) 25 MG tablet Take 25 mg by mouth 2 (two) times daily as needed for dizziness.     mirtazapine (REMERON) 30 MG tablet TAKE ONE TABLET (30 MG) BY MOUTH AT BEDTIME 90 tablet 1   ondansetron (ZOFRAN) 4 MG tablet Take 1 tablet (4 mg total) by mouth every 8 (eight) hours as needed for nausea or vomiting. 10 tablet 0   rosuvastatin (CRESTOR) 10 MG tablet Take 1 tablet (10 mg total) by mouth daily. 30 tablet 5   vitamin B-12 (CYANOCOBALAMIN) 1000 MCG tablet Take 1,000 mcg by mouth daily.  No facility-administered medications prior to visit.    Review of Systems;  Patient denies headache, fevers, malaise, unintentional weight loss, skin rash, eye pain, sinus congestion and sinus pain, sore throat, dysphagia,  hemoptysis , cough, dyspnea, wheezing, chest pain, palpitations, orthopnea, edema, abdominal pain, nausea, melena, diarrhea, constipation, flank pain, dysuria, hematuria, urinary  Frequency, nocturia, numbness, tingling, seizures,  Focal weakness, Loss of consciousness,  Tremor, insomnia, depression, anxiety, and suicidal ideation.      Objective:  BP 122/70 (BP Location: Left Arm, Patient Position: Sitting, Cuff Size: Normal)    Pulse 65    Temp 98.6 F (37 C) (Oral)    Ht 5\' 2"  (1.575 m)    Wt 121 lb 6.4 oz (55.1 kg)     SpO2 99%    BMI 22.20 kg/m   BP Readings from Last 3 Encounters:  01/02/22 122/70  12/11/21 110/65  11/29/21 (!) 132/48    Wt Readings from Last 3 Encounters:  01/02/22 121 lb 6.4 oz (55.1 kg)  12/10/21 120 lb (54.4 kg)  11/29/21 120 lb (54.4 kg)    General appearance: alert, cooperative and appears stated age Ears: normal TM's and external ear canals both ears Throat: lips, mucosa, and tongue normal; teeth and gums normal Neck: no adenopathy, no carotid bruit, supple, symmetrical, trachea midline and thyroid not enlarged, symmetric, no tenderness/mass/nodules Back: symmetric, no curvature. ROM normal. No CVA tenderness. Lungs: clear to auscultation bilaterally Heart: regular rate and rhythm, S1, S2 normal, no murmur, click, rub or gallop Abdomen: soft, non-tender; bowel sounds normal; no masses,  no organomegaly Pulses: 2+ and symmetric Skin: Skin color, texture, turgor normal. No rashes or lesions other than seborrheic keraotoses and a resolving linear abrasion c/w/ scratch Lymph nodes: Cervical, supraclavicular, and axillary nodes normal.  Lab Results  Component Value Date   HGBA1C 5.7 09/11/2021   HGBA1C 5.8 08/18/2019    Lab Results  Component Value Date   CREATININE 0.97 12/11/2021   CREATININE 1.07 (H) 11/29/2021   CREATININE 1.13 10/22/2021    Lab Results  Component Value Date   WBC 11.9 (H) 12/11/2021   HGB 11.7 (L) 12/11/2021   HCT 34.6 (L) 12/11/2021   PLT 208 12/11/2021   GLUCOSE 157 (H) 12/11/2021   CHOL 161 08/18/2019   TRIG 206.0 (H) 08/18/2019   HDL 41.00 08/18/2019   LDLDIRECT 90.0 08/18/2019   LDLCALC 73 03/19/2018   ALT 14 10/22/2021   AST 17 10/22/2021   NA 137 12/11/2021   K 3.9 12/11/2021   CL 107 12/11/2021   CREATININE 0.97 12/11/2021   BUN 18 12/11/2021   CO2 23 12/11/2021   TSH 2.64 10/22/2021   INR 1.04 12/08/2018   HGBA1C 5.7 09/11/2021    DG Knee Right Port  Result Date: 12/10/2021 CLINICAL DATA:  Partial knee  replacement EXAM: PORTABLE RIGHT KNEE - 1-2 VIEW COMPARISON:  06/08/2019 FINDINGS: Partial right knee replacement of the medial joint space. Prosthesis in satisfactory position. No fracture or complication. Degenerative change in the lateral joint and patellofemoral joint. Fluid and gas in the joint. IMPRESSION: Medial joint replacement right knee. Electronically Signed   By: Franchot Gallo M.D.   On: 12/10/2021 17:05    Assessment & Plan:   Problem List Items Addressed This Visit     Anemia    Mild, occurring after surgery.  Will   Repeat labs in April      Relevant Orders   Comprehensive metabolic panel   Diarrhea due to malabsorption  Resolved with bid use of Colestid (Creon was ineffective and  expensive) .  Fat soluble vitamins need levels monitored annually at our office.       Exocrine pancreatic insufficiency    Confirmed with low  Pancreatic elastase level in September  2022,   Resulting  in chronic diarrhea and malabsorption/.  Diarrhea has resolved with use of Clestid 1 gram bid.  Follow up annually with [facility] and annual fat soluble vitamin levels are needed        Relevant Orders   Vitamin A   VITAMIN D 25 Hydroxy (Vit-D Deficiency, Fractures)   Vitamin E   Vitamin K1, Serum   Low back pain    Advised to follow up with Raliegh Ip for management with ESI if offered.  Recent MRI lumbar spine not available for review      Primary osteoarthritis of right knee    S/p unicompartmental replacement Jan 17 by Dr Mardelle Matte with good results.  She is ambulating today with a cane.      Other Visit Diagnoses     Impaired fasting glucose    -  Primary   Relevant Orders   CBC with Differential/Platelet   Hemoglobin A1c       I spent 20 minutes dedicated to the care of this patient on the date of this encounter to include pre-visit review of patient's medical history,  most recent imaging studies, Face-to-face time with the patient , and post visit ordering of  testing and therapeutics.    Follow-up: No follow-ups on file.   Crecencio Mc, MD

## 2022-01-03 ENCOUNTER — Ambulatory Visit: Payer: Medicare HMO | Admitting: Physical Therapy

## 2022-01-04 DIAGNOSIS — D649 Anemia, unspecified: Secondary | ICD-10-CM | POA: Insufficient documentation

## 2022-01-04 NOTE — Assessment & Plan Note (Addendum)
Confirmed with low  Pancreatic elastase level in September  2022,   Resulting  in chronic diarrhea and malabsorption/.  Diarrhea has resolved with use of Clestid 1 gram bid.  Follow up annually with [place] and annual fat soluble vitamin levels are needed

## 2022-01-04 NOTE — Assessment & Plan Note (Addendum)
Resolved with bid use of Colestid (Creon was ineffective and  expensive) .  Fat soluble vitamins need levels monitored annually at our office.

## 2022-01-04 NOTE — Assessment & Plan Note (Signed)
Mild, occurring after surgery.  Will   Repeat labs in April

## 2022-01-04 NOTE — Assessment & Plan Note (Signed)
S/p unicompartmental replacement Jan 17 by Dr Mardelle Matte with good results.  She is ambulating today with a cane.

## 2022-01-04 NOTE — Assessment & Plan Note (Signed)
Advised to follow up with Raliegh Ip for management with ESI if offered.  Recent MRI lumbar spine not available for review

## 2022-01-06 ENCOUNTER — Encounter: Payer: Medicare Other | Admitting: Physical Therapy

## 2022-01-07 ENCOUNTER — Other Ambulatory Visit: Payer: Medicare HMO

## 2022-01-07 ENCOUNTER — Other Ambulatory Visit: Payer: Self-pay

## 2022-01-07 DIAGNOSIS — Z515 Encounter for palliative care: Secondary | ICD-10-CM

## 2022-01-07 NOTE — Progress Notes (Signed)
PATIENT NAME: Emily Wu DOB: 03-Aug-1931 MRN: [id number]  PRIMARY CARE PROVIDER: Crecencio Mc, MD  RESPONSIBLE PARTY:  Acct ID - Guarantor Home Phone Work Phone Relationship Acct Type  [id number] MALIEA, [phone]  Self P/F     [address], [place], [place] [address]   Due to the COVID-19 crisis, this visit was done via telemedicine from my office and it was initiated and consent by this patient and or family.  I connected with  Konrad Felix OR PROXY on 01/07/22 by telephone and verified that I am speaking with the correct person using two identifiers.   I discussed the limitations of evaluation and management by telemedicine. The patient expressed understanding and agreed to proceed.   PLAN OF CARE and INTERVENTIONS:               1.  GOALS OF CARE/ ADVANCE CARE PLANNING:  Remain home with the assistance of private caregivers and family.               2.  PATIENT/CAREGIVER EDUCATION:  Safety               4. PERSONAL EMERGENCY PLAN:  Activate 911 for emergencies.                5.  DISEASE STATUS:  Connected with patient by telephone.  Recent (R) TKR in January.  Patient notes she is doing well and pleased with the outcome.  She is now participating in outpatient therapy and expects to continues thru the end of March.  Patient is able to use a cane now.  She still has her rolling walker in the home and uses this as needed.  Patient endorses keeping her cane with her when she is outside of the home. Discussed safety and patient endorses life alert is also in place.   Pain has been minimal to her knee.  She reports a "pinch feeling" from time to time but expects this will decrease over time.  Her weight is maintaining between 120-121 lbs.  Appetite remains good.  Patient has a Charity fundraiser that is in the home daily to offer assistance with ADL's and assist with meal preparations.  Patient denies any issues with diarrhea and reports this has completely  resolved.  Patient has no new concerns at this time.  Advised Palliative Care will contact her again next to complete a check-in.    HISTORY OF PRESENT ILLNESS:  88 year old female with mixed dementia.  Patient is being followed by Palliative Care monthly and PRN.   Patient is being followed by Palliative Care every 4-8 weeks and PRN.   CODE STATUS: Full ADVANCED DIRECTIVES:Yes MOST FORM: No PPS: 50%         Lorenza Burton, RN

## 2022-01-08 ENCOUNTER — Other Ambulatory Visit: Payer: Self-pay

## 2022-01-08 ENCOUNTER — Encounter: Payer: Self-pay | Admitting: Physical Therapy

## 2022-01-08 ENCOUNTER — Ambulatory Visit: Payer: Medicare HMO | Admitting: Physical Therapy

## 2022-01-08 ENCOUNTER — Encounter: Payer: Medicare Other | Admitting: Physical Therapy

## 2022-01-08 DIAGNOSIS — M545 Low back pain, unspecified: Secondary | ICD-10-CM | POA: Diagnosis not present

## 2022-01-08 DIAGNOSIS — M6281 Muscle weakness (generalized): Secondary | ICD-10-CM | POA: Diagnosis not present

## 2022-01-08 DIAGNOSIS — M25561 Pain in right knee: Secondary | ICD-10-CM | POA: Diagnosis not present

## 2022-01-08 DIAGNOSIS — G8929 Other chronic pain: Secondary | ICD-10-CM | POA: Diagnosis not present

## 2022-01-08 NOTE — Therapy (Signed)
[facility] [address] [place], [address] Phone: [phone]   Fax:  [phone]  Physical Therapy Treatment  Patient Details  Name: Emily Wu MRN: [id number] Date of Birth: [birth year] Referring Provider (PT): Marchia Bond   Encounter Date: 01/08/2022   PT End of Session - 01/08/22 1217     Visit Number 3    Number of Visits 18    Authorization - Visit Number 2    Authorization - Number of Visits 10    PT Start Time 0932    PT Stop Time 1200    PT Time Calculation (min) 45 min    Equipment Utilized During Treatment Gait belt    Activity Tolerance Patient tolerated treatment well    Behavior During Therapy [facility] for tasks assessed/performed             Past Medical History:  Diagnosis Date   Arthritis    "knees, shoulders, chest" (02/03/2014)   Bruises easily    Cervical cancer [facility]) 1965   Concussion 03/11/2020   High cholesterol    History of kidney stones    Hypertension    DENIES    Tendency toward bleeding easily [facility])     Past Surgical History:  Procedure Laterality Date   [place] ARTHROSCOPY Left    [place]   PARTIAL KNEE ARTHROPLASTY Left 01/26/2019   Procedure: UNICOMPARTMENTAL KNEE;  Surgeon: Marchia Bond, MD;  Location: [place];  Service: Orthopedics;  Laterality: Left;   PARTIAL KNEE ARTHROPLASTY Right 12/10/2021   Procedure: UNICOMPARTMENTAL KNEE;  Surgeon: Marchia Bond, MD;  Location: [facility];  Service: Orthopedics;  Laterality: Right;   SHOULDER OPEN ROTATOR CUFF REPAIR Bilateral 2007,  2010   [place]   THROAT SURGERY  2014   "arthritis hugh knot" (02/03/2014)    There were no vitals filed for this visit.   Subjective Assessment - 01/08/22 1124     Subjective Patient denies pain but does state she has some "sensitivity" in her right knee. Pt is compliant with HEP but "not faithfully". She has been  performing scar mobilization on most days.    Pertinent History 88 y.o. female pnt presents with right knee pain following a right unicompartmental knee replacement on 12/10/21. [name] reports intermitent "dull" pain that does not travel and denys N/T. pnt reports currently 2/10 pain on NPS, best 0/10, and at worst 8/10. [name] has 2 low steps leading into her home that do not give her any issues. [name] aggrevating factors include getting into and out of a car (pnt has to swing her leg into car with her arms), getting into bed, standing for more than 2 minutes, and getting up from a chair. pnt takes NSAIDs for pain relief. Prior to surgury, pnt had caretaker 5x a week from 8am-2pm. Now patient requires additional help from the hours of 4pm-8pm. [name] was ambulating with a single point can prior to surgury and has now been alternating betweeen a rolator and the single point cane. Prior to surgury pnt required a chair to shower and get herself ready at the bathroom sink.  [name] reports getting max help from her caretakers to complete ADLs. [name] reports no falls within the last 6 months and goals include increaing right knee ROM. [name] denys any sudden weight change, B/B changes, n/v, fever, and night pain.    Limitations Lifting;Walking;House hold activities;Standing  How long can you sit comfortably? unlimted    How long can you stand comfortably? 2 minutes    How long can you walk comfortably? limited    Patient Stated Goals increase knee ROM    Currently in Pain? No/denies    Pain Onset 1 to 4 weeks ago               Ther-Ex Nustep L1 seat 6 LE's only 73mins for gentle LE ROM and strengthening Supine prop stretch 3 min; with 2# AW at distal femur  SAQ with 2# AW 2x10 with cuing for correct muscle isolation and activation  STS 2x 8 with max cuing for even push through RLE and hip ext  LAQ RLE with 2# AW, 2x10 reps Seated march RLE with 2# AW, 2x10 reps Seated hamstring curl RLE using RTB, 2x10 reps    Manual Desensitization via light touch to light pressure in preparation for patellar mobs, 2 minutes Patellar mobs med-lat and sup-inf, 2 minutes each Hamstring stretch, 60 seconds x2 reps     Clinical Impression: Pt demonstrates good motivation this date although she does remain apprehensive of right knee being touched. She responded well to gentle desensitization to begin manual followed by a progressive increase in pressure and joint mobilization. PT encouraged pt to incorporate desensitization and continue scar mobilization in HEP. She had some discomfort with STS activity. Otherwise all therex interventions performed pain-free with muscular fatigue noted. She continues to lack significant knee extension. Pt did mention needing PT for her lumbar spine - PT will look into getting a referral for lumbar spine prior to initiating spine specific interventions. Patient would benefit from continued skilled therapy to address ROM and strength deficits in order to improve functional mobility and return to PLOF.        PT Short Term Goals - 12/24/21 1615       PT SHORT TERM GOAL #1   Title pnt will decrease max NPS number from 8/10 to 6/10 to show clinically significant decrease in pain.    Baseline 12/24/21 8/10    Time 4    Period Weeks    Status New    Target Date 01/21/22      PT SHORT TERM GOAL #2   Title pnt will increase right knee PROM to WNL in order to demonstrate joint integrity needed for normalized gait and transfers    Baseline 12/24/21 PROM 94 - 20    Time 4    Period Weeks    Status New    Target Date 01/21/22      PT SHORT TERM GOAL #3   Title Patient will demonstrate independence with HEP for strengthening and balance to decrease fall risk    Baseline 12/25/21 HEP given    Time 4    Period Weeks    Status New    Target Date 01/21/22               PT Long Term Goals - 12/24/21 1622       PT LONG TERM GOAL #1   Title Patient will increase FOTO score to 57 to  demonstrate predicted increase in functional mobility to complete ADLs    Baseline 12/24/21 29    Time 8    Period Weeks    Status New    Target Date 02/18/22      PT LONG TERM GOAL #2   Title [name] will decrease 5x sit to stand to 14.8 seconds to meet clinically  significant norms to decrease community fall risk.    Baseline 12/24/21 unable    Time 8    Period Weeks    Status New    Target Date 02/18/22      PT LONG TERM GOAL #3   Title [name] will imrove 10 meter walk test with single point cane to 0.8 m/s to show limited household ambulation speed.    Baseline 12/24/21 .34 m/s    Time 8    Period Weeks    Status New    Target Date 02/18/22      PT LONG TERM GOAL #4   Title [name] will increase gross R knee, and bilat hip abd MMT to 4/5 to increase strength required for functional ADLs    Baseline 12/24/21 right flexion and extension: 3-/5; bilat hip abd 3/5    Time 8    Period Weeks    Status New      PT LONG TERM GOAL #5   Title Patient will demonstrate R knee AROM of at least 110-10d to demonstrate symmetrical mobility needed for modI with gait and basic transfers    Baseline 12/24/21 91-25d    Time 8    Period Weeks    Status New                   Plan - 01/08/22 1217     Clinical Impression Statement Pt demonstrates good motivation this date although she does remain apprehensive of right knee being touched. She responded well to gentle desensitization to begin manual followed by a progressive increase in pressure and joint mobilization. PT encouraged pt to incorporate desensitization and continue scar mobilization in HEP. She had some discomfort with STS activity. Otherwise all therex interventions performed pain-free with muscular fatigue noted. She continues to lack significant knee extension. Pt did mention needing PT for her lumbar spine - PT will look into getting a referral for lumbar spine prior to initiating spine specific interventions. Patient would benefit from  continued skilled therapy to address ROM and strength deficits in order to improve functional mobility and return to PLOF.    Personal Factors and Comorbidities Age;Fitness;Past/Current Experience;Comorbidity 3+;Time since onset of injury/illness/exacerbation    Comorbidities HLD, OA multiple joints, GERD, CKD    Examination-Activity Limitations Bathing;Carry;Lift;Stand;Bed Mobility;Locomotion Level;Bend;Squat;Hygiene/Grooming;Stairs    Examination-Participation Restrictions Cleaning;Community Activity;[facility]    Stability/Clinical Decision Making Evolving/Moderate complexity    Rehab Potential Good    PT Frequency 2x / week    PT Duration 8 weeks    PT Treatment/Interventions ADLs/Self Care Home Management;Aquatic Therapy;Cryotherapy;Electrical Stimulation;Biofeedback;Iontophoresis 4mg /ml Dexamethasone;Moist Heat;Traction;Ultrasound;Parrafin;Fluidtherapy;Contrast Bath;Gait training;Stair training;Functional mobility training;Therapeutic activities;Therapeutic exercise;Balance training;Neuromuscular re-education;Patient/family education;Manual techniques;Compression bandaging;Passive range of motion;Dry needling;Energy conservation;Taping;Spinal Manipulations;Joint Manipulations    PT Next Visit Plan increase knee ROM    PT Home Exercise Plan quad sets, sit to stands, prone knee extension, seated knee flexion    Consulted and Agree with Plan of Care Patient             Patient will benefit from skilled therapeutic intervention in order to improve the following deficits and impairments:  Abnormal gait, Decreased activity tolerance, Decreased endurance, Decreased range of motion, Decreased strength, Hypomobility, Increased fascial restricitons, Improper body mechanics, Pain, Decreased balance, Decreased coordination, Decreased mobility, Difficulty walking, Postural dysfunction  Visit Diagnosis: Acute pain of right knee     Problem List Patient Active Problem List   Diagnosis  Date Noted   Anemia 01/04/2022   S/P right unicompartmental knee replacement 12/10/2021  Bronchitis 10/31/2021   Other fatigue 10/22/2021   Exocrine pancreatic insufficiency 10/22/2021   Encounter for preoperative assessment 09/11/2021   Aortic atherosclerosis ([place]) 09/11/2021   Benign positional vertigo, right 09/13/2020   Mixed Alzheimer's and vascular dementia ([place]) 09/13/2020   Generalized muscle weakness 09/13/2020   Pulmonary nodule less than 1 cm in diameter with low risk for malignant neoplasm 07/26/2020   Depression 07/26/2020   B12 deficiency 07/26/2020   Abnormal posture 04/26/2020   Widowed 03/11/2020   Osteoarthritis of right knee 08/26/2019   Orthostatic dizziness 08/18/2019   S/P left unicompartmental knee replacement 01/26/2019   Hospital discharge follow-up 12/26/2018   Incidental pulmonary nodule, > 68mm and < 65mm 12/26/2018   Chronic kidney disease (CKD), stage III (moderate) 12/26/2018   Left bundle branch block (LBBB) determined by electrocardiography 12/02/2018   Encounter for pre-operative cardiovascular clearance 12/02/2018   GERD (gastroesophageal reflux disease) 09/29/2018   Anxiety disorder 03/25/2018   Diarrhea due to malabsorption 04/18/2017   Primary osteoarthritis of right knee 10/13/2016   Chronic pain of right knee 05/27/2016   Knee pain, bilateral 05/27/2016   Exertional dyspnea 04/15/2016   Left carotid bruit 04/14/2016   Menopausal hot flushes 08/07/2015   H/O malignant neoplasm of skin 06/25/2015   Hypothyroidism 01/31/2015   History of cervical cancer 01/10/2015   Hyperlipidemia 02/03/2014   Medicare annual wellness visit, subsequent 01/19/2014   History of IBS 01/19/2014   S/P breast augmentation 01/19/2014   Primary osteoarthritis of left knee 07/06/2012   Colon polyps 01/30/2012   Low back pain 01/30/2012    Patrina Levering PT, DPT   [facility] [address], [place], [address] Phone: [phone]   Fax:  [phone]  Name: Emily Wu MRN: [id number] Date of Birth: [birth year]

## 2022-01-10 ENCOUNTER — Ambulatory Visit: Payer: Medicare HMO | Admitting: Physical Therapy

## 2022-01-13 ENCOUNTER — Ambulatory Visit: Payer: Medicare HMO | Admitting: Physical Therapy

## 2022-01-13 ENCOUNTER — Encounter: Payer: Self-pay | Admitting: Physical Therapy

## 2022-01-13 ENCOUNTER — Encounter: Payer: Medicare Other | Admitting: Physical Therapy

## 2022-01-13 ENCOUNTER — Other Ambulatory Visit: Payer: Self-pay

## 2022-01-13 DIAGNOSIS — M25561 Pain in right knee: Secondary | ICD-10-CM

## 2022-01-13 DIAGNOSIS — G8929 Other chronic pain: Secondary | ICD-10-CM | POA: Diagnosis not present

## 2022-01-13 DIAGNOSIS — M6281 Muscle weakness (generalized): Secondary | ICD-10-CM | POA: Diagnosis not present

## 2022-01-13 DIAGNOSIS — M545 Low back pain, unspecified: Secondary | ICD-10-CM | POA: Diagnosis not present

## 2022-01-13 NOTE — Therapy (Signed)
[facility] [address], [place], [address] Phone: [phone]   Fax:  [phone]  Physical Therapy Treatment  Patient Details  Name: Emily Wu MRN: [id number] Date of Birth: [birth year] Referring Provider (PT): Marchia Bond   Encounter Date: 01/13/2022   PT End of Session - 01/13/22 1028     Visit Number 4    Number of Visits 18    Authorization - Visit Number 2    Authorization - Number of Visits 10    PT Start Time 6962    PT Stop Time 1115    PT Time Calculation (min) 53 min    Equipment Utilized During Treatment Gait belt    Activity Tolerance Patient tolerated treatment well    Behavior During Therapy [facility] for tasks assessed/performed             Past Medical History:  Diagnosis Date   Arthritis    "knees, shoulders, chest" (02/03/2014)   Bruises easily    Cervical cancer [facility]) 1965   Concussion 03/11/2020   High cholesterol    History of kidney stones    Hypertension    DENIES    Tendency toward bleeding easily [facility])     Past Surgical History:  Procedure Laterality Date   [place] ARTHROSCOPY Left    [place]   PARTIAL KNEE ARTHROPLASTY Left 01/26/2019   Procedure: UNICOMPARTMENTAL KNEE;  Surgeon: Marchia Bond, MD;  Location: [facility];  Service: Orthopedics;  Laterality: Left;   PARTIAL KNEE ARTHROPLASTY Right 12/10/2021   Procedure: UNICOMPARTMENTAL KNEE;  Surgeon: Marchia Bond, MD;  Location: [facility];  Service: Orthopedics;  Laterality: Right;   SHOULDER OPEN ROTATOR CUFF REPAIR Bilateral 2007,  2010   [place]   THROAT SURGERY  2014   "arthritis hugh knot" (02/03/2014)    There were no vitals filed for this visit.   Subjective Assessment - 01/13/22 1026     Subjective Patient denies pain. She does state that her entire body is achy today and that she is tired due to a weekend full of activities.    Pertinent  History 88 y.o. female pnt presents with right knee pain following a right unicompartmental knee replacement on 12/10/21. [name] reports intermitent "dull" pain that does not travel and denys N/T. pnt reports currently 2/10 pain on NPS, best 0/10, and at worst 8/10. [name] has 2 low steps leading into her home that do not give her any issues. [name] aggrevating factors include getting into and out of a car (pnt has to swing her leg into car with her arms), getting into bed, standing for more than 2 minutes, and getting up from a chair. pnt takes NSAIDs for pain relief. Prior to surgury, pnt had caretaker 5x a week from 8am-2pm. Now patient requires additional help from the hours of 4pm-8pm. [name] was ambulating with a single point can prior to surgury and has now been alternating betweeen a rolator and the single point cane. Prior to surgury pnt required a chair to shower and get herself ready at the bathroom sink.  [name] reports getting max help from her caretakers to complete ADLs. [name] reports no falls within the last 6 months and goals include increaing right knee ROM. [name] denys any sudden weight change, B/B changes, n/v, fever, and night pain.    Limitations Lifting;Walking;House hold activities;Standing    How long can  you sit comfortably? unlimted    How long can you stand comfortably? 2 minutes    How long can you walk comfortably? limited    Patient Stated Goals increase knee ROM    Currently in Pain? No/denies    Pain Onset 1 to 4 weeks ago              LUMBAR SUBJECTIVE Chief complaint: Prolonged history of tightness/weakness in low back and periscapular region (years). Pt specifically states she does not have pain however it is uncomfortable, can become achy and leads to poor posture.  Referring Dx: lumbago   Numbness/Tingling: Yes - right leg around the knee capsule  24 hour pain behavior: more rested in the morning > pt feels decreased weakness  Aggravating factors: walking, standing Easing  factors: sitting down, lying down, rest, Tylenol  How long can you sit: unlimited  How long can you stand: <1 minute How long can you walk: <5 minutes using cane or Rollator  History of prior back injury, pain, surgery, or therapy: Yes Imaging: No - none recently for spine  Falls in the last 6 months: No  Occupational demands: retired, has caregivers    OBJECTIVE  Gross Musculoskeletal Assessment Tremor: None Bulk: Normal Tone: Normal   Posture Upper crossed syndrome: positive  Significant kyphosis Lumbar lordosis: decreased Lower crossed syndrome (tight hip flexors and erector spinae; weak gluts and abs): positive    AROM/PROM  Significantly limited extension and rotation. Limited lateral flexion.   Repeated Movements No centralization or peripheralization of symptoms with repeated lumbar extension or flexion.   Muscle Length Hamstrings: R: 75 degrees (unable to fully extend knee)  L: 75 degrees   Hip Flexors: decreased length bilaterally       Ther-Ex Nustep L1-2, seat 6, 5 minutes for gentle LE/spine ROM and strengthening; STS x5 from standard chair using BUE, 2 x5 with airex pad, max cuing for even push through RLE and hip extension LAQ BLE with 2# AW, 3x10 reps Seated march w/o back support for core activation, alternating BLE with 2# AW, 3x10 reps each  Standing hip extension, BLE using 2# AW. Significantly improved posture from baseline with BUE pulling trunk to upright.    TA activation in hook lying, 2x 10  Ambulation: PT adjusted height of SPC. Ambulation 3 x1ft using SPC with education on sequencing and emphasis on erect posture and step-through gait sequence. Weak hip and knee extension leading to mild knee buckling bilaterally, especially with upright posture.     Manual Desensitization via light touch to light pressure in preparation for patellar mobs, 2 minutes Patellar mobs med-lat and sup-inf, 2 minutes each Hamstring stretch, 60 seconds x2  reps  Supine prop stretch 2 min with with PT supporting distal extremity and providing light OP at knee.      Clinical Impression: Pt demonstrates good motivation this date. She continues to report high sensitivity near medial joint line but is not as apprehensive for PT to perform manual techniques. PT performed gentle desensitization prior to patellar mobs and OP into extension - pt educated to perform at home. PT screened lumbar spine due to referral for "lumbago" and initiated lumbar/core activation and strengthening. Pt does not have pain with PROM or AROM however is very limited in extension and rotation range. All therex interventions performed pain-free; progressed from 2 sets to 3 for strengthening. She continues to lack significant knee extension. Patient would benefit from continued skilled therapy to address ROM and strength deficits in  order to improve functional mobility and return to PLOF.         PT Short Term Goals - 12/24/21 1615       PT SHORT TERM GOAL #1   Title pnt will decrease max NPS number from 8/10 to 6/10 to show clinically significant decrease in pain.    Baseline 12/24/21 8/10    Time 4    Period Weeks    Status New    Target Date 01/21/22      PT SHORT TERM GOAL #2   Title pnt will increase right knee PROM to WNL in order to demonstrate joint integrity needed for normalized gait and transfers    Baseline 12/24/21 PROM 94 - 20    Time 4    Period Weeks    Status New    Target Date 01/21/22      PT SHORT TERM GOAL #3   Title Patient will demonstrate independence with HEP for strengthening and balance to decrease fall risk    Baseline 12/25/21 HEP given    Time 4    Period Weeks    Status New    Target Date 01/21/22               PT Long Term Goals - 12/24/21 1622       PT LONG TERM GOAL #1   Title Patient will increase FOTO score to 57 to demonstrate predicted increase in functional mobility to complete ADLs    Baseline 12/24/21 29    Time 8     Period Weeks    Status New    Target Date 02/18/22      PT LONG TERM GOAL #2   Title [name] will decrease 5x sit to stand to 14.8 seconds to meet clinically significant norms to decrease community fall risk.    Baseline 12/24/21 unable    Time 8    Period Weeks    Status New    Target Date 02/18/22      PT LONG TERM GOAL #3   Title [name] will imrove 10 meter walk test with single point cane to 0.8 m/s to show limited household ambulation speed.    Baseline 12/24/21 .34 m/s    Time 8    Period Weeks    Status New    Target Date 02/18/22      PT LONG TERM GOAL #4   Title [name] will increase gross R knee, and bilat hip abd MMT to 4/5 to increase strength required for functional ADLs    Baseline 12/24/21 right flexion and extension: 3-/5; bilat hip abd 3/5    Time 8    Period Weeks    Status New      PT LONG TERM GOAL #5   Title Patient will demonstrate R knee AROM of at least 110-10d to demonstrate symmetrical mobility needed for modI with gait and basic transfers    Baseline 12/24/21 91-25d    Time 8    Period Weeks    Status New                   Plan - 01/13/22 1612     Clinical Impression Statement Pt demonstrates good motivation this date. She continues to report high sensitivity near medial joint line but is not as apprehensive for PT to perform manual techniques. PT performed gentle desensitization prior to patellar mobs and OP into extension - pt educated to perform at home. PT screened lumbar spine due to  referral for "lumbago" and initiated lumbar/core activation and strengthening. Pt does not have pain with PROM or AROM however is very limited in extension and rotation range. All therex interventions performed pain-free; progressed from 2 sets to 3 for strengthening. She continues to lack significant knee extension. Patient would benefit from continued skilled therapy to address ROM and strength deficits in order to improve functional mobility and return to PLOF.     Personal Factors and Comorbidities Age;Fitness;Past/Current Experience;Comorbidity 3+;Time since onset of injury/illness/exacerbation    Comorbidities HLD, OA multiple joints, GERD, CKD    Examination-Activity Limitations Bathing;Carry;Lift;Stand;Bed Mobility;Locomotion Level;Bend;Squat;Hygiene/Grooming;Stairs    Examination-Participation Restrictions Cleaning;Community Activity;[facility]alla Leaver [facility]    Stability/Clinical Decision Making Evolving/Moderate complexity    Rehab Potential Good    PT Frequency 2x / week    PT Duration 8 weeks    PT Treatment/Interventions ADLs/Self Care Home Management;Aquatic Therapy;Cryotherapy;Electrical Stimulation;Biofeedback;Iontophoresis 4mg /ml Dexamethasone;Moist Heat;Traction;Ultrasound;Parrafin;Fluidtherapy;Contrast Bath;Gait training;Stair training;Functional mobility training;Therapeutic activities;Therapeutic exercise;Balance training;Neuromuscular re-education;Patient/family education;Manual techniques;Compression bandaging;Passive range of motion;Dry needling;Energy conservation;Taping;Spinal Manipulations;Joint Manipulations    PT Next Visit Plan increase knee ROM    PT Home Exercise Plan quad sets, sit to stands, prone knee extension, seated knee flexion    Consulted and Agree with Plan of Care Patient             Patient will benefit from skilled therapeutic intervention in order to improve the following deficits and impairments:  Abnormal gait, Decreased activity tolerance, Decreased endurance, Decreased range of motion, Decreased strength, Hypomobility, Increased fascial restricitons, Improper body mechanics, Pain, Decreased balance, Decreased coordination, Decreased mobility, Difficulty walking, Postural dysfunction  Visit Diagnosis: Acute pain of right knee     Problem List Patient Active Problem List   Diagnosis Date Noted   Anemia 01/04/2022   S/P right unicompartmental knee replacement 12/10/2021   Bronchitis 10/31/2021   Other  fatigue 10/22/2021   Exocrine pancreatic insufficiency 10/22/2021   Encounter for preoperative assessment 09/11/2021   Aortic atherosclerosis ([place]) 09/11/2021   Benign positional vertigo, right 09/13/2020   Mixed Alzheimer's and vascular dementia ([place]) 09/13/2020   Generalized muscle weakness 09/13/2020   Pulmonary nodule less than 1 cm in diameter with low risk for malignant neoplasm 07/26/2020   Depression 07/26/2020   B12 deficiency 07/26/2020   Abnormal posture 04/26/2020   Widowed 03/11/2020   Osteoarthritis of right knee 08/26/2019   Orthostatic dizziness 08/18/2019   S/P left unicompartmental knee replacement 01/26/2019   Hospital discharge follow-up 12/26/2018   Incidental pulmonary nodule, > 37mm and < 72mm 12/26/2018   Chronic kidney disease (CKD), stage III (moderate) 12/26/2018   Left bundle branch block (LBBB) determined by electrocardiography 12/02/2018   Encounter for pre-operative cardiovascular clearance 12/02/2018   GERD (gastroesophageal reflux disease) 09/29/2018   Anxiety disorder 03/25/2018   Diarrhea due to malabsorption 04/18/2017   Primary osteoarthritis of right knee 10/13/2016   Chronic pain of right knee 05/27/2016   Knee pain, bilateral 05/27/2016   Exertional dyspnea 04/15/2016   Left carotid bruit 04/14/2016   Menopausal hot flushes 08/07/2015   H/O malignant neoplasm of skin 06/25/2015   Hypothyroidism 01/31/2015   History of cervical cancer 01/10/2015   Hyperlipidemia 02/03/2014   Medicare annual wellness visit, subsequent 01/19/2014   History of IBS 01/19/2014   S/P breast augmentation 01/19/2014   Primary osteoarthritis of left knee 07/06/2012   Colon polyps 01/30/2012   Low back pain 01/30/2012    Patrina Levering PT, DPT   [facility] [address] [place], [address] Phone: [phone]   Fax:  [phone]  Name: Emily Wu MRN: [id number] Date of Birth:  04/03/1931

## 2022-01-15 ENCOUNTER — Ambulatory Visit: Payer: Medicare HMO | Admitting: Physical Therapy

## 2022-01-15 ENCOUNTER — Encounter: Payer: Medicare Other | Admitting: Physical Therapy

## 2022-01-17 ENCOUNTER — Other Ambulatory Visit: Payer: Self-pay

## 2022-01-17 ENCOUNTER — Ambulatory Visit: Payer: Medicare HMO | Admitting: Physical Therapy

## 2022-01-17 ENCOUNTER — Encounter: Payer: Self-pay | Admitting: Physical Therapy

## 2022-01-17 DIAGNOSIS — M25561 Pain in right knee: Secondary | ICD-10-CM

## 2022-01-17 DIAGNOSIS — M6281 Muscle weakness (generalized): Secondary | ICD-10-CM | POA: Diagnosis not present

## 2022-01-17 DIAGNOSIS — M545 Low back pain, unspecified: Secondary | ICD-10-CM | POA: Diagnosis not present

## 2022-01-17 DIAGNOSIS — G8929 Other chronic pain: Secondary | ICD-10-CM | POA: Diagnosis not present

## 2022-01-17 NOTE — Therapy (Signed)
[place] [facility] [address] [place], [address] Phone: [phone]   Fax:  [phone]  Physical Therapy Treatment  Patient Details  Name: Emily Wu MRN: [id number] Date of Birth: [birth year] Referring Provider (PT): Marchia Bond   Encounter Date: 01/17/2022   PT End of Session - 01/17/22 1024     Visit Number 5    Number of Visits 18    Date for PT Re-Evaluation 02/18/22    Progress Note Due on Visit 10    PT Start Time 1018    PT Stop Time 1105    PT Time Calculation (min) 47 min    Equipment Utilized During Treatment Gait belt    Activity Tolerance Patient tolerated treatment well    Behavior During Therapy [facility] for tasks assessed/performed             Past Medical History:  Diagnosis Date   Arthritis    "knees, shoulders, chest" (02/03/2014)   Bruises easily    Cervical cancer [facility]) 1965   Concussion 03/11/2020   High cholesterol    History of kidney stones    Hypertension    DENIES    Tendency toward bleeding easily [facility])     Past Surgical History:  Procedure Laterality Date   [place] ARTHROSCOPY Left    [place]   PARTIAL KNEE ARTHROPLASTY Left 01/26/2019   Procedure: UNICOMPARTMENTAL KNEE;  Surgeon: Marchia Bond, MD;  Location: [place] [facility];  Service: Orthopedics;  Laterality: Left;   PARTIAL KNEE ARTHROPLASTY Right 12/10/2021   Procedure: UNICOMPARTMENTAL KNEE;  Surgeon: Marchia Bond, MD;  Location[facility];  Service: Orthopedics;  Laterality: Right;   SHOULDER OPEN ROTATOR CUFF REPAIR Bilateral 2007,  2010   [place]   THROAT SURGERY  2014   "arthritis hugh knot" (02/03/2014)    There were no vitals filed for this visit.   Subjective Assessment - 01/17/22 1021     Subjective Patient denies current knee pain. States her back is "about the same" and feels tight. 3/10 back pain. States medial knee continues ot be sensitive  and that pain does occur when she is on her feet a lot.    Pertinent History 88 y.o. female pnt presents with right knee pain following a right unicompartmental knee replacement on 12/10/21. [name] reports intermitent "dull" pain that does not travel and denys N/T. pnt reports currently 2/10 pain on NPS, best 0/10, and at worst 8/10. [name] has 2 low steps leading into her home that do not give her any issues. [name] aggrevating factors include getting into and out of a car (pnt has to swing her leg into car with her arms), getting into bed, standing for more than 2 minutes, and getting up from a chair. pnt takes NSAIDs for pain relief. Prior to surgury, pnt had caretaker 5x a week from 8am-2pm. Now patient requires additional help from the hours of 4pm-8pm. [name] was ambulating with a single point can prior to surgury and has now been alternating betweeen a rolator and the single point cane. Prior to surgury pnt required a chair to shower and get herself ready at the bathroom sink.  [name] reports getting max help from her caretakers to complete ADLs. [name] reports no falls within the last 6 months and goals include increaing right knee ROM. [name] denys any sudden weight change, B/B changes, n/v, fever, and night pain.  Limitations Lifting;Walking;House hold activities;Standing    How long can you sit comfortably? unlimted    How long can you stand comfortably? 2 minutes    How long can you walk comfortably? limited    Patient Stated Goals increase knee ROM    Currently in Pain? Yes    Pain Score 3     Pain Location Back    Pain Orientation Lower    Pain Descriptors / Indicators Tightness    Pain Type Chronic pain    Pain Onset More than a month ago    Pain Frequency Constant              Re-cert date: 0/94/70  Ther-Ex Nustep L1-2, seat 6, 5 minutes for gentle LE/spine ROM and strengthening; STS 3x10 from standard chair w/ airex pad using BUE, cuing for even push through RLE and hip extension LAQ BLE  with 3# AW and no back support for core activation, 3x10 reps Seated march w/o back support for core activation, alternating BLE with 3# AW, 3x10 reps each  Standing heel raise, BUE support, 3 x15 Step over hurdles x5 using cane for SUE support, 5 sets.  -difficulty clearing RLE when leading and trailing, VC to decrease circumduction compensation.     Manual Patellar mobs med-lat and sup-inf, 2 minutes each Hamstring stretch, 60 seconds x2 reps BLE Supine prop stretch 2 min with with PT supporting distal extremity and providing light OP at knee.     Next Session: TA activation Hip flexor stretch Quad stretch  Knee flexion     Clinical Impression: Pt demonstrates good motivation this date. She continues to report sensitivity near medial joint line, however desensitization techniques no longer required prior to patellar mobs. No pain reported with STS this date so reps were increased. Resistance and standing activities were also increased for greater challenge. She remains limited in both knee flexion and extension. PT encouraged pt to reincorporate heel slides into HEP and provided OP to focus on increasing flexion. Plan to add hip flexor and quad stretch bilaterally as standing with improved posture (hip extension) leads to increased knee flexion and buckling. Patient would benefit from continued skilled therapy to address ROM and strength deficits in order to improve functional mobility and return to PLOF.            PT Short Term Goals - 12/24/21 1615       PT SHORT TERM GOAL #1   Title pnt will decrease max NPS number from 8/10 to 6/10 to show clinically significant decrease in pain.    Baseline 12/24/21 8/10    Time 4    Period Weeks    Status New    Target Date 01/21/22      PT SHORT TERM GOAL #2   Title pnt will increase right knee PROM to WNL in order to demonstrate joint integrity needed for normalized gait and transfers    Baseline 12/24/21 PROM 94 - 20    Time 4     Period Weeks    Status New    Target Date 01/21/22      PT SHORT TERM GOAL #3   Title Patient will demonstrate independence with HEP for strengthening and balance to decrease fall risk    Baseline 12/25/21 HEP given    Time 4    Period Weeks    Status New    Target Date 01/21/22               PT  Long Term Goals - 12/24/21 1622       PT LONG TERM GOAL #1   Title Patient will increase FOTO score to 57 to demonstrate predicted increase in functional mobility to complete ADLs    Baseline 12/24/21 29    Time 8    Period Weeks    Status New    Target Date 02/18/22      PT LONG TERM GOAL #2   Title [name] will decrease 5x sit to stand to 14.8 seconds to meet clinically significant norms to decrease community fall risk.    Baseline 12/24/21 unable    Time 8    Period Weeks    Status New    Target Date 02/18/22      PT LONG TERM GOAL #3   Title [name] will imrove 10 meter walk test with single point cane to 0.8 m/s to show limited household ambulation speed.    Baseline 12/24/21 .34 m/s    Time 8    Period Weeks    Status New    Target Date 02/18/22      PT LONG TERM GOAL #4   Title [name] will increase gross R knee, and bilat hip abd MMT to 4/5 to increase strength required for functional ADLs    Baseline 12/24/21 right flexion and extension: 3-/5; bilat hip abd 3/5    Time 8    Period Weeks    Status New      PT LONG TERM GOAL #5   Title Patient will demonstrate R knee AROM of at least 110-10d to demonstrate symmetrical mobility needed for modI with gait and basic transfers    Baseline 12/24/21 91-25d    Time 8    Period Weeks    Status New                   Plan - 01/17/22 1131     Clinical Impression Statement Pt demonstrates good motivation this date. She continues to report sensitivity near medial joint line, however desensitization techniques no longer required prior to patellar mobs. No pain reported with STS this date so reps were increased. Resistance and  standing activities were also increased for greater challenge. She remains limited in both knee flexion and extension. PT encouraged pt to reincorporate heel slides into HEP and provided OP to focus on increasing flexion. Plan to add hip flexor and quad stretch bilaterally as standing with improved posture (hip extension) leads to increased knee flexion and buckling. Patient would benefit from continued skilled therapy to address ROM and strength deficits in order to improve functional mobility and return to PLOF.    Personal Factors and Comorbidities Age;Fitness;Past/Current Experience;Comorbidity 3+;Time since onset of injury/illness/exacerbation    Comorbidities HLD, OA multiple joints, GERD, CKD    Examination-Activity Limitations Bathing;Carry;Lift;Stand;Bed Mobility;Locomotion Level;Bend;Squat;Hygiene/Grooming;Stairs    Examination-Participation Restrictions Cleaning;Community Activity;Valla Leaver [facility]    Stability/Clinical Decision Making Evolving/Moderate complexity    Rehab Potential Good    PT Frequency 2x / week    PT Duration 8 weeks    PT Treatment/Interventions ADLs/Self Care Home Management;Aquatic Therapy;Cryotherapy;Electrical Stimulation;Biofeedback;Iontophoresis 4mg /ml Dexamethasone;Moist Heat;Traction;Ultrasound;Parrafin;Fluidtherapy;Contrast Bath;Gait training;Stair training;Functional mobility training;Therapeutic activities;Therapeutic exercise;Balance training;Neuromuscular re-education;Patient/family education;Manual techniques;Compression bandaging;Passive range of motion;Dry needling;Energy conservation;Taping;Spinal Manipulations;Joint Manipulations    PT Next Visit Plan increase knee ROM    PT Home Exercise Plan quad sets, sit to stands, prone knee extension, seated knee flexion    Consulted and Agree with Plan of Care Patient  Patient will benefit from skilled therapeutic intervention in order to improve the following deficits and impairments:  Abnormal  gait, Decreased activity tolerance, Decreased endurance, Decreased range of motion, Decreased strength, Hypomobility, Increased fascial restricitons, Improper body mechanics, Pain, Decreased balance, Decreased coordination, Decreased mobility, Difficulty walking, Postural dysfunction  Visit Diagnosis: Acute pain of right knee  Muscle weakness (generalized)  Chronic bilateral low back pain without sciatica     Problem List Patient Active Problem List   Diagnosis Date Noted   Anemia 01/04/2022   S/P right unicompartmental knee replacement 12/10/2021   Bronchitis 10/31/2021   Other fatigue 10/22/2021   Exocrine pancreatic insufficiency 10/22/2021   Encounter for preoperative assessment 09/11/2021   Aortic atherosclerosis ([place]) 09/11/2021   Benign positional vertigo, right 09/13/2020   Mixed Alzheimer's and vascular dementia ([place]) 09/13/2020   Generalized muscle weakness 09/13/2020   Pulmonary nodule less than 1 cm in diameter with low risk for malignant neoplasm 07/26/2020   Depression 07/26/2020   B12 deficiency 07/26/2020   Abnormal posture 04/26/2020   Widowed 03/11/2020   Osteoarthritis of right knee 08/26/2019   Orthostatic dizziness 08/18/2019   S/P left unicompartmental knee replacement 01/26/2019   Hospital discharge follow-up 12/26/2018   Incidental pulmonary nodule, > 47mm and < 8mm 12/26/2018   Chronic kidney disease (CKD), stage III (moderate) 12/26/2018   Left bundle branch block (LBBB) determined by electrocardiography 12/02/2018   Encounter for pre-operative cardiovascular clearance 12/02/2018   GERD (gastroesophageal reflux disease) 09/29/2018   Anxiety disorder 03/25/2018   Diarrhea due to malabsorption 04/18/2017   Primary osteoarthritis of right knee 10/13/2016   Chronic pain of right knee 05/27/2016   Knee pain, bilateral 05/27/2016   Exertional dyspnea 04/15/2016   Left carotid bruit 04/14/2016   Menopausal hot flushes 08/07/2015   H/O malignant  neoplasm of skin 06/25/2015   Hypothyroidism 01/31/2015   History of cervical cancer 01/10/2015   Hyperlipidemia 02/03/2014   Medicare annual wellness visit, subsequent 01/19/2014   History of IBS 01/19/2014   S/P breast augmentation 01/19/2014   Primary osteoarthritis of left knee 07/06/2012   Colon polyps 01/30/2012   Low back pain 01/30/2012    Patrina Levering PT, DPT   [place] [address], [place], [address] Phone: [phone]   Fax:  [phone]  Name: Emily Wu MRN: [id number] Date of Birth: 1931/10/26

## 2022-01-20 ENCOUNTER — Encounter: Payer: Medicare Other | Admitting: Physical Therapy

## 2022-01-20 ENCOUNTER — Encounter: Payer: Medicare HMO | Admitting: Physical Therapy

## 2022-01-21 ENCOUNTER — Encounter: Payer: Self-pay | Admitting: Physical Therapy

## 2022-01-21 ENCOUNTER — Ambulatory Visit: Payer: Medicare HMO | Admitting: Physical Therapy

## 2022-01-21 ENCOUNTER — Other Ambulatory Visit: Payer: Self-pay

## 2022-01-21 DIAGNOSIS — M6281 Muscle weakness (generalized): Secondary | ICD-10-CM

## 2022-01-21 DIAGNOSIS — M545 Low back pain, unspecified: Secondary | ICD-10-CM | POA: Diagnosis not present

## 2022-01-21 DIAGNOSIS — G8929 Other chronic pain: Secondary | ICD-10-CM

## 2022-01-21 DIAGNOSIS — M25561 Pain in right knee: Secondary | ICD-10-CM | POA: Diagnosis not present

## 2022-01-21 NOTE — Therapy (Signed)
[facility] [address] [place], [address] Phone: [phone]   Fax:  [phone]  Physical Therapy Treatment  Patient Details  Name: Emily Wu MRN: [id number] Date of Birth: June 14, 1931 Referring Provider (PT): Marchia Bond   Encounter Date: 01/21/2022   PT End of Session - 01/21/22 1516     Visit Number 6    Number of Visits 17    Date for PT Re-Evaluation 02/18/22    Progress Note Due on Visit 10    PT Start Time 1347    PT Stop Time 1430    PT Time Calculation (min) 43 min    Equipment Utilized During Treatment Gait belt    Activity Tolerance Patient tolerated treatment well    Behavior During Therapy [facility] for tasks assessed/performed             Past Medical History:  Diagnosis Date   Arthritis    "knees, shoulders, chest" (02/03/2014)   Bruises easily    Cervical cancer [facility]) 1965   Concussion 03/11/2020   High cholesterol    History of kidney stones    Hypertension    DENIES    Tendency toward bleeding easily [facility])     Past Surgical History:  Procedure Laterality Date   [place] ARTHROSCOPY Left    [place]   PARTIAL KNEE ARTHROPLASTY Left 01/26/2019   Procedure: UNICOMPARTMENTAL KNEE;  Surgeon: Marchia Bond, MD;  Location: [place];  Service: Orthopedics;  Laterality: Left;   PARTIAL KNEE ARTHROPLASTY Right 12/10/2021   Procedure: UNICOMPARTMENTAL KNEE;  Surgeon: Marchia Bond, MD;  [facility];  Service: Orthopedics;  Laterality: Right;   SHOULDER OPEN ROTATOR CUFF REPAIR Bilateral 2007,  2010   [place]   THROAT SURGERY  2014   "arthritis hugh knot" (02/03/2014)    There were no vitals filed for this visit.   Subjective Assessment - 01/21/22 1510     Subjective Patient denies knee and back pain upon arrival to PT. She has been performing desensitization to the medial knee and states it feels much better.  She states she is tired this afternoon which is typical.    Pertinent History 88 y.o. female pnt presents with right knee pain following a right unicompartmental knee replacement on 12/10/21. [name] reports intermitent "dull" pain that does not travel and denys N/T. pnt reports currently 2/10 pain on NPS, best 0/10, and at worst 8/10. [name] has 2 low steps leading into her home that do not give her any issues. [name] aggrevating factors include getting into and out of a car (pnt has to swing her leg into car with her arms), getting into bed, standing for more than 2 minutes, and getting up from a chair. pnt takes NSAIDs for pain relief. Prior to surgury, pnt had caretaker 5x a week from 8am-2pm. Now patient requires additional help from the hours of 4pm-8pm. [name] was ambulating with a single point can prior to surgury and has now been alternating betweeen a rolator and the single point cane. Prior to surgury pnt required a chair to shower and get herself ready at the bathroom sink.  [name] reports getting max help from her caretakers to complete ADLs. [name] reports no falls within the last 6 months and goals include increaing right knee ROM. [name] denys any sudden weight change, B/B changes, n/v, fever, and night pain.  Limitations Lifting;Walking;House hold activities;Standing    How long can you sit comfortably? unlimted    How long can you stand comfortably? 2 minutes    How long can you walk comfortably? limited    Patient Stated Goals increase knee ROM    Currently in Pain? No/denies    Pain Onset More than a month ago               INTERVENTIONS  Ther-Ex Nustep L2-3, seat 6, 5 minutes for gentle LE/spine ROM and strengthening; Standing march, alternating with 3# AW 3x10 Standing hamstring curl, alternating with 3# AW 3x10 Standing heel raise, BUE support, 3 x15 Stairs (step-to pattern) x4 steps leading with RLEto ascend and LLE to descend, BUE support, 3 sets  Heel slide RLE, cuing for full  available ROM, 2x10 Thomas stretch RLE 2 x 60 seconds; gravity only, no OP - strong stretch in quad and hip flexors. **Added to HEP**   Manual Patellar mobs med-lat and sup-inf, 2 minutes each Hamstring stretch, 60 seconds x2 reps RLE with OP at knee to encourage extension  Single knee to chest x60 seconds  Knee flexion PROM with OP hold at end range (97 degrees) x10 reps      Next Session: TA activation Quad stretch  Knee flexion Step over hurdles x5 using cane for SUE support, 5 sets. Supine prop stretch 2 min with with PT supporting distal extremity and providing light OP at knee.  STS 3x10  LAQ BLE with 3# AW      Clinical Impression: Pt demonstrates good motivation this date although she does present with low energy. She reports significantly decreased sensitivity in the medial knee, reporting it "feels good" when PT mobs the patella. All therex performed in standing. Difficulty isolating right HS due to weakness. Decreased ROM within right knee joint and decreased flexibility of the right quad, hip flexor and hamstring. Added thomas stretch to HEP. Patient would benefit from continued skilled therapy to address ROM and strength deficits in order to improve functional mobility and return to PLOF.            PT Short Term Goals - 12/24/21 1615       PT SHORT TERM GOAL #1   Title pnt will decrease max NPS number from 8/10 to 6/10 to show clinically significant decrease in pain.    Baseline 12/24/21 8/10    Time 4    Period Weeks    Status New    Target Date 01/21/22      PT SHORT TERM GOAL #2   Title pnt will increase right knee PROM to WNL in order to demonstrate joint integrity needed for normalized gait and transfers    Baseline 12/24/21 PROM 94 - 20    Time 4    Period Weeks    Status New    Target Date 01/21/22      PT SHORT TERM GOAL #3   Title Patient will demonstrate independence with HEP for strengthening and balance to decrease fall risk    Baseline  12/25/21 HEP given    Time 4    Period Weeks    Status New    Target Date 01/21/22               PT Long Term Goals - 12/24/21 1622       PT LONG TERM GOAL #1   Title Patient will increase FOTO score to 57 to demonstrate predicted increase in functional mobility to complete  ADLs    Baseline 12/24/21 29    Time 8    Period Weeks    Status New    Target Date 02/18/22      PT LONG TERM GOAL #2   Title [name] will decrease 5x sit to stand to 14.8 seconds to meet clinically significant norms to decrease community fall risk.    Baseline 12/24/21 unable    Time 8    Period Weeks    Status New    Target Date 02/18/22      PT LONG TERM GOAL #3   Title [name] will imrove 10 meter walk test with single point cane to 0.8 m/s to show limited household ambulation speed.    Baseline 12/24/21 .34 m/s    Time 8    Period Weeks    Status New    Target Date 02/18/22      PT LONG TERM GOAL #4   Title [name] will increase gross R knee, and bilat hip abd MMT to 4/5 to increase strength required for functional ADLs    Baseline 12/24/21 right flexion and extension: 3-/5; bilat hip abd 3/5    Time 8    Period Weeks    Status New      PT LONG TERM GOAL #5   Title Patient will demonstrate R knee AROM of at least 110-10d to demonstrate symmetrical mobility needed for modI with gait and basic transfers    Baseline 12/24/21 91-25d    Time 8    Period Weeks    Status New                   Plan - 01/21/22 1506     Clinical Impression Statement Pt demonstrates good motivation this date although she does present with low energy. She reports significantly decreased sensitivity in the medial knee, reporting it "feels good" when PT mobs the patella. All therex performed in standing. Difficulty isolating right HS due to weakness. Decreased ROM within right knee joint and decreased flexibility of the right quad, hip flexor and hamstring. Added thomas stretch to HEP. Patient would benefit from continued  skilled therapy to address ROM and strength deficits in order to improve functional mobility and return to PLOF.    Personal Factors and Comorbidities Age;Fitness;Past/Current Experience;Comorbidity 3+;Time since onset of injury/illness/exacerbation    Comorbidities HLD, OA multiple joints, GERD, CKD    Examination-Activity Limitations Bathing;Carry;Lift;Stand;Bed Mobility;Locomotion Level;Bend;Squat;Hygiene/Grooming;Stairs    Examination-Participation Restrictions Cleaning;Community Activity;[facility]alla Leaver [facility]    Stability/Clinical Decision Making Evolving/Moderate complexity    Rehab Potential Good    PT Frequency 2x / week    PT Duration 8 weeks    PT Treatment/Interventions ADLs/Self Care Home Management;Aquatic Therapy;Cryotherapy;Electrical Stimulation;Biofeedback;Iontophoresis 4mg /ml Dexamethasone;Moist Heat;Traction;Ultrasound;Parrafin;Fluidtherapy;Contrast Bath;Gait training;Stair training;Functional mobility training;Therapeutic activities;Therapeutic exercise;Balance training;Neuromuscular re-education;Patient/family education;Manual techniques;Compression bandaging;Passive range of motion;Dry needling;Energy conservation;Taping;Spinal Manipulations;Joint Manipulations    PT Next Visit Plan increase knee ROM    PT Home Exercise Plan quad sets, sit to stands, prone knee extension, seated knee flexion    Consulted and Agree with Plan of Care Patient             Patient will benefit from skilled therapeutic intervention in order to improve the following deficits and impairments:  Abnormal gait, Decreased activity tolerance, Decreased endurance, Decreased range of motion, Decreased strength, Hypomobility, Increased fascial restricitons, Improper body mechanics, Pain, Decreased balance, Decreased coordination, Decreased mobility, Difficulty walking, Postural dysfunction  Visit Diagnosis: Acute pain of right knee  Muscle weakness (generalized)  Chronic  bilateral low back pain without  sciatica     Problem List Patient Active Problem List   Diagnosis Date Noted   Anemia 01/04/2022   S/P right unicompartmental knee replacement 12/10/2021   Bronchitis 10/31/2021   Other fatigue 10/22/2021   Exocrine pancreatic insufficiency 10/22/2021   Encounter for preoperative assessment 09/11/2021   Aortic atherosclerosis ([place]) 09/11/2021   Benign positional vertigo, right 09/13/2020   Mixed Alzheimer's and vascular dementia ([place]) 09/13/2020   Generalized muscle weakness 09/13/2020   Pulmonary nodule less than 1 cm in diameter with low risk for malignant neoplasm 07/26/2020   Depression 07/26/2020   B12 deficiency 07/26/2020   Abnormal posture 04/26/2020   Widowed 03/11/2020   Osteoarthritis of right knee 08/26/2019   Orthostatic dizziness 08/18/2019   S/P left unicompartmental knee replacement 01/26/2019   Hospital discharge follow-up 12/26/2018   Incidental pulmonary nodule, > 58mm and < 70mm 12/26/2018   Chronic kidney disease (CKD), stage III (moderate) 12/26/2018   Left bundle branch block (LBBB) determined by electrocardiography 12/02/2018   Encounter for pre-operative cardiovascular clearance 12/02/2018   GERD (gastroesophageal reflux disease) 09/29/2018   Anxiety disorder 03/25/2018   Diarrhea due to malabsorption 04/18/2017   Primary osteoarthritis of right knee 10/13/2016   Chronic pain of right knee 05/27/2016   Knee pain, bilateral 05/27/2016   Exertional dyspnea 04/15/2016   Left carotid bruit 04/14/2016   Menopausal hot flushes 08/07/2015   H/O malignant neoplasm of skin 06/25/2015   Hypothyroidism 01/31/2015   History of cervical cancer 01/10/2015   Hyperlipidemia 02/03/2014   Medicare annual wellness visit, subsequent 01/19/2014   History of IBS 01/19/2014   S/P breast augmentation 01/19/2014   Primary osteoarthritis of left knee 07/06/2012   Colon polyps 01/30/2012   Low back pain 01/30/2012    Patrina Levering PT, DPT   [place] [facility] [address], [place], [address] Phone: [phone]   Fax:  [phone]  Name: CORTNE AMARA MRN: [id number] Date of Birth: 1931-10-09

## 2022-01-22 ENCOUNTER — Encounter: Payer: Medicare Other | Admitting: Physical Therapy

## 2022-01-23 ENCOUNTER — Ambulatory Visit: Payer: Medicare HMO | Admitting: Physical Therapy

## 2022-01-24 DIAGNOSIS — M1711 Unilateral primary osteoarthritis, right knee: Secondary | ICD-10-CM | POA: Diagnosis not present

## 2022-01-25 ENCOUNTER — Other Ambulatory Visit: Payer: Self-pay | Admitting: Internal Medicine

## 2022-01-28 ENCOUNTER — Ambulatory Visit: Payer: Medicare HMO | Admitting: Physical Therapy

## 2022-01-28 NOTE — Patient Instructions (Incomplete)
**  RIGHT KNEE / LOW BACK** ? ?INTERVENTIONS ?  ?Ther-Ex ?Nustep L2-3, seat 6, 5 minutes for gentle LE/spine ROM and strengthening; ?Standing march, alternating with 3# AW 3x10 ?Standing hamstring curl, alternating with 3# AW 3x10 ?Standing heel raise, BUE support, 3 x15 ?Stairs (step-to pattern) x4 steps leading with RLEto ascend and LLE to descend, BUE support, 3 sets  ?Heel slide RLE, cuing for full available ROM, 2x10 ?Thomas stretch RLE 2 x 60 seconds; gravity only, no OP - strong stretch in quad and hip flexors. **Added to HEP** ?  ?Manual ?Patellar mobs med-lat and sup-inf, 2 minutes each ?Hamstring stretch, 60 seconds x2 reps RLE with OP at knee to encourage extension  ?Single knee to chest x60 seconds  ?Knee flexion PROM with OP hold at end range (97 degrees) x10 reps  ?  ?  ?Next Session: ?TA activation ?Quad stretch  ?Knee flexion ?Step over hurdles x5 using cane for SUE support, 5 sets. ?Supine prop stretch 2 min with with PT supporting distal extremity and providing light OP at knee.  ?STS 3x10  ?LAQ BLE with 3# AW  ?  ?  ?  ?Clinical Impression: Pt demonstrates good motivation this date although she does present with low energy. She reports significantly decreased sensitivity in the medial knee, reporting it "feels good" when PT mobs the patella. All therex performed in standing. Difficulty isolating right HS due to weakness. Decreased ROM within right knee joint and decreased flexibility of the right quad, hip flexor and hamstring. Added thomas stretch to HEP. Patient would benefit from continued skilled therapy to address ROM and strength deficits in order to improve functional mobility and return to PLOF.  ?

## 2022-01-31 ENCOUNTER — Ambulatory Visit: Payer: Medicare HMO | Admitting: Physical Therapy

## 2022-02-03 ENCOUNTER — Encounter: Payer: Medicare Other | Admitting: Physical Therapy

## 2022-02-05 ENCOUNTER — Encounter: Payer: Medicare Other | Admitting: Physical Therapy

## 2022-02-05 ENCOUNTER — Ambulatory Visit: Payer: Medicare HMO | Admitting: Physical Therapy

## 2022-02-05 DIAGNOSIS — M545 Low back pain, unspecified: Secondary | ICD-10-CM | POA: Diagnosis not present

## 2022-02-07 ENCOUNTER — Ambulatory Visit: Payer: Medicare HMO | Attending: Orthopedic Surgery

## 2022-02-07 ENCOUNTER — Other Ambulatory Visit: Payer: Self-pay

## 2022-02-07 DIAGNOSIS — G8929 Other chronic pain: Secondary | ICD-10-CM | POA: Insufficient documentation

## 2022-02-07 DIAGNOSIS — M545 Low back pain, unspecified: Secondary | ICD-10-CM | POA: Insufficient documentation

## 2022-02-07 DIAGNOSIS — M25561 Pain in right knee: Secondary | ICD-10-CM | POA: Diagnosis not present

## 2022-02-07 DIAGNOSIS — M6281 Muscle weakness (generalized): Secondary | ICD-10-CM | POA: Insufficient documentation

## 2022-02-07 NOTE — Therapy (Signed)
[facility] ?[address] ?[place], [place], [address] ?Phone: [phone]   Fax:  [phone] ? ?Physical Therapy Treatment ? ?Patient Details  ?Name: Emily Wu ?MRN: [id number] ?Date of Birth: 01/07/1931 ?Referring Provider (PT): Marchia Bond ? ? ?Encounter Date: 02/07/2022 ? ? PT End of Session - 02/07/22 1225   ? ? Visit Number 7   ? Number of Visits 17   ? Date for PT Re-Evaluation 02/18/22   ? Progress Note Due on Visit 10   ? PT Start Time 1132   ? PT Stop Time 8676   ? PT Time Calculation (min) 43 min   ? Activity Tolerance Patient tolerated treatment well   ? Behavior During Therapy [facility] for tasks assessed/performed   ? ?  ?  ? ?  ? ? ?Past Medical History:  ?Diagnosis Date  ? Arthritis   ? "knees, shoulders, chest" (02/03/2014)  ? Bruises easily   ? Cervical cancer ([place]) 1965  ? Concussion 03/11/2020  ? High cholesterol   ? History of kidney stones   ? Hypertension   ? DENIES   ? Tendency toward bleeding easily ([place])   ? ? ?Past Surgical History:  ?Procedure Laterality Date  ? ABDOMINAL HYSTERECTOMY  1965  ? APPENDECTOMY    ? CHOLECYSTECTOMY    ? KNEE ARTHROSCOPY Left   ? [facility]  ? PARTIAL KNEE ARTHROPLASTY Left 01/26/2019  ? Procedure: UNICOMPARTMENTAL KNEE;  Surgeon: Marchia Bond, MD;  Location: [facility];  Service: Orthopedics;  Laterality: Left;  ? PARTIAL KNEE ARTHROPLASTY Right 12/10/2021  ? Procedure: UNICOMPARTMENTAL KNEE;  Surgeon: Marchia Bond, MD;  Location: [facility];  Service: Orthopedics;  Laterality: Right;  ? SHOULDER OPEN ROTATOR CUFF REPAIR Bilateral 2007,  2010  ? [facility]  ? THROAT SURGERY  2014  ? "arthritis hugh knot" (02/03/2014)  ? ? ?There were no vitals filed for this visit. ? ? Subjective Assessment - 02/07/22 1224   ? ? Subjective Pt reports 3/10 R knee pain medially possibly due to weather over the past week.   ? Pertinent History 88 y.o. female pnt presents with right knee pain following a right unicompartmental knee  replacement on 12/10/21. [name] reports intermitent "dull" pain that does not travel and denys N/T. pnt reports currently 2/10 pain on NPS, best 0/10, and at worst 8/10. [name] has 2 low steps leading into her home that do not give her any issues. [name] aggrevating factors include getting into and out of a car (pnt has to swing her leg into car with her arms), getting into bed, standing for more than 2 minutes, and getting up from a chair. pnt takes NSAIDs for pain relief. Prior to surgury, pnt had caretaker 5x a week from 8am-2pm. Now patient requires additional help from the hours of 4pm-8pm. [name] was ambulating with a single point can prior to surgury and has now been alternating betweeen a rolator and the single point cane. Prior to surgury pnt required a chair to shower and get herself ready at the bathroom sink.  [name] reports getting max help from her caretakers to complete ADLs. [name] reports no falls within the last 6 months and goals include increaing right knee ROM. [name] denys any sudden weight change, B/B changes, n/v, fever, and night pain.   ? Limitations Lifting;Walking;House hold activities;Standing   ? How long can you sit comfortably? unlimted   ? How long can you stand comfortably? 2 minutes   ? How  long can you walk comfortably? limited   ? Patient Stated Goals increase knee ROM   ? Currently in Pain? Yes   ? Pain Score 3    ? Pain Location Knee   ? Pain Onset More than a month ago   ? ?  ?  ? ?  ? ? ? ? ?There-Ex ?Nustep L2-3, seat 6, 5 minutes for gentle LE/spine ROM and strengthening; ? ?Seated blue med ball roll outs for R knee AROM: 2x20  ?Seated add isometric with alternating Knee extension: 2x10, 3# AW's. VC's for eccentric control. ?Standing march, alternating with 3# AW 3x10. VC's for form/technique. ?Standing hamstring curl, alternating with 3# AW 3x10 ?Standing hip abduction with no resistance and BUE support: 2x15/LE ?Seated hip ER: 2x20 YTB, 1x20 RTB ? ?Pt requiring frequent seated rest b/t  exercises due to muscular fatigue in LE's. ? ? ? ? ? ? PT Education - 02/07/22 1225   ? ? Education Details form/technique with exercise   ? Person(s) Educated Patient   ? Methods Explanation;Demonstration   ? Comprehension Verbalized understanding;Returned demonstration   ? ?  ?  ? ?  ? ? ? PT Short Term Goals - 12/24/21 1615   ? ?  ? PT SHORT TERM GOAL #1  ? Title pnt will decrease max NPS number from 8/10 to 6/10 to show clinically significant decrease in pain.   ? Baseline 12/24/21 8/10   ? Time 4   ? Period Weeks   ? Status New   ? Target Date 01/21/22   ?  ? PT SHORT TERM GOAL #2  ? Title pnt will increase right knee PROM to WNL in order to demonstrate joint integrity needed for normalized gait and transfers   ? Baseline 12/24/21 PROM 94 - 20   ? Time 4   ? Period Weeks   ? Status New   ? Target Date 01/21/22   ?  ? PT SHORT TERM GOAL #3  ? Title Patient will demonstrate independence with HEP for strengthening and balance to decrease fall risk   ? Baseline 12/25/21 HEP given   ? Time 4   ? Period Weeks   ? Status New   ? Target Date 01/21/22   ? ?  ?  ? ?  ? ? ? ? PT Long Term Goals - 12/24/21 1622   ? ?  ? PT LONG TERM GOAL #1  ? Title Patient will increase FOTO score to 57 to demonstrate predicted increase in functional mobility to complete ADLs   ? Baseline 12/24/21 29   ? Time 8   ? Period Weeks   ? Status New   ? Target Date 02/18/22   ?  ? PT LONG TERM GOAL #2  ? Title [name] will decrease 5x sit to stand to 14.8 seconds to meet clinically significant norms to decrease community fall risk.   ? Baseline 12/24/21 unable   ? Time 8   ? Period Weeks   ? Status New   ? Target Date 02/18/22   ?  ? PT LONG TERM GOAL #3  ? Title [name] will imrove 10 meter walk test with single point cane to 0.8 m/s to show limited household ambulation speed.   ? Baseline 12/24/21 .34 m/s   ? Time 8   ? Period Weeks   ? Status New   ? Target Date 02/18/22   ?  ? PT LONG TERM GOAL #4  ? Title [name] will increase gross  R knee, and bilat hip abd  MMT to 4/5 to increase strength required for functional ADLs   ? Baseline 12/24/21 right flexion and extension: 3-/5; bilat hip abd 3/5   ? Time 8   ? Period Weeks   ? Status New   ?  ? PT LONG TERM GOAL #5  ? Title Patient will demonstrate R knee AROM of at least 110-10d to demonstrate symmetrical mobility needed for modI with gait and basic transfers   ? Baseline 12/24/21 91-25d   ? Time 8   ? Period Weeks   ? Status New   ? ?  ?  ? ?  ? ? ? ? ? ? ? ? Plan - 02/07/22 1226   ? ? Clinical Impression Statement Continuing PT POC with focus on R knee mobility with hip and knee strengthening. Pt required min to mod VC's for eccentric control but overall remains motivated has good understanding of exercise. Post session pt endorses R knee "feels losser" with reduction in pain. Pt will continue to benefit from Pt services to progress knee AROM and strength deficits to improve functional mobility.   ? Personal Factors and Comorbidities Age;Fitness;Past/Current Experience;Comorbidity 3+;Time since onset of injury/illness/exacerbation   ? Comorbidities HLD, OA multiple joints, GERD, CKD   ? Examination-Activity Limitations Bathing;Carry;Lift;Stand;Bed Mobility;Locomotion Level;Bend;Squat;Hygiene/Grooming;Stairs   ? Examination-Participation Restrictions Cleaning;Community Activity;Yard Work;Laundry   ? Stability/Clinical Decision Making Evolving/Moderate complexity   ? Rehab Potential Good   ? PT Frequency 2x / week   ? PT Duration 8 weeks   ? PT Treatment/Interventions ADLs/Self Care Home Management;Aquatic Therapy;Cryotherapy;Electrical Stimulation;Biofeedback;Iontophoresis '4mg'$ /ml Dexamethasone;Moist Heat;Traction;Ultrasound;Parrafin;Fluidtherapy;Contrast Bath;Gait training;Stair training;Functional mobility training;Therapeutic activities;Therapeutic exercise;Balance training;Neuromuscular re-education;Patient/family education;Manual techniques;Compression bandaging;Passive range of motion;Dry needling;Energy  conservation;Taping;Spinal Manipulations;Joint Manipulations   ? PT Next Visit Plan increase knee ROM   ? PT Home Exercise Plan quad sets, sit to stands, prone knee extension, seated knee flexion   ? Consulted and Agree with Pl

## 2022-02-10 ENCOUNTER — Encounter: Payer: Medicare Other | Admitting: Physical Therapy

## 2022-02-12 ENCOUNTER — Ambulatory Visit: Payer: Medicare HMO | Admitting: Physical Therapy

## 2022-02-12 ENCOUNTER — Encounter: Payer: Medicare Other | Admitting: Physical Therapy

## 2022-02-14 ENCOUNTER — Other Ambulatory Visit: Payer: Self-pay

## 2022-02-14 ENCOUNTER — Ambulatory Visit: Payer: Medicare HMO

## 2022-02-14 DIAGNOSIS — M545 Low back pain, unspecified: Secondary | ICD-10-CM | POA: Diagnosis not present

## 2022-02-14 DIAGNOSIS — G8929 Other chronic pain: Secondary | ICD-10-CM | POA: Diagnosis not present

## 2022-02-14 DIAGNOSIS — M6281 Muscle weakness (generalized): Secondary | ICD-10-CM | POA: Diagnosis not present

## 2022-02-14 DIAGNOSIS — M25561 Pain in right knee: Secondary | ICD-10-CM | POA: Diagnosis not present

## 2022-02-14 NOTE — Therapy (Signed)
[facility] ?[address] ?[place], [place], [address] ?Phone: [phone]   Fax:  [phone] ? ?Physical Therapy Treatment/Reassesment  ? ?Patient Details  ?Name: Emily Wu ?MRN: [id number] ?Date of Birth: 11-20-1931 ?Referring Provider (PT): Dr. Mardelle Matte ? ? ?Encounter Date: 02/14/2022 ? ? PT End of Session - 02/14/22 1548   ? ? Visit Number 8   ? Number of Visits 17   ? Date for PT Re-Evaluation 03/18/22   ? Authorization Type [employer]   ? Authorization Time Period 02/14/22-03/18/22   ? Progress Note Due on Visit 10   ? PT Start Time 1137   ? PT Stop Time 3016   ? PT Time Calculation (min) 38 min   ? Activity Tolerance Patient tolerated treatment well   ? Behavior During Therapy [facility] for tasks assessed/performed   ? ?  ?  ? ?  ? ? ?Past Medical History:  ?Diagnosis Date  ? Arthritis   ? "knees, shoulders, chest" (02/03/2014)  ? Bruises easily   ? Cervical cancer ([place]) 1965  ? Concussion 03/11/2020  ? High cholesterol   ? History of kidney stones   ? Hypertension   ? DENIES   ? Tendency toward bleeding easily ([place])   ? ? ?Past Surgical History:  ?Procedure Laterality Date  ? ABDOMINAL HYSTERECTOMY  1965  ? APPENDECTOMY    ? CHOLECYSTECTOMY    ? KNEE ARTHROSCOPY Left   ? [facility]  ? PARTIAL KNEE ARTHROPLASTY Left 01/26/2019  ? Procedure: UNICOMPARTMENTAL KNEE;  Surgeon: Marchia Bond, MD;  Location: [facility];  Service: Orthopedics;  Laterality: Left;  ? PARTIAL KNEE ARTHROPLASTY Right 12/10/2021  ? Procedure: UNICOMPARTMENTAL KNEE;  Surgeon: Marchia Bond, MD;  [facility];  Service: Orthopedics;  Laterality: Right;  ? SHOULDER OPEN ROTATOR CUFF REPAIR Bilateral 2007,  2010  ? [facility]  ? THROAT SURGERY  2014  ? "arthritis hugh knot" (02/03/2014)  ? ? ?There were no vitals filed for this visit. ? ? Subjective Assessment - 02/14/22 1536   ? ? Subjective Pt reports she continues to have transportation difficulty due to staffing issues with her home aide  agency, today she arrives with a stand-in aide. Pt reports she has been doing very well, She does not endorse any 'pain' but says she has some medial knee soreness at times. She has not been working on any speciifc exercises given to her in home. She conitnues AMB ad lib in home without difficulty, but continues to use [facility] when she remembers to use it, whereas baseline she used no AD for AMB. Pt reports she is having more back pain and would like to begin PT for her back once her knee PT is completed.   ? Pertinent History 88 y.o. female pnt presents with right knee pain following a right unicompartmental knee replacement on 12/10/21. [name] reports intermittent "dull" pain that does not travel and denies N/T. pnt reports currently 2/10 pain on NPS, best 0/10, and at worst 8/10. [name] has 2 low steps leading into her home that do not give her any issues. [name] aggravating factors include getting into and out of a car (pnt has to swing her leg into car with her arms), getting into bed, standing for more than 2 minutes, and getting up from a chair. pnt takes NSAIDs for pain relief. Prior to surgery, pnt had caretaker 5x a week from 8am-2pm. Now patient requires additional help from the hours of 4pm-8pm. [name]  was ambulating with a single point can prior to surgery and has now been alternating between a rollator and the single point cane. Prior to surgery pnt required a chair to shower and get herself ready at the bathroom sink.  [name] reports getting max help from her caretakers to complete ADLs. [name] reports no falls within the last 6 months and goals include increasing right knee ROM.   ? How long can you sit comfortably? unlimited   ? How long can you stand comfortably? 2 minutes   ? How long can you walk comfortably? limited   ? Patient Stated Goals increase knee ROM, return to AMB without device, be able to AMB limited community distances.   ? Currently in Pain? No/denies   ? ?  ?  ? ?  ? ? ? ? ? [facility] PT Assessment - 02/14/22  0001   ? ?  ? Assessment  ? Medical Diagnosis Rt knee unicompartmental arthroplasty   ? Referring Provider (PT) Dr. Mardelle Matte   ? Onset Date/Surgical Date 12/10/21   ? Hand Dominance Right   ? Prior Therapy acute   ?  ? Restrictions  ? Weight Bearing Restrictions No   ?  ? Balance Screen  ? Has the patient fallen in the past 6 months No   ? Has the patient had a decrease in activity level because of a fear of falling?  No   ? Is the patient reluctant to leave their home because of a fear of falling?  No   ?  ? Prior Function  ? Level of Independence Independent with household mobility with device;Independent with basic ADLs;Needs assistance with homemaking   ?  ? Observation/Other Assessments  ? Focus on Therapeutic Outcomes (FOTO)  47   29 at evaluation  ?  ? ROM / Strength  ? AROM / PROM / Strength AROM   ?  ? AROM  ? AROM Assessment Site Knee   ? Right/Left Knee Right;Left   ? Right Knee Extension 20    ? Right Knee Flexion 108    ? Left Knee Extension 9   ? Left Knee Flexion 120   ?  ? Transfers  ? Transfers Sit to Stand   ? Sit to Stand 5: Supervision;With upper extremity assist;With armrests;From chair/3-in-1;Multiple attempts   unable to rise hands-free despite attempts  ? Five time sit to stand comments  16.5sec    bilat push off arm rests (60sec c same technique at evlauation)  ?  ? Ambulation/Gait  ? Ambulation Distance (Feet) 200 Feet    asks to stop due to DOE  ? Assistive device 4-wheeled walker   ? Gait Pattern Lateral hip instability;Trendelenburg;Lateral trunk lean to right;Trunk flexed   ? Gait velocity 0.79ms   ? ?  ?  ? ?  ? ?Began session with 4 minutes AA/ROM on Nustep ? ? ? PT Education - 02/14/22 1547   ? ? Education Details goals moving forward to increase knee range and AMB tolerance   ? Person(s) Educated Patient   ? Methods Explanation;Demonstration   ? Comprehension Verbalized understanding   ? ?  ?  ? ?  ? ? ? PT Short Term Goals - 02/14/22 1559   ? ?  ? PT SHORT TERM GOAL #1  ? Title pnt  will decrease max NPS number from 8/10 to 6/10 to show clinically significant decrease in pain.   ? Baseline 12/24/21 8/10   ? Time 4   ? Period  Weeks   ? Status Achieved   ? Target Date 01/21/22   ?  ? PT SHORT TERM GOAL #2  ? Title pnt will increase right knee PROM to WNL in order to demonstrate joint integrity needed for normalized gait and transfers   ? Baseline 12/24/21 PROM 94 - 20; 02/14/22: 108-20   ? Time 4   ? Period Weeks   ? Status On-going   ? Target Date 01/21/22   ?  ? PT SHORT TERM GOAL #3  ? Title Patient will demonstrate independence with HEP for strengthening and balance to decrease fall risk   ? Baseline 12/25/21 HEP given; 3/24: not being performed   ? Time 4   ? Period Weeks   ? Status On-going   ? Target Date 01/21/22   ? ?  ?  ? ?  ? ? ? ? PT Long Term Goals - 02/14/22 1601   ? ?  ? PT LONG TERM GOAL #1  ? Title Patient will increase FOTO score to 57 to demonstrate predicted increase in functional mobility to complete ADLs   ? Baseline 12/24/21 29; 02/14/22: 47   ? Time 8   ? Period Weeks   ? Status On-going   ? Target Date 03/18/22   ?  ? PT LONG TERM GOAL #2  ? Title [name] will decrease 5x sit to stand to 14.8 seconds to meet clinically significant norms to decrease community fall risk.   ? Baseline 12/24/21 unable, 60sec c hands; 02/14/22: 16sec c hands   ? Time 8   ? Period Weeks   ? Status On-going   ? Target Date 03/18/22   ?  ? PT LONG TERM GOAL #3  ? Title [name] will imrove 10 meter walk test with single point cane to 0.8 m/s to show limited household ambulation speed.   ? Baseline 12/24/21 .34 m/s; 3/24: on 246f at 0.759m c 4WW   ? Time 8   ? Period Weeks   ? Status On-going   ? Target Date 03/18/22   ?  ? PT LONG TERM GOAL #4  ? Title [name] will increase gross R knee, and bilat hip abd MMT to 4/5 to increase strength required for functional ADLs   ? Baseline 12/24/21 right flexion and extension: 3-/5; bilat hip abd 3/5   ? Time 8   ? Target Date 03/18/22   ?  ? PT LONG TERM GOAL #5  ? Title  Patient will demonstrate R knee AROM of at least 110-10d to demonstrate symmetrical mobility needed for modI with gait and basic transfers   ? Baseline 12/24/21 91-25d; 02/14/22: 108-20   ? Time 8   ? Period W

## 2022-02-17 ENCOUNTER — Encounter: Payer: Medicare Other | Admitting: Physical Therapy

## 2022-02-19 ENCOUNTER — Other Ambulatory Visit: Payer: Self-pay

## 2022-02-19 ENCOUNTER — Ambulatory Visit: Payer: Medicare HMO

## 2022-02-19 ENCOUNTER — Encounter: Payer: Medicare Other | Admitting: Physical Therapy

## 2022-02-19 DIAGNOSIS — M6281 Muscle weakness (generalized): Secondary | ICD-10-CM | POA: Diagnosis not present

## 2022-02-19 DIAGNOSIS — G8929 Other chronic pain: Secondary | ICD-10-CM

## 2022-02-19 DIAGNOSIS — M25561 Pain in right knee: Secondary | ICD-10-CM

## 2022-02-19 DIAGNOSIS — M545 Low back pain, unspecified: Secondary | ICD-10-CM | POA: Diagnosis not present

## 2022-02-19 NOTE — Therapy (Signed)
[facility] ?[address] ?[place], [place], [address] ?Phone: [phone]   Fax:  [phone] ? ?Physical Therapy Treatment ? ?Patient Details  ?Name: Emily Wu ?MRN: [id number] ?Date of Birth: October 20, 1931 ?Referring Provider (PT): Dr. Mardelle Matte ? ? ?Encounter Date: 02/19/2022 ? ? PT End of Session - 02/19/22 1138   ? ? Visit Number 9   ? Number of Visits 17   ? Date for PT Re-Evaluation 03/18/22   ? Authorization Type [employer]   ? Authorization Time Period 02/14/22-03/18/22   ? Progress Note Due on Visit 10   ? PT Start Time 1134   ? PT Stop Time 1212   ? PT Time Calculation (min) 38 min   ? Equipment Utilized During Treatment Gait belt   ? Activity Tolerance Patient tolerated treatment well;Patient limited by pain   ? Behavior During Therapy [facility] for tasks assessed/performed   ? ?  ?  ? ?  ? ? ?Past Medical History:  ?Diagnosis Date  ? Arthritis   ? "knees, shoulders, chest" (02/03/2014)  ? Bruises easily   ? Cervical cancer ([place]) 1965  ? Concussion 03/11/2020  ? High cholesterol   ? History of kidney stones   ? Hypertension   ? DENIES   ? Tendency toward bleeding easily ([place])   ? ? ?Past Surgical History:  ?Procedure Laterality Date  ? ABDOMINAL HYSTERECTOMY  1965  ? APPENDECTOMY    ? CHOLECYSTECTOMY    ? KNEE ARTHROSCOPY Left   ? [facility]  ? PARTIAL KNEE ARTHROPLASTY Left 01/26/2019  ? Procedure: UNICOMPARTMENTAL KNEE;  Surgeon: Marchia Bond, MD;  Location: [facility];  Service: Orthopedics;  Laterality: Left;  ? PARTIAL KNEE ARTHROPLASTY Right 12/10/2021  ? Procedure: UNICOMPARTMENTAL KNEE;  Surgeon: Marchia Bond, MD;  Location: [facility];  Service: Orthopedics;  Laterality: Right;  ? SHOULDER OPEN ROTATOR CUFF REPAIR Bilateral 2007,  2010  ? [facility]  ? THROAT SURGERY  2014  ? "arthritis hugh knot" (02/03/2014)  ? ? ?There were no vitals filed for this visit. ? ? Subjective Assessment - 02/19/22 1137   ? ? Subjective Pt doing well today. She conitnued to have  pain in medial knee when she is walking much. Pt no longer has any of her HEP handouts.   ? Pertinent History 88 y.o. female pnt presents with right knee pain following a right unicompartmental knee replacement on 12/10/21. [name] reports intermittent "dull" pain that does not travel and denies N/T. pnt reports currently 2/10 pain on NPS, best 0/10, and at worst 8/10. [name] has 2 low steps leading into her home that do not give her any issues. [name] aggravating factors include getting into and out of a car (pnt has to swing her leg into car with her arms), getting into bed, standing for more than 2 minutes, and getting up from a chair. pnt takes NSAIDs for pain relief. Prior to surgery, pnt had caretaker 5x a week from 8am-2pm. Now patient requires additional help from the hours of 4pm-8pm. [name] was ambulating with a single point can prior to surgery and has now been alternating between a rollator and the single point cane. Prior to surgery pnt required a chair to shower and get herself ready at the bathroom sink.  [name] reports getting max help from her caretakers to complete ADLs. [name] reports no falls within the last 6 months and goals include increasing right knee ROM.   ? How long can you walk  comfortably? 200   ? Currently in Pain? No/denies   ? ?  ?  ? ?  ? ?-Nustep x4 minutes seat 6, arms 8, Level 2  ?-Rt knee flexion P/ROM Stretch 4x30sec (assisted ROM between stretches) ?-Rt knee extension P/ROM Stretch 4x30sec (assisted ROM between stretches) ?-Rt knee SAQ on Grey 3x10 @ 2lb, 1x10 at 4lb, 1x15 at 4lb ?-Rt straight leg hip extension from 30 to 0, manually resisted 2x15  ?-Rt manually resisted leg press 1x10  ?-Hook lying RLE marching 1x15 ?-Rt manually resisted leg press 1x10  ?-Hook lying RLE marching 1x15 ?-standing back to wall working on upright posture 2x30sec ?-upright posture AMB with 4WW 173f  ?-STS from elevated surface x8, hands free ?-upright posture AMB with 4WW 1263f ? ?Access Code: [email or web address] ?URL:  [email or web address] ?Date: 02/19/2022 ?Prepared by: AlRebbeca Paul ?Exercises ?- Hip and Knee Extension and Flexion Caregiver PROM  - 1 x daily - 7 x weekly - 1 sets - 5 reps - 30 hold ?- Standing with Back Flat Against Wall  - 1 x daily - 7 x weekly - 1 sets - 3 reps - 30 hold ? ? PT Education - 02/19/22 1139   ? ? Education Details HEP [place]   ? Person(s) Educated Patient   ? Methods Explanation   ? Comprehension Verbalized understanding;Returned demonstration   ? ?  ?  ? ?  ? ? ? PT Short Term Goals - 02/14/22 1559   ? ?  ? PT SHORT TERM GOAL #1  ? Title pnt will decrease max NPS number from 8/10 to 6/10 to show clinically significant decrease in pain.   ? Baseline 12/24/21 8/10   ? Time 4   ? Period Weeks   ? Status Achieved   ? Target Date 01/21/22   ?  ? PT SHORT TERM GOAL #2  ? Title pnt will increase right knee PROM to WNL in order to demonstrate joint integrity needed for normalized gait and transfers   ? Baseline 12/24/21 PROM 94 - 20; 02/14/22: 108-20   ? Time 4   ? Period Weeks   ? Status On-going   ? Target Date 01/21/22   ?  ? PT SHORT TERM GOAL #3  ? Title Patient will demonstrate independence with HEP for strengthening and balance to decrease fall risk   ? Baseline 12/25/21 HEP given; 3/24: not being performed   ? Time 4   ? Period Weeks   ? Status On-going   ? Target Date 01/21/22   ? ?  ?  ? ?  ? ? ? ? PT Long Term Goals - 02/14/22 1601   ? ?  ? PT LONG TERM GOAL #1  ? Title Patient will increase FOTO score to 57 to demonstrate predicted increase in functional mobility to complete ADLs   ? Baseline 12/24/21 29; 02/14/22: 47   ? Time 8   ? Period Weeks   ? Status On-going   ? Target Date 03/18/22   ?  ? PT LONG TERM GOAL #2  ? Title [name] will decrease 5x sit to stand to 14.8 seconds to meet clinically significant norms to decrease community fall risk.   ? Baseline 12/24/21 unable, 60sec c hands; 02/14/22: 16sec c hands   ? Time 8   ? Period Weeks   ? Status On-going   ? Target Date  03/18/22   ?  ? PT LONG TERM GOAL #3  ? Title [name] will imrove 10 meter  walk test with single point cane to 0.8 m/s to show limited household ambulation speed.   ? Baseline 12/24/21 .34 m/s; 3/24: on 292f at 0.7104m c 4WW   ? Time 8   ? Period Weeks   ? Status On-going   ? Target Date 03/18/22   ?  ? PT LONG TERM GOAL #4  ? Title [name] will increase gross R knee, and bilat hip abd MMT to 4/5 to increase strength required for functional ADLs   ? Baseline 12/24/21 right flexion and extension: 3-/5; bilat hip abd 3/5   ? Time 8   ? Target Date 03/18/22   ?  ? PT LONG TERM GOAL #5  ? Title Patient will demonstrate R knee AROM of at least 110-10d to demonstrate symmetrical mobility needed for modI with gait and basic transfers   ? Baseline 12/24/21 91-25d; 02/14/22: 108-20   ? Time 8   ? Period Weeks   ? Status On-going   ? Target Date 03/18/22   ? ?  ?  ? ?  ? ? ? ? ? ? ? ? Plan - 02/19/22 1141   ? ? Clinical Impression Statement Reassessment showing ROM and AMB restrictions last time. This date bringing focus back to the basics, Knee stretching maintained to pt tolerance. Pt has fairly good quads activation despite extension deficit. Walking continued to be more limited by acute Rt low back issue that precludes full upright, but this is well managed with 4WW.   ? Personal Factors and Comorbidities Age;Fitness;Past/Current Experience;Comorbidity 3+;Time since onset of injury/illness/exacerbation   ? Comorbidities HLD, OA multiple joints, GERD, CKD   ? Examination-Activity Limitations Bathing;Carry;Lift;Stand;Bed Mobility;Locomotion Level;Bend;Squat;Hygiene/Grooming;Stairs   ? Examination-Participation Restrictions Cleaning;Community Activity;Yard Work;Laundry   ? Stability/Clinical Decision Making Stable/Uncomplicated   ? Clinical Decision Making Moderate   ? Rehab Potential Fair   ? PT Frequency 2x / week   ? PT Duration 4 weeks   ? PT Treatment/Interventions ADLs/Self Care Home Management;Aquatic  Therapy;Cryotherapy;Electrical Stimulation;Biofeedback;Iontophoresis '4mg'$ /ml Dexamethasone;Moist Heat;Traction;Ultrasound;Parrafin;Fluidtherapy;Contrast Bath;Gait training;Stair training;Functional mobility training;Therapeutic activities;

## 2022-02-21 ENCOUNTER — Other Ambulatory Visit: Payer: Self-pay

## 2022-02-21 ENCOUNTER — Ambulatory Visit: Payer: Medicare HMO

## 2022-02-21 DIAGNOSIS — M25561 Pain in right knee: Secondary | ICD-10-CM

## 2022-02-21 DIAGNOSIS — M6281 Muscle weakness (generalized): Secondary | ICD-10-CM

## 2022-02-21 DIAGNOSIS — M545 Low back pain, unspecified: Secondary | ICD-10-CM

## 2022-02-21 DIAGNOSIS — G8929 Other chronic pain: Secondary | ICD-10-CM | POA: Diagnosis not present

## 2022-02-21 NOTE — Therapy (Signed)
[facility] ?[address] ?[place], [place], [address] ?Phone: [phone]   Fax:  [phone] ? ?Physical Therapy Treatment ?Physical Therapy Progress Note ? ? ?Dates of reporting period  12/24/21   to   02/21/22 ? ? ?Patient Details  ?Name: Emily Wu ?MRN: [id number] ?Date of Birth: 1931-09-04 ?Referring Provider (PT): Dr. Mardelle Matte ? ? ?Encounter Date: 02/21/2022 ? ? PT End of Session - 02/21/22 1136   ? ? Visit Number 10   ? Number of Visits 17   ? Date for PT Re-Evaluation 03/18/22   ? Authorization Type [employer]   ? Authorization Time Period 02/14/22-03/18/22   ? Progress Note Due on Visit 20   ? PT Start Time 2683   ? PT Stop Time 1211   ? PT Time Calculation (min) 40 min   ? Equipment Utilized During Treatment Gait belt   ? Activity Tolerance Patient tolerated treatment well;Patient limited by pain   ? Behavior During Therapy [facility] for tasks assessed/performed   ? ?  ?  ? ?  ? ? ?Past Medical History:  ?Diagnosis Date  ? Arthritis   ? "knees, shoulders, chest" (02/03/2014)  ? Bruises easily   ? Cervical cancer ([place]) 1965  ? Concussion 03/11/2020  ? High cholesterol   ? History of kidney stones   ? Hypertension   ? DENIES   ? Tendency toward bleeding easily ([place])   ? ? ?Past Surgical History:  ?Procedure Laterality Date  ? ABDOMINAL HYSTERECTOMY  1965  ? APPENDECTOMY    ? CHOLECYSTECTOMY    ? KNEE ARTHROSCOPY Left   ? [facility]  ? PARTIAL KNEE ARTHROPLASTY Left 01/26/2019  ? Procedure: UNICOMPARTMENTAL KNEE;  Surgeon: Marchia Bond, MD;  Location: [facility];  Service: Orthopedics;  Laterality: Left;  ? PARTIAL KNEE ARTHROPLASTY Right 12/10/2021  ? Procedure: UNICOMPARTMENTAL KNEE;  Surgeon: Marchia Bond, MD;  [facility];  Service: Orthopedics;  Laterality: Right;  ? SHOULDER OPEN ROTATOR CUFF REPAIR Bilateral 2007,  2010  ? [facility]  ? THROAT SURGERY  2014  ? "arthritis hugh knot" (02/03/2014)  ? ? ?There were no vitals filed for this visit. ? ?  Subjective Assessment - 02/21/22 1134   ? ? Subjective Pt doing good today, reports soem increased soreness after last session with mor estretching involved. She was also on her feet more yesterday attending  afuneral. She tried new HEP stretches with aide with success.   ? Pertinent History 88 y.o. female pnt presents with right knee pain following a right unicompartmental knee replacement on 12/10/21. [name] reports intermittent "dull" pain that does not travel and denies N/T. pnt reports currently 2/10 pain on NPS, best 0/10, and at worst 8/10. [name] has 2 low steps leading into her home that do not give her any issues. [name] aggravating factors include getting into and out of a car (pnt has to swing her leg into car with her arms), getting into bed, standing for more than 2 minutes, and getting up from a chair. pnt takes NSAIDs for pain relief. Prior to surgery, pnt had caretaker 5x a week from 8am-2pm. Now patient requires additional help from the hours of 4pm-8pm. [name] was ambulating with a single point can prior to surgery and has now been alternating between a rollator and the single point cane. Prior to surgery pnt required a chair to shower and get herself ready at the bathroom sink.  [name] reports getting max help from her  caretakers to complete ADLs. [name] reports no falls within the last 6 months and goals include increasing right knee ROM.   ? Currently in Pain? Yes   ? Pain Score 3    3/10 Rt knee, medial  ? ?  ?  ? ?  ? ? ? ?INTERVENTION THIS DATE:  ?-seated flexion heel slides 1x90sec on slider  ?-seated RLE rotational heel slides 1x60sec on slider  ? ?-Rt knee flexion stretch seated 3x30sec  ?-RLE calf raise seated, DF start on hedgehog 2x15 ?-RLE ankle DF 2x16 @ 2lb ? ?-Supine RLE knee extension stretch 1x90sec c concurrent ice pack on knee ?-AA/ROM knee flexion extension x10  ?-Supine RLE knee extension stretch 1x90sec c concurrent ice pack on knee ?-AA/ROM knee flexion extension x10  ?-Supine RLE knee  extension stretch 1x90sec c concurrent ice pack on knee ?-AA/ROM knee flexion extension x10  ? ?-Manually resisted Rt Leg press 1x10 (deep range)  ?-Hooklying bridge 1x10 (significant weakness, unable to achieve lifftoff, but does demo good hamstrings activation)  ?-Manually resisted Rt Leg press 1x10 (deep range)  ?-Hooklying bridge 1x10 (significant weakness, unable to achieve lifftoff, but does demo good hamstrings activation)  ?-Manually resisted Rt Leg press 1x10 (deep range) (gets stronger each time! Could advance to leg press if back tolerates without aggravation) ? ?-seated LAQ 1x10 @ 4lb (HS limitations)  ?-seated LAQ 1x20 @ 4lb (very limited by hamstrings compared to SAQ) ?-seated knee flexion 1x10 @ red TB  ?-seated knee flexion 1x12 @ yellow TB  ? ?-Rt SAQ 1x10x2secH  ?-Rt SAQ 1x10x2secH  ? ?Access Code: [email or web address] ?URL: [email or web address] ?Date: 02/19/2022 ?Prepared by: Rebbeca Paul ?  ?Exercises ?- Hip and Knee Extension and Flexion Caregiver PROM  - 1 x daily - 7 x weekly - 1 sets - 5 reps - 30 hold ?- Standing with Back Flat Against Wall  - 1 x daily - 7 x weekly - 1 sets - 3 reps - 30 hold ? ? ? PT Education - 02/21/22 1137   ? ? Education Details discussed adjusting HEP based on symptoms   ? Person(s) Educated Patient   ? Methods Explanation   ? Comprehension Verbalized understanding;Returned demonstration   ? ?  ?  ? ?  ? ? ? PT Short Term Goals - 02/14/22 1559   ? ?  ? PT SHORT TERM GOAL #1  ? Title pnt will decrease max NPS number from 8/10 to 6/10 to show clinically significant decrease in pain.   ? Baseline 12/24/21 8/10   ? Time 4   ? Period Weeks   ? Status Achieved   ? Target Date 01/21/22   ?  ? PT SHORT TERM GOAL #2  ? Title pnt will increase right knee PROM to WNL in order to demonstrate joint integrity needed for normalized gait and transfers   ? Baseline 12/24/21 PROM 94 - 20; 02/14/22: 108-20   ? Time 4   ? Period Weeks   ? Status On-going   ? Target Date 01/21/22   ?  ?  PT SHORT TERM GOAL #3  ? Title Patient will demonstrate independence with HEP for strengthening and balance to decrease fall risk   ? Baseline 12/25/21 HEP given; 3/24: not being performed   ? Time 4   ? Period Weeks   ? Status On-going   ? Target Date 01/21/22   ? ?  ?  ? ?  ? ? ? ? PT Long Term Goals - 02/14/22 1601   ? ?  ?  PT LONG TERM GOAL #1  ? Title Patient will increase FOTO score to 57 to demonstrate predicted increase in functional mobility to complete ADLs   ? Baseline 12/24/21 29; 02/14/22: 47   ? Time 8   ? Period Weeks   ? Status On-going   ? Target Date 03/18/22   ?  ? PT LONG TERM GOAL #2  ? Title [name] will decrease 5x sit to stand to 14.8 seconds to meet clinically significant norms to decrease community fall risk.   ? Baseline 12/24/21 unable, 60sec c hands; 02/14/22: 16sec c hands   ? Time 8   ? Period Weeks   ? Status On-going   ? Target Date 03/18/22   ?  ? PT LONG TERM GOAL #3  ? Title [name] will imrove 10 meter walk test with single point cane to 0.8 m/s to show limited household ambulation speed.   ? Baseline 12/24/21 .34 m/s; 3/24: on 243f at 0.78m c 4WW   ? Time 8   ? Period Weeks   ? Status On-going   ? Target Date 03/18/22   ?  ? PT LONG TERM GOAL #4  ? Title [name] will increase gross R knee, and bilat hip abd MMT to 4/5 to increase strength required for functional ADLs   ? Baseline 12/24/21 right flexion and extension: 3-/5; bilat hip abd 3/5   ? Time 8   ? Target Date 03/18/22   ?  ? PT LONG TERM GOAL #5  ? Title Patient will demonstrate R knee AROM of at least 110-10d to demonstrate symmetrical mobility needed for modI with gait and basic transfers   ? Baseline 12/24/21 91-25d; 02/14/22: 108-20   ? Time 8   ? Period Weeks   ? Status On-going   ? Target Date 03/18/22   ? ?  ?  ? ?  ? ? ? ? ? ? ? ? Plan - 02/21/22 1138   ? ? Clinical Impression Statement Continued with gentle ROM LLLD and basic activation. Pt tolerates session well in general, used ICE as needed to improve tolerance to stretches. Pt  has hamstrings limitations wth LAQ, but good activation in SAQ now at 5lb. Pt confirms ice helps make extension stretches less painful. Pt encouraged to conitnue with daily stretching over the weekend.   ? Person

## 2022-02-24 ENCOUNTER — Ambulatory Visit: Payer: Medicare HMO | Attending: Orthopedic Surgery

## 2022-02-24 DIAGNOSIS — M545 Low back pain, unspecified: Secondary | ICD-10-CM | POA: Diagnosis not present

## 2022-02-24 DIAGNOSIS — G8929 Other chronic pain: Secondary | ICD-10-CM | POA: Insufficient documentation

## 2022-02-24 DIAGNOSIS — M6281 Muscle weakness (generalized): Secondary | ICD-10-CM | POA: Insufficient documentation

## 2022-02-24 DIAGNOSIS — M25561 Pain in right knee: Secondary | ICD-10-CM | POA: Insufficient documentation

## 2022-02-24 NOTE — Therapy (Signed)
[facility] ?[address] ?[place], [place], [address] ?Phone: [phone]   Fax:  [phone] ? ?Physical Therapy Treatment ? ?Patient Details  ?Name: Emily Wu ?MRN: [id number] ?Date of Birth: 1931-11-15 ?Referring Provider (PT): Dr. Mardelle Matte ? ? ?Encounter Date: 02/24/2022 ? ? PT End of Session - 02/24/22 1011   ? ? Visit Number 11   ? Number of Visits 17   ? Date for PT Re-Evaluation 03/18/22   ? Authorization Type [employer]   ? Authorization Time Period cert 04/20/40-02/15/39; Authorization 12/26/21-02/28/22- 18 visits   ? Authorization - Visit Number 10   ? Authorization - Number of Visits 18   ? Progress Note Due on Visit 20   ? PT Start Time 1002   ? PT Stop Time 1027   ? PT Time Calculation (min) 40 min   ? Equipment Utilized During Treatment Gait belt   ? Activity Tolerance Patient tolerated treatment well;Patient limited by pain   ? Behavior During Therapy [facility] for tasks assessed/performed   ? ?  ?  ? ?  ? ? ?Past Medical History:  ?Diagnosis Date  ? Arthritis   ? "knees, shoulders, chest" (02/03/2014)  ? Bruises easily   ? Cervical cancer ([place]) 1965  ? Concussion 03/11/2020  ? High cholesterol   ? History of kidney stones   ? Hypertension   ? DENIES   ? Tendency toward bleeding easily ([place])   ? ? ?Past Surgical History:  ?Procedure Laterality Date  ? ABDOMINAL HYSTERECTOMY  1965  ? APPENDECTOMY    ? CHOLECYSTECTOMY    ? KNEE ARTHROSCOPY Left   ? [facility]  ? PARTIAL KNEE ARTHROPLASTY Left 01/26/2019  ? Procedure: UNICOMPARTMENTAL KNEE;  Surgeon: Marchia Bond, MD;  Location: [facility];  Service: Orthopedics;  Laterality: Left;  ? PARTIAL KNEE ARTHROPLASTY Right 12/10/2021  ? Procedure: UNICOMPARTMENTAL KNEE;  Surgeon: Marchia Bond, MD;  Location: [facility];  Service: Orthopedics;  Laterality: Right;  ? SHOULDER OPEN ROTATOR CUFF REPAIR Bilateral 2007,  2010  ? [facility]  ? THROAT SURGERY  2014  ? "arthritis hugh knot" (02/03/2014)  ? ? ?There were no vitals  filed for this visit. ? ? Subjective Assessment - 02/24/22 1008   ? ? Subjective Pt reports to be feeling good today, busy weekend cleaning. Pt reports last session was less provoking of soreness. Pt did not work on her stretches over weekend due to lack of aide coverage.   ? Pertinent History 88 y.o. female pnt presents with right knee pain following a right unicompartmental knee replacement on 12/10/21. [name] reports intermittent "dull" pain that does not travel and denies N/T. pnt reports currently 2/10 pain on NPS, best 0/10, and at worst 8/10. [name] has 2 low steps leading into her home that do not give her any issues. [name] aggravating factors include getting into and out of a car (pnt has to swing her leg into car with her arms), getting into bed, standing for more than 2 minutes, and getting up from a chair. pnt takes NSAIDs for pain relief. Prior to surgery, pnt had caretaker 5x a week from 8am-2pm. Now patient requires additional help from the hours of 4pm-8pm. [name] was ambulating with a single point can prior to surgery and has now been alternating between a rollator and the single point cane. Prior to surgery pnt required a chair to shower and get herself ready at the bathroom sink.  [name] reports getting max help  from her caretakers to complete ADLs. [name] reports no falls within the last 6 months and goals include increasing right knee ROM.   ? Currently in Pain? No/denies   ? ?  ?  ? ?  ? ? ?INTERVENTION THIS DATE:  ?-seated flexion heel slides 1x90sec on slider  ?-seated RLE rotational heel slides 1x90sec on slider  ? ?MET/ROM ?-seated LAQ x10 -> knee fleixon stretch 1x35sec (3 rounds) ?-Supine RLE knee fleixon/HS set x5sec -> LLLD TKE stretch x2 minutes (ice on knee, slight support for stretch comfort) (3 rounds)  ? ?A/ROM, strength ?-hook lying Rt SAQ 1x12x2secH 5lb AW  ?-manually resisted SLR extension 1x10 ?-manually resisted RLE leg press from deep range 1x10 ?-hooklying RLE marching 1x12 ? ?-hook lying Rt  SAQ 1x12x2secH 5lb AW  ?-manually resisted SLR extension 1x10 ?-manually resisted RLE leg press from deep range 1x10 ?-hooklying RLE marching 1x12 ? ?-STS from elevated plinth x10 hands (19" ? ?-STS from elevated plinth x10 hands ? ?---------------- ? ?INTERVENTION 3/31  ?-seated flexion heel slides 1x90sec on slider  ?-seated RLE rotational heel slides 1x60sec on slider  ?  ?-Rt knee flexion stretch seated 3x30sec  ?-RLE calf raise seated, DF start on hedgehog 2x15 ?-RLE ankle DF 2x16 @ 2lb ?  ?-Supine RLE knee extension stretch 1x90sec c concurrent ice pack on knee ?-AA/ROM knee flexion extension x10  ?-Supine RLE knee extension stretch 1x90sec c concurrent ice pack on knee ?-AA/ROM knee flexion extension x10  ?-Supine RLE knee extension stretch 1x90sec c concurrent ice pack on knee ?-AA/ROM knee flexion extension x10  ?  ?-Manually resisted Rt Leg press 1x10 (deep range)  ?-Hooklying bridge 1x10 (significant weakness, unable to achieve lifftoff, but does demo good hamstrings activation)  ?-Manually resisted Rt Leg press 1x10 (deep range)  ?-Hooklying bridge 1x10 (significant weakness, unable to achieve lifftoff, but does demo good hamstrings activation)  ?-Manually resisted Rt Leg press 1x10 (deep range) (gets stronger each time! Could advance to leg press if back tolerates without aggravation) ?  ?-seated LAQ 1x10 @ 4lb (HS limitations)  ?-seated LAQ 1x20 @ 4lb (very limited by hamstrings compared to SAQ) ?-seated knee flexion 1x10 @ red TB  ?-seated knee flexion 1x12 @ yellow TB  ?  ?-Rt SAQ 1x10x2secH 5lb  ?-Rt SAQ 1x10x2secH 5lb ?  ?-------------------------- ? ? ?HEP ?Access Code: [email or web address] ?URL: [email or web address] ?Date: 02/19/2022 ?Prepared by: Rebbeca Paul ?  ?Exercises ?- Hip and Knee Extension and Flexion Caregiver PROM  - 1 x daily - 7 x weekly - 1 sets - 5 reps - 30 hold ?- Standing with Back Flat Against Wall  - 1 x daily - 7 x weekly - 1 sets - 3 reps - 30 hold ?  ? ? ? ? ? ? ? PT  Education - 02/24/22 1013   ? ? Education Details using a timer for consistent stretch hold times   ? Person(s) Educated Patient   ? Methods Explanation   ? Comprehension Verbalized understanding;Returned demonstration   ? ?  ?  ? ?  ? ? ? PT Short Term Goals - 02/14/22 1559   ? ?  ? PT SHORT TERM GOAL #1  ? Title pnt will decrease max NPS number from 8/10 to 6/10 to show clinically significant decrease in pain.   ? Baseline 12/24/21 8/10   ? Time 4   ? Period Weeks   ? Status Achieved   ? Target Date 01/21/22   ?  ? PT SHORT TERM  GOAL #2  ? Title pnt will increase right knee PROM to WNL in order to demonstrate joint integrity needed for normalized gait and transfers   ? Baseline 12/24/21 PROM 94 - 20; 02/14/22: 108-20   ? Time 4   ? Period Weeks   ? Status On-going   ? Target Date 01/21/22   ?  ? PT SHORT TERM GOAL #3  ? Title Patient will demonstrate independence with HEP for strengthening and balance to decrease fall risk   ? Baseline 12/25/21 HEP given; 3/24: not being performed   ? Time 4   ? Period Weeks   ? Status On-going   ? Target Date 01/21/22   ? ?  ?  ? ?  ? ? ? ? PT Long Term Goals - 02/14/22 1601   ? ?  ? PT LONG TERM GOAL #1  ? Title Patient will increase FOTO score to 57 to demonstrate predicted increase in functional mobility to complete ADLs   ? Baseline 12/24/21 29; 02/14/22: 47   ? Time 8   ? Period Weeks   ? Status On-going   ? Target Date 03/18/22   ?  ? PT LONG TERM GOAL #2  ? Title [name] will decrease 5x sit to stand to 14.8 seconds to meet clinically significant norms to decrease community fall risk.   ? Baseline 12/24/21 unable, 60sec c hands; 02/14/22: 16sec c hands   ? Time 8   ? Period Weeks   ? Status On-going   ? Target Date 03/18/22   ?  ? PT LONG TERM GOAL #3  ? Title [name] will imrove 10 meter walk test with single point cane to 0.8 m/s to show limited household ambulation speed.   ? Baseline 12/24/21 .34 m/s; 3/24: on 226f at 0.754m c 4WW   ? Time 8   ? Period Weeks   ? Status On-going   ?  Target Date 03/18/22   ?  ? PT LONG TERM GOAL #4  ? Title [name] will increase gross R knee, and bilat hip abd MMT to 4/5 to increase strength required for functional ADLs   ? Baseline 12/24/21 right flexion a

## 2022-02-25 ENCOUNTER — Encounter: Payer: Medicare HMO | Admitting: Physical Therapy

## 2022-02-25 DIAGNOSIS — M1711 Unilateral primary osteoarthritis, right knee: Secondary | ICD-10-CM | POA: Diagnosis not present

## 2022-02-27 ENCOUNTER — Ambulatory Visit: Payer: Medicare HMO

## 2022-02-27 DIAGNOSIS — M25561 Pain in right knee: Secondary | ICD-10-CM | POA: Diagnosis not present

## 2022-02-27 DIAGNOSIS — G8929 Other chronic pain: Secondary | ICD-10-CM

## 2022-02-27 DIAGNOSIS — M6281 Muscle weakness (generalized): Secondary | ICD-10-CM

## 2022-02-27 DIAGNOSIS — M545 Low back pain, unspecified: Secondary | ICD-10-CM | POA: Diagnosis not present

## 2022-02-27 NOTE — Therapy (Signed)
?[facility] ?[address] ?[place], [place], [address] ?Phone: [phone]   Fax:  [phone] ? ?Physical Therapy Treatment ? ?Patient Details  ?Name: Emily Wu ?MRN: [id number] ?Date of Birth: 1931/01/10 ?Referring Provider (PT): Dr. Mardelle Matte ? ? ?Encounter Date: 02/27/2022 ? ? PT End of Session - 02/27/22 1009   ? ? Visit Number 12   ? Number of Visits 17   ? Date for PT Re-Evaluation 03/18/22   ? Authorization Type [employer]   ? Authorization Time Period cert 12/08/70-05/13/34; Authorization 12/26/21-02/28/22- 18 visits   ? Progress Note Due on Visit 20   ? PT Start Time 1002   ? PT Stop Time 5974   ? PT Time Calculation (min) 38 min   ? Equipment Utilized During Treatment Gait belt   ? Activity Tolerance Patient tolerated treatment well;Patient limited by pain   ? Behavior During Therapy [facility] for tasks assessed/performed   ? ?  ?  ? ?  ? ? ?Past Medical History:  ?Diagnosis Date  ? Arthritis   ? "knees, shoulders, chest" (02/03/2014)  ? Bruises easily   ? Cervical cancer ([place]) 1965  ? Concussion 03/11/2020  ? High cholesterol   ? History of kidney stones   ? Hypertension   ? DENIES   ? Tendency toward bleeding easily ([place])   ? ? ?Past Surgical History:  ?Procedure Laterality Date  ? ABDOMINAL HYSTERECTOMY  1965  ? APPENDECTOMY    ? CHOLECYSTECTOMY    ? KNEE ARTHROSCOPY Left   ? [facility]  ? PARTIAL KNEE ARTHROPLASTY Left 01/26/2019  ? Procedure: UNICOMPARTMENTAL KNEE;  Surgeon: Marchia Bond, MD;  Location: [facility];  Service: Orthopedics;  Laterality: Left;  ? PARTIAL KNEE ARTHROPLASTY Right 12/10/2021  ? Procedure: UNICOMPARTMENTAL KNEE;  Surgeon: Marchia Bond, MD;  [facility];  Service: Orthopedics;  Laterality: Right;  ? SHOULDER OPEN ROTATOR CUFF REPAIR Bilateral 2007,  2010  ? [facility]  ? THROAT SURGERY  2014  ? "arthritis hugh knot" (02/03/2014)  ? ? ?There were no vitals filed for this visit. ? ? Subjective Assessment - 02/27/22 1008   ? ?  Subjective Pt iced knee before session. She reports she was unable to work with her aides on stretches due to some staffing issues.   ? Pertinent History 88 y.o. female pnt presents with right knee pain following a right unicompartmental knee replacement on 12/10/21. [name] reports intermittent "dull" pain that does not travel and denies N/T. pnt reports currently 2/10 pain on NPS, best 0/10, and at worst 8/10. [name] has 2 low steps leading into her home that do not give her any issues. [name] aggravating factors include getting into and out of a car (pnt has to swing her leg into car with her arms), getting into bed, standing for more than 2 minutes, and getting up from a chair. pnt takes NSAIDs for pain relief. Prior to surgery, pnt had caretaker 5x a week from 8am-2pm. Now patient requires additional help from the hours of 4pm-8pm. [name] was ambulating with a single point can prior to surgery and has now been alternating between a rollator and the single point cane. Prior to surgery pnt required a chair to shower and get herself ready at the bathroom sink.  [name] reports getting max help from her caretakers to complete ADLs. [name] reports no falls within the last 6 months and goals include increasing right knee ROM.   ? Limitations Lifting;Walking;House hold activities;Standing   ?  Patient Stated Goals increase knee ROM, return to AMB without device, be able to AMB limited community distances.   ? Currently in Pain? No/denies   ? ?  ?  ? ?  ? ?INTERVENTION THIS DATE 4/6 (today)  ?-AA/ROM Nustep Level 1: 2 minutes seat 6, 2 minutes seat 5 ? ?-Omega knee flexion: 2x10 at 10lbs bilat ?-Omega knee extension: 2x10 @ 5lb with minA from author  ? ?-Supine Knee extension stretch c ice pack on knee x 2 minutes ?-Rt SAQ 1x15 @ 3lb ?-Supine Knee extension stretch c ice pack on knee x 2 minutes ?-Rt SAQ 1x15 @ 3lb ?-Supine Knee extension stretch c ice pack on knee x 2 minutes ?-Rt SAQ 1x15 @ 3lb ?-Supine Knee extension stretch c ice pack  on knee x 2 minutes ?-Rt SAQ 1x15 @ 3lb ? ?-Manually resisted straight leg extension 30 to 0 RLE 3x10 ?-Manually resisted leg press deep range 3x10  ? ? ?------------------------------------------------ ? ?INTERVENTION 4/3  ?-seated flexion heel slides 1x90sec on slider  ?-seated RLE rotational heel slides 1x90sec on slider  ?  ?MET/ROM ?-seated LAQ x10 -> knee fleixon stretch 1x35sec (3 rounds) ?-Supine RLE knee fleixon/HS set x5sec -> LLLD TKE stretch x2 minutes (ice on knee, slight support for stretch comfort) (3 rounds)  ?  ?A/ROM, strength ?-hook lying Rt SAQ 1x12x2secH 5lb AW  ?-manually resisted SLR extension 1x10 ?-manually resisted RLE leg press from deep range 1x10 ?-hooklying RLE marching 1x12 ?  ?-hook lying Rt SAQ 1x12x2secH 5lb AW  ?-manually resisted SLR extension 1x10 ?-manually resisted RLE leg press from deep range 1x10 ?-hooklying RLE marching 1x12 ?  ?-STS from elevated plinth x10 hands (19" ?  ?-STS from elevated plinth x10 hands ?  ?---------------- ?  ?INTERVENTION 3/31  ?-seated flexion heel slides 1x90sec on slider  ?-seated RLE rotational heel slides 1x60sec on slider  ?  ?-Rt knee flexion stretch seated 3x30sec  ?-RLE calf raise seated, DF start on hedgehog 2x15 ?-RLE ankle DF 2x16 @ 2lb ?  ?-Supine RLE knee extension stretch 1x90sec c concurrent ice pack on knee ?-AA/ROM knee flexion extension x10  ?-Supine RLE knee extension stretch 1x90sec c concurrent ice pack on knee ?-AA/ROM knee flexion extension x10  ?-Supine RLE knee extension stretch 1x90sec c concurrent ice pack on knee ?-AA/ROM knee flexion extension x10  ?  ?-Manually resisted Rt Leg press 1x10 (deep range)  ?-Hooklying bridge 1x10 (significant weakness, unable to achieve lifftoff, but does demo good hamstrings activation)  ?-Manually resisted Rt Leg press 1x10 (deep range)  ?-Hooklying bridge 1x10 (significant weakness, unable to achieve lifftoff, but does demo good hamstrings activation)  ?-Manually resisted Rt Leg press  1x10 (deep range) (gets stronger each time! Could advance to leg press if back tolerates without aggravation) ?  ?-seated LAQ 1x10 @ 4lb (HS limitations)  ?-seated LAQ 1x20 @ 4lb (very limited by hamstrings compared to SAQ) ?-seated knee flexion 1x10 @ red TB  ?-seated knee flexion 1x12 @ yellow TB  ?  ?-Rt SAQ 1x10x2secH 5lb  ?-Rt SAQ 1x10x2secH 5lb ?  ?-------------------------- ?  ?  ?HEP ?Access Code: [email or web address] ?URL: [email or web address] ?Date: 02/19/2022 ?Prepared by: Rebbeca Paul ?  ?Exercises ?- Hip and Knee Extension and Flexion Caregiver PROM  - 1 x daily - 7 x weekly - 1 sets - 5 reps - 30 hold ?- Standing with Back Flat Against Wall  - 1 x daily - 7 x weekly - 1 sets - 3 reps - 30 hold ?  ? ? ? ? ?  PT Education - 02/27/22 1012   ? ? Education Details cable resistane machine use   ? Person(s) Educated Patient   ? Methods Explanation;Demonstration   ? Comprehension Verbalized understanding   ? ?  ?  ? ?  ? ? ? PT Short Term Goals - 02/14/22 1559   ? ?  ? PT SHORT TERM GOAL #1  ? Title pnt will decrease max NPS number from 8/10 to 6/10 to show clinically significant decrease in pain.   ? Baseline 12/24/21 8/10   ? Time 4   ? Period Weeks   ? Status Achieved   ? Target Date 01/21/22   ?  ? PT SHORT TERM GOAL #2  ? Title pnt will increase right knee PROM to WNL in order to demonstrate joint integrity needed for normalized gait and transfers   ? Baseline 12/24/21 PROM 94 - 20; 02/14/22: 108-20   ? Time 4   ? Period Weeks   ? Status On-going   ? Target Date 01/21/22   ?  ? PT SHORT TERM GOAL #3  ? Title Patient will demonstrate independence with HEP for strengthening and balance to decrease fall risk   ? Baseline 12/25/21 HEP given; 3/24: not being performed   ? Time 4   ? Period Weeks   ? Status On-going   ? Target Date 01/21/22   ? ?  ?  ? ?  ? ? ? ? PT Long Term Goals - 02/14/22 1601   ? ?  ? PT LONG TERM GOAL #1  ? Title Patient will increase FOTO score to 57 to demonstrate predicted increase in  functional mobility to complete ADLs   ? Baseline 12/24/21 29; 02/14/22: 47   ? Time 8   ? Period Weeks   ? Status On-going   ? Target Date 03/18/22   ?  ? PT LONG TERM GOAL #2  ? Title [name] will decrease 5x sit to st

## 2022-03-03 ENCOUNTER — Ambulatory Visit: Payer: Medicare HMO

## 2022-03-03 DIAGNOSIS — M545 Low back pain, unspecified: Secondary | ICD-10-CM | POA: Diagnosis not present

## 2022-03-03 DIAGNOSIS — M25561 Pain in right knee: Secondary | ICD-10-CM

## 2022-03-03 DIAGNOSIS — M6281 Muscle weakness (generalized): Secondary | ICD-10-CM

## 2022-03-03 DIAGNOSIS — G8929 Other chronic pain: Secondary | ICD-10-CM | POA: Diagnosis not present

## 2022-03-03 NOTE — Therapy (Signed)
[facility] ?[address] ?[place], [place], [address] ?Phone: [phone]   Fax:  [phone] ? ?Physical Therapy Treatment ? ?Patient Details  ?Name: Emily Wu ?MRN: [id number] ?Date of Birth: October 19, 1931 ?Referring Provider (PT): Dr. Mardelle Matte ? ? ?Encounter Date: 03/03/2022 ? ? PT End of Session - 03/03/22 0954   ? ? Visit Number 13   ? Number of Visits 17   ? Date for PT Re-Evaluation 03/18/22   ? Authorization Type [employer]   ? Authorization Time Period cert 0/38/33-3/83/29; Authorization 12/26/21-02/28/22- 18 visits   ? Progress Note Due on Visit 20   ? PT Start Time 1000   ? PT Stop Time 1916   ? PT Time Calculation (min) 40 min   ? Equipment Utilized During Treatment Gait belt   ? Activity Tolerance Patient tolerated treatment well;Patient limited by pain   ? Behavior During Therapy [facility] for tasks assessed/performed   ? ?  ?  ? ?  ? ? ?Past Medical History:  ?Diagnosis Date  ? Arthritis   ? "knees, shoulders, chest" (02/03/2014)  ? Bruises easily   ? Cervical cancer ([place]) 1965  ? Concussion 03/11/2020  ? High cholesterol   ? History of kidney stones   ? Hypertension   ? DENIES   ? Tendency toward bleeding easily ([place])   ? ? ?Past Surgical History:  ?Procedure Laterality Date  ? ABDOMINAL HYSTERECTOMY  1965  ? APPENDECTOMY    ? CHOLECYSTECTOMY    ? KNEE ARTHROSCOPY Left   ? [facility]  ? PARTIAL KNEE ARTHROPLASTY Left 01/26/2019  ? Procedure: UNICOMPARTMENTAL KNEE;  Surgeon: Marchia Bond, MD;  Location: [facility];  Service: Orthopedics;  Laterality: Left;  ? PARTIAL KNEE ARTHROPLASTY Right 12/10/2021  ? Procedure: UNICOMPARTMENTAL KNEE;  Surgeon: Marchia Bond, MD;  Location: [facility];  Service: Orthopedics;  Laterality: Right;  ? SHOULDER OPEN ROTATOR CUFF REPAIR Bilateral 2007,  2010  ? [facility]  ? THROAT SURGERY  2014  ? "arthritis hugh knot" (02/03/2014)  ? ? ?There were no vitals filed for this visit. ? ? Subjective Assessment - 03/03/22 0954   ? ?  Subjective Pt doing well today. No updates since prior visit.   ? Pertinent History 88 y.o. female pnt presents with right knee pain following a right unicompartmental knee replacement on 12/10/21. [name] reports intermittent "dull" pain that does not travel and denies N/T. pnt reports currently 2/10 pain on NPS, best 0/10, and at worst 8/10. [name] has 2 low steps leading into her home that do not give her any issues. [name] aggravating factors include getting into and out of a car (pnt has to swing her leg into car with her arms), getting into bed, standing for more than 2 minutes, and getting up from a chair. pnt takes NSAIDs for pain relief. Prior to surgery, pnt had caretaker 5x a week from 8am-2pm. Now patient requires additional help from the hours of 4pm-8pm. [name] was ambulating with a single point can prior to surgery and has now been alternating between a rollator and the single point cane. Prior to surgery pnt required a chair to shower and get herself ready at the bathroom sink.  [name] reports getting max help from her caretakers to complete ADLs. [name] reports no falls within the last 6 months and goals include increasing right knee ROM.   ? Currently in Pain? No/denies   ? ?  ?  ? ?  ? ?-5xSTS: 13.52sec  03/03/22 (hands on knees, chair +small airex); 16.5sec (push-off armrests) 02/14/22, 60sec @ eval ? ?Intervention this date:  ?-AA/ROM Nustep Level 1: 2 minutes seat 6, 2 minutes seat 5 ? ?-leaning against wall, extension to upright stance 1x15, 1x60 sec (BUE on chair back for support)  ? ?-seated Rt knee flexion 3x30sec, Knee extension stretch 3x30sec  ? ?-326f AMB c 4WW, emphasis on use of RW for upright stance, 0.866m  ?-recovery interval  ?-30055fMB c 4WW, emphasis on use of RW for upright stance, 0.32m44m?-recovery interval  ? ?-seated Rt LAQ 2x15 @ 2lb  ? ?-400ft27f c 4WW, emphasis on use of RW for upright stance 0.68m/s70mecovery interval  ? ?-STS hands on knees 1x10 (chair + big airex) good symmetry   ?-seated Green TB clam 1x15  ?-seated RLE knee flexion RTB 1x15  ? ? ? ? ?------------------------------------------ ? ? ?INTERVENTION 4/6   ?-AA/ROM Nustep Level 1: 2 minutes seat 6, 2 minutes seat 5 ?  ?-Omega knee flexion: 2x10 at 10lbs bilat ?-Omega knee extension: 2x10 @ 5lb with minA from author  ?  ?-Supine Knee extension stretch c ice pack on knee x 2 minutes ?-Rt SAQ 1x15 @ 3lb ?-Supine Knee extension stretch c ice pack on knee x 2 minutes ?-Rt SAQ 1x15 @ 3lb ?-Supine Knee extension stretch c ice pack on knee x 2 minutes ?-Rt SAQ 1x15 @ 3lb ?-Supine Knee extension stretch c ice pack on knee x 2 minutes ?-Rt SAQ 1x15 @ 3lb ?  ?-Manually resisted straight leg extension 30 to 0 RLE 3x10 ?-Manually resisted leg press deep range 3x10  ?  ?  ?------------------------------------------------ ?  ?INTERVENTION 4/3  ?-seated flexion heel slides 1x90sec on slider  ?-seated RLE rotational heel slides 1x90sec on slider  ?  ?MET/ROM ?-seated LAQ x10 -> knee fleixon stretch 1x35sec (3 rounds) ?-Supine RLE knee fleixon/HS set x5sec -> LLLD TKE stretch x2 minutes (ice on knee, slight support for stretch comfort) (3 rounds)  ?  ?A/ROM, strength ?-hook lying Rt SAQ 1x12x2secH 5lb AW  ?-manually resisted SLR extension 1x10 ?-manually resisted RLE leg press from deep range 1x10 ?-hooklying RLE marching 1x12 ?  ?-hook lying Rt SAQ 1x12x2secH 5lb AW  ?-manually resisted SLR extension 1x10 ?-manually resisted RLE leg press from deep range 1x10 ?-hooklying RLE marching 1x12 ?  ?-STS from elevated plinth x10 hands (19" ?-STS from elevated plinth x10 hands ?  ?---------------- ?  ?INTERVENTION 3/31  ?-seated flexion heel slides 1x90sec on slider  ?-seated RLE rotational heel slides 1x60sec on slider  ?  ?-Rt knee flexion stretch seated 3x30sec  ?-RLE calf raise seated, DF start on hedgehog 2x15 ?-RLE ankle DF 2x16 @ 2lb ?  ?-Supine RLE knee extension stretch 1x90sec c concurrent ice pack on knee ?-AA/ROM knee flexion extension x10   ?-Supine RLE knee extension stretch 1x90sec c concurrent ice pack on knee ?-AA/ROM knee flexion extension x10  ?-Supine RLE knee extension stretch 1x90sec c concurrent ice pack on knee ?-AA/ROM knee flexion extension x10  ?  ?-Manually resisted Rt Leg press 1x10 (deep range)  ?-Hooklying bridge 1x10 (significant weakness, unable to achieve lifftoff, but does demo good hamstrings activation)  ?-Manually resisted Rt Leg press 1x10 (deep range)  ?-Hooklying bridge 1x10 (significant weakness, unable to achieve lifftoff, but does demo good hamstrings activation)  ?-Manually resisted Rt Leg press 1x10 (deep range) (gets stronger each time! Could advance to leg press if back tolerates without aggravation) ?  ?-seated LAQ  1x10 @ 4lb (HS limitations)  ?-seated LAQ 1x20 @ 4lb (very limited by hamstrings compared to SAQ) ?-seated knee flexion 1x10 @ red TB  ?-seated knee flexion 1x12 @ yellow TB  ?  ?-Rt SAQ 1x10x2secH 5lb  ?-Rt SAQ 1x10x2secH 5lb ?  ?-------------------------- ?  ?  ?HEP ?Access Code: [email or web address] ?URL: [email or web address] ?Date: 02/19/2022 ?Prepared by: Rebbeca Paul ?  ?Exercises ?- Hip and Knee Extension and Flexion Caregiver PROM  - 1 x daily - 7 x weekly - 1 sets - 5 reps - 30 hold ?- Standing with Back Flat Against Wall  - 1 x daily - 7 x weekly - 1 sets - 3 reps - 30 hold ? ? ? ? PT Education - 03/03/22 0955   ? ? Education Details focus on improving walking   ? Person(s) Educated Patient   ? Methods Explanation;Demonstration   ? Comprehension Verbalized understanding   ? ?  ?  ? ?  ? ? ? PT Short Term Goals - 02/14/22 1559   ? ?  ? PT SHORT TERM GOAL #1  ? Title pnt will decrease max NPS number from 8/10 to 6/10 to show clinically significant decrease in pain.   ? Baseline 12/24/21 8/10   ? Time 4   ? Period Weeks   ? Status Achieved   ? Target Date 01/21/22   ?  ? PT SHORT TERM GOAL #2  ? Title pnt will increase right knee PROM to WNL in order to demonstrate joint integrity needed for  normalized gait and transfers   ? Baseline 12/24/21 PROM 94 - 20; 02/14/22: 108-20   ? Time 4   ? Period Weeks   ? Status On-going   ? Target Date 01/21/22   ?  ? PT SHORT TERM GOAL #3  ? Title Patient will demonstrate

## 2022-03-05 ENCOUNTER — Ambulatory Visit: Payer: Medicare HMO

## 2022-03-05 DIAGNOSIS — M25561 Pain in right knee: Secondary | ICD-10-CM | POA: Diagnosis not present

## 2022-03-05 DIAGNOSIS — M6281 Muscle weakness (generalized): Secondary | ICD-10-CM | POA: Diagnosis not present

## 2022-03-05 DIAGNOSIS — M545 Low back pain, unspecified: Secondary | ICD-10-CM

## 2022-03-05 DIAGNOSIS — G8929 Other chronic pain: Secondary | ICD-10-CM | POA: Diagnosis not present

## 2022-03-05 NOTE — Therapy (Signed)
[facility] ?[address] ?[place], [place], [address] ?Phone: [phone]   Fax:  [phone] ? ?Physical Therapy Treatment ? ?Patient Details  ?Name: Emily Wu ?MRN: [id number] ?Date of Birth: [birth year] ?Referring Provider (PT): Dr. Mardelle Matte ? ? ?Encounter Date: 03/05/2022 ? ? PT End of Session - 03/05/22 1026   ? ? Visit Number 14   ? Number of Visits 24   ? Date for PT Re-Evaluation 03/18/22   ? Authorization Type [employer]   ? Authorization Time Period cert 8/93/81-0/17/51; Authorization 12/26/21-02/28/22- 18 visits   ? Progress Note Due on Visit 20   ? PT Start Time 1001   ? PT Stop Time 0258   ? PT Time Calculation (min) 38 min   ? Activity Tolerance Patient tolerated treatment well   ? Behavior During Therapy [facility] for tasks assessed/performed   ? ?  ?  ? ?  ? ? ?Past Medical History:  ?Diagnosis Date  ? Arthritis   ? "knees, shoulders, chest" (02/03/2014)  ? Bruises easily   ? Cervical cancer ([place]) 1965  ? Concussion 03/11/2020  ? High cholesterol   ? History of kidney stones   ? Hypertension   ? DENIES   ? Tendency toward bleeding easily ([place])   ? ? ?Past Surgical History:  ?Procedure Laterality Date  ? ABDOMINAL HYSTERECTOMY  1965  ? APPENDECTOMY    ? CHOLECYSTECTOMY    ? KNEE ARTHROSCOPY Left   ? [facility]  ? PARTIAL KNEE ARTHROPLASTY Left 01/26/2019  ? Procedure: UNICOMPARTMENTAL KNEE;  Surgeon: Marchia Bond, MD;  Location: [facility];  Service: Orthopedics;  Laterality: Left;  ? PARTIAL KNEE ARTHROPLASTY Right 12/10/2021  ? Procedure: UNICOMPARTMENTAL KNEE;  Surgeon: Marchia Bond, MD;  [facility];  Service: Orthopedics;  Laterality: Right;  ? SHOULDER OPEN ROTATOR CUFF REPAIR Bilateral 2007,  2010  ? [facility]  ? THROAT SURGERY  2014  ? "arthritis hugh knot" (02/03/2014)  ? ? ?There were no vitals filed for this visit. ? ? Subjective Assessment - 03/05/22 1019   ? ? Subjective Pt feels good today, was busy with DTR shopping yesterday. Knee is  around 1-2/10 durign session.   ? Pertinent History 88 y.o. female pnt presents with right knee pain following a right unicompartmental knee replacement on 12/10/21. [name] reports intermittent "dull" pain that does not travel and denies N/T. pnt reports currently 2/10 pain on NPS, best 0/10, and at worst 8/10. [name] has 2 low steps leading into her home that do not give her any issues. [name] aggravating factors include getting into and out of a car (pnt has to swing her leg into car with her arms), getting into bed, standing for more than 2 minutes, and getting up from a chair. pnt takes NSAIDs for pain relief. Prior to surgery, pnt had caretaker 5x a week from 8am-2pm. Now patient requires additional help from the hours of 4pm-8pm. [name] was ambulating with a single point can prior to surgery and has now been alternating between a rollator and the single point cane. Prior to surgery pnt required a chair to shower and get herself ready at the bathroom sink.  [name] reports getting max help from her caretakers to complete ADLs. [name] reports no falls within the last 6 months and goals include increasing right knee ROM.   ? Currently in Pain? No/denies   ? ?  ?  ? ?  ? ? ? ?-5xSTS: 13.52sec 03/03/22 (hands on  knees, chair +small airex); 16.5sec (push-off armrests) 02/14/22, 60sec @ eval ?  ?Intervention this date: ?-AA/ROM Nustep 2.5 minutes seat 6, 2.5 minutes seat 5; level 1, arms 8 ? ?-Seated knee flexion stretch 3x45sec ?-seated TKE stretch 3x45sec ? ?-STS hands on knees 2x10 (chair + big airex) good symmetry  ? ?-Seated RLE LAQ 1x12 @ 2.5lb (increased from 2lb)  ?-standing Rt knee flexion 2.5lb leaning on 4WW 1x10 ?-Seated RLE LAQ 1x12 @ 2.5lb (increased from 2lb)  ?-standing Rt knee flexion 2.5lb leaning on 4WW 1x10 ?-Seated RLE LAQ 1x12 @ 2.5lb (increased from 2lb)  ? ?-313f AMB overground with rollator, cues for tall posture (0.944m, no DOE)  ? ?-RLE Leg press 2x10 @ 5lb (central foot placement)  ? ? ? ?Intervention 4/10   ?-AA/ROM Nustep Level 1: 2 minutes seat 6, 2 minutes seat 5 ?  ?-leaning against wall, extension to upright stance 1x15, 1x60 sec (BUE on chair back for support)  ?  ?-seated Rt knee flexion 3x30sec, Knee extension stretch 3x30sec  ?  ?-30020fMB c 4WW, emphasis on use of RW for upright stance, 0.58m59m?-recovery interval  ?-300ft68f c 4WW, emphasis on use of RW for upright stance, 0.92m/s32mrecovery interval  ?  ?-seated Rt LAQ 2x15 @ 2lb  ?  ?-400ft A62f 4WW, emphasis on use of RW for upright stance 0.100m/s ?75movery interval  ?  ?-STS hands on knees 1x10 (chair + big airex) good symmetry  ?-seated Green TB clam 1x15  ?-seated RLE knee flexion RTB 1x15  ?  ? ------------------------------------------ ?   ?INTERVENTION 4/6   ?-AA/ROM Nustep Level 1: 2 minutes seat 6, 2 minutes seat 5 ?  ?-Omega knee flexion: 2x10 at 10lbs bilat ?-Omega knee extension: 2x10 @ 5lb with minA from author  ?  ?-Supine Knee extension stretch c ice pack on knee x 2 minutes ?-Rt SAQ 1x15 @ 3lb ?-Supine Knee extension stretch c ice pack on knee x 2 minutes ?-Rt SAQ 1x15 @ 3lb ?-Supine Knee extension stretch c ice pack on knee x 2 minutes ?-Rt SAQ 1x15 @ 3lb ?-Supine Knee extension stretch c ice pack on knee x 2 minutes ?-Rt SAQ 1x15 @ 3lb ?  ?-Manually resisted straight leg extension 30 to 0 RLE 3x10 ?-Manually resisted leg press deep range 3x10  ? ? ? ? ? PT Education - 03/05/22 1028   ? ? Education Details addiitonal education on stretching compliance, added in 2 new stretches.   ? Person(s) Educated Patient   ? Methods Explanation;Demonstration   ? Comprehension Verbalized understanding   ? ?  ?  ? ?  ? ? ? PT Short Term Goals - 02/14/22 1559   ? ?  ? PT SHORT TERM GOAL #1  ? Title pnt will decrease max NPS number from 8/10 to 6/10 to show clinically significant decrease in pain.   ? Baseline 12/24/21 8/10   ? Time 4   ? Period Weeks   ? Status Achieved   ? Target Date 01/21/22   ?  ? PT SHORT TERM GOAL #2  ? Title pnt will increase  right knee PROM to WNL in order to demonstrate joint integrity needed for normalized gait and transfers   ? Baseline 12/24/21 PROM 94 - 20; 02/14/22: 108-20   ? Time 4   ? Period Weeks   ? Status On-going   ? Target Date 01/21/22   ?  ? PT SHORT TERM GOAL #3  ? Title  Patient will demonstrate independence with HEP for strengthening and balance to decrease fall risk   ? Baseline 12/25/21 HEP given; 3/24: not being performed   ? Time 4   ? Period Weeks   ? Status On-going   ? Target Date 01/21/22   ? ?  ?  ? ?  ? ? ? ? PT Long Term Goals - 02/14/22 1601   ? ?  ? PT LONG TERM GOAL #1  ? Title Patient will increase FOTO score to 57 to demonstrate predicted increase in functional mobility to complete ADLs   ? Baseline 12/24/21 29; 02/14/22: 47   ? Time 8   ? Period Weeks   ? Status On-going   ? Target Date 03/18/22   ?  ? PT LONG TERM GOAL #2  ? Title [name] will decrease 5x sit to stand to 14.8 seconds to meet clinically significant norms to decrease community fall risk.   ? Baseline 12/24/21 unable, 60sec c hands; 02/14/22: 16sec c hands   ? Time 8   ? Period Weeks   ? Status On-going   ? Target Date 03/18/22   ?  ? PT LONG TERM GOAL #3  ? Title [name] will imrove 10 meter walk test with single point cane to 0.8 m/s to show limited household ambulation speed.   ? Baseline 12/24/21 .34 m/s; 3/24: on 277f at 0.772m c 4WW   ? Time 8   ? Period Weeks   ? Status On-going   ? Target Date 03/18/22   ?  ? PT LONG TERM GOAL #4  ? Title [name] will increase gross R knee, and bilat hip abd MMT to 4/5 to increase strength required for functional ADLs   ? Baseline 12/24/21 right flexion and extension: 3-/5; bilat hip abd 3/5   ? Time 8   ? Target Date 03/18/22   ?  ? PT LONG TERM GOAL #5  ? Title Patient will demonstrate R knee AROM of at least 110-10d to demonstrate symmetrical mobility needed for modI with gait and basic transfers   ? Baseline 12/24/21 91-25d; 02/14/22: 108-20   ? Time 8   ? Period Weeks   ? Status On-going   ? Target Date 03/18/22    ? ?  ?  ? ?  ? ? ? ? ? ? ? ? Plan - 03/05/22 1030   ? ? Clinical Impression Statement Conitnued with heavy emphasis on ROM work for [employer] regaining activity tolerance to walking. Used adn emph

## 2022-03-10 ENCOUNTER — Ambulatory Visit: Payer: Medicare HMO | Admitting: Physical Therapy

## 2022-03-10 NOTE — Patient Instructions (Incomplete)
Intervention this date: ?-AA/ROM Nustep 2.5 minutes seat 6, 2.5 minutes seat 5; level 1, arms 8 ?  ?-Seated knee flexion stretch 3x45sec ?-seated TKE stretch 3x45sec ?  ?-STS hands on knees 2x10 (chair + big airex) good symmetry  ?  ?-Seated RLE LAQ 1x12 @ 2.5lb (increased from 2lb)  ?-standing Rt knee flexion 2.5lb leaning on 4WW 1x10 ?-Seated RLE LAQ 1x12 @ 2.5lb (increased from 2lb)  ?-standing Rt knee flexion 2.5lb leaning on 4WW 1x10 ?-Seated RLE LAQ 1x12 @ 2.5lb (increased from 2lb)  ?  ?-333f AMB overground with rollator, cues for tall posture (0.966m, no DOE)  ?  ?-RLE Leg press 2x10 @ 5lb (central foot placement)  ?  ?  ?  ?Intervention 4/10  ?-AA/ROM Nustep Level 1: 2 minutes seat 6, 2 minutes seat 5 ?  ?-leaning against wall, extension to upright stance 1x15, 1x60 sec (BUE on chair back for support)  ?  ?-seated Rt knee flexion 3x30sec, Knee extension stretch 3x30sec  ?  ?-30063fMB c 4WW, emphasis on use of RW for upright stance, 0.63m32m?-recovery interval  ?-300ft46f c 4WW, emphasis on use of RW for upright stance, 0.74m/s13mrecovery interval  ?  ?-seated Rt LAQ 2x15 @ 2lb  ?  ?-400ft A19f 4WW, emphasis on use of RW for upright stance 0.14m/s ?79movery interval  ?  ?-STS hands on knees 1x10 (chair + big airex) good symmetry  ?-seated Green TB clam 1x15  ?-seated RLE knee flexion RTB 1x15  ?

## 2022-03-11 DIAGNOSIS — F0150 Vascular dementia without behavioral disturbance: Secondary | ICD-10-CM | POA: Diagnosis not present

## 2022-03-11 DIAGNOSIS — Z8616 Personal history of COVID-19: Secondary | ICD-10-CM | POA: Diagnosis not present

## 2022-03-11 DIAGNOSIS — E039 Hypothyroidism, unspecified: Secondary | ICD-10-CM | POA: Diagnosis not present

## 2022-03-11 DIAGNOSIS — Z9189 Other specified personal risk factors, not elsewhere classified: Secondary | ICD-10-CM | POA: Diagnosis not present

## 2022-03-11 DIAGNOSIS — G309 Alzheimer's disease, unspecified: Secondary | ICD-10-CM | POA: Diagnosis not present

## 2022-03-11 DIAGNOSIS — F0280 Dementia in other diseases classified elsewhere without behavioral disturbance: Secondary | ICD-10-CM | POA: Diagnosis not present

## 2022-03-11 DIAGNOSIS — F5104 Psychophysiologic insomnia: Secondary | ICD-10-CM | POA: Diagnosis not present

## 2022-03-12 ENCOUNTER — Encounter: Payer: Self-pay | Admitting: Physical Therapy

## 2022-03-12 ENCOUNTER — Ambulatory Visit: Payer: Medicare HMO | Admitting: Physical Therapy

## 2022-03-12 DIAGNOSIS — M6281 Muscle weakness (generalized): Secondary | ICD-10-CM

## 2022-03-12 DIAGNOSIS — M545 Low back pain, unspecified: Secondary | ICD-10-CM

## 2022-03-12 DIAGNOSIS — L538 Other specified erythematous conditions: Secondary | ICD-10-CM | POA: Diagnosis not present

## 2022-03-12 DIAGNOSIS — L820 Inflamed seborrheic keratosis: Secondary | ICD-10-CM | POA: Diagnosis not present

## 2022-03-12 DIAGNOSIS — X32XXXA Exposure to sunlight, initial encounter: Secondary | ICD-10-CM | POA: Diagnosis not present

## 2022-03-12 DIAGNOSIS — L570 Actinic keratosis: Secondary | ICD-10-CM | POA: Diagnosis not present

## 2022-03-12 DIAGNOSIS — M25561 Pain in right knee: Secondary | ICD-10-CM | POA: Diagnosis not present

## 2022-03-12 DIAGNOSIS — G8929 Other chronic pain: Secondary | ICD-10-CM | POA: Diagnosis not present

## 2022-03-12 NOTE — Therapy (Signed)
[facility] ?[address] ?[place], [place], [address] ?Phone: [phone]   Fax:  [phone] ? ?Physical Therapy Treatment ? ?Patient Details  ?Name: Emily Wu ?MRN: [id number] ?Date of Birth: Nov 10, 1931 ?Referring Provider (PT): Dr. Mardelle Matte ? ? ?Encounter Date: 03/12/2022 ? ? PT End of Session - 03/12/22 1007   ? ? Visit Number 15   ? Number of Visits 24   ? Date for PT Re-Evaluation 03/18/22   ? Authorization Type [employer]   ? Authorization Time Period cert 4/81/85-6/31/49; Authorization 12/26/21-02/28/22- 18 visits   ? Progress Note Due on Visit 20   ? PT Start Time 1003   ? PT Stop Time 1045   ? PT Time Calculation (min) 42 min   ? Activity Tolerance Patient tolerated treatment well   ? Behavior During Therapy [facility] for tasks assessed/performed   ? ?  ?  ? ?  ? ? ?Past Medical History:  ?Diagnosis Date  ? Arthritis   ? "knees, shoulders, chest" (02/03/2014)  ? Bruises easily   ? Cervical cancer ([place]) 1965  ? Concussion 03/11/2020  ? High cholesterol   ? History of kidney stones   ? Hypertension   ? DENIES   ? Tendency toward bleeding easily ([place])   ? ? ?Past Surgical History:  ?Procedure Laterality Date  ? ABDOMINAL HYSTERECTOMY  1965  ? APPENDECTOMY    ? CHOLECYSTECTOMY    ? KNEE ARTHROSCOPY Left   ? [facility]  ? PARTIAL KNEE ARTHROPLASTY Left 01/26/2019  ? Procedure: UNICOMPARTMENTAL KNEE;  Surgeon: Marchia Bond, MD;  Location: [facility];  Service: Orthopedics;  Laterality: Left;  ? PARTIAL KNEE ARTHROPLASTY Right 12/10/2021  ? Procedure: UNICOMPARTMENTAL KNEE;  Surgeon: Marchia Bond, MD;  Location: [facility];  Service: Orthopedics;  Laterality: Right;  ? SHOULDER OPEN ROTATOR CUFF REPAIR Bilateral 2007,  2010  ? [facility]  ? THROAT SURGERY  2014  ? "arthritis hugh knot" (02/03/2014)  ? ? ?There were no vitals filed for this visit. ? ? Subjective Assessment - 03/12/22 1006   ? ? Subjective Pt doing well today. Denies pain and reports compliance with HEP.  States she believes her ROM has improved.   ? Pertinent History 88 y.o. female pnt presents with right knee pain following a right unicompartmental knee replacement on 12/10/21. [name] reports intermittent "dull" pain that does not travel and denies N/T. pnt reports currently 2/10 pain on NPS, best 0/10, and at worst 8/10. [name] has 2 low steps leading into her home that do not give her any issues. [name] aggravating factors include getting into and out of a car (pnt has to swing her leg into car with her arms), getting into bed, standing for more than 2 minutes, and getting up from a chair. pnt takes NSAIDs for pain relief. Prior to surgery, pnt had caretaker 5x a week from 8am-2pm. Now patient requires additional help from the hours of 4pm-8pm. [name] was ambulating with a single point can prior to surgery and has now been alternating between a rollator and the single point cane. Prior to surgery pnt required a chair to shower and get herself ready at the bathroom sink.  [name] reports getting max help from her caretakers to complete ADLs. [name] reports no falls within the last 6 months and goals include increasing right knee ROM.   ? Currently in Pain? No/denies   ? ?  ?  ? ?  ? ? ? ?INTERVENTION ?-Nustep  level 2 for 5 minutes seat 5, arms 6 ?  ?-Seated knee flexion stretch 3x60sec ?-seated TKE stretch 3x60sec ?  ?-STS with airex pad, hands on knees 2x10 ?-RLE Leg press 2x10 5#, 1x10 10# ?-RLE LAQ 3# AW 3x10  ?-standing Rt knee flexion 3# AW, UE support on chair 3x10 ?-seated Green TB clam with pause at end range 3x10 ?-leaning against wall, trunk extension to upright stance 2 minutes (BUE on chair back for support)  ?-331f AMB overground with rollator, cues for tall posture and proximity to walker. ?  ?  ?Clinical Impression: Pt is pleasant and motivated throughout session. POC was continued for optimal RLE strengthening and right knee ROM. Pt challenged with sets increased from 2 to 3; she tolerated increase in leg press  resistance well with report of weight being heavy. Practiced ambulation using [id number] cuing for upright posture. She does report fatigue in postural muscles during ambulation. Performed following postural training at wall. Patient would benefit from continued skilled therapy to address ROM and strength deficits in order to improve functional mobility and return to PLOF.  ? ? ? ? ? ? ? ? ? ? ? ? ? ? ? ? ? ? ? PT Short Term Goals - 02/14/22 1559   ? ?  ? PT SHORT TERM GOAL #1  ? Title pnt will decrease max NPS number from 8/10 to 6/10 to show clinically significant decrease in pain.   ? Baseline 12/24/21 8/10   ? Time 4   ? Period Weeks   ? Status Achieved   ? Target Date 01/21/22   ?  ? PT SHORT TERM GOAL #2  ? Title pnt will increase right knee PROM to WNL in order to demonstrate joint integrity needed for normalized gait and transfers   ? Baseline 12/24/21 PROM 94 - 20; 02/14/22: 108-20   ? Time 4   ? Period Weeks   ? Status On-going   ? Target Date 01/21/22   ?  ? PT SHORT TERM GOAL #3  ? Title Patient will demonstrate independence with HEP for strengthening and balance to decrease fall risk   ? Baseline 12/25/21 HEP given; 3/24: not being performed   ? Time 4   ? Period Weeks   ? Status On-going   ? Target Date 01/21/22   ? ?  ?  ? ?  ? ? ? ? PT Long Term Goals - 02/14/22 1601   ? ?  ? PT LONG TERM GOAL #1  ? Title Patient will increase FOTO score to 57 to demonstrate predicted increase in functional mobility to complete ADLs   ? Baseline 12/24/21 29; 02/14/22: 47   ? Time 8   ? Period Weeks   ? Status On-going   ? Target Date 03/18/22   ?  ? PT LONG TERM GOAL #2  ? Title [name] will decrease 5x sit to stand to 14.8 seconds to meet clinically significant norms to decrease community fall risk.   ? Baseline 12/24/21 unable, 60sec c hands; 02/14/22: 16sec c hands   ? Time 8   ? Period Weeks   ? Status On-going   ? Target Date 03/18/22   ?  ? PT LONG TERM GOAL #3  ? Title [name] will imrove 10 meter walk test with single point cane to  0.8 m/s to show limited household ambulation speed.   ? Baseline 12/24/21 .34 m/s; 3/24: on 2049fat 0.7678mc 4WW   ? Time 8   ?  Period Weeks   ? Status On-going   ? Target Date 03/18/22   ?  ? PT LONG TERM GOAL #4  ? Title [name] will increase gross R knee, and bilat hip abd MMT to 4/5 to increase strength required for functional ADLs   ? Baseline 12/24/21 right flexion and extension: 3-/5; bilat hip abd 3/5   ? Time 8   ? Target Date 03/18/22   ?  ? PT LONG TERM GOAL #5  ? Title Patient will demonstrate R knee AROM of at least 110-10d to demonstrate symmetrical mobility needed for modI with gait and basic transfers   ? Baseline 12/24/21 91-25d; 02/14/22: 108-20   ? Time 8   ? Period Weeks   ? Status On-going   ? Target Date 03/18/22   ? ?  ?  ? ?  ? ? ? ? ? ? ? ? Plan - 03/12/22 1239   ? ? Clinical Impression Statement Pt is pleasant and motivated throughout session. POC was continued for optimal RLE strengthening and right knee ROM. Pt challenged with sets increased from 2 to 3; she tolerated increase in leg press resistance well with report of weight being heavy. Practiced ambulation using [id number]; cuing for upright posture. She does report fatigue in postural muscles during ambulation. Performed following postural training at wall. Patient would benefit from continued skilled therapy to address ROM and strength deficits in order to improve functional mobility and return to PLOF.   ? Personal Factors and Comorbidities Age;Fitness;Past/Current Experience;Comorbidity 3+;Time since onset of injury/illness/exacerbation   ? Comorbidities HLD, OA multiple joints, GERD, CKD   ? Examination-Activity Limitations Bathing;Carry;Lift;Stand;Bed Mobility;Locomotion Level;Bend;Squat;Hygiene/Grooming;Stairs   ? Examination-Participation Restrictions Cleaning;Community Activity;Yard Work;Laundry   ? Stability/Clinical Decision Making Stable/Uncomplicated   ? Rehab Potential Fair   ? PT Frequency 2x / week   ? PT Duration 8 weeks   ? PT  Treatment/Interventions ADLs/Self Care Home Management;Aquatic Therapy;Cryotherapy;Electrical Stimulation;Biofeedback;Iontophoresis '4mg'$ /ml Dexamethasone;Moist Heat;Traction;Ultrasound;Parrafin;Fluidtherapy;Con

## 2022-03-17 ENCOUNTER — Ambulatory Visit: Payer: Medicare HMO | Admitting: Physical Therapy

## 2022-03-17 ENCOUNTER — Encounter: Payer: Self-pay | Admitting: Physical Therapy

## 2022-03-17 DIAGNOSIS — M6281 Muscle weakness (generalized): Secondary | ICD-10-CM | POA: Diagnosis not present

## 2022-03-17 DIAGNOSIS — G8929 Other chronic pain: Secondary | ICD-10-CM

## 2022-03-17 DIAGNOSIS — M25561 Pain in right knee: Secondary | ICD-10-CM

## 2022-03-17 DIAGNOSIS — M545 Low back pain, unspecified: Secondary | ICD-10-CM | POA: Diagnosis not present

## 2022-03-17 NOTE — Therapy (Signed)
[facility] ?[address] ?[place], [place], [address] ?Phone: [phone]   Fax:  [phone] ? ?Physical Therapy Treatment ? ?Patient Details  ?Name: Emily Wu ?MRN: [id number] ?Date of Birth: [birth year] ?Referring Provider (PT): Dr. Mardelle Matte ? ? ?Encounter Date: 03/17/2022 ? ? PT End of Session - 03/17/22 1054   ? ? Visit Number 16   ? Number of Visits 24   ? Date for PT Re-Evaluation 03/18/22   ? Authorization Type [employer]   ? Authorization Time Period cert 12/13/39-7/40/81; Authorization 12/26/21-02/28/22- 18 visits   ? Progress Note Due on Visit 20   ? PT Start Time 1050   ? PT Stop Time 1120   ? PT Time Calculation (min) 30 min   ? Activity Tolerance Patient tolerated treatment well   ? Behavior During Therapy [facility] for tasks assessed/performed   ? ?  ?  ? ?  ? ? ?Past Medical History:  ?Diagnosis Date  ? Arthritis   ? "knees, shoulders, chest" (02/03/2014)  ? Bruises easily   ? Cervical cancer ([place]) 1965  ? Concussion 03/11/2020  ? High cholesterol   ? History of kidney stones   ? Hypertension   ? DENIES   ? Tendency toward bleeding easily ([place])   ? ? ?Past Surgical History:  ?Procedure Laterality Date  ? ABDOMINAL HYSTERECTOMY  1965  ? APPENDECTOMY    ? CHOLECYSTECTOMY    ? KNEE ARTHROSCOPY Left   ? [facility]  ? PARTIAL KNEE ARTHROPLASTY Left 01/26/2019  ? Procedure: UNICOMPARTMENTAL KNEE;  Surgeon: Marchia Bond, MD;  Location: [facility];  Service: Orthopedics;  Laterality: Left;  ? PARTIAL KNEE ARTHROPLASTY Right 12/10/2021  ? Procedure: UNICOMPARTMENTAL KNEE;  Surgeon: Marchia Bond, MD;  Location: [facility];  Service: Orthopedics;  Laterality: Right;  ? SHOULDER OPEN ROTATOR CUFF REPAIR Bilateral 2007,  2010  ? [facility]  ? THROAT SURGERY  2014  ? "arthritis hugh knot" (02/03/2014)  ? ? ?There were no vitals filed for this visit. ? ? Subjective Assessment - 03/17/22 1052   ? ? Subjective Pt states she has been busy working in her house and out in the  courtyard. Describes it as "fidgety" and unable to sit still. Denies pain in knee; states medial knee feels "funny" regarding sensations to light touch. Pt interested in beginning PT for back soon. Asking for recommendations on back braces to hold her trunk into extension. Pt asking to end session early. She reports that her close neighbor had a heart attack and passed away last night.   ? Pertinent History 88 y.o. female pnt presents with right knee pain following a right unicompartmental knee replacement on 12/10/21. [name] reports intermittent "dull" pain that does not travel and denies N/T. pnt reports currently 2/10 pain on NPS, best 0/10, and at worst 8/10. [name] has 2 low steps leading into her home that do not give her any issues. [name] aggravating factors include getting into and out of a car (pnt has to swing her leg into car with her arms), getting into bed, standing for more than 2 minutes, and getting up from a chair. pnt takes NSAIDs for pain relief. Prior to surgery, pnt had caretaker 5x a week from 8am-2pm. Now patient requires additional help from the hours of 4pm-8pm. [name] was ambulating with a single point can prior to surgery and has now been alternating between a rollator and the single point cane. Prior to surgery pnt required a chair  to shower and get herself ready at the bathroom sink.  [name] reports getting max help from her caretakers to complete ADLs. [name] reports no falls within the last 6 months and goals include increasing right knee ROM.   ? Currently in Pain? No/denies   ? ?  ?  ? ?  ? ? ? ?INTERVENTION ?-Nustep level 2 for 5 minutes seat 5, arms 6 ?  ?-Seated knee flexion stretch 2x60sec ?-seated TKE stretch 2x60sec ?  ?-STS with airex pad, hands over chest 2x10 ?-RLE Leg press, 5# 1x10, 10# 2x10  ?-leaning against wall, trunk extension to upright stance 2 minutes (BUE on chair back for support)  ?-heel raises 2x10 with UE support of chair  ? ?  ? ?Next session: ?-RLE LAQ 3# AW 3x10  ?-standing  Rt knee flexion 3# AW, UE support on chair 3x10 ?-seated Green TB clam with pause at end range 3x10 ? ?  ?Clinical Impression: Pt is pleasant throughout session. She is a little distracted and lacking motivation this date due to the unexpected loss of a close neighbor last night. Pt asked if session could be cut short today. POC was continued with similar weights and exercises. She demonstrated ability to bear full weight on RLE however balance was poor. Standing heel raises were very challenging. She was unable to maintain posture on the wall without significant UE support. Patient would benefit from continued skilled therapy to address ROM and strength deficits in order to improve functional mobility and return to PLOF.  ? ? ? ? ? ? ? ? ? ? PT Short Term Goals - 02/14/22 1559   ? ?  ? PT SHORT TERM GOAL #1  ? Title pnt will decrease max NPS number from 8/10 to 6/10 to show clinically significant decrease in pain.   ? Baseline 12/24/21 8/10   ? Time 4   ? Period Weeks   ? Status Achieved   ? Target Date 01/21/22   ?  ? PT SHORT TERM GOAL #2  ? Title pnt will increase right knee PROM to WNL in order to demonstrate joint integrity needed for normalized gait and transfers   ? Baseline 12/24/21 PROM 94 - 20; 02/14/22: 108-20   ? Time 4   ? Period Weeks   ? Status On-going   ? Target Date 01/21/22   ?  ? PT SHORT TERM GOAL #3  ? Title Patient will demonstrate independence with HEP for strengthening and balance to decrease fall risk   ? Baseline 12/25/21 HEP given; 3/24: not being performed   ? Time 4   ? Period Weeks   ? Status On-going   ? Target Date 01/21/22   ? ?  ?  ? ?  ? ? ? ? PT Long Term Goals - 02/14/22 1601   ? ?  ? PT LONG TERM GOAL #1  ? Title Patient will increase FOTO score to 57 to demonstrate predicted increase in functional mobility to complete ADLs   ? Baseline 12/24/21 29; 02/14/22: 47   ? Time 8   ? Period Weeks   ? Status On-going   ? Target Date 03/18/22   ?  ? PT LONG TERM GOAL #2  ? Title [name] will  decrease 5x sit to stand to 14.8 seconds to meet clinically significant norms to decrease community fall risk.   ? Baseline 12/24/21 unable, 60sec c hands; 02/14/22: 16sec c hands   ? Time 8   ? Period Weeks   ?  Status On-going   ? Target Date 03/18/22   ?  ? PT LONG TERM GOAL #3  ? Title [name] will imrove 10 meter walk test with single point cane to 0.8 m/s to show limited household ambulation speed.   ? Baseline 12/24/21 .34 m/s; 3/24: on 247f at 0.749m c 4WW   ? Time 8   ? Period Weeks   ? Status On-going   ? Target Date 03/18/22   ?  ? PT LONG TERM GOAL #4  ? Title [name] will increase gross R knee, and bilat hip abd MMT to 4/5 to increase strength required for functional ADLs   ? Baseline 12/24/21 right flexion and extension: 3-/5; bilat hip abd 3/5   ? Time 8   ? Target Date 03/18/22   ?  ? PT LONG TERM GOAL #5  ? Title Patient will demonstrate R knee AROM of at least 110-10d to demonstrate symmetrical mobility needed for modI with gait and basic transfers   ? Baseline 12/24/21 91-25d; 02/14/22: 108-20   ? Time 8   ? Period Weeks   ? Status On-going   ? Target Date 03/18/22   ? ?  ?  ? ?  ? ? ? ? ? ? ? ? Plan - 03/17/22 1131   ? ? Clinical Impression Statement Pt is pleasant throughout session. She is a little distracted and lacking motivation this date due to the unexpected loss of a close neighbor last night. Pt asked if session could be cut short today. POC was continued with similar weights and exercises. She demonstrated ability to bear full weight on RLE however balance was poor. Standing heel raises were very challenging. She was unable to maintain posture on the wall without significant UE support. Patient would benefit from continued skilled therapy to address ROM and strength deficits in order to improve functional mobility and return to PLOF.   ? Personal Factors and Comorbidities Age;Fitness;Past/Current Experience;Comorbidity 3+;Time since onset of injury/illness/exacerbation   ? Comorbidities HLD, OA  multiple joints, GERD, CKD   ? Examination-Activity Limitations Bathing;Carry;Lift;Stand;Bed Mobility;Locomotion Level;Bend;Squat;Hygiene/Grooming;Stairs   ? Examination-Participation Restrictions Cleaning;Communit

## 2022-03-19 ENCOUNTER — Ambulatory Visit: Payer: Medicare HMO | Admitting: Physical Therapy

## 2022-03-19 ENCOUNTER — Encounter: Payer: Self-pay | Admitting: Physical Therapy

## 2022-03-19 DIAGNOSIS — G8929 Other chronic pain: Secondary | ICD-10-CM

## 2022-03-19 DIAGNOSIS — M25561 Pain in right knee: Secondary | ICD-10-CM | POA: Diagnosis not present

## 2022-03-19 DIAGNOSIS — M545 Low back pain, unspecified: Secondary | ICD-10-CM | POA: Diagnosis not present

## 2022-03-19 DIAGNOSIS — M6281 Muscle weakness (generalized): Secondary | ICD-10-CM | POA: Diagnosis not present

## 2022-03-19 NOTE — Therapy (Signed)
[facility] ?[address] ?[place], [place], [address] ?Phone: [phone]   Fax:  [phone] ? ?Physical Therapy Treatment/Physical Therapy Re-Certification Note  ? ? ?Dates of reporting period  02/14/22   to   03/19/22 ? ? ?Patient Details  ?Name: Emily Wu ?MRN: [id number] ?Date of Birth: May 18, 1931 ?Referring Provider (PT): Dr. Mardelle Matte ? ? ?Encounter Date: 03/19/2022 ? ? PT End of Session - 03/19/22 1229   ? ? Visit Number 17   ? Number of Visits 33   ? Date for PT Re-Evaluation 05/14/22   ? Authorization Type [employer]   ? Authorization Time Period cert 9/35/70-1/77/93; Authorization 12/26/21-02/28/22- 18 visits   ? Progress Note Due on Visit 20   ? PT Start Time 1047   ? PT Stop Time 1130   ? PT Time Calculation (min) 43 min   ? Activity Tolerance Patient tolerated treatment well   ? Behavior During Therapy [facility] for tasks assessed/performed   ? ?  ?  ? ?  ? ? ?Past Medical History:  ?Diagnosis Date  ? Arthritis   ? "knees, shoulders, chest" (02/03/2014)  ? Bruises easily   ? Cervical cancer ([place]) 1965  ? Concussion 03/11/2020  ? High cholesterol   ? History of kidney stones   ? Hypertension   ? DENIES   ? Tendency toward bleeding easily ([place])   ? ? ?Past Surgical History:  ?Procedure Laterality Date  ? ABDOMINAL HYSTERECTOMY  1965  ? APPENDECTOMY    ? CHOLECYSTECTOMY    ? KNEE ARTHROSCOPY Left   ? [facility]  ? PARTIAL KNEE ARTHROPLASTY Left 01/26/2019  ? Procedure: UNICOMPARTMENTAL KNEE;  Surgeon: Marchia Bond, MD;  Location: [facility];  Service: Orthopedics;  Laterality: Left;  ? PARTIAL KNEE ARTHROPLASTY Right 12/10/2021  ? Procedure: UNICOMPARTMENTAL KNEE;  Surgeon: Marchia Bond, MD;  Location: [facility];  Service: Orthopedics;  Laterality: Right;  ? SHOULDER OPEN ROTATOR CUFF REPAIR Bilateral 2007,  2010  ? [facility]  ? THROAT SURGERY  2014  ? "arthritis hugh knot" (02/03/2014)  ? ? ?There were no vitals filed for this visit. ? ? Subjective Assessment -  03/19/22 1051   ? ? Subjective Pt states she is doing well today. Arrives wearing a lightweight thoracic brace to assist with upright posture. States she is getting acupuncture tomorrow for her back and that she feels her back is getting weaker. She reports mild sensitivity at night inferior to knee that has been present since surgery. Denies pain.   ? Pertinent History 88 y.o. female pnt presents with right knee pain following a right unicompartmental knee replacement on 12/10/21. [name] reports intermittent "dull" pain that does not travel and denies N/T. pnt reports currently 2/10 pain on NPS, best 0/10, and at worst 8/10. [name] has 2 low steps leading into her home that do not give her any issues. [name] aggravating factors include getting into and out of a car (pnt has to swing her leg into car with her arms), getting into bed, standing for more than 2 minutes, and getting up from a chair. pnt takes NSAIDs for pain relief. Prior to surgery, pnt had caretaker 5x a week from 8am-2pm. Now patient requires additional help from the hours of 4pm-8pm. [name] was ambulating with a single point can prior to surgery and has now been alternating between a rollator and the single point cane. Prior to surgery pnt required a chair to shower and get herself ready at  the bathroom sink.  [name] reports getting max help from her caretakers to complete ADLs. [name] reports no falls within the last 6 months and goals include increasing right knee ROM.   ? Currently in Pain? No/denies   ? ?  ?  ? ?  ? ? ? ?RE-CERTIFICATION ? ?OBJECTIVE - GOALS ?- R knee PROM: 5-109 ?- HEP compliance: not compliant however states she is fidgety and moving around a lot at home ?- FOTO: 40 ?- 5xSTS: 20 seconds  ?- 10MWT (adjust to 4WW): 10.5 seconds >> 0.41ms ?- MMT  ?     Knee extension (R): 4+ ?     Knee flexion (R): 4 ?     Hip abduction (bilateral): 4 bilateral ?- R knee AROM (10-110 degrees): 5-102 ?  ? ? ?INTERVENTION ?-Nustep level 2 for 5 minutes seat 5, arms  6 ?  ?-Seated knee flexion stretch 2x60sec ?-seated TKE stretch 2x60sec ? ?-RLE LAQ 3# AW 3x10  ?-standing Rt knee flexion 3# AW, UE support on 4WW 3x10 ?  ?-trunk extension against wall, hold about 4 minutes ? -performed TKE in this position, RedTB 2x10 ?-heel raises 2x10 with UE support of 4WW ?  ?Fatigue at end of session.  ? ? ?  ?Next session: ?*create goals regarding back  ? -Oswestry disability index  ?-seated Green TB clam with pause at end range 3x10 ?-STS with airex pad, hands over chest 2x10 ?-RLE Leg press, 5# 1x10, 10# 2x10  ?  ? ? ?  ?Clinical Impression: Pt is pleasant throughout session. She has made progress in both strength and ROM in her right lower extremity. Actively she continues to lack about 8 degrees of flexion compared to her left leg. Gait velocity has increased to >0.812m indicating safe household ambulation speed. Although improved, she has room to improve in 5xSTS. Pt would like to transition into addressing her back pain and posture. Will plan to perform outcome measures and create goals regarding back next session. Patient would benefit from continued skilled therapy to address ROM and posture deficits in order to improve functional mobility and participation in community activities.  ? ? ? ?  ? ? ? ? ? ? PT Short Term Goals - 03/19/22 1231   ? ?  ? PT SHORT TERM GOAL #1  ? Title pnt will decrease max NPS number from 8/10 to 6/10 to show clinically significant decrease in pain.   ? Baseline 12/24/21 8/10   ? Time 4   ? Period Weeks   ? Status Achieved   ? Target Date 01/21/22   ?  ? PT SHORT TERM GOAL #2  ? Title pnt will increase right knee PROM to WNL in order to demonstrate joint integrity needed for normalized gait and transfers   ? Baseline 12/24/21 PROM 94 - 20; 02/14/22: 108-20; 03/19/22: 109-5   ? Time 4   ? Period Weeks   ? Status On-going   ? Target Date 04/16/22   ?  ? PT SHORT TERM GOAL #3  ? Title Patient will demonstrate independence with HEP for strengthening and balance to  decrease fall risk   ? Baseline 12/25/21 HEP given; 3/24: not being performed; 03/19/22: not being performed   ? Time 4   ? Period Weeks   ? Status On-going   ? Target Date 04/16/22   ? ?  ?  ? ?  ? ? ? ? PT Long Term Goals - 03/19/22 1233   ? ?  ?  PT LONG TERM GOAL #1  ? Title Patient will increase FOTO score to 57 to demonstrate predicted increase in functional mobility to complete ADLs   ? Baseline 12/24/21 29; 02/14/22: 47; 03/19/22: 40   ? Time 8   ? Period Weeks   ? Status On-going   ? Target Date 05/14/22   ?  ? PT LONG TERM GOAL #2  ? Title [name] will decrease 5x sit to stand to 14.8 seconds to meet clinically significant norms to decrease community fall risk.   ? Baseline 12/24/21 unable, 60sec c hands; 02/14/22: 16sec c hands; 03/19/22: 20 seconds c hands   ? Time 8   ? Period Weeks   ? Status On-going   ? Target Date 05/14/22   ?  ? PT LONG TERM GOAL #3  ? Title [name] will imrove 10 meter walk test with 4WW to 0.8 m/s to show limited household ambulation speed.   ? Baseline 12/24/21 .34 m/s; 3/24: on 276f at 0.79m c 4WW; 03/19/22: 0.9597mw/ 4WW1OX? Time 8   ? Period Weeks   ? Status Achieved   ? Target Date 03/18/22   ?  ? PT LONG TERM GOAL #4  ? Title [name] will increase gross R knee, and bilat hip abd MMT to 4/5 to increase strength required for functional ADLs   ? Baseline 12/24/21 right flexion and extension: 3-/5; bilat hip abd 3/5; 03/19/22: R knee ext 4+, flexion 4 and bilateral hip abd 4   ? Time 8   ? Status Achieved   ? Target Date 03/18/22   ?  ? PT LONG TERM GOAL #5  ? Title Patient will demonstrate R knee AROM of at least 110-10d to demonstrate symmetrical mobility needed for modI with gait and basic transfers   ? Baseline 12/24/21 91-25d; 02/14/22: 108-20; 03/19/22: 102-5   ? Time 8   ? Period Weeks   ? Status On-going   ? Target Date 05/14/22   ? ?  ?  ? ?  ? ? ? ? ? ? ? ? Plan - 03/19/22 1246   ? ? Clinical Impression Statement Pt is pleasant throughout session. She has made progress in both strength and  ROM in her right lower extremity. Actively she continues to lack about 8 degrees of flexion compared to her left leg. Gait velocity has increased to >0.63m/63mndicating safe household ambulation speed. Alth

## 2022-03-24 ENCOUNTER — Encounter: Payer: Self-pay | Admitting: Physical Therapy

## 2022-03-24 ENCOUNTER — Ambulatory Visit: Payer: Medicare HMO | Attending: Orthopedic Surgery | Admitting: Physical Therapy

## 2022-03-24 DIAGNOSIS — M6281 Muscle weakness (generalized): Secondary | ICD-10-CM | POA: Insufficient documentation

## 2022-03-24 DIAGNOSIS — G8929 Other chronic pain: Secondary | ICD-10-CM | POA: Diagnosis not present

## 2022-03-24 DIAGNOSIS — M25561 Pain in right knee: Secondary | ICD-10-CM | POA: Diagnosis not present

## 2022-03-24 DIAGNOSIS — G543 Thoracic root disorders, not elsewhere classified: Secondary | ICD-10-CM | POA: Insufficient documentation

## 2022-03-24 DIAGNOSIS — M545 Low back pain, unspecified: Secondary | ICD-10-CM | POA: Insufficient documentation

## 2022-03-24 NOTE — Therapy (Signed)
[facility] ?[address] ?[place], [place], [address] ?Phone: [phone]   Fax:  [phone] ? ?Physical Therapy Treatment ? ?Patient Details  ?Name: Emily Wu ?MRN: [id number] ?Date of Birth: 02-11-1931 ?Referring Provider (PT): Dr. Mardelle Matte ? ? ?Encounter Date: 03/24/2022 ? ? PT End of Session - 03/24/22 1052   ? ? Visit Number 18   ? Number of Visits 33   ? Date for PT Re-Evaluation 05/14/22   ? Authorization Type [employer]   ? Authorization Time Period cert 8/52/77-07/17/22; Authorization 12/26/21-02/28/22- 18 visits   ? Progress Note Due on Visit 20   ? PT Start Time 1046   ? PT Stop Time 1128   ? PT Time Calculation (min) 42 min   ? Activity Tolerance Patient tolerated treatment well   ? Behavior During Therapy [facility] for tasks assessed/performed   ? ?  ?  ? ?  ? ? ?Past Medical History:  ?Diagnosis Date  ? Arthritis   ? "knees, shoulders, chest" (02/03/2014)  ? Bruises easily   ? Cervical cancer ([place]) 1965  ? Concussion 03/11/2020  ? High cholesterol   ? History of kidney stones   ? Hypertension   ? DENIES   ? Tendency toward bleeding easily ([place])   ? ? ?Past Surgical History:  ?Procedure Laterality Date  ? ABDOMINAL HYSTERECTOMY  1965  ? APPENDECTOMY    ? CHOLECYSTECTOMY    ? KNEE ARTHROSCOPY Left   ? [facility]  ? PARTIAL KNEE ARTHROPLASTY Left 01/26/2019  ? Procedure: UNICOMPARTMENTAL KNEE;  Surgeon: Marchia Bond, MD;  Location: [facility];  Service: Orthopedics;  Laterality: Left;  ? PARTIAL KNEE ARTHROPLASTY Right 12/10/2021  ? Procedure: UNICOMPARTMENTAL KNEE;  Surgeon: Marchia Bond, MD;  [facility];  Service: Orthopedics;  Laterality: Right;  ? SHOULDER OPEN ROTATOR CUFF REPAIR Bilateral 2007,  2010  ? [facility]  ? THROAT SURGERY  2014  ? "arthritis hugh knot" (02/03/2014)  ? ? ?There were no vitals filed for this visit. ? ? Subjective Assessment - 03/24/22 1049   ? ? Subjective Pt arrives with no back brace this date. States she only wore the  thoracic brace that one day and it didn't make a difference. She feels as though her back has "freed up a bit" since acupunture last week. Denies pain.   ? Pertinent History 88 y.o. female pnt presents with right knee pain following a right unicompartmental knee replacement on 12/10/21. [name] reports intermittent "dull" pain that does not travel and denies N/T. pnt reports currently 2/10 pain on NPS, best 0/10, and at worst 8/10. [name] has 2 low steps leading into her home that do not give her any issues. [name] aggravating factors include getting into and out of a car (pnt has to swing her leg into car with her arms), getting into bed, standing for more than 2 minutes, and getting up from a chair. pnt takes NSAIDs for pain relief. Prior to surgery, pnt had caretaker 5x a week from 8am-2pm. Now patient requires additional help from the hours of 4pm-8pm. [name] was ambulating with a single point can prior to surgery and has now been alternating between a rollator and the single point cane. Prior to surgery pnt required a chair to shower and get herself ready at the bathroom sink.  [name] reports getting max help from her caretakers to complete ADLs. [name] reports no falls within the last 6 months and goals include increasing right knee ROM.   ?  Currently in Pain? No/denies   ? ?  ?  ? ?  ? ? ? ?Modified Oswestry - 16/50 >> 32% ? ?  ?INTERVENTION ?-Nustep level 2 for 5 minutes seat 6, arms 9 ?  ?-STS with airex pad, hands on knees 3x10 (thin airex pad caused knee discomfort; knees okay with thick pad) ?-seated thoracic extension with longitudinal towel roll along spine, lifted small RedPB overhead, 3x10 ?-seated row, RedTB, 3x10 ? ? ?-trunk extension against wall, hold about 2 minutes ? ?-Thomas hip flexor stretch, x60 seconds  ? ?  ?Next session:  ?-bridges ?-knee flexion/extension stretch  ?  ?  ?  ?  ?Clinical Impression: Pt is pleasant throughout session. Last session indicated pt is progressing nicely towards knee goals; PT  increased back exercises per pt preference as pt is satisfied with knee. She presents with tight hip flexors bilaterally, right more than left. Active thoracic extension was challenging and fatiguing; over-activation of hip flexors leads to decreased lumbar extension and sacral sitting. Patient would benefit from continued skilled therapy to address ROM and posture deficits in order to improve functional mobility and participation in community activities.  ? ? ? ? ? ? ? ? ? ? PT Short Term Goals - 03/19/22 1231   ? ?  ? PT SHORT TERM GOAL #1  ? Title pnt will decrease max NPS number from 8/10 to 6/10 to show clinically significant decrease in pain.   ? Baseline 12/24/21 8/10   ? Time 4   ? Period Weeks   ? Status Achieved   ? Target Date 01/21/22   ?  ? PT SHORT TERM GOAL #2  ? Title pnt will increase right knee PROM to WNL in order to demonstrate joint integrity needed for normalized gait and transfers   ? Baseline 12/24/21 PROM 94 - 20; 02/14/22: 108-20; 03/19/22: 109-5   ? Time 4   ? Period Weeks   ? Status On-going   ? Target Date 04/16/22   ?  ? PT SHORT TERM GOAL #3  ? Title Patient will demonstrate independence with HEP for strengthening and balance to decrease fall risk   ? Baseline 12/25/21 HEP given; 3/24: not being performed; 03/19/22: not being performed   ? Time 4   ? Period Weeks   ? Status On-going   ? Target Date 04/16/22   ? ?  ?  ? ?  ? ? ? ? PT Long Term Goals - 03/24/22 1249   ? ?  ? PT LONG TERM GOAL #1  ? Title Patient will increase FOTO score to 57 to demonstrate predicted increase in functional mobility to complete ADLs   ? Baseline 12/24/21 29; 02/14/22: 47; 03/19/22: 40   ? Time 8   ? Period Weeks   ? Status On-going   ? Target Date 05/14/22   ?  ? PT LONG TERM GOAL #2  ? Title [name] will decrease 5x sit to stand to 14.8 seconds to meet clinically significant norms to decrease community fall risk.   ? Baseline 12/24/21 unable, 60sec c hands; 02/14/22: 16sec c hands; 03/19/22: 20 seconds c hands   ? Time 8    ? Period Weeks   ? Status On-going   ? Target Date 05/14/22   ?  ? PT LONG TERM GOAL #3  ? Title [name] will imrove 10 meter walk test with 4WW to 0.8 m/s to show limited household ambulation speed.   ? Baseline 12/24/21 .34 m/s; 3/24: on  279f at 0.719m c 4WW; 03/19/22: 0.9511mw/ 4WW0PQ? Time 8   ? Period Weeks   ? Status Achieved   ? Target Date 03/18/22   ?  ? PT LONG TERM GOAL #4  ? Title [name] will increase gross R knee, and bilat hip abd MMT to 4/5 to increase strength required for functional ADLs   ? Baseline 12/24/21 right flexion and extension: 3-/5; bilat hip abd 3/5; 03/19/22: R knee ext 4+, flexion 4 and bilateral hip abd 4   ? Time 8   ? Status Achieved   ? Target Date 03/18/22   ?  ? PT LONG TERM GOAL #5  ? Title Patient will demonstrate R knee AROM of at least 110-10d to demonstrate symmetrical mobility needed for modI with gait and basic transfers   ? Baseline 12/24/21 91-25d; 02/14/22: 108-20; 03/19/22: 102-5   ? Time 8   ? Period Weeks   ? Status On-going   ? Target Date 05/14/22   ?  ? Additional Long Term Goals  ? Additional Long Term Goals Yes   ?  ? PT LONG TERM GOAL #6  ? Title Pt will decrease mODI score by at least 13 points in order demonstrate clinically significant reduction in back pain/disability.   ? Baseline 03/24/22: 16/50 (32%)   ? Time 8   ? Period Weeks   ? Status New   ? Target Date 05/14/22   ? ?  ?  ? ?  ? ? ? ? ? ? ? ? Plan - 03/24/22 1246   ? ? Clinical Impression Statement Pt is pleasant throughout session. Last session indicated pt is progressing nicely towards knee goals; PT increased back exercises per pt preference as pt is satisfied with knee. She presents with tight hip flexors bilaterally, right more than left. Active thoracic extension was challenging and fatiguing; over-activation of hip flexors leads to decreased lumbar extension and sacral sitting. Patient would benefit from continued skilled therapy to address ROM and posture deficits in order to improve functional  mobility and participation in community activities.   ? Personal Factors and Comorbidities Age;Fitness;Past/Current Experience;Comorbidity 3+;Time since onset of injury/illness/exacerbation   ? Comorbidities HLD, OA

## 2022-03-25 ENCOUNTER — Telehealth: Payer: Self-pay

## 2022-03-25 NOTE — Telephone Encounter (Signed)
930 am.  Phone call made to daughter [name] to follow up on any concerns she may have regarding patient and to schedule home visit.  Unable to leave a message as VM is full.  Phone call made to patient and visit scheduled for May 11 @ 10 am.  ?

## 2022-03-26 ENCOUNTER — Ambulatory Visit: Payer: Medicare HMO | Admitting: Physical Therapy

## 2022-03-26 ENCOUNTER — Encounter: Payer: Self-pay | Admitting: Physical Therapy

## 2022-03-26 DIAGNOSIS — G543 Thoracic root disorders, not elsewhere classified: Secondary | ICD-10-CM | POA: Diagnosis not present

## 2022-03-26 DIAGNOSIS — M545 Low back pain, unspecified: Secondary | ICD-10-CM

## 2022-03-26 DIAGNOSIS — M25561 Pain in right knee: Secondary | ICD-10-CM

## 2022-03-26 DIAGNOSIS — M6281 Muscle weakness (generalized): Secondary | ICD-10-CM | POA: Diagnosis not present

## 2022-03-26 DIAGNOSIS — G8929 Other chronic pain: Secondary | ICD-10-CM | POA: Diagnosis not present

## 2022-03-26 NOTE — Therapy (Signed)
[place] [facility] ?[address] ?[place], [place], [address] ?Phone: [phone]   Fax:  [phone] ? ?Physical Therapy Treatment/Physical Therapy Discharge Note ? ? ?Dates of reporting period  12/24/21   to   03/26/22 ? ? ?Patient Details  ?Name: Emily Wu ?MRN: [id number] ?Date of Birth: 1931/07/24 ?Referring Provider (PT): Dr. Mardelle Matte ? ? ?Encounter Date: 03/26/2022 ? ? PT End of Session - 03/26/22 1118   ? ? Visit Number 19   ? Number of Visits 33   ? Date for PT Re-Evaluation 05/14/22   ? Authorization Type [employer]   ? Authorization Time Period cert 2/45/80-9/98/33; Authorization 12/26/21-02/28/22- 18 visits   ? Progress Note Due on Visit 20   ? PT Start Time 1050   ? PT Stop Time 1130   ? PT Time Calculation (min) 40 min   ? Activity Tolerance Patient tolerated treatment well   ? Behavior During Therapy [facility] for tasks assessed/performed   ? ?  ?  ? ?  ? ? ?Past Medical History:  ?Diagnosis Date  ? Arthritis   ? "knees, shoulders, chest" (02/03/2014)  ? Bruises easily   ? Cervical cancer ([place]) 1965  ? Concussion 03/11/2020  ? High cholesterol   ? History of kidney stones   ? Hypertension   ? DENIES   ? Tendency toward bleeding easily ([place])   ? ? ?Past Surgical History:  ?Procedure Laterality Date  ? ABDOMINAL HYSTERECTOMY  1965  ? APPENDECTOMY    ? CHOLECYSTECTOMY    ? KNEE ARTHROSCOPY Left   ? [facility]  ? PARTIAL KNEE ARTHROPLASTY Left 01/26/2019  ? Procedure: UNICOMPARTMENTAL KNEE;  Surgeon: Marchia Bond, MD;  Location: [place];  Service: Orthopedics;  Laterality: Left;  ? PARTIAL KNEE ARTHROPLASTY Right 12/10/2021  ? Procedure: UNICOMPARTMENTAL KNEE;  Surgeon: Marchia Bond, MD;  Location[facility];  Service: Orthopedics;  Laterality: Right;  ? SHOULDER OPEN ROTATOR CUFF REPAIR Bilateral 2007,  2010  ? [facility]  ? THROAT SURGERY  2014  ? "arthritis hugh knot" (02/03/2014)  ? ? ?There were no vitals filed for this visit. ? ? Subjective Assessment - 03/26/22 1054    ? ? Subjective Pt states she is dragging today and did not really want to come to PT. Denies pain. She did have acupuncture yesterday.   ? Pertinent History 88 y.o. female pnt presents with right knee pain following a right unicompartmental knee replacement on 12/10/21. [name] reports intermittent "dull" pain that does not travel and denies N/T. pnt reports currently 2/10 pain on NPS, best 0/10, and at worst 8/10. [name] has 2 low steps leading into her home that do not give her any issues. [name] aggravating factors include getting into and out of a car (pnt has to swing her leg into car with her arms), getting into bed, standing for more than 2 minutes, and getting up from a chair. pnt takes NSAIDs for pain relief. Prior to surgery, pnt had caretaker 5x a week from 8am-2pm. Now patient requires additional help from the hours of 4pm-8pm. [name] was ambulating with a single point can prior to surgery and has now been alternating between a rollator and the single point cane. Prior to surgery pnt required a chair to shower and get herself ready at the bathroom sink.  [name] reports getting max help from her caretakers to complete ADLs. [name] reports no falls within the last 6 months and goals include increasing right knee ROM.   ?  Currently in Pain? No/denies   ? ?  ?  ? ?  ? ? ? ? ?INTERVENTION ?-Nustep level 2 for 5 minutes seat 6, arms 9 ?  ?-STS with airex pad, hands on knees 3x10 (thin airex pad caused knee discomfort; knees okay with thick pad) ?-seated thoracic extension with longitudinal half foam along spine, with shoulder flexion using PVC for increased extension, 3x10 ?-seated row, RedTB, 3x10 ?  ?-seated knee flexion stretch with glider, 2x30 seconds  ?  ?-trunk extension against wall with assist of SPC, hold about 1 minute ? ?-seated trunk extension with hands on knees, repeat 10x w/ 5 second hold each  ?  ?-Thomas hip flexor stretch, x60 seconds each  ?-bridge 3x10 ?-hook lying clam RedTB, 3x10 ? ?-supine knee extension  stretch with OP by PT - 2x60 sec  ?  ? ? ?  ?Clinical Impression: Pt is pleasant throughout session. Addressed both knee and spine/posture. Pt is satisfied with progress made with knee and would like to focus solely on back. Pt demonstrates fatigue with STS and mild knee discomfort - pt unsure if related to weather. Constance Haw are performed through partial ROM due to weakness and muscle tightness. Plan to perform another evaluation of the back next session as pt obtained another back referral from another doctor. Will transition to treatment of back. Discharging this date; pt has been scheduled for Back Evaluation next week. ? ? ? ? ? ? ? ? PT Short Term Goals - 03/19/22 1231   ? ?  ? PT SHORT TERM GOAL #1  ? Title pnt will decrease max NPS number from 8/10 to 6/10 to show clinically significant decrease in pain.   ? Baseline 12/24/21 8/10   ? Time 4   ? Period Weeks   ? Status Achieved   ? Target Date 01/21/22   ?  ? PT SHORT TERM GOAL #2  ? Title pnt will increase right knee PROM to WNL in order to demonstrate joint integrity needed for normalized gait and transfers   ? Baseline 12/24/21 PROM 94 - 20; 02/14/22: 108-20; 03/19/22: 109-5   ? Time 4   ? Period Weeks   ? Status On-going   ? Target Date 04/16/22   ?  ? PT SHORT TERM GOAL #3  ? Title Patient will demonstrate independence with HEP for strengthening and balance to decrease fall risk   ? Baseline 12/25/21 HEP given; 3/24: not being performed; 03/19/22: not being performed   ? Time 4   ? Period Weeks   ? Status On-going   ? Target Date 04/16/22   ? ?  ?  ? ?  ? ? ? ? PT Long Term Goals - 03/24/22 1249   ? ?  ? PT LONG TERM GOAL #1  ? Title Patient will increase FOTO score to 57 to demonstrate predicted increase in functional mobility to complete ADLs   ? Baseline 12/24/21 29; 02/14/22: 47; 03/19/22: 40   ? Time 8   ? Period Weeks   ? Status On-going   ? Target Date 05/14/22   ?  ? PT LONG TERM GOAL #2  ? Title [name] will decrease 5x sit to stand to 14.8 seconds to meet  clinically significant norms to decrease community fall risk.   ? Baseline 12/24/21 unable, 60sec c hands; 02/14/22: 16sec c hands; 03/19/22: 20 seconds c hands   ? Time 8   ? Period Weeks   ? Status On-going   ? Target  Date 05/14/22   ?  ? PT LONG TERM GOAL #3  ? Title [name] will imrove 10 meter walk test with 4WW to 0.8 m/s to show limited household ambulation speed.   ? Baseline 12/24/21 .34 m/s; 3/24: on 221f at 0.72m c 4WW; 03/19/22: 0.9516mw/ 4WW8NO? Time 8   ? Period Weeks   ? Status Achieved   ? Target Date 03/18/22   ?  ? PT LONG TERM GOAL #4  ? Title [name] will increase gross R knee, and bilat hip abd MMT to 4/5 to increase strength required for functional ADLs   ? Baseline 12/24/21 right flexion and extension: 3-/5; bilat hip abd 3/5; 03/19/22: R knee ext 4+, flexion 4 and bilateral hip abd 4   ? Time 8   ? Status Achieved   ? Target Date 03/18/22   ?  ? PT LONG TERM GOAL #5  ? Title Patient will demonstrate R knee AROM of at least 110-10d to demonstrate symmetrical mobility needed for modI with gait and basic transfers   ? Baseline 12/24/21 91-25d; 02/14/22: 108-20; 03/19/22: 102-5   ? Time 8   ? Period Weeks   ? Status On-going   ? Target Date 05/14/22   ?  ? Additional Long Term Goals  ? Additional Long Term Goals Yes   ?  ? PT LONG TERM GOAL #6  ? Title Pt will decrease mODI score by at least 13 points in order demonstrate clinically significant reduction in back pain/disability.   ? Baseline 03/24/22: 16/50 (32%)   ? Time 8   ? Period Weeks   ? Status New   ? Target Date 05/14/22   ? ?  ?  ? ?  ? ? ? ? ? ? ? ? Plan - 03/26/22 1234   ? ? Clinical Impression Statement Pt is pleasant throughout session. Addressed both knee and spine/posture. Pt demonstrates fatigue with STS and mild knee discomfort - pt unsure if related to weather. BriConstance Hawe performed through partial ROM due to weakness and muscle tightness. Plan to perform another evaluation of the back next session as pt obtained another back referral from  another doctor. Will transition to treatment of back. Patient would benefit from continued skilled therapy to address posture deficits in order to improve functional mobility and participation in community

## 2022-04-03 ENCOUNTER — Other Ambulatory Visit: Payer: Medicare HMO

## 2022-04-03 VITALS — BP 142/58 | HR 71 | Temp 97.4°F | Resp 20

## 2022-04-03 DIAGNOSIS — Z515 Encounter for palliative care: Secondary | ICD-10-CM

## 2022-04-03 NOTE — Progress Notes (Signed)
PATIENT NAME: AKAYSHA COBERN ?DOB: [birth year] ?MRN: [id number] ? ?PRIMARY CARE PROVIDER: Crecencio Mc, MD ? ?RESPONSIBLE PARTY:  ?Acct ID - Guarantor Home Phone Work Phone Relationship Acct Type  ?[id number] DHARA, SCHEPP* [phone]  Self P/F  ?   [address] [place], [place] [address]  ? ? ?PLAN OF CARE and INTERVENTIONS: ?              1.  GOALS OF CARE/ ADVANCE CARE PLANNING:  Remain home under the care of her daughters and private caregivers. ?              2.  PATIENT/CAREGIVER EDUCATION:  Disease progression. ?              4. PERSONAL EMERGENCY PLAN:  Activate 911 for emergencies.  ?              5.  DISEASE STATUS: ? ?Dementia:  Patient is using a rollator with ambulation.  No recent falls.  Still able to manage light house-keeping and fiances with the assistance of her daughter.  Patient does not feel her memory is any worse.  She does not feel she is as "sharp" as she use to be.  Remains continent of bowel and bladder.   Patient has private caregivers in the home Monday-Friday from 8-2.  Daughters are checking in daily.   ? ?Phone call made to daughter [name] to follow up on today's visit.  No new concerns voiced.  [name] endorses memory is not as good as it use to be but patient is managing well with assistance.   Daughter shares she does have experience with dementia as her father was diagnosed  with this and later passed under hospice care.   [name] notes patient is at a different level than her father was.  Family is planning on having patient tested with a private agency in [place] to see if patient should continue with driving.  Daughter advised patient is driving down the street to [facility] for food occasionally on the weekend.   ? ? ?HISTORY OF PRESENT ILLNESS:  88 year old female with Mixed Alzheimer's/Vascular Dementia.  Patient is being followed by Palliative Care every 8-12 weeks and PRN.  ? ?CODE STATUS: Full ?ADVANCED DIRECTIVES: Yes ?MOST FORM: No ?PPS: 50% ? ? ?PHYSICAL EXAM:   ? ?VITALS: ?Today's Vitals  ? 04/03/22 0912  ?BP: (!) 142/58  ?Pulse: 71  ?Resp: 20  ?Temp: (!) 97.4 ?F (36.3 ?C)  ?SpO2: 96%  ?  ?LUNGS: clear to auscultation  ?CARDIAC: Cor RRR}  ?EXTREMITIES: - for edema ?SKIN: Skin color, texture, turgor normal. No rashes or lesions or mobility and turgor normal  ?NEURO: positive for gait problems-using rolling walker ? ? ? ? ? ? ?Lorenza Burton, RN ? ?

## 2022-04-09 ENCOUNTER — Encounter: Payer: Self-pay | Admitting: Physical Therapy

## 2022-04-09 ENCOUNTER — Ambulatory Visit: Payer: Medicare HMO | Admitting: Physical Therapy

## 2022-04-09 DIAGNOSIS — G543 Thoracic root disorders, not elsewhere classified: Secondary | ICD-10-CM

## 2022-04-09 DIAGNOSIS — G8929 Other chronic pain: Secondary | ICD-10-CM | POA: Diagnosis not present

## 2022-04-09 DIAGNOSIS — M545 Low back pain, unspecified: Secondary | ICD-10-CM | POA: Diagnosis not present

## 2022-04-09 DIAGNOSIS — M6281 Muscle weakness (generalized): Secondary | ICD-10-CM | POA: Diagnosis not present

## 2022-04-09 DIAGNOSIS — M25561 Pain in right knee: Secondary | ICD-10-CM | POA: Diagnosis not present

## 2022-04-09 NOTE — Therapy (Signed)
[place] [facility] ?[address] ?[place], [place], [address] ?Phone: [phone]   Fax:  [phone] ? ?Physical Therapy Treatment ? ?Patient Details  ?Name: Emily Wu ?MRN: [id number] ?Date of Birth: 29-May-1931 ?Referring Provider (PT): Dr. Mardelle Matte ? ? ?Encounter Date: 04/09/2022 ? ? ? ?Past Medical History:  ?Diagnosis Date  ? Arthritis   ? "knees, shoulders, chest" (02/03/2014)  ? Bruises easily   ? Cervical cancer ([place]) 1965  ? Concussion 03/11/2020  ? High cholesterol   ? History of kidney stones   ? Hypertension   ? DENIES   ? Tendency toward bleeding easily ([place])   ? ? ?Past Surgical History:  ?Procedure Laterality Date  ? ABDOMINAL HYSTERECTOMY  1965  ? APPENDECTOMY    ? CHOLECYSTECTOMY    ? KNEE ARTHROSCOPY Left   ? [facility]  ? PARTIAL KNEE ARTHROPLASTY Left 01/26/2019  ? Procedure: UNICOMPARTMENTAL KNEE;  Surgeon: Marchia Bond, MD;  Location: [facility];  Service: Orthopedics;  Laterality: Left;  ? PARTIAL KNEE ARTHROPLASTY Right 12/10/2021  ? Procedure: UNICOMPARTMENTAL KNEE;  Surgeon: Marchia Bond, MD;  [facility];  Service: Orthopedics;  Laterality: Right;  ? SHOULDER OPEN ROTATOR CUFF REPAIR Bilateral 2007,  2010  ? [facility]  ? THROAT SURGERY  2014  ? "arthritis hugh knot" (02/03/2014)  ? ? ?There were no vitals filed for this visit. ? ? Subjective Assessment - 04/09/22 0922   ? ? Pertinent History Pt is a 88 year old female presenting with lumbar dysfunciton, familiar to this clinic with recent d/c for partial knee replacement. Reports no pain, but that she has lumbar and thoracic dysfunction d/t scolosis. She reports she wants to be able to be more upright. Ambulates with SPC into clinic, and uses rollator at home. Patient plants and tends to flowers in containers on her porch. Continues to have support of children and care givers. Pt is currently not driving   ? ?  ?  ? ?  ? ? ? ? ? ?OBJECTIVE ? ?MUSCULOSKELETAL: ?Tremor: Normal ?Bulk:  Normal ?Tone: Normal ? ?Observation: ?Posture: lower and upper crossed syndrome ?Increased thoracic kyphosis, decreased lumbar lordosis ?Gait: SPC in R hand, decreased speed, near shuffle, little to no hip ext in terminal stance, maintained above posture throughout gait ? ?Cervical Screen ?AROM: WFL and painless with overpressure in all planes ?Lateral bend L=17d R 19d ?Cervical rotation 45d bilat ? ?Hip Screen ?Active hip ext in prone minimal; passive approx 10d bilat, no hip ext throughout gait ?Hip IR 50% limited bilat ? ?Shoulder Screen ?120 bilat Shoulder flexion ?120 true Shoulder abduction able to touch thumbs overhead with scapular pronation compensation  ?Apleys ER C5 bilat ?Apleys IR L= T12; R L2 ?Scapular retraction 25% limited; scapular pronation (excessive), elevation/depression WNL ? ?AROM ?Lumbar forward flexion (65): WNL ?Thoracic flexion: WNL ?Lumbar extension (30): 50% limited ?Thoracic extension to neutral only ?Lumbar lateral flexion (25): 25% limited bilat ?Thoracic and Lumbar rotation (30 degrees):  approx 50% limited bilat, encouragement needed to complete further rotation to R ? ?Muscle Length ?Hamstring L ~10d lag R: ~20d lag ?Thomas shortened bilat ? ?Palpation ?Trigger points with TTP1 to bilat romboids/scap retractors, levator scapulae. Tension throughout all cervical/thoracic/lumbar paraspinals ? ?Strength ?R/L ?Gross bilat shoulder Mmt 4+/5 in available range ?Scapular pronation/retraction/elevation 5/5 bilat ?2+/5 bilat Hip ext ?3-/5 bilat Hip abd ?4-/4 Hip ER ?4-/4 Hip IR  ? ?NEUROLOGICAL: ?Sensation ?Grossly intact to light touch bilateral UE as determined by testing dermatomes  C2-T2 ?Proprioception and hot/cold testing deferred on this date ? ? ?Functional Movement  ?STS transfer with gowers sign, heavy forward trunk lean  ?5xSTS 18sec ?10MWT 0.54ms ? ? ? ?Ther-Ex ?PT reviewed the following HEP with patient with patient able to demonstrate a set of the following with min cuing for  correction needed. PT educated patient on parameters of therex (how/when to inc/decrease intensity, frequency, rep/set range, stretch hold time, and purpose of therex) with verbalized understanding.  ? ?Access Code: [email or web address] ?- Prone on Elbows Stretch  - 2-3 x daily - 7 x weekly - 1-2107m hold ?- Modified Thomas Stretch  - 2-3 x daily - 7 x weekly - 30sec hold ?- Seated Thoracic Lumbar Extension with Pectoralis Stretch  - 2-3 x daily - 7 x weekly - 6-12 reps ?- Standing Anatomical Position with Scapular Retraction and Depression at Wall  - 2-3 x daily - 7 x weekly - 6-12 reps ? ? ? ? ? ? ? ? ? ? ? ? ? ? ? ? ? ? ? ? ? ? ? ? ? ? ? ? ? ? ? ? PT Short Term Goals - 03/19/22 1231   ? ?  ? PT SHORT TERM GOAL #1  ? Title pnt will decrease max NPS number from 8/10 to 6/10 to show clinically significant decrease in pain.   ? Baseline 12/24/21 8/10   ? Time 4   ? Period Weeks   ? Status Achieved   ? Target Date 01/21/22   ?  ? PT SHORT TERM GOAL #2  ? Title pnt will increase right knee PROM to WNL in order to demonstrate joint integrity needed for normalized gait and transfers   ? Baseline 12/24/21 PROM 94 - 20; 02/14/22: 108-20; 03/19/22: 109-5   ? Time 4   ? Period Weeks   ? Status On-going   ? Target Date 04/16/22   ?  ? PT SHORT TERM GOAL #3  ? Title Patient will demonstrate independence with HEP for strengthening and balance to decrease fall risk   ? Baseline 12/25/21 HEP given; 3/24: not being performed; 03/19/22: not being performed   ? Time 4   ? Period Weeks   ? Status On-going   ? Target Date 04/16/22   ? ?  ?  ? ?  ? ? ? ? PT Long Term Goals - 03/24/22 1249   ? ?  ? PT LONG TERM GOAL #1  ? Title Patient will increase FOTO score to 57 to demonstrate predicted increase in functional mobility to complete ADLs   ? Baseline 12/24/21 29; 02/14/22: 47; 03/19/22: 40   ? Time 8   ? Period Weeks   ? Status On-going   ? Target Date 05/14/22   ?  ? PT LONG TERM GOAL #2  ? Title [name] will decrease 5x sit to stand to 14.8 seconds to meet  clinically significant norms to decrease community fall risk.   ? Baseline 12/24/21 unable, 60sec c hands; 02/14/22: 16sec c hands; 03/19/22: 20 seconds c hands   ? Time 8   ? Period Weeks   ? Status On-going   ? Target Date 05/14/22   ?  ? PT LONG TERM GOAL #3  ? Title [name] will imrove 10 meter walk test with 4WW to 0.8 m/s to show limited household ambulation speed.   ? Baseline 12/24/21 .34 m/s; 3/24: on 20077ft 0.83m26m 4WW; 03/19/22: 0.12m/60m 4WW  7MCTime 8   ?  Period Weeks   ? Status Achieved   ? Target Date 03/18/22   ?  ? PT LONG TERM GOAL #4  ? Title [name] will increase gross R knee, and bilat hip abd MMT to 4/5 to increase strength required for functional ADLs   ? Baseline 12/24/21 right flexion and extension: 3-/5; bilat hip abd 3/5; 03/19/22: R knee ext 4+, flexion 4 and bilateral hip abd 4   ? Time 8   ? Status Achieved   ? Target Date 03/18/22   ?  ? PT LONG TERM GOAL #5  ? Title Patient will demonstrate R knee AROM of at least 110-10d to demonstrate symmetrical mobility needed for modI with gait and basic transfers   ? Baseline 12/24/21 91-25d; 02/14/22: 108-20; 03/19/22: 102-5   ? Time 8   ? Period Weeks   ? Status On-going   ? Target Date 05/14/22   ?  ? Additional Long Term Goals  ? Additional Long Term Goals Yes   ?  ? PT LONG TERM GOAL #6  ? Title Pt will decrease mODI score by at least 13 points in order demonstrate clinically significant reduction in back pain/disability.   ? Baseline 03/24/22: 16/50 (32%)   ? Time 8   ? Period Weeks   ? Status New   ? Target Date 05/14/22   ? ?  ?  ? ?  ? ? ? ? ? ? ? ? ? ?Patient will benefit from skilled therapeutic intervention in order to improve the following deficits and impairments:    ? ?Visit Diagnosis: ?No diagnosis found. ? ? ? ? ?Problem List ?Patient Active Problem List  ? Diagnosis Date Noted  ? Anemia 01/04/2022  ? S/P right unicompartmental knee replacement 12/10/2021  ? Bronchitis 10/31/2021  ? Other fatigue 10/22/2021  ? Exocrine pancreatic insufficiency  10/22/2021  ? Encounter for preoperative assessment 09/11/2021  ? Aortic atherosclerosis ([place]) 09/11/2021  ? Benign positional vertigo, right 09/13/2020  ? Mixed Alzheimer's and vascular dementia ([place]) 09/13/2020

## 2022-04-18 ENCOUNTER — Ambulatory Visit: Payer: Medicare HMO | Admitting: Physical Therapy

## 2022-04-24 ENCOUNTER — Encounter: Payer: Self-pay | Admitting: Physical Therapy

## 2022-04-24 ENCOUNTER — Ambulatory Visit: Payer: Medicare HMO | Attending: Physical Medicine & Rehabilitation | Admitting: Physical Therapy

## 2022-04-24 DIAGNOSIS — G543 Thoracic root disorders, not elsewhere classified: Secondary | ICD-10-CM | POA: Insufficient documentation

## 2022-04-24 NOTE — Therapy (Signed)
[place] [facility] [address] [place], [address] Phone: [phone]   Fax:  [phone]  Physical Therapy Treatment  Patient Details  Name: Emily Wu MRN: [id number] Date of Birth: 1931-07-03 Referring Provider (PT): Dr. Mardelle Matte   Encounter Date: 04/24/2022   PT End of Session - 04/24/22 0939     Visit Number 2    Number of Visits 17    Date for PT Re-Evaluation 06/06/22    Authorization Type [employer]    Authorization - Visit Number 2    Authorization - Number of Visits 10    Progress Note Due on Visit 10    PT Start Time 0916    PT Stop Time 0955    PT Time Calculation (min) 39 min    Activity Tolerance Patient tolerated treatment well    Behavior During Therapy [facility] for tasks assessed/performed             Past Medical History:  Diagnosis Date   Arthritis    "knees, shoulders, chest" (02/03/2014)   Bruises easily    Cervical cancer [facility]) 1965   Concussion 03/11/2020   High cholesterol    History of kidney stones    Hypertension    DENIES    Tendency toward bleeding easily [facility])     Past Surgical History:  Procedure Laterality Date   [place] ARTHROSCOPY Left    [place]   PARTIAL KNEE ARTHROPLASTY Left 01/26/2019   Procedure: UNICOMPARTMENTAL KNEE;  Surgeon: Marchia Bond, MD;  Location: [place];  Service: Orthopedics;  Laterality: Left;   PARTIAL KNEE ARTHROPLASTY Right 12/10/2021   Procedure: UNICOMPARTMENTAL KNEE;  Surgeon: Marchia Bond, MD;  Location[facility];  Service: Orthopedics;  Laterality: Right;   SHOULDER OPEN ROTATOR CUFF REPAIR Bilateral 2007,  2010   [place]   THROAT SURGERY  2014   "arthritis hugh knot" (02/03/2014)    There were no vitals filed for this visit.   Subjective Assessment - 04/24/22 0923     Subjective Pt arrives with back brace she would like advisement on. She cleaned out her garage with  her family Saturday and reports she was in bed all day sunday. 2/10 LBP today.    Pertinent History Pt is a 88 year old female presenting with lumbar dysfunciton, familiar to this clinic with recent d/c for partial knee replacement. Reports no pain, but that she has lumbar and thoracic dysfunction d/t scolosis. She reports she wants to be able to be more upright. Ambulates with SPC into clinic, and uses rollator at home. Patient plants and tends to flowers in containers on her porch. Continues to have support of children and care givers. Pt is currently not driving. ?Pt denies N/V, B&B changes, unexplained weight fluctuation, saddle paresthesia, fever, night sweats, or unrelenting night pain at this time.    Limitations Lifting;Walking;House hold activities;Standing    How long can you sit comfortably? unlimited    How long can you stand comfortably? Reports she cannot stand without support; with walker ~428mns    How long can you walk comfortably? 2033f   Patient Stated Goals Being more upright    Pain Onset More than a month ago              Ther-Ex Nustep seat 7 UE 12 L2 28m60m for gentle rotation and strengthening Prone on elbows stretch 43sec;  72mn Supine hip flex stretch LE ext/CLE flex x30sec bilat > modified thomas 30sec bilat Standing at wall scap retractions x12 Bilat ER YTB 2x 12  with min cuing for posture and eccentric control with good carry over Thoracic/lumbar ext over towel roll with hands behind head x12 with cuing for breath control   Amb 525fno AD with cuing of posture from therex carry over with good carry over for short distance quick fatigue  Education on strengthening vs. Accommodating with bracing                PT Education - 04/24/22 0933     Education Details therex form/technqiue, HEP review    Person(s) Educated Patient    Methods Explanation;Demonstration;Verbal cues    Comprehension Verbalized understanding;Returned demonstration;Verbal  cues required              PT Short Term Goals - 04/09/22 1458       PT SHORT TERM GOAL #1   Title Pt will be independent with HEP in order to improve strength and mobility in order to improve function at home    Baseline 04/09/22 HEP given    Time 4    Period Weeks    Status New               PT Long Term Goals - 04/09/22 1459       PT LONG TERM GOAL #1   Title Pt will decrease 5TSTS by at least 3 seconds in order to demonstrate clinically significant improvement in LE strength    Baseline 04/09/22 18sec    Time 8    Period Weeks    Status New    Target Date 06/06/22      PT LONG TERM GOAL #2   Title Pt will increase 10MWT to at least 0.80 m/s in order to demonstrate safe speed for household ambulation    Baseline 04/09/22 0.4420m   Time 8    Period Weeks    Status New    Target Date 06/06/22      PT LONG TERM GOAL #3   Title Patient will increase FOTO score to 53to demonstrate predicted increase in functional mobility to complete ADLs    Baseline 04/09/22 38    Time 8    Period Weeks    Status New    Target Date 06/06/22      PT LONG TERM GOAL #4   Title Pt will demonstrate neutral standing posture for 2 mins in order to complete self care ADLs    Baseline 04/09/22 able to hold for a couple seconds    Time 8    Period Weeks    Status New    Target Date 06/06/22                   Plan - 04/24/22 1017     Clinical Impression Statement PT initiated therex progression for increased spine mobility and postural strengthening with success. Patient is able to comply with all cuing of proper technique of therex with excellent motivation throughout session. Patiet is able to demonstrate carry over of therex into short distance ambulation with quick fatigue. PT assessed brace patient brought in and educated patient on bracing for comfort intermittently, but that the long term goal is to strengthening posterior hip and back musculature in order to be upright  > brace accommodation with understanding. PT will continue progressiona s able.    Personal Factors and Comorbidities Age;Fitness;Past/Current Experience;Comorbidity  3+;Time since onset of injury/illness/exacerbation    Comorbidities HLD, OA multiple joints, GERD, CKD    Examination-Activity Limitations Bathing;Carry;Lift;Stand;Bed Mobility;Locomotion Level;Bend;Squat;Hygiene/Grooming;Stairs    Examination-Participation Restrictions Cleaning;Community Activity;Dorita Sciara    Stability/Clinical Decision Making Evolving/Moderate complexity    Clinical Decision Making Moderate    Rehab Potential Fair    PT Frequency 2x / week    PT Duration 8 weeks    PT Treatment/Interventions ADLs/Self Care Home Management;Aquatic Therapy;Cryotherapy;Electrical Stimulation;Biofeedback;Iontophoresis '4mg'$ /ml Dexamethasone;Moist Heat;Traction;Ultrasound;Parrafin;Fluidtherapy;Contrast Bath;Gait training;Stair training;Functional mobility training;Therapeutic activities;Therapeutic exercise;Balance training;Neuromuscular re-education;Patient/family education;Manual techniques;Compression bandaging;Passive range of motion;Dry needling;Energy conservation;Spinal Manipulations;Joint Manipulations;DME Instruction    PT Next Visit Plan HEP review, posture training, scoliosis curve assessment    PT Home Exercise Plan prone on elbow, thomas stretch, seated thoracic lumbar ext w/ pec stretch, scap retraction on wall.    Consulted and Agree with Plan of Care Patient             Patient will benefit from skilled therapeutic intervention in order to improve the following deficits and impairments:  Abnormal gait, Decreased activity tolerance, Decreased endurance, Decreased range of motion, Decreased strength, Hypomobility, Increased fascial restricitons, Improper body mechanics, Pain, Decreased balance, Decreased coordination, Decreased mobility, Difficulty walking, Postural dysfunction, Impaired flexibility, Impaired tone,  Impaired UE functional use  Visit Diagnosis: Thoracic root disorders, not elsewhere classified     Problem List Patient Active Problem List   Diagnosis Date Noted   Anemia 01/04/2022   S/P right unicompartmental knee replacement 12/10/2021   Bronchitis 10/31/2021   Other fatigue 10/22/2021   Exocrine pancreatic insufficiency 10/22/2021   Encounter for preoperative assessment 09/11/2021   Aortic atherosclerosis ([place]) 09/11/2021   Benign positional vertigo, right 09/13/2020   Mixed Alzheimer's and vascular dementia ([place]) 09/13/2020   Generalized muscle weakness 09/13/2020   Pulmonary nodule less than 1 cm in diameter with low risk for malignant neoplasm 07/26/2020   Depression 07/26/2020   B12 deficiency 07/26/2020   Abnormal posture 04/26/2020   Widowed 03/11/2020   Osteoarthritis of right knee 08/26/2019   Orthostatic dizziness 08/18/2019   S/P left unicompartmental knee replacement 01/26/2019   Hospital discharge follow-up 12/26/2018   Incidental pulmonary nodule, > 52m and < 875m02/12/2018   Chronic kidney disease (CKD), stage III (moderate) 12/26/2018   Left bundle branch block (LBBB) determined by electrocardiography 12/02/2018   Encounter for pre-operative cardiovascular clearance 12/02/2018   GERD (gastroesophageal reflux disease) 09/29/2018   Anxiety disorder 03/25/2018   Diarrhea due to malabsorption 04/18/2017   Primary osteoarthritis of right knee 10/13/2016   Chronic pain of right knee 05/27/2016   Knee pain, bilateral 05/27/2016   Exertional dyspnea 04/15/2016   Left carotid bruit 04/14/2016   Menopausal hot flushes 08/07/2015   H/O malignant neoplasm of skin 06/25/2015   Hypothyroidism 01/31/2015   History of cervical cancer 01/10/2015   Hyperlipidemia 02/03/2014   Medicare annual wellness visit, subsequent 01/19/2014   History of IBS 01/19/2014   S/P breast augmentation 01/19/2014   Primary osteoarthritis of left knee 07/06/2012   Colon polyps  01/30/2012   Low back pain 01/30/2012   ChDurwin RegesPT ChDurwin RegesPT 04/24/2022, 11:43 AM  [facility] [address]one: [phone] Fax:  [phone]: Emily ALKHATIBRN: [id number] of Birth: 6/Apr 13, 1931

## 2022-04-29 ENCOUNTER — Encounter: Payer: Medicare HMO | Admitting: Physical Therapy

## 2022-05-01 ENCOUNTER — Encounter: Payer: Medicare HMO | Admitting: Physical Therapy

## 2022-05-06 ENCOUNTER — Ambulatory Visit: Payer: Medicare HMO | Admitting: Physical Therapy

## 2022-05-08 ENCOUNTER — Ambulatory Visit: Payer: Medicare HMO | Admitting: Physical Therapy

## 2022-05-08 ENCOUNTER — Encounter: Payer: Self-pay | Admitting: Physical Therapy

## 2022-05-08 DIAGNOSIS — G543 Thoracic root disorders, not elsewhere classified: Secondary | ICD-10-CM | POA: Diagnosis not present

## 2022-05-08 NOTE — Therapy (Signed)
[place] [facility] [address], [place], [address] Phone: [phone]   Fax:  [phone]  Physical Therapy Treatment  Patient Details  Name: Emily Wu MRN: [id number] Date of Birth: [birth year] Referring Provider (PT): Dr. Mardelle Matte   Encounter Date: 05/08/2022   PT End of Session - 05/08/22 1312     Visit Number 3    Number of Visits 17    Date for PT Re-Evaluation 06/06/22    Authorization Type [employer]    Authorization - Visit Number 3    Authorization - Number of Visits 20    Progress Note Due on Visit 10    PT Start Time 1302    PT Stop Time 1340    PT Time Calculation (min) 38 min    Activity Tolerance Patient tolerated treatment well    Behavior During Therapy [facility] for tasks assessed/performed             Past Medical History:  Diagnosis Date   Arthritis    "knees, shoulders, chest" (02/03/2014)   Bruises easily    Cervical cancer ([place]) 1965   Concussion 03/11/2020   High cholesterol    History of kidney stones    Hypertension    DENIES    Tendency toward bleeding easily [facility])     Past Surgical History:  Procedure Laterality Date   [place] ARTHROSCOPY Left    [place]   PARTIAL KNEE ARTHROPLASTY Left 01/26/2019   Procedure: UNICOMPARTMENTAL KNEE;  Surgeon: Marchia Bond, MD;  Location: [place];  Service: Orthopedics;  Laterality: Left;   PARTIAL KNEE ARTHROPLASTY Right 12/10/2021   Procedure: UNICOMPARTMENTAL KNEE;  Surgeon: Marchia Bond, MD;  [facility];  Service: Orthopedics;  Laterality: Right;   SHOULDER OPEN ROTATOR CUFF REPAIR Bilateral 2007,  2010   [place]   THROAT SURGERY  2014   "arthritis hugh knot" (02/03/2014)    There were no vitals filed for this visit.   Subjective Assessment - 05/08/22 1300     Subjective Pt reports her back is feeling pretty good, no pain today.    Pertinent History Pt is  a 88 year old female presenting with lumbar dysfunciton, familiar to this clinic with recent d/c for partial knee replacement. Reports no pain, but that she has lumbar and thoracic dysfunction d/t scolosis. She reports she wants to be able to be more upright. Ambulates with SPC into clinic, and uses rollator at home. Patient plants and tends to flowers in containers on her porch. Continues to have support of children and care givers. Pt is currently not driving. ?Pt denies N/V, B&B changes, unexplained weight fluctuation, saddle paresthesia, fever, night sweats, or unrelenting night pain at this time.    Limitations Lifting;Walking;House hold activities;Standing    How long can you sit comfortably? unlimited    How long can you stand comfortably? Reports she cannot stand without support; with walker ~56mns    How long can you walk comfortably? 2065f   Patient Stated Goals Being more upright    Pain Onset More than a month ago              Ther-Ex Nustep seat 7 UE 12 L2 42m38m for gentle rotation and strengthening Standing against wall attempt to anatomical position x12  (feet approx 6in from wall)  Prone on elbows stretch 1mi67mrone press up x12 with  cuing for technique with good carry over  Alt hip ext prone x8; 1 with max cuing to prevent rotation compensation with some carry over, very minimal lift  Bilat ER YTB 2x 12  with min cuing for posture and eccentric control with good carry over  STS 2x 6 with focus on full stand at top with excellent carry over modified thomas stretch 30sec + 10sec overpressure bilat                           PT Education - 05/08/22 1309     Education Details therex form/technique    Person(s) Educated Patient    Methods Explanation;Demonstration;Tactile cues    Comprehension Verbalized understanding;Returned demonstration;Verbal cues required              PT Short Term Goals - 04/09/22 1458       PT SHORT TERM GOAL  #1   Title Pt will be independent with HEP in order to improve strength and mobility in order to improve function at home    Baseline 04/09/22 HEP given    Time 4    Period Weeks    Status New               PT Long Term Goals - 04/09/22 1459       PT LONG TERM GOAL #1   Title Pt will decrease 5TSTS by at least 3 seconds in order to demonstrate clinically significant improvement in LE strength    Baseline 04/09/22 18sec    Time 8    Period Weeks    Status New    Target Date 06/06/22      PT LONG TERM GOAL #2   Title Pt will increase 10MWT to at least 0.80 m/s in order to demonstrate safe speed for household ambulation    Baseline 04/09/22 0.66ms    Time 8    Period Weeks    Status New    Target Date 06/06/22      PT LONG TERM GOAL #3   Title Patient will increase FOTO score to 53to demonstrate predicted increase in functional mobility to complete ADLs    Baseline 04/09/22 38    Time 8    Period Weeks    Status New    Target Date 06/06/22      PT LONG TERM GOAL #4   Title Pt will demonstrate neutral standing posture for 2 mins in order to complete self care ADLs    Baseline 04/09/22 able to hold for a couple seconds    Time 8    Period Weeks    Status New    Target Date 06/06/22                   Plan - 05/08/22 1339     Clinical Impression Statement PT continued therex progression for increased extensor strengthening and tolerance with success. Paitent is able to comply with all cuing for proper tehnique of therex with noted muscle fatigue; appropriate rest breaks given. Pt motivated throughout session with increased upright posture following session. PT will continue progression as able.    Personal Factors and Comorbidities Age;Fitness;Past/Current Experience;Comorbidity 3+;Time since onset of injury/illness/exacerbation    Comorbidities HLD, OA multiple joints, GERD, CKD    Examination-Activity Limitations Bathing;Carry;Lift;Stand;Bed Mobility;Locomotion  Level;Bend;Squat;Hygiene/Grooming;Stairs    Examination-Participation Restrictions Cleaning;Community Activity;YV[facility] LeaverWGroup [facility]   Stability/Clinical Decision Making Evolving/Moderate complexity    Clinical Decision Making Moderate  Rehab Potential Fair    PT Frequency 2x / week    PT Duration 8 weeks    PT Treatment/Interventions ADLs/Self Care Home Management;Aquatic Therapy;Cryotherapy;Electrical Stimulation;Biofeedback;Iontophoresis '4mg'$ /ml Dexamethasone;Moist Heat;Traction;Ultrasound;Parrafin;Fluidtherapy;Contrast Bath;Gait training;Stair training;Functional mobility training;Therapeutic activities;Therapeutic exercise;Balance training;Neuromuscular re-education;Patient/family education;Manual techniques;Compression bandaging;Passive range of motion;Dry needling;Energy conservation;Spinal Manipulations;Joint Manipulations;DME Instruction    PT Next Visit Plan HEP review, posture training, scoliosis curve assessment    PT Home Exercise Plan prone on elbow, thomas stretch, seated thoracic lumbar ext w/ pec stretch, scap retraction on wall.    Consulted and Agree with Plan of Care Patient             Patient will benefit from skilled therapeutic intervention in order to improve the following deficits and impairments:  Abnormal gait, Decreased activity tolerance, Decreased endurance, Decreased range of motion, Decreased strength, Hypomobility, Increased fascial restricitons, Improper body mechanics, Pain, Decreased balance, Decreased coordination, Decreased mobility, Difficulty walking, Postural dysfunction, Impaired flexibility, Impaired tone, Impaired UE functional use  Visit Diagnosis: Thoracic root disorders, not elsewhere classified     Problem List Patient Active Problem List   Diagnosis Date Noted   Anemia 01/04/2022   S/P right unicompartmental knee replacement 12/10/2021   Bronchitis 10/31/2021   Other fatigue 10/22/2021   Exocrine pancreatic insufficiency 10/22/2021    Encounter for preoperative assessment 09/11/2021   Aortic atherosclerosis ([place]) 09/11/2021   Benign positional vertigo, right 09/13/2020   Mixed Alzheimer's and vascular dementia ([place]) 09/13/2020   Generalized muscle weakness 09/13/2020   Pulmonary nodule less than 1 cm in diameter with low risk for malignant neoplasm 07/26/2020   Depression 07/26/2020   B12 deficiency 07/26/2020   Abnormal posture 04/26/2020   Widowed 03/11/2020   Osteoarthritis of right knee 08/26/2019   Orthostatic dizziness 08/18/2019   S/P left unicompartmental knee replacement 01/26/2019   Hospital discharge follow-up 12/26/2018   Incidental pulmonary nodule, > 74m and < 860m02/12/2018   Chronic kidney disease (CKD), stage III (moderate) 12/26/2018   Left bundle branch block (LBBB) determined by electrocardiography 12/02/2018   Encounter for pre-operative cardiovascular clearance 12/02/2018   GERD (gastroesophageal reflux disease) 09/29/2018   Anxiety disorder 03/25/2018   Diarrhea due to malabsorption 04/18/2017   Primary osteoarthritis of right knee 10/13/2016   Chronic pain of right knee 05/27/2016   Knee pain, bilateral 05/27/2016   Exertional dyspnea 04/15/2016   Left carotid bruit 04/14/2016   Menopausal hot flushes 08/07/2015   H/O malignant neoplasm of skin 06/25/2015   Hypothyroidism 01/31/2015   History of cervical cancer 01/10/2015   Hyperlipidemia 02/03/2014   Medicare annual wellness visit, subsequent 01/19/2014   History of IBS 01/19/2014   S/P breast augmentation 01/19/2014   Primary osteoarthritis of left knee 07/06/2012   Colon polyps 01/30/2012   Low back pain 01/30/2012   ChDurwin RegesPT ChDurwin RegesPT 05/08/2022, 1:44 PM  [facility] [id number]: [phone] Fax:  [phone]: Emily SUTPHENRN: [id number] of Birth: 04/1931/01/07

## 2022-05-13 ENCOUNTER — Ambulatory Visit: Payer: Medicare HMO | Admitting: Physical Therapy

## 2022-05-15 ENCOUNTER — Ambulatory Visit: Payer: Medicare HMO | Admitting: Physical Therapy

## 2022-05-20 ENCOUNTER — Ambulatory Visit: Payer: Medicare HMO | Admitting: Physical Therapy

## 2022-05-20 DIAGNOSIS — G543 Thoracic root disorders, not elsewhere classified: Secondary | ICD-10-CM

## 2022-05-22 ENCOUNTER — Ambulatory Visit: Payer: Medicare HMO | Admitting: Physical Therapy

## 2022-05-28 ENCOUNTER — Other Ambulatory Visit: Payer: Self-pay | Admitting: Internal Medicine

## 2022-05-29 ENCOUNTER — Encounter: Payer: Self-pay | Admitting: Physical Therapy

## 2022-05-29 ENCOUNTER — Ambulatory Visit: Payer: Medicare HMO | Attending: Physical Medicine & Rehabilitation | Admitting: Physical Therapy

## 2022-05-29 DIAGNOSIS — M25561 Pain in right knee: Secondary | ICD-10-CM | POA: Insufficient documentation

## 2022-05-29 DIAGNOSIS — G543 Thoracic root disorders, not elsewhere classified: Secondary | ICD-10-CM | POA: Diagnosis not present

## 2022-05-29 NOTE — Therapy (Signed)
[facility] [address] [place], [address] Phone: [phone]   Fax:  [phone]  Physical Therapy Treatment  Patient Details  Name: Emily Wu MRN: [id number] Date of Birth: 12-15-1930 Referring Provider (PT): Dr. Mardelle Matte   Encounter Date: 05/29/2022    Past Medical History:  Diagnosis Date   Arthritis    "knees, shoulders, chest" (02/03/2014)   Bruises easily    Cervical cancer [facility]) 1965   Concussion 03/11/2020   High cholesterol    History of kidney stones    Hypertension    DENIES    Tendency toward bleeding easily [facility])     Past Surgical History:  Procedure Laterality Date   [place] ARTHROSCOPY Left    [place]   PARTIAL KNEE ARTHROPLASTY Left 01/26/2019   Procedure: UNICOMPARTMENTAL KNEE;  Surgeon: Marchia Bond, MD;  Location: [facility];  Service: Orthopedics;  Laterality: Left;   PARTIAL KNEE ARTHROPLASTY Right 12/10/2021   Procedure: UNICOMPARTMENTAL KNEE;  Surgeon: Marchia Bond, MD;  Location: [facility];  Service: Orthopedics;  Laterality: Right;   SHOULDER OPEN ROTATOR CUFF REPAIR Bilateral 2007,  2010   [place]   THROAT SURGERY  2014   "arthritis hugh knot" (02/03/2014)    There were no vitals filed for this visit.   Ther-Ex Nustep seat 7 UE 12 L2 56mns for gentle rotation and strengthening Standing against wall attempt to anatomical position x12  (feet approx 6in from wall)   Prone on elbows stretch 161m Verbal review review of thomas stretch and parameters of these (daily 30--60sec, pt reports "I have not been holding it that long)  Prone alt hip ext x10 with minimal lift STS with cane across back 2x 8 with cuing for fullest stand at top Modified birddog at wall x8- patient chooses to cease exercise and says she will do at home, is tired currently  Bilat ER YTB 2x 10 with max cuing initially (completing horizontal abd  initially) cuing following for eccentric control   Thoracic ext over towel roll x12 with cuing for technique and reath control- "I am not completing this one at home"                             PT Short Term Goals - 04/09/22 1458       PT SHORT TERM GOAL #1   Title Pt will be independent with HEP in order to improve strength and mobility in order to improve function at home    Baseline 04/09/22 HEP given    Time 4    Period Weeks    Status New               PT Long Term Goals - 04/09/22 1459       PT LONG TERM GOAL #1   Title Pt will decrease 5TSTS by at least 3 seconds in order to demonstrate clinically significant improvement in LE strength    Baseline 04/09/22 18sec    Time 8    Period Weeks    Status New    Target Date 06/06/22      PT LONG TERM GOAL #2   Title Pt will increase 10MWT to at least 0.80 m/s in order to demonstrate safe speed for household ambulation    Baseline 04/09/22 0.4483m   Time 8  Period Weeks    Status New    Target Date 06/06/22      PT LONG TERM GOAL #3   Title Patient will increase FOTO score to 53to demonstrate predicted increase in functional mobility to complete ADLs    Baseline 04/09/22 38    Time 8    Period Weeks    Status New    Target Date 06/06/22      PT LONG TERM GOAL #4   Title Pt will demonstrate neutral standing posture for 2 mins in order to complete self care ADLs    Baseline 04/09/22 able to hold for a couple seconds    Time 8    Period Weeks    Status New    Target Date 06/06/22                    Patient will benefit from skilled therapeutic intervention in order to improve the following deficits and impairments:     Visit Diagnosis: No diagnosis found.     Problem List Patient Active Problem List   Diagnosis Date Noted   Anemia 01/04/2022   S/P right unicompartmental knee replacement 12/10/2021   Bronchitis 10/31/2021   Other fatigue 10/22/2021   Exocrine  pancreatic insufficiency 10/22/2021   Encounter for preoperative assessment 09/11/2021   Aortic atherosclerosis ([place]) 09/11/2021   Benign positional vertigo, right 09/13/2020   Mixed Alzheimer's and vascular dementia ([place]) 09/13/2020   Generalized muscle weakness 09/13/2020   Pulmonary nodule less than 1 cm in diameter with low risk for malignant neoplasm 07/26/2020   Depression 07/26/2020   B12 deficiency 07/26/2020   Abnormal posture 04/26/2020   Widowed 03/11/2020   Osteoarthritis of right knee 08/26/2019   Orthostatic dizziness 08/18/2019   S/P left unicompartmental knee replacement 01/26/2019   Hospital discharge follow-up 12/26/2018   Incidental pulmonary nodule, > 24m and < 868m02/12/2018   Chronic kidney disease (CKD), stage III (moderate) 12/26/2018   Left bundle branch block (LBBB) determined by electrocardiography 12/02/2018   Encounter for pre-operative cardiovascular clearance 12/02/2018   GERD (gastroesophageal reflux disease) 09/29/2018   Anxiety disorder 03/25/2018   Diarrhea due to malabsorption 04/18/2017   Primary osteoarthritis of right knee 10/13/2016   Chronic pain of right knee 05/27/2016   Knee pain, bilateral 05/27/2016   Exertional dyspnea 04/15/2016   Left carotid bruit 04/14/2016   Menopausal hot flushes 08/07/2015   H/O malignant neoplasm of skin 06/25/2015   Hypothyroidism 01/31/2015   History of cervical cancer 01/10/2015   Hyperlipidemia 02/03/2014   Medicare annual wellness visit, subsequent 01/19/2014   History of IBS 01/19/2014   S/P breast augmentation 01/19/2014   Primary osteoarthritis of left knee 07/06/2012   Colon polyps 01/30/2012   Low back pain 01/30/2012   ChDurwin RegesPT ChDurwin RegesPT 05/29/2022, 10:50 AM  [facility] [address]one: [phone] Fax:  [phone]: Emily KRUKOWSKIRN: [id number] of  Birth: 6/06-05-1931

## 2022-06-03 ENCOUNTER — Encounter: Payer: Medicare HMO | Admitting: Physical Therapy

## 2022-06-04 ENCOUNTER — Encounter: Payer: Self-pay | Admitting: Physical Therapy

## 2022-06-04 ENCOUNTER — Ambulatory Visit: Payer: Medicare HMO | Admitting: Physical Therapy

## 2022-06-04 DIAGNOSIS — G543 Thoracic root disorders, not elsewhere classified: Secondary | ICD-10-CM

## 2022-06-04 DIAGNOSIS — M25561 Pain in right knee: Secondary | ICD-10-CM | POA: Diagnosis not present

## 2022-06-04 NOTE — Therapy (Signed)
[facility] [address], [place], [address] Phone: [phone]   Fax:  [phone]  Physical Therapy Treatment/DC Summary 04/09/22 - 06/04/22  Patient Details  Name: Emily Wu MRN: [id number] Date of Birth: 1931/01/25 Referring Provider (PT): Dr. Mardelle Matte   Encounter Date: 06/04/2022   PT End of Session - 06/04/22 1049     Visit Number 6    Number of Visits 17    Date for PT Re-Evaluation 06/06/22    Authorization Type [employer]    Authorization Time Period cert 1/70/01-7/49/44; Authorization 12/26/21-02/28/22- 18 visits    Authorization - Visit Number 6    Authorization - Number of Visits 20    Progress Note Due on Visit 10    PT Start Time 9675    PT Stop Time 1124    PT Time Calculation (min) 39 min    Equipment Utilized During Treatment Gait belt    Activity Tolerance Patient tolerated treatment well    Behavior During Therapy WFL for tasks assessed/performed             Past Medical History:  Diagnosis Date   Arthritis    "knees, shoulders, chest" (02/03/2014)   Bruises easily    Cervical cancer ([place]) 1965   Concussion 03/11/2020   High cholesterol    History of kidney stones    Hypertension    DENIES    Tendency toward bleeding easily [facility])     Past Surgical History:  Procedure Laterality Date   ABDOMINAL HYSTERECTOMY  1965   APPENDECTOMY     CHOLECYSTECTOMY     KNEE ARTHROSCOPY Left    [place]   PARTIAL KNEE ARTHROPLASTY Left 01/26/2019   Procedure: UNICOMPARTMENTAL KNEE;  Surgeon: Marchia Bond, MD;  Location: [facility];  Service: Orthopedics;  Laterality: Left;   PARTIAL KNEE ARTHROPLASTY Right 12/10/2021   Procedure: UNICOMPARTMENTAL KNEE;  Surgeon: Marchia Bond, MD;  [facility];  Service: Orthopedics;  Laterality: Right;   SHOULDER OPEN ROTATOR CUFF REPAIR Bilateral 2007,  2010   [place]   THROAT SURGERY  2014   "arthritis hugh knot" (02/03/2014)    There were no  vitals filed for this visit.   Subjective Assessment - 06/04/22 1051     Subjective Reports she doesn't see much difference in her back, would like to be more "upright". Denies pain, reports she would like to see more anatomical change in her spine.    Pertinent History Pt is a 88 year old female presenting with lumbar dysfunciton, familiar to this clinic with recent d/c for partial knee replacement. Reports no pain, but that she has lumbar and thoracic dysfunction d/t scolosis. She reports she wants to be able to be more upright. Ambulates with SPC into clinic, and uses rollator at home. Patient plants and tends to flowers in containers on her porch. Continues to have support of children and care givers. Pt is currently not driving. ?Pt denies N/V, B&B changes, unexplained weight fluctuation, saddle paresthesia, fever, night sweats, or unrelenting night pain at this time.    Limitations Lifting;Walking;House hold activities;Standing    How long can you sit comfortably? unlimited    How long can you stand comfortably? Reports she cannot stand without support; with walker ~64mns    How long can you walk comfortably? 2050f   Patient Stated Goals Being more upright    Pain Onset More than a month ago  Ther-Ex Nustep seat 7 UE 12 L2 24mns for gentle rotation and strengthening Standing against wall attempt to anatomical position x12  (feet approx 6in from wall)  5xSTS 2 trials  PT reviewed the following HEP with patient with PT demonstrating  the following with verbal review as well. PT educated patient on parameters of therex (how/when to inc/decrease intensity, frequency, rep/set range, stretch hold time, and purpose of therex) with verbalized understanding.   Education on purpose of therex for improving strength to decrease injury risk and increase ind, as opposed to anatomical changes to postural appearance with understanding  Daily:  Prone on elbows stretch 128m Thomas  stretch 80m59mThoracic ext over towel roll x12    1x/week Prone alt hip ext 3 x10  STS with cane across back 3x 8   Bilat ER YTB 3x 10                                  PT Short Term Goals - 04/09/22 1458       PT SHORT TERM GOAL #1   Title Pt will be independent with HEP in order to improve strength and mobility in order to improve function at home    Baseline 04/09/22 HEP given    Time 4    Period Weeks    Status New               PT Long Term Goals - 06/04/22 1054       PT LONG TERM GOAL #1   Title Pt will decrease 5TSTS by at least 3 seconds in order to demonstrate clinically significant improvement in LE strength    Baseline 04/09/22 18sec; 06/07/22 12sec    Time 8    Period Weeks    Status Achieved      PT LONG TERM GOAL #2   Title Pt will increase 10MWT to at least 0.80 m/s in order to demonstrate safe speed for household ambulation    Baseline 04/09/22 0.62m880m0.880m/60m Time 8    Period Weeks      PT LONG TERM GOAL #3   Title Patient will increase FOTO score to 53to demonstrate predicted increase in functional mobility to complete ADLs    Baseline 04/09/22 38 06/04/22 68    Time 8    Period Weeks    Status Achieved    Target Date 06/06/22      PT LONG TERM GOAL #4   Title Pt will demonstrate neutral standing posture for 2 mins in order to complete self care ADLs    Baseline 04/09/22 able to hold for a couple seconds; 06/04/22 ceased at 2 mins with one hand support with SPC  [place]/12/23 1114     Clinical Impression Statement PT reassessed goals this session where patient has met all set goals to d/c PT. Patient is able to verbalize understandinging of HEP therex and parameters (not wanting to demonstate these). Pt given printout of therex and clinic contact info should further questions or concerns arise. Pt agreeable to d/c PT    Personal Factors and Comorbidities Age;Fitness;Past/Current  Experience;Comorbidity 3+;Time since onset of injury/illness/exacerbation    Comorbidities HLD, OA multiple joints, GERD, CKD    Examination-Activity Limitations Bathing;Carry;Lift;Stand;Bed Mobility;Locomotion Level;Bend;Squat;Hygiene/Grooming;Stairs    Examination-Participation Restrictions Cleaning;Community Activity;[facility] [place] [employer]/Clinical  Decision Making Evolving/Moderate complexity    Clinical Decision Making Moderate    Rehab Potential Fair    PT Frequency 2x / week    PT Duration 8 weeks    PT Treatment/Interventions ADLs/Self Care Home Management;Aquatic Therapy;Cryotherapy;Electrical Stimulation;Biofeedback;Iontophoresis 63m/ml Dexamethasone;Moist Heat;Traction;Ultrasound;Parrafin;Fluidtherapy;Contrast Bath;Gait training;Stair training;Functional mobility training;Therapeutic activities;Therapeutic exercise;Balance training;Neuromuscular re-education;Patient/family education;Manual techniques;Compression bandaging;Passive range of motion;Dry needling;Energy conservation;Spinal Manipulations;Joint Manipulations;DME Instruction    PT Next Visit Plan HEP review, posture training, scoliosis curve assessment    PT Home Exercise Plan prone on elbow, thomas stretch, seated thoracic lumbar ext w/ pec stretch, scap retraction on wall.    Consulted and Agree with Plan of Care Patient             Patient will benefit from skilled therapeutic intervention in order to improve the following deficits and impairments:  Abnormal gait, Decreased activity tolerance, Decreased endurance, Decreased range of motion, Decreased strength, Hypomobility, Increased fascial restricitons, Improper body mechanics, Pain, Decreased balance, Decreased coordination, Decreased mobility, Difficulty walking, Postural dysfunction, Impaired flexibility, Impaired tone, Impaired UE functional use  Visit Diagnosis: Thoracic root disorders, not elsewhere classified     Problem List Patient Active Problem  List   Diagnosis Date Noted   Anemia 01/04/2022   S/P right unicompartmental knee replacement 12/10/2021   Bronchitis 10/31/2021   Other fatigue 10/22/2021   Exocrine pancreatic insufficiency 10/22/2021   Encounter for preoperative assessment 09/11/2021   Aortic atherosclerosis ([place] 09/11/2021   Benign positional vertigo, right 09/13/2020   Mixed Alzheimer's and vascular dementia ([place] 09/13/2020   Generalized muscle weakness 09/13/2020   Pulmonary nodule less than 1 cm in diameter with low risk for malignant neoplasm 07/26/2020   Depression 07/26/2020   B12 deficiency 07/26/2020   Abnormal posture 04/26/2020   Widowed 03/11/2020   Osteoarthritis of right knee 08/26/2019   Orthostatic dizziness 08/18/2019   S/P left unicompartmental knee replacement 01/26/2019   Hospital discharge follow-up 12/26/2018   Incidental pulmonary nodule, > 366mand < 62m36m2/12/2018   Chronic kidney disease (CKD), stage III (moderate) 12/26/2018   Left bundle branch block (LBBB) determined by electrocardiography 12/02/2018   Encounter for pre-operative cardiovascular clearance 12/02/2018   GERD (gastroesophageal reflux disease) 09/29/2018   Anxiety disorder 03/25/2018   Diarrhea due to malabsorption 04/18/2017   Primary osteoarthritis of right knee 10/13/2016   Chronic pain of right knee 05/27/2016   Knee pain, bilateral 05/27/2016   Exertional dyspnea 04/15/2016   Left carotid bruit 04/14/2016   Menopausal hot flushes 08/07/2015   H/O malignant neoplasm of skin 06/25/2015   Hypothyroidism 01/31/2015   History of cervical cancer 01/10/2015   Hyperlipidemia 02/03/2014   Medicare annual wellness visit, subsequent 01/19/2014   History of IBS 01/19/2014   S/P breast augmentation 01/19/2014   Primary osteoarthritis of left knee 07/06/2012   Colon polyps 01/30/2012   Low back pain 01/30/2012    CheDurwin RegesT 06/04/2022, 12:09 PM  [facility] [address]one: [phone]:  [phone]: ShiJADAE STEINKEN: [id number] of Birth: 6/206/08/1931

## 2022-06-09 ENCOUNTER — Ambulatory Visit (INDEPENDENT_AMBULATORY_CARE_PROVIDER_SITE_OTHER): Payer: Medicare HMO

## 2022-06-09 VITALS — Ht 62.0 in | Wt 121.0 lb

## 2022-06-09 DIAGNOSIS — Z0000 Encounter for general adult medical examination without abnormal findings: Secondary | ICD-10-CM | POA: Diagnosis not present

## 2022-06-09 NOTE — Patient Instructions (Signed)
  Emily Wu , Thank you for taking time to come for your Medicare Wellness Visit. I appreciate your ongoing commitment to your health goals. Please review the following plan we discussed and let me know if I can assist you in the future.   These are the goals we discussed:  Goals       Patient Stated     Follow up with Primary Care Provider (pt-stated)      Keep all scheduled maintenance appointments.         This is a list of the screening recommended for you and due dates:  Health Maintenance  Topic Date Due   Zoster (Shingles) Vaccine (2 of 2) 09/29/2018   Flu Shot  06/24/2022   Tetanus Vaccine  11/08/2023   Pneumonia Vaccine  Completed   DEXA scan (bone density measurement)  Completed   HPV Vaccine  Aged Out   Mammogram  Discontinued   COVID-19 Vaccine  Discontinued

## 2022-06-09 NOTE — Progress Notes (Cosign Needed)
Subjective:   Emily Wu is a 88 y.o. female who presents for Medicare Annual (Subsequent) preventive examination.  Review of Systems    No ROS.  Medicare Wellness Virtual Visit.  Visual/audio telehealth visit, UTA vital signs.   See social history for additional risk factors.   Cardiac Risk Factors include: advanced age (>63mn, >>55women)     Objective:    Today's Vitals   06/09/22 1448  Weight: 121 lb (54.9 kg)  Height: '5\' 2"'$  (1.575 m)   Body mass index is 22.13 kg/m.     06/09/2022    2:46 PM 04/09/2022    1:10 PM 12/24/2021   11:19 AM 12/10/2021    7:30 PM 11/29/2021   10:16 AM 06/06/2021    2:15 PM 07/16/2020    8:28 AM  Advanced Directives  Does Patient Have a Medical Advance Directive? Yes Yes Yes Yes Yes Yes No  Type of AParamedicof AP[place] AFBLiving will Living will Living will [place] will [place] will [place] will   Does patient want to make changes to medical advance directive? No - Patient declined No - Patient declined No - Patient declined No - Patient declined  No - Patient declined   Copy of HRevlocin Chart? Yes - validated most recent copy scanned in chart (See row information)   No - copy requested  Yes - validated most recent copy scanned in chart (See row information)   Would patient like information on creating a medical advance directive?       No - Patient declined    Current Medications (verified) Outpatient Encounter Medications as of 06/09/2022  Medication Sig   ACETAMINOPHEN ER PO Take 500 mg by mouth daily.   aspirin EC 325 MG tablet Take 1 tablet (325 mg total) by mouth 2 (two) times daily.   colestipol (COLESTID) 1 g tablet Take 1 g by mouth 2 (two) times daily.   docusate sodium (COLACE) 100 MG capsule Take 1 capsule (100 mg total) by mouth 2 (two) times daily. To prevent constipation while taking pain  medication.   donepezil (ARICEPT) 5 MG tablet Take 1 tablet (5 mg total) by mouth at bedtime. (Patient taking differently: Take 10 mg by mouth at bedtime.)   HYDROcodone-acetaminophen (NORCO) 5-325 MG tablet Take 1-2 tablets by mouth every 6 (six) hours as needed for severe pain. MAXIMUM TOTAL ACETAMINOPHEN DOSE IS 4000 MG PER DAY   ipratropium (ATROVENT) 0.06 % nasal spray Place 2 sprays into both nostrils 4 (four) times daily. FOR RUNNY NOSE   liothyronine (CYTOMEL) 5 MCG tablet TAKE ONE TABLET EVERY DAY   meclizine (ANTIVERT) 25 MG tablet Take 25 mg by mouth 2 (two) times daily as needed for dizziness.   mirtazapine (REMERON) 30 MG tablet TAKE ONE TABLET (30 MG) BY MOUTH AT BEDTIME   ondansetron (ZOFRAN) 4 MG tablet Take 1 tablet (4 mg total) by mouth every 8 (eight) hours as needed for nausea or vomiting.   rosuvastatin (CRESTOR) 10 MG tablet TAKE 1 TABLET BY MOUTH DAILY   vitamin B-12 (CYANOCOBALAMIN) 1000 MCG tablet Take 1,000 mcg by mouth daily.   No facility-administered encounter medications on file as of 06/09/2022.    Allergies (verified) Tramadol, Amoxicillin, Promethazine hcl, and Sulfa drugs cross reactors   History: Past Medical History:  Diagnosis Date   Arthritis    "knees, shoulders, chest" (02/03/2014)   Bruises easily  Cervical cancer [facility]) 1965   Concussion 03/11/2020   High cholesterol    History of kidney stones    Hypertension    DENIES    Tendency toward bleeding easily [facility])    Past Surgical History:  Procedure Laterality Date   ABDOMINAL HYSTERECTOMY  1965   APPENDECTOMY     CHOLECYSTECTOMY     KNEE ARTHROSCOPY Left    [place]   PARTIAL KNEE ARTHROPLASTY Left 01/26/2019   Procedure: UNICOMPARTMENTAL KNEE;  Surgeon: Marchia Bond, MD;  Location: [place];  Service: Orthopedics;  Laterality: Left;   PARTIAL KNEE ARTHROPLASTY Right 12/10/2021   Procedure: UNICOMPARTMENTAL KNEE;  Surgeon: Marchia Bond, MD;  Location: [facility];  Service: Orthopedics;  Laterality:  Right;   SHOULDER OPEN ROTATOR CUFF REPAIR Bilateral 2007,  2010   [place]   THROAT SURGERY  2014   "arthritis hugh knot" (02/03/2014)   Family History  Problem Relation Age of Onset   Peripheral vascular disease Mother    Stroke Father    Hyperlipidemia Father    Heart disease Father 41   Cancer Neg Hx    Social History   Socioeconomic History   Marital status: Married    Spouse name: Not on file   Number of children: Not on file   Years of education: Not on file   Highest education level: Not on file  Occupational History   Not on file  Tobacco Use   Smoking status: Former    Packs/day: 2.00    Years: 7.00    Total pack years: 14.00    Types: Cigarettes    Quit date: 21    Years since quitting: 63.5   Smokeless tobacco: Never   Tobacco comments:    02/03/2014 "quit smoking in the 1960's"  Vaping Use   Vaping Use: Never used  Substance and Sexual Activity   Alcohol use: No   Drug use: No   Sexual activity: Never  Other Topics Concern   Not on file  Social History Narrative   Not on file   Social Determinants of Health   Financial Resource Strain: Low Risk  (06/09/2022)   Overall Financial Resource Strain (CARDIA)    Difficulty of Paying Living Expenses: Not hard at all  Food Insecurity: No Food Insecurity (06/09/2022)   Hunger Vital Sign    Worried About Running Out of Food in the Last Year: Never true    [place] in the Last Year: Never true  Transportation Needs: No Transportation Needs (06/09/2022)   PRAPARE - Hydrologist (Medical): No    Lack of Transportation (Non-Medical): No  Physical Activity: Unknown (06/02/2019)   Exercise Vital Sign    Days of Exercise per Week: 0 days    Minutes of Exercise per Session: Not on file  Stress: No Stress Concern Present (06/09/2022)   [place]    Feeling of Stress : Not at all  Social Connections: Unknown  (06/09/2022)   Social Connection and Isolation Panel [NHANES]    Frequency of Communication with Friends and Family: More than three times a week    Frequency of Social Gatherings with Friends and Family: More than three times a week    Attends Religious Services: Not on file    Active Member of Clubs or Organizations: Not on file    Attends Archivist Meetings: Not on file    Marital Status: Widowed  Tobacco Counseling Counseling given: Not Answered Tobacco comments: 02/03/2014 "quit smoking in the 1960's"   Clinical Intake:  Pre-visit preparation completed: Yes        Diabetes: No  How often do you need to have someone help you when you read instructions, pamphlets, or other written materials from your doctor or pharmacy?: 1 - Never  Interpreter Needed?: No      Activities of Daily Living    06/09/2022    2:40 PM 12/10/2021    7:30 PM  In your present state of health, do you have any difficulty performing the following activities:  Hearing? 0 0  Vision? 0 0  Difficulty concentrating or making decisions? 1 1  Comment Taking medication as directed. slight memory impairment  Walking or climbing stairs? 1 1  Comment Walker in use   Dressing or bathing? 0 1  Doing errands, shopping? 1 1  Comment Daughter Diplomatic Services operational officer and eating ? Y   Comment Aide assist with meal prep. Self feeds.   Using the Toilet? N   In the past six months, have you accidently leaked urine? N   Do you have problems with loss of bowel control? N   Managing your Medications? Y   Comment Daughter assist   Managing your Finances? Y   Comment Daughter assist   Housekeeping or managing your Housekeeping? Y   Comment Maid assist     Patient Care Team: Crecencio Mc, MD as PCP - General (Internal Medicine)  Indicate any recent Medical Services you may have received from other than [facility] providers in the past year (date may be approximate).     Assessment:   This is a  routine wellness examination for [place].  Virtual Visit via Telephone Note  I connected with  Emily Wu on 06/09/22 at  2:30 PM EDT by telephone and verified that I am speaking with the correct person using two identifiers.  Persons participating in the virtual visit: patient/Nurse Health Advisor   I discussed the limitations of performing an evaluation and management service by telehealth. We continued and completed visit with audio only. Some vital signs may be absent or patient reported.   Hearing/Vision screen Hearing Screening - Comments:: Patient is able to hear conversational tones without difficulty. No issues reported.  Vision Screening - Comments:: Followed by [facility] Wears corrective lenses    Dietary issues and exercise activities discussed: Current Exercise Habits: Home exercise routine (Home physical therapy), Type of exercise: stretching, Time (Minutes): 15, Intensity: Mild Healthy diet   Goals Addressed               This Visit's Progress     Patient Stated     Follow up with Primary Care Provider (pt-stated)        Keep all scheduled maintenance appointments.        Depression Screen    06/09/2022    2:45 PM 01/02/2022   11:47 AM 09/11/2021   11:41 AM 07/04/2021    1:02 PM 06/06/2021    2:10 PM 03/28/2021   12:07 PM 12/14/2020    8:46 AM  PHQ 2/9 Scores  PHQ - 2 Score 0 0 1 0 0  0  PHQ- 9 Score    0   0  Exception Documentation      Medical reason     Fall Risk    06/09/2022    2:45 PM 01/02/2022   11:47 AM 09/11/2021   11:41  AM 07/04/2021    1:02 PM 06/06/2021    2:17 PM  Fall Risk   Falls in the past year? 0 0 1 1 0  Number falls in past yr: 0 0 0 1 0  Injury with Fall? 0 0 0 0   Risk for fall due to :  No Fall Risks  History of fall(s)   Follow up Falls evaluation completed Falls evaluation completed Falls evaluation completed Falls evaluation completed Falls evaluation completed    [place]: Home free of loose throw rugs in walkways, pet beds, electrical cords, etc? Yes  Adequate lighting in your home to reduce risk of falls? Yes   ASSISTIVE DEVICES UTILIZED TO PREVENT FALLS: Use of a cane, walker or w/c? Yes   TIMED UP AND GO: Was the test performed? No .   Cognitive Function: Patient is alert.  Taking medication as directed.  Followed by Neurology.         06/06/2021    2:39 PM 06/05/2020    2:18 PM 06/02/2019    1:58 PM  6CIT Screen  What Year? 0 points 0 points 0 points  What month? 0 points 0 points 0 points  What time? 0 points  0 points  Count back from 20   0 points  Months in reverse 4 points 0 points 0 points  Repeat phrase  0 points     Immunizations Immunization History  Administered Date(s) Administered   Influenza-Unspecified 09/24/2018   Pneumococcal Conjugate-13 01/18/2014   Pneumococcal Polysaccharide-23 01/09/2015   Tdap 11/07/2013   Zoster Recombinat (Shingrix) 08/04/2018   Zoster, Live 01/19/2012   Shingrix vaccine- second dose do. Update immunization record upon completion.   Screening Tests Health Maintenance  Topic Date Due   Zoster Vaccines- Shingrix (2 of 2) 09/29/2018   INFLUENZA VACCINE  06/24/2022   TETANUS/TDAP  11/08/2023   Pneumonia Vaccine 7+ Years old  Completed   DEXA SCAN  Completed   HPV VACCINES  Aged Out   MAMMOGRAM  Discontinued   COVID-19 Vaccine  Discontinued   Health Maintenance Health Maintenance Due  Topic Date Due   Zoster Vaccines- Shingrix (2 of 2) 09/29/2018   Hepatitis C Screening: does not qualify  Vision Screening: Recommended annual ophthalmology exams for early detection of glaucoma and other disorders of the eye.  Dental Screening: Recommended annual dental exams for proper oral hygiene  Community Resource Referral / Chronic Care Management: CRR required this visit?  No   CCM required this visit?  No      Plan:   Keep all routine maintenance appointments.   I have  personally reviewed and noted the following in the patient's chart:   Medical and social history Use of alcohol, tobacco or illicit drugs  Current medications and supplements including opioid prescriptions.  Functional ability and status Nutritional status Physical activity Advanced directives List of other physicians Hospitalizations, surgeries, and ER visits in previous 12 months Vitals Screenings to include cognitive, depression, and falls Referrals and appointments  In addition, I have reviewed and discussed with patient certain preventive protocols, quality metrics, and best practice recommendations. A written personalized care plan for preventive services as well as general preventive health recommendations were provided to patient.     OBrien-Blaney, Shawn Carattini L, LPN   1/61/0960    I have reviewed the above information and agree with above.   Deborra Medina, MD

## 2022-06-11 DIAGNOSIS — M1711 Unilateral primary osteoarthritis, right knee: Secondary | ICD-10-CM | POA: Diagnosis not present

## 2022-06-12 ENCOUNTER — Encounter: Payer: Medicare HMO | Admitting: Physical Therapy

## 2022-06-13 ENCOUNTER — Other Ambulatory Visit: Payer: Medicare HMO

## 2022-06-13 DIAGNOSIS — Z515 Encounter for palliative care: Secondary | ICD-10-CM

## 2022-06-13 NOTE — Progress Notes (Signed)
PATIENT NAME: Emily Wu DOB: 1931/02/20 MRN: [id number]  PRIMARY CARE PROVIDER: Crecencio Mc, MD  RESPONSIBLE PARTY:  Acct ID - Guarantor Home Phone Work Phone Relationship Acct Type  [id number] BREIA, [phone]  Self P/F     [address] [place], [place] [address]    I connected with  Konrad Felix on 06/13/22 by telephone and verified that I am speaking with the correct person using two identifiers.   I discussed the limitations of evaluation and management by telemedicine. The patient expressed understanding and agreed to proceed.   Follow up call completed with patient over the phone.  Patient advises she is doing well.  Continues with a private caregiver from 8-2 and is very happy with this service.  PT completed last week and patient has been released from orthopedics after 6 month check up for TKR.  Patient will be going to [place] next week to be assessed for a back brace.  She continues to have issues with her scoliosis and is unable to stand up properly.  She is hopeful a back brace with assist with proper support when she is ambulating.  Using a rollator and no falls reported. No issues with sleep.  Appetite continues to be good with no weight loss noted by patient. Patient is going out for meals at times and will cook light meals when her caregiver is  present.  Daughters continue to assist patient on the weekends.  No new concerns voiced by patient today.  Palliative Care will continue to follow every 8-12 weeks and PRN.       Lorenza Burton, RN

## 2022-06-17 ENCOUNTER — Encounter: Payer: Medicare HMO | Admitting: Physical Therapy

## 2022-06-24 ENCOUNTER — Encounter: Payer: Medicare HMO | Admitting: Physical Therapy

## 2022-07-01 ENCOUNTER — Encounter: Payer: Medicare HMO | Admitting: Physical Therapy

## 2022-07-08 ENCOUNTER — Encounter: Payer: Medicare HMO | Admitting: Physical Therapy

## 2022-07-15 ENCOUNTER — Encounter: Payer: Medicare HMO | Admitting: Physical Therapy

## 2022-07-16 DIAGNOSIS — R194 Change in bowel habit: Secondary | ICD-10-CM | POA: Diagnosis not present

## 2022-07-16 DIAGNOSIS — K6289 Other specified diseases of anus and rectum: Secondary | ICD-10-CM | POA: Diagnosis not present

## 2022-07-22 ENCOUNTER — Encounter: Payer: Medicare HMO | Admitting: Physical Therapy

## 2022-07-29 DIAGNOSIS — E612 Magnesium deficiency: Secondary | ICD-10-CM | POA: Diagnosis not present

## 2022-07-29 DIAGNOSIS — E538 Deficiency of other specified B group vitamins: Secondary | ICD-10-CM | POA: Diagnosis not present

## 2022-07-29 DIAGNOSIS — G2581 Restless legs syndrome: Secondary | ICD-10-CM | POA: Diagnosis not present

## 2022-07-29 DIAGNOSIS — F5104 Psychophysiologic insomnia: Secondary | ICD-10-CM | POA: Diagnosis not present

## 2022-07-29 DIAGNOSIS — E611 Iron deficiency: Secondary | ICD-10-CM | POA: Diagnosis not present

## 2022-07-29 DIAGNOSIS — G309 Alzheimer's disease, unspecified: Secondary | ICD-10-CM | POA: Diagnosis not present

## 2022-07-29 DIAGNOSIS — F0150 Vascular dementia without behavioral disturbance: Secondary | ICD-10-CM | POA: Diagnosis not present

## 2022-07-29 DIAGNOSIS — F0280 Dementia in other diseases classified elsewhere without behavioral disturbance: Secondary | ICD-10-CM | POA: Diagnosis not present

## 2022-08-15 DIAGNOSIS — L298 Other pruritus: Secondary | ICD-10-CM | POA: Diagnosis not present

## 2022-08-15 DIAGNOSIS — L570 Actinic keratosis: Secondary | ICD-10-CM | POA: Diagnosis not present

## 2022-08-15 DIAGNOSIS — L538 Other specified erythematous conditions: Secondary | ICD-10-CM | POA: Diagnosis not present

## 2022-08-15 DIAGNOSIS — L820 Inflamed seborrheic keratosis: Secondary | ICD-10-CM | POA: Diagnosis not present

## 2022-08-15 DIAGNOSIS — X32XXXA Exposure to sunlight, initial encounter: Secondary | ICD-10-CM | POA: Diagnosis not present

## 2022-08-15 DIAGNOSIS — L821 Other seborrheic keratosis: Secondary | ICD-10-CM | POA: Diagnosis not present

## 2022-08-22 ENCOUNTER — Other Ambulatory Visit: Payer: Self-pay | Admitting: Internal Medicine

## 2022-10-22 DIAGNOSIS — R197 Diarrhea, unspecified: Secondary | ICD-10-CM | POA: Diagnosis not present

## 2022-11-25 ENCOUNTER — Other Ambulatory Visit: Payer: Self-pay | Admitting: Internal Medicine

## 2023-01-02 DIAGNOSIS — H25013 Cortical age-related cataract, bilateral: Secondary | ICD-10-CM | POA: Diagnosis not present

## 2023-01-02 DIAGNOSIS — H5203 Hypermetropia, bilateral: Secondary | ICD-10-CM | POA: Diagnosis not present

## 2023-01-02 DIAGNOSIS — H524 Presbyopia: Secondary | ICD-10-CM | POA: Diagnosis not present

## 2023-01-02 DIAGNOSIS — H52223 Regular astigmatism, bilateral: Secondary | ICD-10-CM | POA: Diagnosis not present

## 2023-01-02 DIAGNOSIS — H2513 Age-related nuclear cataract, bilateral: Secondary | ICD-10-CM | POA: Diagnosis not present

## 2023-01-02 DIAGNOSIS — H43813 Vitreous degeneration, bilateral: Secondary | ICD-10-CM | POA: Diagnosis not present

## 2023-01-20 DIAGNOSIS — B078 Other viral warts: Secondary | ICD-10-CM | POA: Diagnosis not present

## 2023-01-20 DIAGNOSIS — D485 Neoplasm of uncertain behavior of skin: Secondary | ICD-10-CM | POA: Diagnosis not present

## 2023-01-20 DIAGNOSIS — Z85828 Personal history of other malignant neoplasm of skin: Secondary | ICD-10-CM | POA: Diagnosis not present

## 2023-01-20 DIAGNOSIS — L814 Other melanin hyperpigmentation: Secondary | ICD-10-CM | POA: Diagnosis not present

## 2023-01-20 DIAGNOSIS — Z08 Encounter for follow-up examination after completed treatment for malignant neoplasm: Secondary | ICD-10-CM | POA: Diagnosis not present

## 2023-01-20 DIAGNOSIS — L821 Other seborrheic keratosis: Secondary | ICD-10-CM | POA: Diagnosis not present

## 2023-01-20 DIAGNOSIS — L570 Actinic keratosis: Secondary | ICD-10-CM | POA: Diagnosis not present

## 2023-01-20 DIAGNOSIS — X32XXXA Exposure to sunlight, initial encounter: Secondary | ICD-10-CM | POA: Diagnosis not present

## 2023-02-18 ENCOUNTER — Other Ambulatory Visit: Payer: Self-pay | Admitting: Internal Medicine

## 2023-03-07 ENCOUNTER — Other Ambulatory Visit: Payer: Self-pay | Admitting: Internal Medicine

## 2023-03-18 ENCOUNTER — Encounter: Payer: Self-pay | Admitting: Internal Medicine

## 2023-03-18 ENCOUNTER — Ambulatory Visit (INDEPENDENT_AMBULATORY_CARE_PROVIDER_SITE_OTHER): Payer: Medicare HMO | Admitting: Internal Medicine

## 2023-03-18 VITALS — BP 118/72 | HR 57 | Temp 97.9°F | Ht 62.0 in | Wt 125.4 lb

## 2023-03-18 DIAGNOSIS — G309 Alzheimer's disease, unspecified: Secondary | ICD-10-CM

## 2023-03-18 DIAGNOSIS — Z0000 Encounter for general adult medical examination without abnormal findings: Secondary | ICD-10-CM | POA: Diagnosis not present

## 2023-03-18 DIAGNOSIS — R7303 Prediabetes: Secondary | ICD-10-CM

## 2023-03-18 DIAGNOSIS — F0280 Dementia in other diseases classified elsewhere without behavioral disturbance: Secondary | ICD-10-CM

## 2023-03-18 DIAGNOSIS — D649 Anemia, unspecified: Secondary | ICD-10-CM | POA: Diagnosis not present

## 2023-03-18 DIAGNOSIS — E7800 Pure hypercholesterolemia, unspecified: Secondary | ICD-10-CM

## 2023-03-18 DIAGNOSIS — K909 Intestinal malabsorption, unspecified: Secondary | ICD-10-CM | POA: Diagnosis not present

## 2023-03-18 DIAGNOSIS — K8681 Exocrine pancreatic insufficiency: Secondary | ICD-10-CM | POA: Diagnosis not present

## 2023-03-18 DIAGNOSIS — E034 Atrophy of thyroid (acquired): Secondary | ICD-10-CM

## 2023-03-18 DIAGNOSIS — R293 Abnormal posture: Secondary | ICD-10-CM

## 2023-03-18 DIAGNOSIS — F0150 Vascular dementia without behavioral disturbance: Secondary | ICD-10-CM

## 2023-03-18 DIAGNOSIS — R197 Diarrhea, unspecified: Secondary | ICD-10-CM

## 2023-03-18 LAB — CBC WITH DIFFERENTIAL/PLATELET
Basophils Absolute: 0 10*3/uL (ref 0.0–0.1)
Basophils Relative: 0.3 % (ref 0.0–3.0)
Eosinophils Absolute: 0.1 10*3/uL (ref 0.0–0.7)
Eosinophils Relative: 1.2 % (ref 0.0–5.0)
HCT: 41.2 % (ref 36.0–46.0)
Hemoglobin: 13.7 g/dL (ref 12.0–15.0)
Lymphocytes Relative: 36 % (ref 12.0–46.0)
Lymphs Abs: 2.7 10*3/uL (ref 0.7–4.0)
MCHC: 33.2 g/dL (ref 30.0–36.0)
MCV: 94.8 fl (ref 78.0–100.0)
Monocytes Absolute: 0.5 10*3/uL (ref 0.1–1.0)
Monocytes Relative: 7.1 % (ref 3.0–12.0)
Neutro Abs: 4.1 10*3/uL (ref 1.4–7.7)
Neutrophils Relative %: 55.4 % (ref 43.0–77.0)
Platelets: 252 10*3/uL (ref 150.0–400.0)
RBC: 4.35 Mil/uL (ref 3.87–5.11)
RDW: 14.1 % (ref 11.5–15.5)
WBC: 7.4 10*3/uL (ref 4.0–10.5)

## 2023-03-18 LAB — COMPREHENSIVE METABOLIC PANEL
ALT: 16 U/L (ref 0–35)
AST: 21 U/L (ref 0–37)
Albumin: 4 g/dL (ref 3.5–5.2)
Alkaline Phosphatase: 61 U/L (ref 39–117)
BUN: 16 mg/dL (ref 6–23)
CO2: 26 mEq/L (ref 19–32)
Calcium: 9.3 mg/dL (ref 8.4–10.5)
Chloride: 106 mEq/L (ref 96–112)
Creatinine, Ser: 0.97 mg/dL (ref 0.40–1.20)
GFR: 51.69 mL/min — ABNORMAL LOW (ref >60.00)
Glucose, Bld: 82 mg/dL (ref 70–99)
Potassium: 4.2 mEq/L (ref 3.5–5.1)
Sodium: 142 mEq/L (ref 135–145)
Total Bilirubin: 0.3 mg/dL (ref 0.2–1.2)
Total Protein: 6.3 g/dL (ref 6.0–8.3)

## 2023-03-18 LAB — LIPID PANEL
Cholesterol: 165 mg/dL (ref 0–200)
HDL: 31.2 mg/dL — ABNORMAL LOW (ref >39.00)
NonHDL: 134.15
Total CHOL/HDL Ratio: 5
Triglycerides: 325 mg/dL — ABNORMAL HIGH (ref 0.0–149.0)
VLDL: 65 mg/dL — ABNORMAL HIGH (ref 0.0–40.0)

## 2023-03-18 LAB — HEMOGLOBIN A1C: Hgb A1c MFr Bld: 5.7 % (ref 4.6–6.5)

## 2023-03-18 LAB — TSH: TSH: 3.28 u[IU]/mL (ref 0.35–5.50)

## 2023-03-18 LAB — LDL CHOLESTEROL, DIRECT: Direct LDL: 81 mg/dL

## 2023-03-18 MED ORDER — MIRTAZAPINE 30 MG PO TABS: ORAL_TABLET | ORAL | 0 refills | Status: DC

## 2023-03-18 MED ORDER — IPRATROPIUM BROMIDE 0.06 % NA SOLN: 2.0000 | Freq: Four times a day (QID) | NASAL | 12 refills | Status: DC

## 2023-03-18 NOTE — Assessment & Plan Note (Signed)
No progression based on today's exam. She has discontinued Aricept as it aggrvated her diarrhea. .  She has a daytime health aide and her daughter Zella Ball managed her medications

## 2023-03-18 NOTE — Assessment & Plan Note (Signed)
She has severe /advanced thoracolumbar kyphosis and degenerative changes . She is using wa 4 footed walker and wears Life Alert

## 2023-03-18 NOTE — Patient Instructions (Addendum)
1) YOUR RUNNY NOSE IS DUE TO 2 DIFFERENT ISSUES , SO IT REQUIRES 2 NASAL SPRAYS:   Start using Atrovent nasal spray ,  2 TO 4 times daily   THIS SHOULD BE USED YEAR ROUND)  BECAUSE IT IS NOT FOR ALLERGIES.    CONTINUE USING once daily  flonase  every day DURING ALLERGY SEASON    2) THE RECTAL IRRITATION MAY BE DUE TO THE DIARRHEA   YOU SHOULD  BE USING LOMOTIL FOR DIARRHEA EVERY MORNING AND EVERY AFTERNOON ,  BASED ON THE GI OFFICE NOTE FROM NOVEMBER  IF YOU ARE NOT,  THE RECTUM WILL BE IRRITATED BY ALL THAT DIARRHEA

## 2023-03-18 NOTE — Progress Notes (Signed)
Patient ID: Emily Wu, female    DOB: 1931/01/15  Age: 89 y.o. MRN: [id number]  The patient is here for annual preventive examination and management of other chronic and acute problems.   The risk factors are reflected in the social history.   The roster of all physicians providing medical care to patient - is listed in the Snapshot section of the chart.   Activities of daily living:  The patient is 100% independent in all ADLs: dressing, toileting, feeding as well as independent mobility   Home safety : The patient has smoke detectors in the home. They wear seatbelts.  There are no unsecured firearms at home. There is no violence in the home.    There is no risks for hepatitis, STDs or HIV. There is no   history of blood transfusion. They have no travel history to infectious disease endemic areas of the world.   The patient has seen their dentist in the last six month. They have seen their eye doctor in the last year. The patinet  denies slight hearing difficulty with regard to whispered voices and some television programs.  They have deferred audiologic testing in the last year.  They do not  have excessive sun exposure. Discussed the need for sun protection: hats, long sleeves and use of sunscreen if there is significant sun exposure.    Diet: the importance of a healthy diet is discussed. They do have a healthy diet.   The benefits of regular aerobic exercise were discussed. The patient  works in her  garden  several days per week and walks around the house.     Depression screen: there are no signs or vegative symptoms of depression- irritability, change in appetite, anhedonia, sadness/tearfullness.   The following portions of the patient's history were reviewed and updated as appropriate: allergies, current medications, past family history, past medical history,  past surgical history, past social history  and problem list.   Visual acuity was not assessed per patient preference  since the patient has regular follow up with an  ophthalmologist. Hearing and body mass index were assessed and reviewed.    During the course of the visit the patient was educated and counseled about appropriate screening and preventive services including : fall prevention , diabetes screening, nutrition counseling, colorectal cancer screening, and recommended immunizations.    Chief Complaint:  1) vasomotor rhinitis;  atrovent no longer working . Sneezing.    2) aortic atherosclerosis:  we reviewed findings of prior CT scan today..  Patient is tolerating high potency statin therapy     3) rectal pain and irritation:  still having several (2-3 ) loose stools daily . She does not recall if she is using the Lomotil  presribed in late November by GI in [place]   Review of Symptoms  Patient denies headache, fevers, malaise, unintentional weight loss, skin rash, eye pain, sinus congestion and sinus pain, sore throat, dysphagia,  hemoptysis , cough, dyspnea, wheezing, chest pain, palpitations, orthopnea, edema, abdominal pain, nausea, melena, diarrhea, constipation, flank pain, dysuria, hematuria, urinary  Frequency, nocturia, numbness, tingling, seizures,  Focal weakness, Loss of consciousness,  Tremor, insomnia, depression, anxiety, and suicidal ideation.    Physical Exam:  BP 118/72   Pulse (!) 57   Temp 97.9 F (36.6 C) (Oral)   Ht  (1.575 m)   Wt 125 lb 6.4 oz (56.9 kg)   SpO2 97%   BMI 22.94 kg/m    Physical Exam Vitals reviewed.  Constitutional:      General: She is not in acute distress.    Appearance: Normal appearance. She is normal weight. She is not ill-appearing, toxic-appearing or diaphoretic.  HENT:     Head: Normocephalic.  Eyes:     General: No scleral icterus.       Right eye: No discharge.        Left eye: No discharge.     Conjunctiva/sclera: Conjunctivae normal.  Cardiovascular:     Rate and Rhythm: Normal rate and regular rhythm.     Heart sounds: Normal  heart sounds.  Pulmonary:     Effort: Pulmonary effort is normal. No respiratory distress.     Breath sounds: Normal breath sounds.  Musculoskeletal:        General: Normal range of motion.  Skin:    General: Skin is warm and dry.  Neurological:     General: No focal deficit present.     Mental Status: She is alert and oriented to person, place, and time. Mental status is at baseline.  Psychiatric:        Mood and Affect: Mood normal.        Behavior: Behavior normal.        Thought Content: Thought content normal.        Judgment: Judgment normal.    Assessment and Plan: Hypothyroidism due to acquired atrophy of thyroid -     TSH  Pure hypercholesterolemia -     Lipid panel -     LDL cholesterol, direct  Anemia, unspecified type -     CBC with Differential/Platelet  Prediabetes -     Comprehensive metabolic panel -     Hemoglobin A1c -     Microalbumin / creatinine urine ratio; Future  Diarrhea due to malabsorption Assessment & Plan: No longer taking colestid per GI.  Started on lomotil qam in November. By GI BUT may not be taking it correctly.  Advised to resume twice daily minimum   Abnormal posture Assessment & Plan: She has severe /advanced thoracolumbar kyphosis and degenerative changes . She is using wa 4 footed walker and wears Life Alert    Exocrine pancreatic insufficiency Assessment & Plan: Confirmed with low  Pancreatic elastase level in September  2022,   Resulting  in chronic diarrhea and malabsorption/.  History of medication trials:  Could not afford Creon,  was switched to Colestid.  Diarrhea had resolved with use of Colestid 1 but then returned .  She is now seeing a [place] GI clinic and supposed to be taking Lomotil 1-2 times daily  per review of Nov 29 office noted,; however she has not followed up with them and is  not sure she is taking it. [facility] history [facility]:  last refill for #120  on Feb 20 .     Mixed Alzheimer's and  vascular dementia Assessment & Plan: No progression based on today's exam. She has discontinued Aricept as it aggrvated her diarrhea. .  She has a daytime health aide and her daughter Emily Wu managed her medications   Encounter for preventive health examination Assessment & Plan: age appropriate education and counseling updated, referrals for preventative services and immunizations addressed, dietary and smoking counseling addressed, most recent labs reviewed.  I have personally reviewed and have noted:   1) the patient's medical and social history 2) The pt's use of alcohol, tobacco, and illicit drugs 3) The patient's current medications and supplements 4) Functional ability including ADL's, fall  risk, home safety risk, hearing and visual impairment 5) Diet and physical activities 6) Evidence for depression or mood disorder 7) The patient's height, weight, and BMI have been recorded in the chart     I have made referrals, and provided counseling and education based on review of the above    Other orders -     Mirtazapine; TAKE ONE TABLET (30 MG) BY MOUTH AT BEDTIME  Dispense: 90 tablet; Refill: 0 -     Ipratropium Bromide; Place 2 sprays into both nostrils 4 (four) times daily. FOR RUNNY NOSE  Dispense: 15 mL; Refill: 12    No follow-ups on file.  Sherlene Shams, MD

## 2023-03-18 NOTE — Assessment & Plan Note (Addendum)
No longer taking colestid per GI.  Started on lomotil qam in November. By GI BUT may not be taking it correctly.  Advised to resume twice daily minimum

## 2023-03-18 NOTE — Assessment & Plan Note (Signed)

## 2023-03-18 NOTE — Assessment & Plan Note (Signed)
Confirmed with low  Pancreatic elastase level in September  2022,   Resulting  in chronic diarrhea and malabsorption/.  History of medication trials:  Could not afford Creon,  was switched to Colestid.  Diarrhea had resolved with use of Colestid 1 but then returned .  She is now seeing a [place] GI clinic and supposed to be taking Lomotil 1-2 times daily  per review of Nov 29 office noted,; however she has not followed up with them and is  not sure she is taking it. Refill history [facility] obtained [facility]:  last refill for #120  on Feb 20 .

## 2023-03-20 LAB — MICROALBUMIN / CREATININE URINE RATIO
Creatinine,U: 58.5 mg/dL
Microalb Creat Ratio: 1.9 mg/g (ref 0.0–30.0)
Microalb, Ur: 1.1 mg/dL (ref 0.0–1.9)

## 2023-06-05 DIAGNOSIS — H524 Presbyopia: Secondary | ICD-10-CM | POA: Diagnosis not present

## 2023-06-05 DIAGNOSIS — Z135 Encounter for screening for eye and ear disorders: Secondary | ICD-10-CM | POA: Diagnosis not present

## 2023-06-05 DIAGNOSIS — H25013 Cortical age-related cataract, bilateral: Secondary | ICD-10-CM | POA: Diagnosis not present

## 2023-06-05 DIAGNOSIS — H43813 Vitreous degeneration, bilateral: Secondary | ICD-10-CM | POA: Diagnosis not present

## 2023-06-05 DIAGNOSIS — H5203 Hypermetropia, bilateral: Secondary | ICD-10-CM | POA: Diagnosis not present

## 2023-06-05 DIAGNOSIS — H2513 Age-related nuclear cataract, bilateral: Secondary | ICD-10-CM | POA: Diagnosis not present

## 2023-06-05 DIAGNOSIS — H52223 Regular astigmatism, bilateral: Secondary | ICD-10-CM | POA: Diagnosis not present

## 2023-06-26 ENCOUNTER — Ambulatory Visit: Payer: Medicare HMO | Admitting: *Deleted

## 2023-06-26 VITALS — Ht 62.0 in | Wt 120.0 lb

## 2023-06-26 DIAGNOSIS — Z0000 Encounter for general adult medical examination without abnormal findings: Secondary | ICD-10-CM

## 2023-06-26 NOTE — Progress Notes (Cosign Needed)
Subjective:   Emily Wu is a 89 y.o. female who presents for Medicare Annual (Subsequent) preventive examination.  Visit Complete: Virtual  I connected with  Emily Wu on 06/26/23 by a audio enabled telemedicine application and verified that I am speaking with the correct person using two identifiers.  Patient Location: Home  Provider Location: Office/Clinic  I discussed the limitations of evaluation and management by telemedicine. The patient expressed understanding and agreed to proceed.  Vital Signs: Unable to obtain new vitals due to this being a telehealth visit.  Review of Systems     Cardiac Risk Factors include: advanced age (>49men, >44 women);dyslipidemia;Other (see comment), Risk factor comments: LBBB     Objective:    Today's Vitals   06/26/23 0900  Weight: 120 lb (54.4 kg)  Height: 5\' 2"  (1.575 m)   Body mass index is 21.95 kg/m.     06/26/2023    9:18 AM 06/09/2022    2:46 PM 04/09/2022    1:10 PM 12/24/2021   11:19 AM 12/10/2021    7:30 PM 11/29/2021   10:16 AM 06/06/2021    2:15 PM  Advanced Directives  Does Patient Have a Medical Advance Directive? Yes Yes Yes Yes Yes Yes Yes  Type of Estate agent of [place];Living will Healthcare Power of [place];Living will Living will Living will Healthcare Power of [place];Living will Healthcare Power of [place];Living will Healthcare Power of [place];Living will  Does patient want to make changes to medical advance directive? No - Patient declined No - Patient declined No - Patient declined No - Patient declined No - Patient declined  No - Patient declined  Copy of Healthcare Power of Attorney in Chart? Yes - validated most recent copy scanned in chart (See row information) Yes - validated most recent copy scanned in chart (See row information)   No - copy requested  Yes - validated most recent copy scanned in chart (See row information)    Current Medications (verified) Outpatient  Encounter Medications as of 06/26/2023  Medication Sig   ACETAMINOPHEN ER PO Take 500 mg by mouth daily.   diphenoxylate-atropine (LOMOTIL) 2.5-0.025 MG tablet Take 1 tablet each morning with breakfast.  You may take an additional tablet during the day with each loose stool for a maximum of 4 tablets per day.   docusate sodium (COLACE) 100 MG capsule Take 1 capsule (100 mg total) by mouth 2 (two) times daily. To prevent constipation while taking pain medication.   donepezil (ARICEPT) 10 MG tablet Take 10 mg by mouth at bedtime.   ipratropium (ATROVENT) 0.06 % nasal spray Place 2 sprays into both nostrils 4 (four) times daily. FOR RUNNY NOSE   liothyronine (CYTOMEL) 5 MCG tablet TAKE 1 TABLET BY MOUTH DAILY   mirtazapine (REMERON) 30 MG tablet TAKE ONE TABLET (30 MG) BY MOUTH AT BEDTIME   rosuvastatin (CRESTOR) 10 MG tablet TAKE 1 TABLET BY MOUTH DAILY   vitamin B-12 (CYANOCOBALAMIN) 1000 MCG tablet Take 1,000 mcg by mouth daily.   No facility-administered encounter medications on file as of 06/26/2023.    Allergies (verified) Tramadol, Amoxicillin, Promethazine hcl, and Sulfa drugs cross reactors   History: Past Medical History:  Diagnosis Date   Arthritis    "knees, shoulders, chest" (02/03/2014)   Bruises easily    Cervical cancer [facility]) 1965   Concussion 03/11/2020   High cholesterol    History of kidney stones    Hypertension    DENIES    Tendency toward  bleeding easily [facility])    Past Surgical History:  Procedure Laterality Date   ABDOMINAL HYSTERECTOMY  1965   APPENDECTOMY     CHOLECYSTECTOMY     KNEE ARTHROSCOPY Left    [facility]   PARTIAL KNEE ARTHROPLASTY Left 01/26/2019   Procedure: UNICOMPARTMENTAL KNEE;  Surgeon: Teryl Lucy, MD;  Location: [facility] OR;  Service: Orthopedics;  Laterality: Left;   PARTIAL KNEE ARTHROPLASTY Right 12/10/2021   Procedure: UNICOMPARTMENTAL KNEE;  Surgeon: Teryl Lucy, MD;  Location: [facility];  Service: Orthopedics;  Laterality: Right;   SHOULDER OPEN  ROTATOR CUFF REPAIR Bilateral 2007,  2010   [facility]   THROAT SURGERY  2014   "arthritis hugh knot" (02/03/2014)   Family History  Problem Relation Age of Onset   Peripheral vascular disease Mother    Stroke Father    Hyperlipidemia Father    Heart disease Father 97   Cancer Neg Hx    Social History   Socioeconomic History   Marital status: Married    Spouse name: Not on file   Number of children: Not on file   Years of education: Not on file   Highest education level: Not on file  Occupational History   Not on file  Tobacco Use   Smoking status: Former    Current packs/day: 0.00    Average packs/day: 2.0 packs/day for 7.0 years (14.0 ttl pk-yrs)    Types: Cigarettes    Start date: 37    Quit date: 89    Years since quitting: 64.6   Smokeless tobacco: Never   Tobacco comments:    02/03/2014 "quit smoking in the 1960's"  Vaping Use   Vaping status: Never Used  Substance and Sexual Activity   Alcohol use: No   Drug use: No   Sexual activity: Never  Other Topics Concern   Not on file  Social History Narrative   Widowed   Has a caregiver 6 days a week   Social Determinants of Health   Financial Resource Strain: Low Risk  (06/26/2023)   Overall Financial Resource Strain (CARDIA)    Difficulty of Paying Living Expenses: Not hard at all  Food Insecurity: No Food Insecurity (06/26/2023)   Hunger Vital Sign    Worried About Running Out of Food in the Last Year: Never true    Ran Out of Food in the Last Year: Never true  Transportation Needs: No Transportation Needs (06/26/2023)   PRAPARE - Administrator, Civil Service (Medical): No    Lack of Transportation (Non-Medical): No  Physical Activity: Inactive (06/26/2023)   Exercise Vital Sign    Days of Exercise per Week: 0 days    Minutes of Exercise per Session: 0 min  Stress: No Stress Concern Present (06/26/2023)   [employer] of Occupational Health - Occupational Stress Questionnaire    Feeling of Stress  : Only a little  Social Connections: Socially Isolated (06/26/2023)   Social Connection and Isolation Panel [NHANES]    Frequency of Communication with Friends and Family: More than three times a week    Frequency of Social Gatherings with Friends and Family: More than three times a week    Attends Religious Services: Never    Database administrator or Organizations: No    Attends Banker Meetings: Never    Marital Status: Widowed    Tobacco Counseling Counseling given: Not Answered Tobacco comments: 02/03/2014 "quit smoking in the 1960's"   Clinical Intake:  Pre-visit preparation completed: Yes  Pain : No/denies pain     BMI - recorded: 21.95 Nutritional Status: BMI of 19-24  Normal Nutritional Risks: None Diabetes: No  How often do you need to have someone help you when you read instructions, pamphlets, or other written materials from your doctor or pharmacy?: 1 - Never  Interpreter Needed?: No  Information entered by :: R.  LPN   Activities of Daily Living    06/26/2023    9:04 AM  In your present state of health, do you have any difficulty performing the following activities:  Hearing? 0  Vision? 0  Comment glasses  Difficulty concentrating or making decisions? 1  Comment a little  Walking or climbing stairs? 1  Dressing or bathing? 0  Doing errands, shopping? 1  Comment care taker  Preparing Food and eating ? Y  Using the Toilet? N  In the past six months, have you accidently leaked urine? Y  Comment wears pads  Do you have problems with loss of bowel control? N  Managing your Medications? Y  Comment daughter manages  Managing your Finances? Y  Comment daughter helps  Housekeeping or managing your Housekeeping? Y  Comment care taker    Patient Care Team: Sherlene Shams, MD as PCP - General (Internal Medicine)  Indicate any recent Medical Services you may have received from other than [facility] providers in the past year (date may be  approximate).     Assessment:   This is a routine wellness examination for [place].  Hearing/Vision screen Hearing Screening - Comments:: No issues Vision Screening - Comments:: Having cataract surgery in October  Dietary issues and exercise activities discussed:     Goals Addressed             This Visit's Progress    Patient Stated       None       Depression Screen    06/26/2023    9:12 AM 03/18/2023   10:22 AM 06/09/2022    2:45 PM 01/02/2022   11:47 AM 09/11/2021   11:41 AM 07/04/2021    1:02 PM 06/06/2021    2:10 PM  PHQ 2/9 Scores  PHQ - 2 Score 0 0 0 0 1 0 0  PHQ- 9 Score 1     0     Fall Risk    06/26/2023    9:08 AM 03/18/2023   10:22 AM 06/09/2022    2:45 PM 01/02/2022   11:47 AM 09/11/2021   11:41 AM  Fall Risk   Falls in the past year? 0 0 0 0 1  Number falls in past yr: 0 0 0 0 0  Injury with Fall? 0 0 0 0 0  Risk for fall due to : No Fall Risks No Fall Risks  No Fall Risks   Follow up Falls prevention discussed;Falls evaluation completed Falls evaluation completed Falls evaluation completed Falls evaluation completed Falls evaluation completed    MEDICARE RISK AT HOME:  Medicare Risk at Home - 06/26/23 0908     Any stairs in or around the home? Yes    If so, are there any without handrails? No    Home free of loose throw rugs in walkways, pet beds, electrical cords, etc? Yes    Adequate lighting in your home to reduce risk of falls? Yes    Life alert? Yes    Use of a cane, walker or w/c? Yes    Grab bars in the bathroom? Yes    Shower  chair or bench in shower? Yes    Elevated toilet seat or a handicapped toilet? Yes              Cognitive Function:        06/26/2023    9:18 AM 06/06/2021    2:39 PM 06/05/2020    2:18 PM 06/02/2019    1:58 PM  6CIT Screen  What Year? 0 points 0 points 0 points 0 points  What month? 0 points 0 points 0 points 0 points  What time? 0 points 0 points  0 points  Count back from 20 4 points   0 points   Months in reverse 4 points 4 points 0 points 0 points  Repeat phrase 2 points  0 points   Total Score 10 points       Immunizations Immunization History  Administered Date(s) Administered   Influenza-Unspecified 09/24/2018   Pneumococcal Conjugate-13 01/18/2014   Pneumococcal Polysaccharide-23 01/09/2015   Tdap 11/07/2013   Zoster Recombinant(Shingrix) 08/04/2018   Zoster, Live 01/19/2012    TDAP status: Up to date  Flu Vaccine status: Due, Education has been provided regarding the importance of this vaccine. Advised may receive this vaccine at local pharmacy or Health Dept. Aware to provide a copy of the vaccination record if obtained from local pharmacy or Health Dept. Verbalized acceptance and understanding.  Pneumococcal vaccine status: Up to date  Covid-19 vaccine status: Declined, Education has been provided regarding the importance of this vaccine but patient still declined. Advised may receive this vaccine at local pharmacy or Health Dept.or vaccine clinic. Aware to provide a copy of the vaccination record if obtained from local pharmacy or Health Dept. Verbalized acceptance and understanding.  Qualifies for Shingles Vaccine? Yes   Zostavax completed Yes   Shingrix Completed?: No.    Education has been provided regarding the importance of this vaccine. Patient has been advised to call insurance company to determine out of pocket expense if they have not yet received this vaccine. Advised may also receive vaccine at local pharmacy or Health Dept. Verbalized acceptance and understanding.  Screening Tests Health Maintenance  Topic Date Due   Zoster Vaccines- Shingrix (2 of 2) 09/29/2018   Medicare Annual Wellness (AWV)  06/10/2023   INFLUENZA VACCINE  06/25/2023   DTaP/Tdap/Td (2 - Td or Tdap) 11/08/2023   Pneumonia Vaccine 21+ Years old  Completed   DEXA SCAN  Completed   HPV VACCINES  Aged Out   MAMMOGRAM  Discontinued   COVID-19 Vaccine  Discontinued    Health  Maintenance  Health Maintenance Due  Topic Date Due   Zoster Vaccines- Shingrix (2 of 2) 09/29/2018   Medicare Annual Wellness (AWV)  06/10/2023   INFLUENZA VACCINE  06/25/2023    Colorectal cancer screening. No longer required  Mammogram status: No longer required due to age.  Bone Density status: Completed 5/16. Results reflect: Bone density results: NORMAL. Repeat every 2 years. Patient declines at this time  Lung Cancer Screening: (Low Dose CT Chest recommended if Age 29-80 years, 20 pack-year currently smoking OR have quit w/in 15years.) does not qualify.    Additional Screening:  Hepatitis C Screening: does not qualify; Completed  age  Vision Screening: Recommended annual ophthalmology exams for early detection of glaucoma and other disorders of the eye. Is the patient up to date with their annual eye exam?  Yes  Who is the provider or what is the name of the office in which the patient attends annual eye exams?  Dr. Larence Penning If pt is not established with a provider, would they like to be referred to a provider to establish care? No .   Dental Screening: Recommended annual dental exams for proper oral hygiene   Community Resource Referral / Chronic Care Management: CRR required this visit?  No   CCM required this visit?  No     Plan:     I have personally reviewed and noted the following in the patient's chart:   Medical and social history Use of alcohol, tobacco or illicit drugs  Current medications and supplements including opioid prescriptions. Patient is not currently taking opioid prescriptions. Functional ability and status Nutritional status Physical activity Advanced directives List of other physicians Hospitalizations, surgeries, and ER visits in previous 12 months Vitals Screenings to include cognitive, depression, and falls Referrals and appointments  In addition, I have reviewed and discussed with patient certain preventive protocols, quality  metrics, and best practice recommendations. A written personalized care plan for preventive services as well as general preventive health recommendations were provided to patient.     Sydell Axon, LPN   01/03/5620   After Visit Summary: (Declined) Due to this being a telephonic visit, with patients personalized plan was offered to patient but patient Declined AVS at this time   Nurse Notes: Patient was unable to review medications because her daughter takes care of them. Patient stated that nothing has changed with her medications since her last visit to the office.    I have reviewed the above information and agree with above.   Duncan Dull, MD

## 2023-06-26 NOTE — Patient Instructions (Signed)
Emily Wu , Thank you for taking time to come for your Medicare Wellness Visit. I appreciate your ongoing commitment to your health goals. Please review the following plan we discussed and let me know if I can assist you in the future.   Referrals/Orders/Follow-Ups/Clinician Recommendations: None  This is a list of the screening recommended for you and due dates:  Health Maintenance  Topic Date Due   Zoster (Shingles) Vaccine (2 of 2) 09/29/2018   Flu Shot  06/25/2023   DTaP/Tdap/Td vaccine (2 - Td or Tdap) 11/08/2023   Medicare Annual Wellness Visit  06/25/2024   Pneumonia Vaccine  Completed   DEXA scan (bone density measurement)  Completed   HPV Vaccine  Aged Out   Mammogram  Discontinued   COVID-19 Vaccine  Discontinued    Advanced directives: (In Chart) A copy of your advanced directives are scanned into your chart should your provider ever need it.  Next Medicare Annual Wellness Visit scheduled for next year: No Patient declined, will have daughter to call and schedule  Preventive Care 27 Years and Older, Female Preventive care refers to lifestyle choices and visits with your health care provider that can promote health and wellness. What does preventive care include? A yearly physical exam. This is also called an annual well check. Dental exams once or twice a year. Routine eye exams. Ask your health care provider how often you should have your eyes checked. Personal lifestyle choices, including: Daily care of your teeth and gums. Regular physical activity. Eating a healthy diet. Avoiding tobacco and drug use. Limiting alcohol use. Practicing safe sex. Taking low-dose aspirin every day. Taking vitamin and mineral supplements as recommended by your health care provider. What happens during an annual well check? The services and screenings done by your health care provider during your annual well check will depend on your age, overall health, lifestyle risk factors, and  family history of disease. Counseling  Your health care provider may ask you questions about your: Alcohol use. Tobacco use. Drug use. Emotional well-being. Home and relationship well-being. Sexual activity. Eating habits. History of falls. Memory and ability to understand (cognition). Work and work Astronomer. Reproductive health. Screening  You may have the following tests or measurements: Height, weight, and BMI. Blood pressure. Lipid and cholesterol levels. These may be checked every 5 years, or more frequently if you are over 66 years old. Skin check. Lung cancer screening. You may have this screening every year starting at age 15 if you have a 30-pack-year history of smoking and currently smoke or have quit within the past 15 years. Fecal occult blood test (FOBT) of the stool. You may have this test every year starting at age 78. Flexible sigmoidoscopy or colonoscopy. You may have a sigmoidoscopy every 5 years or a colonoscopy every 10 years starting at age 3. Hepatitis C blood test. Hepatitis B blood test. Sexually transmitted disease (STD) testing. Diabetes screening. This is done by checking your blood sugar (glucose) after you have not eaten for a while (fasting). You may have this done every 1-3 years. Bone density scan. This is done to screen for osteoporosis. You may have this done starting at age 30. Mammogram. This may be done every 1-2 years. Talk to your health care provider about how often you should have regular mammograms. Talk with your health care provider about your test results, treatment options, and if necessary, the need for more tests. Vaccines  Your health care provider may recommend certain vaccines, such as:  Influenza vaccine. This is recommended every year. Tetanus, diphtheria, and acellular pertussis (Tdap, Td) vaccine. You may need a Td booster every 10 years. Zoster vaccine. You may need this after age 46. Pneumococcal 13-valent conjugate  (PCV13) vaccine. One dose is recommended after age 66. Pneumococcal polysaccharide (PPSV23) vaccine. One dose is recommended after age 92. Talk to your health care provider about which screenings and vaccines you need and how often you need them. This information is not intended to replace advice given to you by your health care provider. Make sure you discuss any questions you have with your health care provider. Document Released: 12/07/2015 Document Revised: 07/30/2016 Document Reviewed: 09/11/2015 Elsevier Interactive Patient Education  2017 [employer].  Fall Prevention in the Home Falls can cause injuries. They can happen to people of all ages. There are many things you can do to make your home safe and to help prevent falls. What can I do on the outside of my home? Regularly fix the edges of walkways and driveways and fix any cracks. Remove anything that might make you trip as you walk through a door, such as a raised step or threshold. Trim any bushes or trees on the path to your home. Use bright outdoor lighting. Clear any walking paths of anything that might make someone trip, such as rocks or tools. Regularly check to see if handrails are loose or broken. Make sure that both sides of any steps have handrails. Any raised decks and porches should have guardrails on the edges. Have any leaves, snow, or ice cleared regularly. Use sand or salt on walking paths during winter. Clean up any spills in your garage right away. This includes oil or grease spills. What can I do in the bathroom? Use night lights. Install grab bars by the toilet and in the tub and shower. Do not use towel bars as grab bars. Use non-skid mats or decals in the tub or shower. If you need to sit down in the shower, use a plastic, non-slip stool. Keep the floor dry. Clean up any water that spills on the floor as soon as it happens. Remove soap buildup in the tub or shower regularly. Attach bath mats securely with  double-sided non-slip rug tape. Do not have throw rugs and other things on the floor that can make you trip. What can I do in the bedroom? Use night lights. Make sure that you have a light by your bed that is easy to reach. Do not use any sheets or blankets that are too big for your bed. They should not hang down onto the floor. Have a firm chair that has side arms. You can use this for support while you get dressed. Do not have throw rugs and other things on the floor that can make you trip. What can I do in the kitchen? Clean up any spills right away. Avoid walking on wet floors. Keep items that you use a lot in easy-to-reach places. If you need to reach something above you, use a strong step stool that has a grab bar. Keep electrical cords out of the way. Do not use floor polish or wax that makes floors slippery. If you must use wax, use non-skid floor wax. Do not have throw rugs and other things on the floor that can make you trip. What can I do with my stairs? Do not leave any items on the stairs. Make sure that there are handrails on both sides of the stairs and use them.  Fix handrails that are broken or loose. Make sure that handrails are as long as the stairways. Check any carpeting to make sure that it is firmly attached to the stairs. Fix any carpet that is loose or worn. Avoid having throw rugs at the top or bottom of the stairs. If you do have throw rugs, attach them to the floor with carpet tape. Make sure that you have a light switch at the top of the stairs and the bottom of the stairs. If you do not have them, ask someone to add them for you. What else can I do to help prevent falls? Wear shoes that: Do not have high heels. Have rubber bottoms. Are comfortable and fit you well. Are closed at the toe. Do not wear sandals. If you use a stepladder: Make sure that it is fully opened. Do not climb a closed stepladder. Make sure that both sides of the stepladder are locked  into place. Ask someone to hold it for you, if possible. Clearly mark and make sure that you can see: Any grab bars or handrails. First and last steps. Where the edge of each step is. Use tools that help you move around (mobility aids) if they are needed. These include: Canes. Walkers. Scooters. Crutches. Turn on the lights when you go into a dark area. Replace any light bulbs as soon as they burn out. Set up your furniture so you have a clear path. Avoid moving your furniture around. If any of your floors are uneven, fix them. If there are any pets around you, be aware of where they are. Review your medicines with your doctor. Some medicines can make you feel dizzy. This can increase your chance of falling. Ask your doctor what other things that you can do to help prevent falls. This information is not intended to replace advice given to you by your health care provider. Make sure you discuss any questions you have with your health care provider. Document Released: 09/06/2009 Document Revised: 04/17/2016 Document Reviewed: 12/15/2014 Elsevier Interactive Patient Education  2017 [employer].

## 2023-07-28 DIAGNOSIS — F5104 Psychophysiologic insomnia: Secondary | ICD-10-CM | POA: Diagnosis not present

## 2023-07-28 DIAGNOSIS — F0280 Dementia in other diseases classified elsewhere without behavioral disturbance: Secondary | ICD-10-CM | POA: Diagnosis not present

## 2023-07-28 DIAGNOSIS — R2689 Other abnormalities of gait and mobility: Secondary | ICD-10-CM | POA: Diagnosis not present

## 2023-07-28 DIAGNOSIS — F0150 Vascular dementia without behavioral disturbance: Secondary | ICD-10-CM | POA: Diagnosis not present

## 2023-07-28 DIAGNOSIS — G309 Alzheimer's disease, unspecified: Secondary | ICD-10-CM | POA: Diagnosis not present

## 2023-07-28 DIAGNOSIS — F411 Generalized anxiety disorder: Secondary | ICD-10-CM | POA: Diagnosis not present

## 2023-08-19 ENCOUNTER — Ambulatory Visit: Payer: Medicare HMO | Admitting: Nurse Practitioner

## 2023-08-21 ENCOUNTER — Ambulatory Visit (INDEPENDENT_AMBULATORY_CARE_PROVIDER_SITE_OTHER): Payer: Medicare HMO | Admitting: Nurse Practitioner

## 2023-08-21 ENCOUNTER — Encounter: Payer: Self-pay | Admitting: Nurse Practitioner

## 2023-08-21 VITALS — BP 120/60 | HR 62 | Temp 97.8°F | Ht 62.0 in | Wt 124.0 lb

## 2023-08-21 DIAGNOSIS — R238 Other skin changes: Secondary | ICD-10-CM

## 2023-08-21 DIAGNOSIS — R195 Other fecal abnormalities: Secondary | ICD-10-CM | POA: Diagnosis not present

## 2023-08-21 MED ORDER — NYSTATIN-TRIAMCINOLONE 100000-0.1 UNIT/GM-% EX OINT: 1.0000 | TOPICAL_OINTMENT | Freq: Two times a day (BID) | CUTANEOUS | 0 refills | Status: AC

## 2023-08-21 NOTE — Patient Instructions (Addendum)
Apply the cream twice daily for 5 days. If symptoms not improving call the office.

## 2023-08-21 NOTE — Progress Notes (Unsigned)
Established Patient Office Visit  Subjective:  Patient ID: Emily Wu, female    DOB: 11-03-1931  Age: 89 y.o. MRN: [id number]  CC:  Chief Complaint  Patient presents with   Abdominal Pain    Belly button     HPI  Emily Wu presents for redness on the belly bottom area. She lives by herself and the aide visit often.  She state has noticed some redness and itching to the umbilical area from few days.  She denise any pain to the area. States had cleaned the area with Q-tip.   She also reports dark stool from 3-4 months. Denise any abdominal pain, change in diet.   HPI   Past Medical History:  Diagnosis Date   Arthritis    "knees, shoulders, chest" (02/03/2014)   Bruises easily    Cervical cancer [facility]) 1965   Concussion 03/11/2020   High cholesterol    History of kidney stones    Hypertension    DENIES    Tendency toward bleeding easily [facility])     Past Surgical History:  Procedure Laterality Date   ABDOMINAL HYSTERECTOMY  1965   APPENDECTOMY     CHOLECYSTECTOMY     KNEE ARTHROSCOPY Left    [facility]   PARTIAL KNEE ARTHROPLASTY Left 01/26/2019   Procedure: UNICOMPARTMENTAL KNEE;  Surgeon: Teryl Lucy, MD;  Location: [facility] OR;  Service: Orthopedics;  Laterality: Left;   PARTIAL KNEE ARTHROPLASTY Right 12/10/2021   Procedure: UNICOMPARTMENTAL KNEE;  Surgeon: Teryl Lucy, MD;  Location: [facility] ORS;  Service: Orthopedics;  Laterality: Right;   SHOULDER OPEN ROTATOR CUFF REPAIR Bilateral 2007,  2010   [facility]   THROAT SURGERY  2014   "arthritis hugh knot" (02/03/2014)    Family History  Problem Relation Age of Onset   Peripheral vascular disease Mother    Stroke Father    Hyperlipidemia Father    Heart disease Father 34   Cancer Neg Hx     Social History   Socioeconomic History   Marital status: Married    Spouse name: Not on file   Number of children: Not on file   Years of education: Not on file   Highest education level: Not on file  Occupational History    Not on file  Tobacco Use   Smoking status: Former    Current packs/day: 0.00    Average packs/day: 2.0 packs/day for 7.0 years (14.0 ttl pk-yrs)    Types: Cigarettes    Start date: 67    Quit date: 28    Years since quitting: 64.7   Smokeless tobacco: Never   Tobacco comments:    02/03/2014 "quit smoking in the 1960's"  Vaping Use   Vaping status: Never Used  Substance and Sexual Activity   Alcohol use: No   Drug use: No   Sexual activity: Never  Other Topics Concern   Not on file  Social History Narrative   Widowed   Has a caregiver 6 days a week   Social Determinants of Health   Financial Resource Strain: Low Risk  (06/26/2023)   Overall Financial Resource Strain (CARDIA)    Difficulty of Paying Living Expenses: Not hard at all  Food Insecurity: No Food Insecurity (06/26/2023)   Hunger Vital Sign    Worried About Running Out of Food in the Last Year: Never true    Ran Out of Food in the Last Year: Never true  Transportation Needs: No Transportation Needs (06/26/2023)   PRAPARE - Transportation  Lack of Transportation (Medical): No    Lack of Transportation (Non-Medical): No  Physical Activity: Inactive (06/26/2023)   Exercise Vital Sign    Days of Exercise per Week: 0 days    Minutes of Exercise per Session: 0 min  Stress: No Stress Concern Present (06/26/2023)   [employer] of Occupational Health - Occupational Stress Questionnaire    Feeling of Stress : Only a little  Social Connections: Socially Isolated (06/26/2023)   Social Connection and Isolation Panel [NHANES]    Frequency of Communication with Friends and Family: More than three times a week    Frequency of Social Gatherings with Friends and Family: More than three times a week    Attends Religious Services: Never    Database administrator or Organizations: No    Attends Banker Meetings: Never    Marital Status: Widowed  Intimate Partner Violence: Not At Risk (06/26/2023)   Humiliation,  Afraid, Rape, and Kick questionnaire    Fear of Current or Ex-Partner: No    Emotionally Abused: No    Physically Abused: No    Sexually Abused: No     Outpatient Medications Prior to Visit  Medication Sig Dispense Refill   ACETAMINOPHEN ER PO Take 500 mg by mouth daily.     diphenoxylate-atropine (LOMOTIL) 2.5-0.025 MG tablet Take 1 tablet each morning with breakfast.  You may take an additional tablet during the day with each loose stool for a maximum of 4 tablets per day.     docusate sodium (COLACE) 100 MG capsule Take 1 capsule (100 mg total) by mouth 2 (two) times daily. To prevent constipation while taking pain medication. 30 capsule 0   donepezil (ARICEPT) 10 MG tablet Take 10 mg by mouth at bedtime.     ipratropium (ATROVENT) 0.06 % nasal spray Place 2 sprays into both nostrils 4 (four) times daily. FOR RUNNY NOSE 15 mL 12   liothyronine (CYTOMEL) 5 MCG tablet TAKE 1 TABLET BY MOUTH DAILY 90 tablet 1   mirtazapine (REMERON) 30 MG tablet TAKE ONE TABLET (30 MG) BY MOUTH AT BEDTIME 90 tablet 0   rosuvastatin (CRESTOR) 10 MG tablet TAKE 1 TABLET BY MOUTH DAILY 90 tablet 3   vitamin B-12 (CYANOCOBALAMIN) 1000 MCG tablet Take 1,000 mcg by mouth daily.     No facility-administered medications prior to visit.    Allergies  Allergen Reactions   Tramadol Anaphylaxis and Other (See Comments)    Lowered Blood Pressure   Amoxicillin Rash   Promethazine Hcl Rash   Sulfa Drugs Cross Reactors Rash    ROS Review of Systems Negative unless indicated in HPI.    Objective:    Physical Exam Constitutional:      Appearance: Normal appearance.  Cardiovascular:     Rate and Rhythm: Normal rate and regular rhythm.     Pulses: Normal pulses.     Heart sounds: Normal heart sounds.  Pulmonary:     Effort: Pulmonary effort is normal.  Abdominal:     General: Abdomen is flat and scaphoid. Bowel sounds are normal.     Palpations: Abdomen is soft.     Tenderness: There is no abdominal  tenderness. There is no right CVA tenderness or left CVA tenderness.     Hernia: No hernia is present.     Comments: Slight erythema to the navel area  Musculoskeletal:     Cervical back: Normal range of motion.  Neurological:     General: No focal deficit present.  Mental Status: She is alert. Mental status is at baseline.  Psychiatric:        Mood and Affect: Mood normal.        Behavior: Behavior normal.        Thought Content: Thought content normal.        Judgment: Judgment normal.     BP 120/60   Pulse 62   Temp 97.8 F (36.6 C) (Oral)   Ht 5\' 2"  (1.575 m)   Wt 124 lb (56.2 kg)   SpO2 97%   BMI 22.68 kg/m  Wt Readings from Last 3 Encounters:  08/21/23 124 lb (56.2 kg)  06/26/23 120 lb (54.4 kg)  03/18/23 125 lb 6.4 oz (56.9 kg)     Health Maintenance  Topic Date Due   Zoster Vaccines- Shingrix (2 of 2) 09/29/2018   INFLUENZA VACCINE  06/25/2023   DTaP/Tdap/Td (2 - Td or Tdap) 11/08/2023   Medicare Annual Wellness (AWV)  06/25/2024   Pneumonia Vaccine 19+ Years old  Completed   DEXA SCAN  Completed   HPV VACCINES  Aged Out   MAMMOGRAM  Discontinued   COVID-19 Vaccine  Discontinued    There are no preventive care reminders to display for this patient.  Lab Results  Component Value Date   TSH 3.28 03/18/2023   Lab Results  Component Value Date   WBC 7.4 03/18/2023   HGB 13.7 03/18/2023   HCT 41.2 03/18/2023   MCV 94.8 03/18/2023   PLT 252.0 03/18/2023   Lab Results  Component Value Date   NA 142 03/18/2023   K 4.2 03/18/2023   CO2 26 03/18/2023   GLUCOSE 82 03/18/2023   BUN 16 03/18/2023   CREATININE 0.97 03/18/2023   BILITOT 0.3 03/18/2023   ALKPHOS 61 03/18/2023   AST 21 03/18/2023   ALT 16 03/18/2023   PROT 6.3 03/18/2023   ALBUMIN 4.0 03/18/2023   CALCIUM 9.3 03/18/2023   ANIONGAP 7 12/11/2021   GFR 51.69 (L) 03/18/2023   Lab Results  Component Value Date   CHOL 165 03/18/2023   Lab Results  Component Value Date   HDL 31.20  (L) 03/18/2023   Lab Results  Component Value Date   LDLCALC 73 03/19/2018   Lab Results  Component Value Date   TRIG 325.0 (H) 03/18/2023   Lab Results  Component Value Date   CHOLHDL 5 03/18/2023   Lab Results  Component Value Date   HGBA1C 5.7 03/18/2023      Assessment & Plan:  Skin irritation Assessment & Plan: Slight pink irritation to the navel area. Advised pt to keep the area clean and use nystatin triamcinolone ointment 2-3 times a day. If symptoms not improving please call the office.    Dark stools Assessment & Plan: Will check for occult blood.   Orders: -     Fecal occult blood, imunochemical; Future  Other orders -     Nystatin-Triamcinolone; Apply 1 Application topically 2 (two) times daily.  Dispense: 30 g; Refill: 0    Follow-up: Return if symptoms worsen or fail to improve.   Kara Dies, NP

## 2023-08-24 DIAGNOSIS — R195 Other fecal abnormalities: Secondary | ICD-10-CM | POA: Insufficient documentation

## 2023-08-24 DIAGNOSIS — R238 Other skin changes: Secondary | ICD-10-CM | POA: Insufficient documentation

## 2023-08-24 NOTE — Assessment & Plan Note (Signed)
Will check for occult blood.

## 2023-08-24 NOTE — Assessment & Plan Note (Signed)
Slight pink irritation to the navel area. Advised pt to keep the area clean and use nystatin triamcinolone ointment 2-3 times a day. If symptoms not improving please call the office.

## 2023-08-25 DIAGNOSIS — D2271 Melanocytic nevi of right lower limb, including hip: Secondary | ICD-10-CM | POA: Diagnosis not present

## 2023-08-25 DIAGNOSIS — D2261 Melanocytic nevi of right upper limb, including shoulder: Secondary | ICD-10-CM | POA: Diagnosis not present

## 2023-08-25 DIAGNOSIS — Z08 Encounter for follow-up examination after completed treatment for malignant neoplasm: Secondary | ICD-10-CM | POA: Diagnosis not present

## 2023-08-25 DIAGNOSIS — D225 Melanocytic nevi of trunk: Secondary | ICD-10-CM | POA: Diagnosis not present

## 2023-08-25 DIAGNOSIS — Z85828 Personal history of other malignant neoplasm of skin: Secondary | ICD-10-CM | POA: Diagnosis not present

## 2023-08-25 DIAGNOSIS — L821 Other seborrheic keratosis: Secondary | ICD-10-CM | POA: Diagnosis not present

## 2023-08-25 DIAGNOSIS — D2272 Melanocytic nevi of left lower limb, including hip: Secondary | ICD-10-CM | POA: Diagnosis not present

## 2023-08-25 DIAGNOSIS — D2262 Melanocytic nevi of left upper limb, including shoulder: Secondary | ICD-10-CM | POA: Diagnosis not present

## 2023-09-09 DIAGNOSIS — S161XXA Strain of muscle, fascia and tendon at neck level, initial encounter: Secondary | ICD-10-CM | POA: Diagnosis not present

## 2023-09-09 DIAGNOSIS — S40012A Contusion of left shoulder, initial encounter: Secondary | ICD-10-CM | POA: Diagnosis not present

## 2023-09-09 DIAGNOSIS — S20212A Contusion of left front wall of thorax, initial encounter: Secondary | ICD-10-CM | POA: Diagnosis not present

## 2023-09-23 ENCOUNTER — Ambulatory Visit: Payer: Medicare HMO | Admitting: Internal Medicine

## 2023-09-23 ENCOUNTER — Encounter: Payer: Self-pay | Admitting: Internal Medicine

## 2023-09-23 VITALS — BP 102/58 | HR 71 | Ht 62.0 in | Wt 129.8 lb

## 2023-09-23 DIAGNOSIS — I700 Atherosclerosis of aorta: Secondary | ICD-10-CM

## 2023-09-23 DIAGNOSIS — E034 Atrophy of thyroid (acquired): Secondary | ICD-10-CM | POA: Diagnosis not present

## 2023-09-23 DIAGNOSIS — R195 Other fecal abnormalities: Secondary | ICD-10-CM

## 2023-09-23 DIAGNOSIS — G309 Alzheimer's disease, unspecified: Secondary | ICD-10-CM | POA: Diagnosis not present

## 2023-09-23 DIAGNOSIS — R296 Repeated falls: Secondary | ICD-10-CM

## 2023-09-23 DIAGNOSIS — M40209 Unspecified kyphosis, site unspecified: Secondary | ICD-10-CM | POA: Diagnosis not present

## 2023-09-23 DIAGNOSIS — N1831 Chronic kidney disease, stage 3a: Secondary | ICD-10-CM

## 2023-09-23 DIAGNOSIS — F0280 Dementia in other diseases classified elsewhere without behavioral disturbance: Secondary | ICD-10-CM

## 2023-09-23 DIAGNOSIS — F0150 Vascular dementia without behavioral disturbance: Secondary | ICD-10-CM

## 2023-09-23 DIAGNOSIS — E7800 Pure hypercholesterolemia, unspecified: Secondary | ICD-10-CM

## 2023-09-23 LAB — LIPID PANEL
Cholesterol: 168 mg/dL (ref 0–200)
HDL: 32 mg/dL — ABNORMAL LOW (ref >39.00)
LDL Cholesterol: 84 mg/dL (ref 0–99)
NonHDL: 135.76
Total CHOL/HDL Ratio: 5
Triglycerides: 258 mg/dL — ABNORMAL HIGH (ref 0.0–149.0)
VLDL: 51.6 mg/dL — ABNORMAL HIGH (ref 0.0–40.0)

## 2023-09-23 LAB — COMPREHENSIVE METABOLIC PANEL
ALT: 12 U/L (ref 0–35)
AST: 17 U/L (ref 0–37)
Albumin: 3.9 g/dL (ref 3.5–5.2)
Alkaline Phosphatase: 68 U/L (ref 39–117)
BUN: 15 mg/dL (ref 6–23)
CO2: 31 meq/L (ref 19–32)
Calcium: 9.5 mg/dL (ref 8.4–10.5)
Chloride: 105 meq/L (ref 96–112)
Creatinine, Ser: 1.08 mg/dL (ref 0.40–1.20)
GFR: 45.27 mL/min — ABNORMAL LOW (ref >60.00)
Glucose, Bld: 84 mg/dL (ref 70–99)
Potassium: 4.3 meq/L (ref 3.5–5.1)
Sodium: 142 meq/L (ref 135–145)
Total Bilirubin: 0.3 mg/dL (ref 0.2–1.2)
Total Protein: 6.7 g/dL (ref 6.0–8.3)

## 2023-09-23 LAB — CBC WITH DIFFERENTIAL/PLATELET
Basophils Absolute: 0.1 10*3/uL (ref 0.0–0.1)
Basophils Relative: 0.9 % (ref 0.0–3.0)
Eosinophils Absolute: 0.1 10*3/uL (ref 0.0–0.7)
Eosinophils Relative: 1.4 % (ref 0.0–5.0)
HCT: 40.2 % (ref 36.0–46.0)
Hemoglobin: 13 g/dL (ref 12.0–15.0)
Lymphocytes Relative: 41.5 % (ref 12.0–46.0)
Lymphs Abs: 2.5 10*3/uL (ref 0.7–4.0)
MCHC: 32.5 g/dL (ref 30.0–36.0)
MCV: 95.8 fL (ref 78.0–100.0)
Monocytes Absolute: 0.6 10*3/uL (ref 0.1–1.0)
Monocytes Relative: 9.7 % (ref 3.0–12.0)
Neutro Abs: 2.8 10*3/uL (ref 1.4–7.7)
Neutrophils Relative %: 46.5 % (ref 43.0–77.0)
Platelets: 309 10*3/uL (ref 150.0–400.0)
RBC: 4.19 Mil/uL (ref 3.87–5.11)
RDW: 13.9 % (ref 11.5–15.5)
WBC: 6.1 10*3/uL (ref 4.0–10.5)

## 2023-09-23 LAB — LDL CHOLESTEROL, DIRECT: Direct LDL: 83 mg/dL

## 2023-09-23 LAB — TSH: TSH: 2.77 u[IU]/mL (ref 0.35–5.50)

## 2023-09-23 MED ORDER — LIOTHYRONINE SODIUM 5 MCG PO TABS: 5.0000 ug | ORAL_TABLET | Freq: Every day | ORAL | 1 refills | Status: DC

## 2023-09-23 MED ORDER — MIRTAZAPINE 30 MG PO TABS: ORAL_TABLET | ORAL | 0 refills | Status: DC

## 2023-09-23 NOTE — Patient Instructions (Signed)
I have made a referral to [facility] office on [address]  to work on your balance and leg strength   Please do the home stool test that Jasmine December gave you last month and return it to our lab .  This test was ordered because you mentioned that your stools were "dark" so we are testing for blood

## 2023-09-23 NOTE — Progress Notes (Unsigned)
Subjective:  Patient ID: Emily Wu, female    DOB: 06-01-1931  Age: 89 y.o. MRN: [id number]  CC: The primary encounter diagnosis was Hypothyroidism due to acquired atrophy of thyroid. Diagnoses of Pure hypercholesterolemia, Dark stools, Frequent falls, Acquired kyphosis, Stage 3a chronic kidney disease (HCC), Aortic atherosclerosis (HCC), and Mixed Alzheimer's and vascular dementia (HCC) were also pertinent to this visit.   HPI Emily Wu presents for  Chief Complaint  Patient presents with   Medical Management of Chronic Issues    6 month follow up     1) underweight:  has gained 5 lbs since last visit   2) dementia:  mixed alzheimers/vascular.  Cognitive deficits have not progressed   3) Frequent falls:  2 in the same week ;  first one while outside on pavement   skinned her right elbow .  2nd one occurred  while trying  to boost herself   into a high bed  while staying at daughter's house.  She fell backward and hit her head on the left.  while standing in courtyard, went  to urgent care and had x rays of neck and shoulder.  4) Chronic recurrent neck and back pain : has scoliosis and kyphosis . Uses a cane out of the home and uses her walker getting in and out of the shower .acupuncture has been resumed  .  Discussed PT she has agreed    Outpatient Medications Prior to Visit  Medication Sig Dispense Refill   ACETAMINOPHEN ER PO Take 500 mg by mouth daily.     diphenoxylate-atropine (LOMOTIL) 2.5-0.025 MG tablet Take 1 tablet each morning with breakfast.  You may take an additional tablet during the day with each loose stool for a maximum of 4 tablets per day.     docusate sodium (COLACE) 100 MG capsule Take 1 capsule (100 mg total) by mouth 2 (two) times daily. To prevent constipation while taking pain medication. 30 capsule 0   donepezil (ARICEPT) 10 MG tablet Take 10 mg by mouth at bedtime.     ibuprofen (ADVIL) 600 MG tablet Take 600 mg by mouth every 6 (six) hours  as needed.     ipratropium (ATROVENT) 0.06 % nasal spray Place 2 sprays into both nostrils 4 (four) times daily. FOR RUNNY NOSE 15 mL 12   nystatin-triamcinolone ointment (MYCOLOG) Apply 1 Application topically 2 (two) times daily. 30 g 0   rosuvastatin (CRESTOR) 10 MG tablet TAKE 1 TABLET BY MOUTH DAILY 90 tablet 3   tiZANidine (ZANAFLEX) 4 MG tablet Take 4 mg by mouth every 6 (six) hours as needed.     vitamin B-12 (CYANOCOBALAMIN) 1000 MCG tablet Take 1,000 mcg by mouth daily.     liothyronine (CYTOMEL) 5 MCG tablet TAKE 1 TABLET BY MOUTH DAILY 90 tablet 1   mirtazapine (REMERON) 30 MG tablet TAKE ONE TABLET (30 MG) BY MOUTH AT BEDTIME 90 tablet 0   No facility-administered medications prior to visit.    Review of Systems;  Patient denies headache, fevers, malaise, unintentional weight loss, skin rash, eye pain, sinus congestion and sinus pain, sore throat, dysphagia,  hemoptysis , cough, dyspnea, wheezing, chest pain, palpitations, orthopnea, edema, abdominal pain, nausea, melena, diarrhea, constipation, flank pain, dysuria, hematuria, urinary  Frequency, nocturia, numbness, tingling, seizures,  Focal weakness, Loss of consciousness,  Tremor, insomnia, depression, anxiety, and suicidal ideation.      Objective:  BP (!) 102/58   Pulse 71   Ht 5\' 2"  (1.575 m)  Wt 129 lb 12.8 oz (58.9 kg)   SpO2 97%   BMI 23.74 kg/m   BP Readings from Last 3 Encounters:  09/23/23 (!) 102/58  08/21/23 120/60  03/18/23 118/72    Wt Readings from Last 3 Encounters:  09/23/23 129 lb 12.8 oz (58.9 kg)  08/21/23 124 lb (56.2 kg)  06/26/23 120 lb (54.4 kg)    Physical Exam Vitals reviewed.  Constitutional:      General: She is not in acute distress.    Appearance: She is normal weight. She is not ill-appearing, toxic-appearing or diaphoretic.     Comments: Kyphotic. Severe   HENT:     Head: Normocephalic.  Eyes:     General: No scleral icterus.       Right eye: No discharge.        Left  eye: No discharge.     Conjunctiva/sclera: Conjunctivae normal.  Cardiovascular:     Rate and Rhythm: Normal rate and regular rhythm.     Heart sounds: Normal heart sounds.  Pulmonary:     Effort: Pulmonary effort is normal. No respiratory distress.     Breath sounds: Normal breath sounds.  Musculoskeletal:        General: Normal range of motion.  Skin:    General: Skin is warm and dry.  Neurological:     General: No focal deficit present.     Mental Status: She is alert and oriented to person, place, and time. Mental status is at baseline.     Cranial Nerves: No cranial nerve deficit.     Motor: Weakness present.     Coordination: Coordination normal.     Deep Tendon Reflexes: Reflexes normal.     Comments: Trouble standing on one leg   Psychiatric:        Mood and Affect: Mood normal.        Behavior: Behavior normal.        Thought Content: Thought content normal.        Judgment: Judgment normal.    Lab Results  Component Value Date   HGBA1C 5.7 03/18/2023   HGBA1C 5.7 09/11/2021   HGBA1C 5.8 08/18/2019    Lab Results  Component Value Date   CREATININE 1.08 09/23/2023   CREATININE 0.97 03/18/2023   CREATININE 0.97 12/11/2021    Lab Results  Component Value Date   WBC 6.1 09/23/2023   HGB 13.0 09/23/2023   HCT 40.2 09/23/2023   PLT 309.0 09/23/2023   GLUCOSE 84 09/23/2023   CHOL 168 09/23/2023   TRIG 258.0 (H) 09/23/2023   HDL 32.00 (L) 09/23/2023   LDLDIRECT 83.0 09/23/2023   LDLCALC 84 09/23/2023   ALT 12 09/23/2023   AST 17 09/23/2023   NA 142 09/23/2023   K 4.3 09/23/2023   CL 105 09/23/2023   CREATININE 1.08 09/23/2023   BUN 15 09/23/2023   CO2 31 09/23/2023   TSH 2.77 09/23/2023   INR 1.04 12/08/2018   HGBA1C 5.7 03/18/2023   MICROALBUR 1.1 03/20/2023    DG Knee Right Port  Result Date: 12/10/2021 CLINICAL DATA:  Partial knee replacement EXAM: PORTABLE RIGHT KNEE - 1-2 VIEW COMPARISON:  06/08/2019 FINDINGS: Partial right knee replacement  of the medial joint space. Prosthesis in satisfactory position. No fracture or complication. Degenerative change in the lateral joint and patellofemoral joint. Fluid and gas in the joint. IMPRESSION: Medial joint replacement right knee. Electronically Signed   By: Marlan Palau M.D.   On: 12/10/2021 17:05  Assessment & Plan:  .Hypothyroidism due to acquired atrophy of thyroid Assessment & Plan: Managed with cytomel.  TSH is normal  Lab Results  Component Value Date   TSH 2.77 09/23/2023     Orders: -     TSH  Pure hypercholesterolemia -     Lipid panel -     LDL cholesterol, direct -     Comprehensive metabolic panel  Dark stools Assessment & Plan: FOBT ordered   Orders: -     CBC with Differential/Platelet  Frequent falls Assessment & Plan: Referring to PT for balance and strength training   Orders: -     Ambulatory referral to Physical Therapy  Acquired kyphosis Assessment & Plan: She has severe /advanced thoracolumbar kyphosis and degenerative changes . She is using a 4 footed walker in the house and a cane outside of the house.    Stage 3a chronic kidney disease (HCC) Assessment & Plan: GFR is stable but < 50 ml/min  Lab Results  Component Value Date   CREATININE 1.08 09/23/2023      Aortic atherosclerosis (HCC) Assessment & Plan: Reviewed findings of prior CT scan today..  Patient is tolerating high potency statin therapy     Mixed Alzheimer's and vascular dementia (HCC) Assessment & Plan: No progression based on today's exam. She has discontinued Aricept as it aggrvated her diarrhea. .  She has a daytime health aide and her daughter Zella Ball manages her medications   Other orders -     Liothyronine Sodium; Take 1 tablet (5 mcg total) by mouth daily.  Dispense: 90 tablet; Refill: 1 -     Mirtazapine; TAKE ONE TABLET (30 MG) BY MOUTH AT BEDTIME  Dispense: 90 tablet; Refill: 0    Follow-up: Return in about 6 months (around 03/23/2024).   Sherlene Shams, MD

## 2023-09-24 DIAGNOSIS — R296 Repeated falls: Secondary | ICD-10-CM | POA: Insufficient documentation

## 2023-09-24 NOTE — Assessment & Plan Note (Signed)
GFR is stable but < 50 ml/min  Lab Results  Component Value Date   CREATININE 1.08 09/23/2023

## 2023-09-24 NOTE — Assessment & Plan Note (Signed)
Referring to PT for balance and strength training

## 2023-09-24 NOTE — Assessment & Plan Note (Signed)
Managed with cytomel.  TSH is normal  Lab Results  Component Value Date   TSH 2.77 09/23/2023

## 2023-09-24 NOTE — Assessment & Plan Note (Signed)
FOBT ordered.

## 2023-09-24 NOTE — Assessment & Plan Note (Signed)
Reviewed findings of prior CT scan today..  Patient is tolerating high potency statin therapy  

## 2023-09-24 NOTE — Assessment & Plan Note (Signed)
No progression based on today's exam. She has discontinued Aricept as it aggrvated her diarrhea. .  She has a daytime health aide and her daughter Zella Ball manages her medications

## 2023-09-24 NOTE — Assessment & Plan Note (Signed)
She has severe /advanced thoracolumbar kyphosis and degenerative changes . She is using a 4 footed walker in the house and a cane outside of the house.

## 2023-09-28 ENCOUNTER — Telehealth: Payer: Self-pay

## 2023-09-28 NOTE — Telephone Encounter (Signed)
Lvm for pt to give office a call back in regards to labs  see message below

## 2023-09-28 NOTE — Telephone Encounter (Signed)
-----   Message from Sherlene Shams sent at 09/27/2023  8:13 PM EST ----- Labs are stable. No changes to meds

## 2023-10-15 ENCOUNTER — Ambulatory Visit: Payer: Medicare HMO

## 2023-10-15 DIAGNOSIS — R195 Other fecal abnormalities: Secondary | ICD-10-CM | POA: Diagnosis not present

## 2023-10-15 LAB — FECAL OCCULT BLOOD, IMMUNOCHEMICAL: Fecal Occult Bld: NEGATIVE

## 2023-10-21 ENCOUNTER — Telehealth: Payer: Self-pay

## 2023-10-21 NOTE — Telephone Encounter (Signed)
Tried calling pt unable to leave vm on either number listed. Letter mailed out for pt.

## 2023-10-21 NOTE — Telephone Encounter (Signed)
-----   Message from Eulis Foster sent at 10/15/2023  9:30 PM EST ----- No blood identified in the stool.

## 2023-10-28 ENCOUNTER — Encounter: Payer: Self-pay | Admitting: Family

## 2023-10-28 ENCOUNTER — Ambulatory Visit (INDEPENDENT_AMBULATORY_CARE_PROVIDER_SITE_OTHER): Payer: Medicare HMO | Admitting: Family

## 2023-10-28 ENCOUNTER — Telehealth: Payer: Self-pay | Admitting: Family

## 2023-10-28 VITALS — BP 122/70 | HR 75 | Temp 97.9°F | Ht 62.0 in | Wt 126.8 lb

## 2023-10-28 DIAGNOSIS — B09 Unspecified viral infection characterized by skin and mucous membrane lesions: Secondary | ICD-10-CM | POA: Diagnosis not present

## 2023-10-28 DIAGNOSIS — J029 Acute pharyngitis, unspecified: Secondary | ICD-10-CM | POA: Diagnosis not present

## 2023-10-28 DIAGNOSIS — J069 Acute upper respiratory infection, unspecified: Secondary | ICD-10-CM | POA: Insufficient documentation

## 2023-10-28 DIAGNOSIS — R059 Cough, unspecified: Secondary | ICD-10-CM | POA: Diagnosis not present

## 2023-10-28 DIAGNOSIS — L03316 Cellulitis of umbilicus: Secondary | ICD-10-CM | POA: Diagnosis not present

## 2023-10-28 LAB — CBC WITH DIFFERENTIAL/PLATELET
Basophils Absolute: 0.1 10*3/uL (ref 0.0–0.1)
Basophils Relative: 0.8 % (ref 0.0–3.0)
Eosinophils Absolute: 0.1 10*3/uL (ref 0.0–0.7)
Eosinophils Relative: 1 % (ref 0.0–5.0)
HCT: 41.2 % (ref 36.0–46.0)
Hemoglobin: 13.6 g/dL (ref 12.0–15.0)
Lymphocytes Relative: 21.8 % (ref 12.0–46.0)
Lymphs Abs: 2.2 10*3/uL (ref 0.7–4.0)
MCHC: 33 g/dL (ref 30.0–36.0)
MCV: 96.6 fL (ref 78.0–100.0)
Monocytes Absolute: 1 10*3/uL (ref 0.1–1.0)
Monocytes Relative: 9.5 % (ref 3.0–12.0)
Neutro Abs: 6.8 10*3/uL (ref 1.4–7.7)
Neutrophils Relative %: 66.9 % (ref 43.0–77.0)
Platelets: 276 10*3/uL (ref 150.0–400.0)
RBC: 4.27 Mil/uL (ref 3.87–5.11)
RDW: 14.1 % (ref 11.5–15.5)
WBC: 10.1 10*3/uL (ref 4.0–10.5)

## 2023-10-28 LAB — POCT INFLUENZA A/B
Influenza A, POC: NEGATIVE
Influenza B, POC: NEGATIVE

## 2023-10-28 LAB — POCT RAPID STREP A (OFFICE): Rapid Strep A Screen: NEGATIVE

## 2023-10-28 LAB — POC COVID19 BINAXNOW: SARS Coronavirus 2 Ag: NEGATIVE

## 2023-10-28 MED ORDER — MUPIROCIN 2 % EX OINT: 1.0000 | TOPICAL_OINTMENT | Freq: Two times a day (BID) | CUTANEOUS | 0 refills | Status: AC

## 2023-10-28 NOTE — Addendum Note (Signed)
Addended by: Alvina Chou on: 10/28/2023 10:59 AM   Modules accepted: Orders

## 2023-10-28 NOTE — Telephone Encounter (Signed)
 Spoke with pt and she is aware of Tabitha's response. Nothing further was needed.

## 2023-10-28 NOTE — Assessment & Plan Note (Addendum)
Rx mupirocin ointment  Pt advised to:  Please monitor site for worsening signs/symptoms of infection to include: increasing redness, increasing tenderness, increase in size, and or pustulant drainage from site. If this is to occur please let me know immediately.  Wound culture ordered to r/o pathogen Treatment failure with nystatin triam cream

## 2023-10-28 NOTE — Assessment & Plan Note (Signed)
Suspected viral  However advised to monitor for development of infection and or lack of symptom resolution

## 2023-10-28 NOTE — Progress Notes (Signed)
Established Patient Office Visit  Subjective:   Patient ID: Emily Wu, female    DOB: Jun 29, 1931  Age: 89 y.o. MRN: [id number]  CC:  Chief Complaint  Patient presents with   Acute Visit    Rash on both arms and her stomach. Onset was yesterday. Rash is itching at times.    HPI: Emily Wu is a 89 y.o. female presenting on 10/28/2023 for Acute Visit (Rash on both arms and her stomach. Onset was yesterday. Rash is itching at times.)  Yesterday during the night woke up coughing, sneezing, with runny nose. She states this am just not feeling well, body aches, fatigue. No sore throat or ear pain. Some chest tightness, with mild sob however she states she thinks its relative to wearing a mask and nasal congestion. No active wheezing.  No fever as of yet.   She noticed redness bil inner elbows that does not itch, and states she noticed a small rash on her stomach as well.         ROS: Negative unless specifically indicated above in HPI.   Relevant past medical history reviewed and updated as indicated.   Allergies and medications reviewed and updated.   Current Outpatient Medications:    ACETAMINOPHEN ER PO, Take 500 mg by mouth daily., Disp: , Rfl:    diphenoxylate-atropine (LOMOTIL) 2.5-0.025 MG tablet, Take 1 tablet each morning with breakfast.  You may take an additional tablet during the day with each loose stool for a maximum of 4 tablets per day., Disp: , Rfl:    docusate sodium (COLACE) 100 MG capsule, Take 1 capsule (100 mg total) by mouth 2 (two) times daily. To prevent constipation while taking pain medication., Disp: 30 capsule, Rfl: 0   donepezil (ARICEPT) 10 MG tablet, Take 10 mg by mouth at bedtime., Disp: , Rfl:    ipratropium (ATROVENT) 0.06 % nasal spray, Place 2 sprays into both nostrils 4 (four) times daily. FOR RUNNY NOSE, Disp: 15 mL, Rfl: 12   liothyronine (CYTOMEL) 5 MCG tablet, Take 1 tablet (5 mcg total) by mouth daily., Disp: 90 tablet, Rfl:  1   mirtazapine (REMERON) 30 MG tablet, TAKE ONE TABLET (30 MG) BY MOUTH AT BEDTIME, Disp: 90 tablet, Rfl: 0   mupirocin ointment (BACTROBAN) 2 %, Apply 1 Application topically 2 (two) times daily., Disp: 22 g, Rfl: 0   nystatin-triamcinolone ointment (MYCOLOG), Apply 1 Application topically 2 (two) times daily., Disp: 30 g, Rfl: 0   rosuvastatin (CRESTOR) 10 MG tablet, TAKE 1 TABLET BY MOUTH DAILY, Disp: 90 tablet, Rfl: 3   tiZANidine (ZANAFLEX) 4 MG tablet, Take 4 mg by mouth every 6 (six) hours as needed., Disp: , Rfl:    vitamin B-12 (CYANOCOBALAMIN) 1000 MCG tablet, Take 1,000 mcg by mouth daily., Disp: , Rfl:   Allergies  Allergen Reactions   Tramadol Anaphylaxis and Other (See Comments)    Lowered Blood Pressure   Amoxicillin Rash   Promethazine Hcl Rash   Sulfa Drugs Cross Reactors Rash    Objective:   BP 122/70 (BP Location: Left Arm, Patient Position: Sitting, Cuff Size: Normal)   Pulse 75   Temp 97.9 F (36.6 C) (Temporal)   Ht 5\' 2"  (1.575 m)   Wt 126 lb 12.8 oz (57.5 kg)   SpO2 97%   BMI 23.19 kg/m    Physical Exam Vitals reviewed.  Constitutional:      General: She is not in acute distress.    Appearance: Normal appearance.  She is normal weight. She is not ill-appearing, toxic-appearing or diaphoretic.  HENT:     Head: Normocephalic.     Right Ear: Tympanic membrane, ear canal and external ear normal.     Left Ear: Ear canal and external ear normal. A middle ear effusion (clear) is present.     Nose: Nose normal.     Mouth/Throat:     Mouth: Mucous membranes are dry.     Pharynx: Posterior oropharyngeal erythema and postnasal drip present. No oropharyngeal exudate.     Tonsils: 1+ on the right. 1+ on the left.  Eyes:     Extraocular Movements: Extraocular movements intact.     Pupils: Pupils are equal, round, and reactive to light.  Cardiovascular:     Rate and Rhythm: Normal rate and regular rhythm.     Pulses: Normal pulses.     Heart sounds: Normal  heart sounds.  Pulmonary:     Effort: Pulmonary effort is normal.     Breath sounds: Normal breath sounds.  Musculoskeletal:     Cervical back: Normal range of motion.  Lymphadenopathy:     Cervical: Cervical adenopathy present.     Right cervical: No superficial, deep or posterior cervical adenopathy.    Left cervical: Superficial cervical adenopathy present. No deep or posterior cervical adenopathy.  Skin:    Comments: Bil inner elbow erythema , warm to touch  Umbilicus with erythema, abrasions, and slight clear discharge with tenderness   Neurological:     General: No focal deficit present.     Mental Status: She is alert and oriented to person, place, and time. Mental status is at baseline.  Psychiatric:        Mood and Affect: Mood normal.        Behavior: Behavior normal.        Thought Content: Thought content normal.        Judgment: Judgment normal.        Assessment & Plan:  Sore throat -     POCT rapid strep A  Cough, unspecified type -     POC COVID-19 BinaxNow -     POCT Influenza A/B  Cellulitis, umbilical Assessment & Plan: Rx mupirocin ointment  Pt advised to:  Please monitor site for worsening signs/symptoms of infection to include: increasing redness, increasing tenderness, increase in size, and or pustulant drainage from site. If this is to occur please let me know immediately.  Wound culture ordered to r/o pathogen Treatment failure with nystatin triam cream   Orders: -     Mupirocin; Apply 1 Application topically 2 (two) times daily.  Dispense: 22 g; Refill: 0 -     CBC with Differential/Platelet -     WOUND CULTURE  Viral upper respiratory infection Assessment & Plan: Covid flu and strep negative Suspected viral infection.  Tylenol prn fever Advised patient on supportive measures:  Be sure to rest, drink plenty of fluids Follow up if fever >101, if symptoms worsen or if symptoms are not improved in 3 days. Patient verbalizes understanding.   Rsv ordered pending.  Cbc today        Viral rash Assessment & Plan: Suspected viral  However advised to monitor for development of infection and or lack of symptom resolution       Follow up plan: Return for f/u PCP if no improvement in symptoms.  Mort Sawyers, FNP

## 2023-10-28 NOTE — Telephone Encounter (Signed)
While patient was in the lab she was talking about the spot on inside of elbows and was asking if you were going to call something in for that she was unsure. So I told patient I would send a message back.

## 2023-10-28 NOTE — Telephone Encounter (Signed)
Please advise pt I suspect the elbow rashes are viral so nothing to use at this time. Monitor for worsening rash and or if it becomes itchy or irritating otherwise should resolve with sickness

## 2023-10-28 NOTE — Progress Notes (Signed)
Cbc normal

## 2023-10-28 NOTE — Addendum Note (Signed)
Addended by: Donnamarie Poag on: 10/28/2023 10:50 AM   Modules accepted: Orders

## 2023-10-28 NOTE — Assessment & Plan Note (Addendum)
Covid flu and strep negative Suspected viral infection.  Tylenol prn fever Advised patient on supportive measures:  Be sure to rest, drink plenty of fluids Follow up if fever >101, if symptoms worsen or if symptoms are not improved in 3 days. Patient verbalizes understanding.  Rsv ordered pending.  Cbc today

## 2023-10-30 ENCOUNTER — Other Ambulatory Visit: Payer: Self-pay | Admitting: Family

## 2023-10-30 DIAGNOSIS — L089 Local infection of the skin and subcutaneous tissue, unspecified: Secondary | ICD-10-CM

## 2023-10-30 MED ORDER — CEPHALEXIN 500 MG PO CAPS: 500.0000 mg | ORAL_CAPSULE | Freq: Three times a day (TID) | ORAL | 0 refills | Status: AC

## 2023-10-30 NOTE — Progress Notes (Signed)
You were found to have strep bacteria in your belly button. I am sending in an RX for cephalexin.   Cephalexin is not penicillin but it is related. Typically if you have a rash with penicillin you will tolerate cephalosporin medications just fine just watch for rash that is not similar to one you have currently.

## 2023-10-31 LAB — WOUND CULTURE
MICRO NUMBER:: 15808078
SPECIMEN QUALITY:: ADEQUATE

## 2023-11-01 LAB — RESPIRATORY VIRUS PCR, PANEL
ADENOVIRUS DNA, QL REAL TIME PCR: NOT DETECTED
HUMAN METAPNEUMOVIRUS RNA, QL RT PCR: NOT DETECTED
INFLUENZA A RNA, QL PCR: NOT DETECTED
INFLUENZA B RNA, QL PCR: NOT DETECTED
PARAINFLUENZA 4 RNA: NOT DETECTED
Parainfluenza 1: NOT DETECTED
Parainfluenza 2: NOT DETECTED
Parainfluenza 3: NOT DETECTED
RHNOVRS RNA RT PCR: NOT DETECTED
RSV RNA QL PCR: NOT DETECTED

## 2023-11-02 ENCOUNTER — Telehealth: Payer: Self-pay | Admitting: Internal Medicine

## 2023-11-02 NOTE — Telephone Encounter (Signed)
Spoke with pt and she states that she is feeling better since seeing Tabitha last week. Advised her that if her symptoms returned to call her PCP's office to see if they can fit her to be evaluated. She verbalized understanding. Nothing further was needed.

## 2023-11-02 NOTE — Telephone Encounter (Signed)
Patient called regarding visit from 12/4, states she is still having respiratory related symptoms of headache, body aches, chest congestion/ cough. Wants to know if Wyatt Mage can either send in something to help with symptoms or if she has suggestions for OTC meds to help? Please advise.

## 2023-11-10 ENCOUNTER — Other Ambulatory Visit: Payer: Self-pay | Admitting: Internal Medicine

## 2023-12-17 DIAGNOSIS — H25013 Cortical age-related cataract, bilateral: Secondary | ICD-10-CM | POA: Diagnosis not present

## 2023-12-17 DIAGNOSIS — H18413 Arcus senilis, bilateral: Secondary | ICD-10-CM | POA: Diagnosis not present

## 2023-12-17 DIAGNOSIS — H2511 Age-related nuclear cataract, right eye: Secondary | ICD-10-CM | POA: Diagnosis not present

## 2023-12-17 DIAGNOSIS — H2513 Age-related nuclear cataract, bilateral: Secondary | ICD-10-CM | POA: Diagnosis not present

## 2023-12-17 DIAGNOSIS — H25043 Posterior subcapsular polar age-related cataract, bilateral: Secondary | ICD-10-CM | POA: Diagnosis not present

## 2023-12-31 DIAGNOSIS — L570 Actinic keratosis: Secondary | ICD-10-CM | POA: Diagnosis not present

## 2023-12-31 DIAGNOSIS — L820 Inflamed seborrheic keratosis: Secondary | ICD-10-CM | POA: Diagnosis not present

## 2023-12-31 DIAGNOSIS — L720 Epidermal cyst: Secondary | ICD-10-CM | POA: Diagnosis not present

## 2023-12-31 DIAGNOSIS — D485 Neoplasm of uncertain behavior of skin: Secondary | ICD-10-CM | POA: Diagnosis not present

## 2023-12-31 DIAGNOSIS — C44311 Basal cell carcinoma of skin of nose: Secondary | ICD-10-CM | POA: Diagnosis not present

## 2024-01-20 DIAGNOSIS — H2511 Age-related nuclear cataract, right eye: Secondary | ICD-10-CM | POA: Diagnosis not present

## 2024-01-20 DIAGNOSIS — H25011 Cortical age-related cataract, right eye: Secondary | ICD-10-CM | POA: Diagnosis not present

## 2024-01-21 DIAGNOSIS — H2512 Age-related nuclear cataract, left eye: Secondary | ICD-10-CM | POA: Diagnosis not present

## 2024-02-10 ENCOUNTER — Other Ambulatory Visit: Payer: Self-pay | Admitting: Internal Medicine

## 2024-02-17 DIAGNOSIS — H2512 Age-related nuclear cataract, left eye: Secondary | ICD-10-CM | POA: Diagnosis not present

## 2024-02-17 DIAGNOSIS — H25012 Cortical age-related cataract, left eye: Secondary | ICD-10-CM | POA: Diagnosis not present

## 2024-03-14 DIAGNOSIS — R208 Other disturbances of skin sensation: Secondary | ICD-10-CM | POA: Diagnosis not present

## 2024-03-14 DIAGNOSIS — L570 Actinic keratosis: Secondary | ICD-10-CM | POA: Diagnosis not present

## 2024-03-14 DIAGNOSIS — C44311 Basal cell carcinoma of skin of nose: Secondary | ICD-10-CM | POA: Diagnosis not present

## 2024-03-14 DIAGNOSIS — L820 Inflamed seborrheic keratosis: Secondary | ICD-10-CM | POA: Diagnosis not present

## 2024-03-14 DIAGNOSIS — L821 Other seborrheic keratosis: Secondary | ICD-10-CM | POA: Diagnosis not present

## 2024-03-17 ENCOUNTER — Ambulatory Visit: Admitting: Family Medicine

## 2024-03-18 ENCOUNTER — Ambulatory Visit (INDEPENDENT_AMBULATORY_CARE_PROVIDER_SITE_OTHER): Admitting: Nurse Practitioner

## 2024-03-18 ENCOUNTER — Encounter: Payer: Self-pay | Admitting: Nurse Practitioner

## 2024-03-18 VITALS — BP 114/68 | HR 55 | Temp 97.5°F | Ht 62.0 in | Wt 124.8 lb

## 2024-03-18 DIAGNOSIS — M25511 Pain in right shoulder: Secondary | ICD-10-CM | POA: Diagnosis not present

## 2024-03-18 NOTE — Patient Instructions (Signed)
 Alternate warm and cold compress. Referral sent for physical therapy.

## 2024-03-18 NOTE — Progress Notes (Signed)
 Established Patient Office Visit  Subjective:  Patient ID: Emily Wu, female    DOB: 03/06/1931  Age: 89 y.o. MRN: [id number]  CC:  Chief Complaint  Patient presents with   Acute Visit    Right shoulder pain that shoots down right arm Certain movements are painful   Discussed the use of a AI scribe software for clinical note transcription with the patient, who gave verbal consent to proceed.  HPI  Emily Wu is a 89 year old female who presents with right shoulder pain.  She has been experiencing shooting pain in her right shoulder for the past month, triggered by lifting her arm or reaching for objects. The pain is described as a shooting sensation lasting a few seconds, localized to the deltoid area, and does not radiate below the elbow. It is intermittent, occurring during specific movements, and does not require pain medication.  She has a history of shoulder surgery. There have been no recent falls, injuries, or heavy lifting that could have contributed to the pain. She has not attempted any treatments such as hot or cold packs to alleviate the discomfort.  She lives alone and receives home care assistance from [facility], a service she has been using since January 2021 after her husband's passing. Caregivers assist with housework, transportation, and maintaining her daily activities, including outings for lunch and gardening.  HPI   Past Medical History:  Diagnosis Date   Arthritis    "knees, shoulders, chest" (02/03/2014)   Bruises easily    Cervical cancer [facility]) 1965   Concussion 03/11/2020   High cholesterol    History of kidney stones    Hypertension    DENIES    Tendency toward bleeding easily [facility])     Past Surgical History:  Procedure Laterality Date   ABDOMINAL HYSTERECTOMY  1965   APPENDECTOMY     CHOLECYSTECTOMY     KNEE ARTHROSCOPY Left    [facility]   PARTIAL KNEE ARTHROPLASTY Left 01/26/2019   Procedure: UNICOMPARTMENTAL KNEE;  Surgeon:  Osa Blase, MD;  Location: [facility] OR;  Service: Orthopedics;  Laterality: Left;   PARTIAL KNEE ARTHROPLASTY Right 12/10/2021   Procedure: UNICOMPARTMENTAL KNEE;  Surgeon: Osa Blase, MD;  Location: [facility] ORS;  Service: Orthopedics;  Laterality: Right;   SHOULDER OPEN ROTATOR CUFF REPAIR Bilateral 2007,  2010   [facility]   THROAT SURGERY  2014   "arthritis [place] knot" (02/03/2014)    Family History  Problem Relation Age of Onset   Peripheral vascular disease Mother    Stroke Father    Hyperlipidemia Father    Heart disease Father 40   Cancer Neg Hx     Social History   Socioeconomic History   Marital status: Married    Spouse name: Not on file   Number of children: Not on file   Years of education: Not on file   Highest education level: Not on file  Occupational History   Not on file  Tobacco Use   Smoking status: Former    Current packs/day: 0.00    Average packs/day: 2.0 packs/day for 7.0 years (14.0 ttl pk-yrs)    Types: Cigarettes    Start date: 76    Quit date: 57    Years since quitting: 65.4   Smokeless tobacco: Never   Tobacco comments:    02/03/2014 "quit smoking in the 1960's"  Vaping Use   Vaping status: Never Used  Substance and Sexual Activity   Alcohol use: No  Drug use: No   Sexual activity: Never  Other Topics Concern   Not on file  Social History Narrative   Widowed   Has a caregiver 6 days a week   Social Drivers of Corporate investment banker Strain: Low Risk  (06/26/2023)   Overall Financial Resource Strain (CARDIA)    Difficulty of Paying Living Expenses: Not hard at all  Food Insecurity: No Food Insecurity (06/26/2023)   Hunger Vital Sign    Worried About Running Out of Food in the Last Year: Never true    Ran Out of Food in the Last Year: Never true  Transportation Needs: No Transportation Needs (06/26/2023)   PRAPARE - Administrator, Civil Service (Medical): No    Lack of Transportation (Non-Medical): No  Physical Activity:  Inactive (06/26/2023)   Exercise Vital Sign    Days of Exercise per Week: 0 days    Minutes of Exercise per Session: 0 min  Stress: No Stress Concern Present (06/26/2023)   [employer] of Occupational Health - Occupational Stress Questionnaire    Feeling of Stress : Only a little  Social Connections: Socially Isolated (06/26/2023)   Social Connection and Isolation Panel [NHANES]    Frequency of Communication with Friends and Family: More than three times a week    Frequency of Social Gatherings with Friends and Family: More than three times a week    Attends Religious Services: Never    Database administrator or Organizations: No    Attends Banker Meetings: Never    Marital Status: Widowed  Intimate Partner Violence: Not At Risk (06/26/2023)   Humiliation, Afraid, Rape, and Kick questionnaire    Fear of Current or Ex-Partner: No    Emotionally Abused: No    Physically Abused: No    Sexually Abused: No     Outpatient Medications Prior to Visit  Medication Sig Dispense Refill   ACETAMINOPHEN  ER PO Take 500 mg by mouth daily.     diphenoxylate-atropine (LOMOTIL) 2.5-0.025 MG tablet Take 1 tablet each morning with breakfast.  You may take an additional tablet during the day with each loose stool for a maximum of 4 tablets per day.     docusate sodium  (COLACE) 100 MG capsule Take 1 capsule (100 mg total) by mouth 2 (two) times daily. To prevent constipation while taking pain medication. 30 capsule 0   donepezil  (ARICEPT ) 10 MG tablet Take 10 mg by mouth at bedtime.     ipratropium (ATROVENT ) 0.06 % nasal spray Place 2 sprays into both nostrils 4 (four) times daily. FOR RUNNY NOSE 15 mL 12   liothyronine  (CYTOMEL ) 5 MCG tablet Take 1 tablet (5 mcg total) by mouth daily. 90 tablet 1   mirtazapine  (REMERON ) 30 MG tablet TAKE 1 TABLET BY MOUTH AT BEDTIME AS DIRECTED. 90 tablet 0   mupirocin  ointment (BACTROBAN ) 2 % Apply 1 Application topically 2 (two) times daily. 22 g 0    nystatin -triamcinolone  ointment (MYCOLOG) Apply 1 Application topically 2 (two) times daily. 30 g 0   rosuvastatin  (CRESTOR ) 10 MG tablet TAKE 1 TABLET BY MOUTH DAILY 90 tablet 3   tiZANidine (ZANAFLEX) 4 MG tablet Take 4 mg by mouth every 6 (six) hours as needed.     vitamin B-12 (CYANOCOBALAMIN ) 1000 MCG tablet Take 1,000 mcg by mouth daily.     No facility-administered medications prior to visit.    Allergies  Allergen Reactions   Tramadol  Anaphylaxis and Other (See Comments)  Lowered Blood Pressure   Amoxicillin Rash   Promethazine Hcl Rash   Sulfa Drugs Cross Reactors Rash    ROS Review of Systems Negative unless indicated in HPI.    Objective:    Physical Exam Constitutional:      Appearance: Normal appearance.  Cardiovascular:     Rate and Rhythm: Normal rate and regular rhythm.     Pulses: Normal pulses.     Heart sounds: Normal heart sounds.  Pulmonary:     Effort: Pulmonary effort is normal.     Breath sounds: Normal breath sounds. No wheezing.  Musculoskeletal:        General: No tenderness. Normal range of motion.  Neurological:     General: No focal deficit present.     Mental Status: She is alert and oriented to person, place, and time.  Psychiatric:        Mood and Affect: Mood normal.        Behavior: Behavior normal.     BP 114/68   Pulse (!) 55   Temp (!) 97.5 F (36.4 C)   Ht 5\' 2"  (1.575 m)   Wt 124 lb 12.8 oz (56.6 kg)   SpO2 98%   BMI 22.83 kg/m  Wt Readings from Last 3 Encounters:  03/18/24 124 lb 12.8 oz (56.6 kg)  10/28/23 126 lb 12.8 oz (57.5 kg)  09/23/23 129 lb 12.8 oz (58.9 kg)     Health Maintenance  Topic Date Due   Zoster Vaccines- Shingrix (2 of 2) 09/29/2018   DTaP/Tdap/Td (2 - Td or Tdap) 11/08/2023   INFLUENZA VACCINE  06/24/2024   Medicare Annual Wellness (AWV)  06/25/2024   Pneumonia Vaccine 55+ Years old  Completed   DEXA SCAN  Completed   HPV VACCINES  Aged Out   Meningococcal B Vaccine  Aged Out    MAMMOGRAM  Discontinued   COVID-19 Vaccine  Discontinued    There are no preventive care reminders to display for this patient.  Lab Results  Component Value Date   TSH 2.77 09/23/2023   Lab Results  Component Value Date   WBC 10.1 10/28/2023   HGB 13.6 10/28/2023   HCT 41.2 10/28/2023   MCV 96.6 10/28/2023   PLT 276.0 10/28/2023   Lab Results  Component Value Date   NA 142 09/23/2023   K 4.3 09/23/2023   CO2 31 09/23/2023   GLUCOSE 84 09/23/2023   BUN 15 09/23/2023   CREATININE 1.08 09/23/2023   BILITOT 0.3 09/23/2023   ALKPHOS 68 09/23/2023   AST 17 09/23/2023   ALT 12 09/23/2023   PROT 6.7 09/23/2023   ALBUMIN 3.9 09/23/2023   CALCIUM  9.5 09/23/2023   ANIONGAP 7 12/11/2021   GFR 45.27 (L) 09/23/2023   Lab Results  Component Value Date   CHOL 168 09/23/2023   Lab Results  Component Value Date   HDL 32.00 (L) 09/23/2023   Lab Results  Component Value Date   LDLCALC 84 09/23/2023   Lab Results  Component Value Date   TRIG 258.0 (H) 09/23/2023   Lab Results  Component Value Date   CHOLHDL 5 09/23/2023   Lab Results  Component Value Date   HGBA1C 5.7 03/18/2023      Assessment & Plan:  Acute pain of right shoulder Assessment & Plan: No recent trauma. Pain intermittent, localized, and movement-induced. - Refer to physical therapy.. - Advise alternate warm compresses for pain relief. - Instruct on home exercises  Orders: -  Ambulatory referral to Physical Therapy    Follow-up: No follow-ups on file.   Taysom Glymph, NP

## 2024-03-29 DIAGNOSIS — G309 Alzheimer's disease, unspecified: Secondary | ICD-10-CM | POA: Diagnosis not present

## 2024-03-29 DIAGNOSIS — F0150 Vascular dementia without behavioral disturbance: Secondary | ICD-10-CM | POA: Diagnosis not present

## 2024-03-29 DIAGNOSIS — F0280 Dementia in other diseases classified elsewhere without behavioral disturbance: Secondary | ICD-10-CM | POA: Diagnosis not present

## 2024-03-29 DIAGNOSIS — R2689 Other abnormalities of gait and mobility: Secondary | ICD-10-CM | POA: Diagnosis not present

## 2024-03-30 ENCOUNTER — Ambulatory Visit: Admitting: Physical Therapy

## 2024-04-01 ENCOUNTER — Ambulatory Visit: Payer: Self-pay

## 2024-04-01 NOTE — Telephone Encounter (Signed)
 Patient was notified of provider's recommendations. Patient is scheduled for Monday with Dr Narendra at 11:40 am. Verbalized understanding and has no further questions.

## 2024-04-01 NOTE — Telephone Encounter (Signed)
 Reason for Triage: Patient took UTI test from Pharmacy and it says she does have UTI- patient wants to know what she can take    Chief Complaint: Urinary pressure. Home test positive for UTI. Appointment made for next week. Cannot come in sooner due to transportation issues and prior appointments. Symptoms: Above Frequency: Yesterday Pertinent Negatives: Patient denies  Disposition: [] ED /[] Urgent Care (no appt availability in office) / [x] Appointment(In office/virtual)/ []  [place]/ [] Home Care/ [] Refused Recommended Disposition /[] [place] [facility]/ []  Follow-up with PCP Additional Notes: Will call back if symptoms worsen.  Reason for Disposition  Urinating more frequently than usual (i.e., frequency)  Answer Assessment - Initial Assessment Questions 1. SYMPTOM: "What's the main symptom you're concerned about?" (e.g., frequency, incontinence)     Pressure 2. ONSET: "When did the    start?"     Yesterday 3. PAIN: "Is there any pain?" If Yes, ask: "How bad is it?" (Scale: 1-10; mild, moderate, severe)     none 4. CAUSE: "What do you think is causing the symptoms?"     UTI 5. OTHER SYMPTOMS: "Do you have any other symptoms?" (e.g., blood in urine, fever, flank pain, pain with urination)     No 6. PREGNANCY: "Is there any chance you are pregnant?" "When was your last menstrual period?"     No  Protocols used: Urinary Symptoms-A-AH

## 2024-04-03 DIAGNOSIS — M25511 Pain in right shoulder: Secondary | ICD-10-CM | POA: Insufficient documentation

## 2024-04-03 NOTE — Assessment & Plan Note (Signed)
 No recent trauma. Pain intermittent, localized, and movement-induced. - Refer to physical therapy.. - Advise alternate warm compresses for pain relief. - Instruct on home exercises

## 2024-04-04 ENCOUNTER — Encounter: Payer: Self-pay | Admitting: Internal Medicine

## 2024-04-04 ENCOUNTER — Ambulatory Visit (INDEPENDENT_AMBULATORY_CARE_PROVIDER_SITE_OTHER): Admitting: Internal Medicine

## 2024-04-04 ENCOUNTER — Ambulatory Visit

## 2024-04-04 VITALS — BP 120/60 | HR 64 | Ht 62.0 in | Wt 123.4 lb

## 2024-04-04 DIAGNOSIS — H20042 Secondary noninfectious iridocyclitis, left eye: Secondary | ICD-10-CM | POA: Diagnosis not present

## 2024-04-04 DIAGNOSIS — Z961 Presence of intraocular lens: Secondary | ICD-10-CM | POA: Diagnosis not present

## 2024-04-04 DIAGNOSIS — R350 Frequency of micturition: Secondary | ICD-10-CM | POA: Diagnosis not present

## 2024-04-04 DIAGNOSIS — Z9842 Cataract extraction status, left eye: Secondary | ICD-10-CM | POA: Diagnosis not present

## 2024-04-04 DIAGNOSIS — H43813 Vitreous degeneration, bilateral: Secondary | ICD-10-CM | POA: Diagnosis not present

## 2024-04-04 DIAGNOSIS — N3000 Acute cystitis without hematuria: Secondary | ICD-10-CM | POA: Diagnosis not present

## 2024-04-04 LAB — POC URINALSYSI DIPSTICK (AUTOMATED)
Bilirubin, UA: NEGATIVE
Glucose, UA: NEGATIVE
Nitrite, UA: NEGATIVE
Protein, UA: POSITIVE — AB
Spec Grav, UA: >=1.03 — AB (ref 1.010–1.025)
Urobilinogen, UA: 0.2 U/dL
pH, UA: 5.5 (ref 5.0–8.0)

## 2024-04-04 MED ORDER — CEPHALEXIN 500 MG PO CAPS
500.0000 mg | ORAL_CAPSULE | Freq: Two times a day (BID) | ORAL | 0 refills | Status: DC
Start: 2024-04-04 — End: 2024-08-24

## 2024-04-04 NOTE — Patient Instructions (Signed)
-   Was a pleasure meeting you today -I suspect that your symptoms are likely secondary to an underlying urinary tract infection -We will treat you with an antibiotic (Keflex ) to complete a 5-day course -We will follow-up the urine culture to make sure that the antibiotic we send you home on is effective for the bacteria that is causing your infection -Please contact us  with any questions or concerns or if your symptoms worsen

## 2024-04-04 NOTE — Therapy (Incomplete)
 OUTPATIENT PHYSICAL THERAPY SHOULDER/ELBOW EVALUATION  Patient Name: Emily Wu MRN: [id number] DOB:1931/09/03, 89 y.o., female Today's Date: 04/04/2024  END OF SESSION:   Past Medical History:  Diagnosis Date   Arthritis    "knees, shoulders, chest" (02/03/2014)   Bruises easily    Cervical cancer [facility]) 1965   Concussion 03/11/2020   High cholesterol    History of kidney stones    Hypertension    DENIES    Tendency toward bleeding easily [facility])    Past Surgical History:  Procedure Laterality Date   ABDOMINAL HYSTERECTOMY  1965   APPENDECTOMY     CHOLECYSTECTOMY     KNEE ARTHROSCOPY Left    [place]   PARTIAL KNEE ARTHROPLASTY Left 01/26/2019   Procedure: UNICOMPARTMENTAL KNEE;  Surgeon: Osa Blase, MD;  Location: [facility] OR;  Service: Orthopedics;  Laterality: Left;   PARTIAL KNEE ARTHROPLASTY Right 12/10/2021   Procedure: UNICOMPARTMENTAL KNEE;  Surgeon: Osa Blase, MD;  Location: [facility] ORS;  Service: Orthopedics;  Laterality: Right;   SHOULDER OPEN ROTATOR CUFF REPAIR Bilateral 2007,  2010   [place]   THROAT SURGERY  2014   "arthritis hugh knot" (02/03/2014)   Patient Active Problem List   Diagnosis Date Noted   Acute pain of right shoulder 04/03/2024   Cellulitis, umbilical 10/28/2023   Frequent falls 09/24/2023   Dark stools 08/24/2023   Anemia 01/04/2022   S/P right unicompartmental knee replacement 12/10/2021   Other fatigue 10/22/2021   Exocrine pancreatic insufficiency 10/22/2021   Encounter for preoperative assessment 09/11/2021   Aortic atherosclerosis (HCC) 09/11/2021   Mixed Alzheimer's and vascular dementia (HCC) 09/13/2020   Generalized muscle weakness 09/13/2020   Pulmonary nodule less than 1 cm in diameter with low risk for malignant neoplasm 07/26/2020   Depression 07/26/2020   B12 deficiency 07/26/2020   Acquired kyphosis 04/26/2020   Widowed 03/11/2020   Osteoarthritis of right knee 08/26/2019   S/P left unicompartmental knee replacement  01/26/2019   Hospital discharge follow-up 12/26/2018   Incidental pulmonary nodule, > 3mm and < 8mm 12/26/2018   Chronic kidney disease (CKD), stage III (moderate) 12/26/2018   Left bundle branch block (LBBB) determined by electrocardiography 12/02/2018   GERD (gastroesophageal reflux disease) 09/29/2018   Anxiety disorder 03/25/2018   Diarrhea due to malabsorption 04/18/2017   Primary osteoarthritis of right knee 10/13/2016   Chronic pain of right knee 05/27/2016   Knee pain, bilateral 05/27/2016   Exertional dyspnea 04/15/2016   Left carotid bruit 04/14/2016   Menopausal hot flushes 08/07/2015   H/O malignant neoplasm of skin 06/25/2015   Hypothyroidism 01/31/2015   History of cervical cancer 01/10/2015   Hyperlipidemia 02/03/2014   Medicare annual wellness visit, subsequent 01/19/2014   History of IBS 01/19/2014   S/P breast augmentation 01/19/2014   Primary osteoarthritis of left knee 07/06/2012   Colon polyps 01/30/2012   Low back pain 01/30/2012    PCP: Thersia Flax, MD  REFERRING PROVIDER:  Tona Francis, NP   REFERRING DIAG:  M25.511 (ICD-10-CM) - Acute pain of right shoulder   RATIONALE FOR EVALUATION AND TREATMENT: Rehabilitation  THERAPY DIAG: Acute pain of right shoulder  Muscle weakness (generalized)  ONSET DATE: 02/23/2024   FOLLOW-UP APPT SCHEDULED WITH REFERRING PROVIDER: {yes/no:20286}    SUBJECTIVE:  SUBJECTIVE STATEMENT:  R Shoulder Pain  PERTINENT HISTORY:   She has been experiencing shooting pain in her right shoulder for the past month, triggered by lifting her arm or reaching for objects. The pain is described as a shooting sensation lasting a few seconds, localized to the R deltoid region. Pain aggravated with specific lifting or overhead motions. She has  a history of rotator cuff surgery (2007). Pain alleviated with ***.  She lives alone and receives home care assistance from [facility] [employer], a service she has been using since January 2021 after her husband's passing. Caregivers assist with housework, transportation, and maintaining her daily activities, including outings for lunch and gardening. Patient denies recent falls or direct trauma to the right shoulder.   PAIN:  Pain Intensity: Present: /10, Best: /10, Worst: /10 Pain location: *** Pain Quality: {PAIN DESCRIPTION:21022940}  Radiating: {yes/no:20286}  Numbness/Tingling: {yes/no:20286} Focal Weakness: {yes/no:20286} Aggravating factors: *** Relieving factors: *** 24-hour pain behavior: *** History of prior shoulder or neck/shoulder injury, pain, surgery, or therapy: {yes/no:20286} Falls: Has patient fallen in last 6 months? {yes/no:20286}, Number of falls: *** Dominant hand: {RIGHT/LEFT:20294} Imaging: {yes/no:20286}  Red flags (personal history of cancer, chills/fever, night sweats, nausea, vomiting, unrelenting pain, unexplained weight gain/loss): Negative  PRECAUTIONS: {Therapy precautions:24002}  WEIGHT BEARING RESTRICTIONS: {Yes ***/No:24003}  FALLS: Has patient fallen in last 6 months? {fallsyesno:27318}  Living Environment Lives with: lives alone Lives in: House/apartment Stairs: {opstairs:27293} Has following equipment at home: {Assistive devices:23999}  Prior level of function: Requires assistive device for independence, Needs assistance with ADLs, and Needs assistance with homemaking      Occupational demands: Retired  Hobbies: Gardening   Patient Goals: ***    OBJECTIVE:   Patient Surveys   Cognition Patient is oriented to person, place, and time.  Recent memory is intact.  Remote memory is intact.  Attention span and concentration are intact.  Expressive speech is intact.  Patient's fund of knowledge is within normal limits for educational  level.    Gross Musculoskeletal Assessment Tremor: None Bulk: Normal Tone: Normal  Gait   Posture   Cervical Screen AROM: WFL and painless with overpressure in all planes Spurlings A (ipsilateral lateral flexion/axial compression): R: {NEGATIVE/POSITIVE BJY:78295} L: {NEGATIVE/POSITIVE AOZ:30865}  AROM AROM (Normal range in degrees) AROM  Cervical  Flexion (50)   Extension (80)   Right lateral flexion (45)   Left lateral flexion (45)   Right rotation (85)   Left rotation (85)    Right Left  Shoulder    Flexion    Extension    Abduction    External Rotation    Internal Rotation    Hands Behind Head    Hands Behind Back        Elbow    Flexion    Extension    Pronation    Supination    (* = pain; Blank rows = not tested)  UE MMT: MMT (out of 5) Right Left   Cervical (isometric)  Flexion WNL  Extension WNL  Lateral Flexion WNL WNL  Rotation WNL WNL      Shoulder   Flexion    Extension    Abduction    External rotation    Internal rotation    Horizontal abduction    Horizontal adduction    Lower Trapezius    Rhomboids        Elbow  Flexion    Extension    Pronation    Supination  Wrist  Flexion    Extension    Radial deviation    Ulnar deviation        MCP  Flexion    Extension    Abduction    Adduction    (* = pain; Blank rows = not tested)  Sensation Grossly intact to light touch bilateral UE as determined by testing dermatomes C2-T2. Proprioception and hot/cold testing deferred on this date.  Reflexes  Palpation Location LEFT  RIGHT           Subocciptials    Cervical paraspinals    Upper Trapezius    Levator Scapulae    Rhomboid Major/Minor    Sternoclavicular joint    Acromioclavicular joint    Coracoid process    Long head of biceps    Supraspinatus    Infraspinatus    Subscapularis    Teres Minor    Teres Major    Pectoralis Major    Pectoralis Minor    Anterior Deltoid    Lateral Deltoid    Posterior  Deltoid    Latissimus Dorsi    Sternocleidomastoid    (Blank rows = not tested) Graded on 0-4 scale (0 = no pain, 1 = pain, 2 = pain with wincing/grimacing/flinching, 3 = pain with withdrawal, 4 = unwilling to allow palpation), (Blank rows = not tested)  Accessory Motions/Glides Glenohumeral: Posterior: R: {normal/abnormal/not examined:14677} L: {normal/abnormal/not examined:14677} Inferior: R: {normal/abnormal/not examined:14677} L: {normal/abnormal/not examined:14677} Anterior: R: {normal/abnormal/not examined:14677} L: {normal/abnormal/not examined:14677}  SPECIAL TESTS Rotator Cuff  Drop Arm Test: {NEGATIVE/POSITIVE ZOX:09604} Painful Arc (Pain from 60 to 120 degrees scaption): {NEGATIVE/POSITIVE VWU:98119} Infraspinatus Muscle Test: {NEGATIVE/POSITIVE JYN:82956} If all 3 tests positive, the probability of a full-thickness rotator cuff tear is 91%  Subacromial Impingement Hawkins-Kennedy: {NEGATIVE/POSITIVE OZH:08657} Neer (Block scapula, PROM flexion): {NEGATIVE/POSITIVE QIO:96295} Painful Arc (Pain from 60 to 120 degrees scaption): {NEGATIVE/POSITIVE MWU:13244} Empty Can: {NEGATIVE/POSITIVE WNU:27253} External Rotation Resistance: {NEGATIVE/POSITIVE GUY:40347} Horizontal Adduction: {NEGATIVE/POSITIVE QQV:95638} Scapular Assist: {NEGATIVE/POSITIVE VFI:43329} Positive Hawkins-Kennedy, Painful arc sign, Infraspinatus muscle test then +LR: 10.56 of some type of impingement present, 2/3 tests: +LR 5.06, -LR 0.17, Positive 3/5 Hawkins-Kennedy, neer, painful arc, empty can, and external rotation resistance then SN: .75 (.54-.96) SP: .74 (.61-.88) +LR: 2.93 (1.60-5.36) -LR: .34 (.14-.80)   Bicep Tendon Pathology Speed (shoulder flexion to 90, external rotation, full elbow extension, and forearm supination with resistance: {NEGATIVE/POSITIVE JJO:84166} Yergason's (resisted shoulder ER and supination/biceps tendon pathology): {NEGATIVE/POSITIVE AYT:01601}  TODAY'S TREATMENT   ***   PATIENT EDUCATION:  Education details: HEP, POC, Prognosis Person educated: {Person educated:25204} Education method: {Education Method:25205} Education comprehension: {Education Comprehension:25206}   HOME EXERCISE PROGRAM:    ASSESSMENT:  CLINICAL IMPRESSION: Patient is a 89 y.o. female who was seen today for physical therapy evaluation and treatment for R shoulder pain. Objective findings significant for ***.    OBJECTIVE IMPAIRMENTS: {opptimpairments:25111}.   ACTIVITY LIMITATIONS: {activitylimitations:27494}  PARTICIPATION LIMITATIONS: {participationrestrictions:25113}  PERSONAL FACTORS: {Personal factors:25162} are also affecting patient's functional outcome.   REHAB POTENTIAL: {rehabpotential:25112}  CLINICAL DECISION MAKING: {clinical decision making:25114}  EVALUATION COMPLEXITY: {Evaluation complexity:25115}   GOALS: Goals reviewed with patient? Yes  SHORT TERM GOALS: Target date: 05/02/2024  Pt will be independent with HEP to improve strength and decrease shoulder pain to improve pain-free function at home and work. Baseline: *** Goal status: INITIAL   LONG TERM GOALS: Target date: 05/30/2024    Baseline:  Goal status: INITIAL  2.  Pt will decrease worst shoulder pain with overhead reaching ADLs by at  least 3 points on the NPRS in order to demonstrate clinically significant reduction in shoulder pain. Baseline: *** Goal status: INITIAL  3.  Pt will decrease quick DASH score by at least 8% in order to demonstrate clinically significant reduction in disability related to shoulder pain        Baseline: *** Goal status: INITIAL  4. Pt will increase strength by at least 1/2 MMT grade in order to demonstrate improvement in strength and function         Baseline: *** Goal status: INITIAL   PLAN: PT FREQUENCY: 1-2x/week  PT DURATION: 8 weeks  PLANNED INTERVENTIONS: Therapeutic exercises, Therapeutic activity, Neuromuscular re-education,  Balance training, Gait training, Patient/Family education, Self Care, Joint mobilization, Joint manipulation, Vestibular training, Canalith repositioning, Orthotic/Fit training, DME instructions, Dry Needling, Electrical stimulation, Spinal manipulation, Spinal mobilization, Cryotherapy, Moist heat, Taping, Traction, Ultrasound, Ionotophoresis 4mg /ml Dexamethasone , Manual therapy, and Re-evaluation.  PLAN FOR NEXT SESSION: ***   Satira Curet PT, DPT Physical Therapist-   [facility]  04/04/2024, 8:20 AM

## 2024-04-04 NOTE — Assessment & Plan Note (Signed)
-   Patient is here today for UTI symptoms.  Patient states that over the last 3 to 4 days she has noted dysuria, increased frequency, urgency -No fevers or chills noted.  No signs of pyelonephritis -She went to the pharmacy and took this for a UTI and states that this was positive -On exam, patient had no abdominal tenderness or CVA tenderness -Her urine dipstick showed moderate blood as well as trace leukocytes consistent with urinary tract infection -I have reviewed her prior lab work including her BMP which showed a decreased GFR of 45.  Patient is not a candidate for nitrofurantoin -Patient is also not a candidate for Bactrim as she has an allergy to sulfa drugs -Will treat the patient with Keflex  500 mg twice daily (chart reviewed and this has been prescribed to the patient before without any evidence of side effects)

## 2024-04-04 NOTE — Progress Notes (Signed)
 Acute Office Visit  Subjective:     Patient ID: Emily Wu, female    DOB: [birth year], 89 y.o.   MRN: [id number]  Chief Complaint  Patient presents with   Dysuria    Dysuria  Associated symptoms include frequency and urgency. Pertinent negatives include no hematuria, nausea or vomiting.   Patient is in today for UTI symptoms.  Patient states that over the last 3 to 4 days she has noted burning while urinating as well as increased frequency and urgency and a pressure sensation over her bladder.  She denies any gross hematuria.  No fevers or chills.  No back pain.  No nausea or vomiting.  No decreased oral intake.  He went to the pharmacy and took a test for urinary tract infection and states that this was positive.  Review of Systems  Constitutional: Negative.   HENT: Negative.    Respiratory: Negative.    Cardiovascular: Negative.   Gastrointestinal: Negative.  Negative for nausea and vomiting.  Genitourinary:  Positive for dysuria, frequency and urgency. Negative for hematuria.  Musculoskeletal: Negative.   Psychiatric/Behavioral: Negative.          Objective:    BP 120/60   Pulse 64   Ht 5\' 2"  (1.575 m)   Wt 123 lb 6.4 oz (56 kg)   SpO2 95%   BMI 22.57 kg/m    Physical Exam Constitutional:      Appearance: Normal appearance.  HENT:     Head: Normocephalic and atraumatic.  Cardiovascular:     Rate and Rhythm: Normal rate and regular rhythm.  Pulmonary:     Effort: Pulmonary effort is normal. No respiratory distress.     Breath sounds: Normal breath sounds. No wheezing or rales.  Abdominal:     General: Bowel sounds are normal. There is no distension.     Tenderness: There is no abdominal tenderness. There is no right CVA tenderness, left CVA tenderness, guarding or rebound.  Neurological:     Mental Status: She is alert.  Psychiatric:        Mood and Affect: Mood normal.        Behavior: Behavior normal.     Results for orders placed or performed  in visit on 04/04/24  POCT Urinalysis Dipstick (Automated)  Result Value Ref Range   Color, UA dark yellow    Clarity, UA clear    Glucose, UA Negative Negative   Bilirubin, UA negative    Ketones, UA trace    Spec Grav, UA >=1.030 (A) 1.010 - 1.025   Blood, UA moderate    pH, UA 5.5 5.0 - 8.0   Protein, UA Positive (A) Negative   Urobilinogen, UA 0.2 0.2 or 1.0 E.U./dL   Nitrite, UA negative    Leukocytes, UA Trace (A) Negative        Assessment & Plan:   Problem List Items Addressed This Visit       Genitourinary   UTI (urinary tract infection)   - Patient is here today for UTI symptoms.  Patient states that over the last 3 to 4 days she has noted dysuria, increased frequency, urgency -No fevers or chills noted.  No signs of pyelonephritis -She went to the pharmacy and took this for a UTI and states that this was positive -On exam, patient had no abdominal tenderness or CVA tenderness -Her urine dipstick showed moderate blood as well as trace leukocytes consistent with urinary tract infection -I have reviewed her prior lab work  including her BMP which showed a decreased GFR of 45.  Patient is not a candidate for nitrofurantoin -Patient is also not a candidate for Bactrim as she has an allergy to sulfa drugs -Will treat the patient with Keflex  500 mg twice daily (chart reviewed and this has been prescribed to the patient before without any evidence of side effects)      Relevant Medications   cephALEXin  (KEFLEX ) 500 MG capsule   Other Visit Diagnoses       Urine frequency    -  Primary   Relevant Orders   POCT Urinalysis Dipstick (Automated) (Completed)   Urine Culture       Meds ordered this encounter  Medications   cephALEXin  (KEFLEX ) 500 MG capsule    Sig: Take 1 capsule (500 mg total) by mouth 2 (two) times daily.    Dispense:  10 capsule    Refill:  0    No follow-ups on file.  Sheffield Hawker, MD

## 2024-04-06 ENCOUNTER — Ambulatory Visit: Attending: Internal Medicine

## 2024-04-06 LAB — URINE CULTURE
MICRO NUMBER:: 16443016
SPECIMEN QUALITY:: ADEQUATE

## 2024-04-06 NOTE — Therapy (Incomplete)
 OUTPATIENT PHYSICAL THERAPY SHOULDER/ELBOW EVALUATION  Patient Name: Emily Wu MRN: [id number] DOB:[birth year], 89 y.o., female Today's Date: 04/06/2024  END OF SESSION:   Past Medical History:  Diagnosis Date   Arthritis    "knees, shoulders, chest" (02/03/2014)   Bruises easily    Cervical cancer [facility]) 1965   Concussion 03/11/2020   High cholesterol    History of kidney stones    Hypertension    DENIES    Tendency toward bleeding easily [facility])    Past Surgical History:  Procedure Laterality Date   ABDOMINAL HYSTERECTOMY  1965   APPENDECTOMY     CHOLECYSTECTOMY     KNEE ARTHROSCOPY Left    [facility]   PARTIAL KNEE ARTHROPLASTY Left 01/26/2019   Procedure: UNICOMPARTMENTAL KNEE;  Surgeon: Osa Blase, MD;  Location: [facility] OR;  Service: Orthopedics;  Laterality: Left;   PARTIAL KNEE ARTHROPLASTY Right 12/10/2021   Procedure: UNICOMPARTMENTAL KNEE;  Surgeon: Osa Blase, MD;  Location: [facility] ORS;  Service: Orthopedics;  Laterality: Right;   SHOULDER OPEN ROTATOR CUFF REPAIR Bilateral 2007,  2010   [place]   THROAT SURGERY  2014   "arthritis hugh knot" (02/03/2014)   Patient Active Problem List   Diagnosis Date Noted   Acute pain of right shoulder 04/03/2024   Cellulitis, umbilical 10/28/2023   Frequent falls 09/24/2023   Dark stools 08/24/2023   Anemia 01/04/2022   S/P right unicompartmental knee replacement 12/10/2021   Other fatigue 10/22/2021   Exocrine pancreatic insufficiency 10/22/2021   Encounter for preoperative assessment 09/11/2021   Aortic atherosclerosis (HCC) 09/11/2021   Mixed Alzheimer's and vascular dementia (HCC) 09/13/2020   Generalized muscle weakness 09/13/2020   UTI (urinary tract infection) 08/09/2020   Pulmonary nodule less than 1 cm in diameter with low risk for malignant neoplasm 07/26/2020   Depression 07/26/2020   B12 deficiency 07/26/2020   Acquired kyphosis 04/26/2020   Widowed 03/11/2020   Osteoarthritis of right knee 08/26/2019   S/P left  unicompartmental knee replacement 01/26/2019   Hospital discharge follow-up 12/26/2018   Incidental pulmonary nodule, > 3mm and < 8mm 12/26/2018   Chronic kidney disease (CKD), stage III (moderate) 12/26/2018   Left bundle branch block (LBBB) determined by electrocardiography 12/02/2018   GERD (gastroesophageal reflux disease) 09/29/2018   Anxiety disorder 03/25/2018   Diarrhea due to malabsorption 04/18/2017   Primary osteoarthritis of right knee 10/13/2016   Chronic pain of right knee 05/27/2016   Knee pain, bilateral 05/27/2016   Exertional dyspnea 04/15/2016   Left carotid bruit 04/14/2016   Menopausal hot flushes 08/07/2015   H/O malignant neoplasm of skin 06/25/2015   Hypothyroidism 01/31/2015   History of cervical cancer 01/10/2015   Hyperlipidemia 02/03/2014   Medicare annual wellness visit, subsequent 01/19/2014   History of IBS 01/19/2014   S/P breast augmentation 01/19/2014   Primary osteoarthritis of left knee 07/06/2012   Colon polyps 01/30/2012   Low back pain 01/30/2012    PCP: Thersia Flax, MD  REFERRING PROVIDER:  Tona Francis, NP    REFERRING DIAG:  M25.511 (ICD-10-CM) - Acute pain of right shoulder   RATIONALE FOR EVALUATION AND TREATMENT: Rehabilitation  THERAPY DIAG: No diagnosis found.  ONSET DATE: ***  FOLLOW-UP APPT SCHEDULED WITH REFERRING PROVIDER: {yes/no:20286}    SUBJECTIVE:  SUBJECTIVE STATEMENT:  R Shoulder Pain  PERTINENT HISTORY:   She has been experiencing shooting pain in her right shoulder for the past month, triggered by lifting her arm or reaching for objects. The pain is described as a shooting sensation lasting a few seconds, localized to the R deltoid region. Pain aggravated with specific lifting or overhead motions. She has a history of  rotator cuff surgery (2007).  She lives alone and receives home care assistance from [facility] [employer], a service she has been using since January 2021 after her husband's passing. Caregivers assist with housework, transportation, and maintaining her daily activities, including outings for lunch and gardening. Patient denies recent falls or direct trauma to the right shoulder.  PAIN:  Pain Intensity: Present: /10, Best: /10, Worst: /10 Pain location: *** Pain Quality: {PAIN DESCRIPTION:21022940}  Radiating: {yes/no:20286}  Numbness/Tingling: {yes/no:20286} Focal Weakness: {yes/no:20286} Aggravating factors: *** Relieving factors: *** 24-hour pain behavior: *** History of prior shoulder or neck/shoulder injury, pain, surgery, or therapy: {yes/no:20286} Falls: Has patient fallen in last 6 months? {yes/no:20286}, Number of falls: *** Dominant hand: {RIGHT/LEFT:20294} Imaging: {yes/no:20286}  Red flags (personal history of cancer, chills/fever, night sweats, nausea, vomiting, unrelenting pain, unexplained weight gain/loss): Negative  PRECAUTIONS: {Therapy precautions:24002}  WEIGHT BEARING RESTRICTIONS: {Yes ***/No:24003}  FALLS: Has patient fallen in last 6 months? {fallsyesno:27318}  Living Environment Lives with: {OPRC lives with:25569::"lives with their family"} Lives in: {Lives in:25570} Stairs: {opstairs:27293} Has following equipment at home: {Assistive devices:23999}  Prior level of function: {PLOF:24004}  Occupational demands:   Hobbies:   Patient Goals: ***    OBJECTIVE:   Patient Surveys  FOTO: QuickDASH:  Cognition Patient is oriented to person, place, and time.  Recent memory is intact.  Remote memory is intact.  Attention span and concentration are intact.  Expressive speech is intact.  Patient's fund of knowledge is within normal limits for educational level.    Gross Musculoskeletal Assessment Tremor: None Bulk: Normal Tone:  Normal  Gait   Posture   Cervical Screen AROM: WFL and painless with overpressure in all planes Spurlings A (ipsilateral lateral flexion/axial compression): R: {NEGATIVE/POSITIVE ZOX:09604} L: {NEGATIVE/POSITIVE VWU:98119} Spurlings B (ipsilateral lateral flexion/contralateral rotation/axial compression): R: {NEGATIVE/POSITIVE JYN:82956} L: {NEGATIVE/POSITIVE OZH:08657} Repeated movement: No centralization or peripheralization with protraction or retraction Hoffman Sign (cervical cord compression): R: {NEGATIVE/POSITIVE QIO:96295} L: {NEGATIVE/POSITIVE MWU:13244} ULTT Median: R: {NEGATIVE/POSITIVE WNU:27253} L: {NEGATIVE/POSITIVE GUY:40347} ULTT Ulnar: R: {NEGATIVE/POSITIVE QQV:95638} L: {NEGATIVE/POSITIVE VFI:43329} ULTT Radial: R: {NEGATIVE/POSITIVE JJO:84166} L: {NEGATIVE/POSITIVE AYT:01601}  AROM AROM (Normal range in degrees) AROM  Cervical  Flexion (50)   Extension (80)   Right lateral flexion (45)   Left lateral flexion (45)   Right rotation (85)   Left rotation (85)    Right Left  Shoulder    Flexion    Extension    Abduction    External Rotation    Internal Rotation    Hands Behind Head    Hands Behind Back        Elbow    Flexion    Extension    Pronation    Supination    (* = pain; Blank rows = not tested)  UE MMT: MMT (out of 5) Right Left   Cervical (isometric)  Flexion WNL  Extension WNL  Lateral Flexion WNL WNL  Rotation WNL WNL      Shoulder   Flexion    Extension    Abduction    External rotation    Internal rotation    Horizontal abduction  Horizontal adduction    Lower Trapezius    Rhomboids        Elbow  Flexion    Extension    Pronation    Supination        Wrist  Flexion    Extension    Radial deviation    Ulnar deviation        MCP  Flexion    Extension    Abduction    Adduction    (* = pain; Blank rows = not tested)  Sensation Grossly intact to light touch bilateral UE as determined by testing dermatomes  C2-T2. Proprioception and hot/cold testing deferred on this date.  Reflexes R/L Biceps (L3/4): 2+/2+  Triceps (S1/2): 2+/2+  Brachioradialis: 2+/2  Palpation Location LEFT  RIGHT           Subocciptials    Cervical paraspinals    Upper Trapezius    Levator Scapulae    Rhomboid Major/Minor    Sternoclavicular joint    Acromioclavicular joint    Coracoid process    Long head of biceps    Supraspinatus    Infraspinatus    Subscapularis    Teres Minor    Teres Major    Pectoralis Major    Pectoralis Minor    Anterior Deltoid    Lateral Deltoid    Posterior Deltoid    Latissimus Dorsi    Sternocleidomastoid    (Blank rows = not tested) Graded on 0-4 scale (0 = no pain, 1 = pain, 2 = pain with wincing/grimacing/flinching, 3 = pain with withdrawal, 4 = unwilling to allow palpation), (Blank rows = not tested)   Passive Accessory Intervertebral Motion Pt denies reproduction of shoulder pain with CPA C2-T7 and UPA bilaterally C2-T7. Generally, hypomobile throughout  Accessory Motions/Glides Glenohumeral: Posterior: R: {normal/abnormal/not examined:14677} L: {normal/abnormal/not examined:14677} Inferior: R: {normal/abnormal/not examined:14677} L: {normal/abnormal/not examined:14677} Anterior: R: {normal/abnormal/not examined:14677} L: {normal/abnormal/not examined:14677}  Acromioclavicular:  Posterior: R: {normal/abnormal/not examined:14677} L: {normal/abnormal/not examined:14677} Anterior: R: {normal/abnormal/not examined:14677} L: {normal/abnormal/not examined:14677}  Sternoclavicular: Posterior: R: {normal/abnormal/not examined:14677} L: {normal/abnormal/not examined:14677} Anterior: R: {normal/abnormal/not examined:14677} L: {normal/abnormal/not examined:14677} Superior: R: {normal/abnormal/not examined:14677} L: {normal/abnormal/not examined:14677} Inferior: R: {normal/abnormal/not examined:14677} L: {normal/abnormal/not examined:14677}  Scapulothoracic: Distraction:  R: {normal/abnormal/not examined:14677} L: {normal/abnormal/not examined:14677} Medial: R: {normal/abnormal/not examined:14677} L: {normal/abnormal/not examined:14677} Lateral: R: {normal/abnormal/not examined:14677} L: {normal/abnormal/not examined:14677} Inferior: R: {normal/abnormal/not examined:14677} L: {normal/abnormal/not examined:14677} Superior: R: {normal/abnormal/not examined:14677} L: {normal/abnormal/not examined:14677} Upward Rotation: R: {normal/abnormal/not examined:14677} L: {normal/abnormal/not examined:14677} Downward Rotation: R: {normal/abnormal/not examined:14677} L: {normal/abnormal/not examined:14677}  Muscle Length Testing Pectoralis Major: R: {normal/abnormal/not examined:14677} L: {normal/abnormal/not examined:14677} Pectoralis Minor: R: {normal/abnormal/not examined:14677} L: {normal/abnormal/not examined:14677} Biceps: R: {normal/abnormal/not examined:14677} L: {normal/abnormal/not examined:14677}  SPECIAL TESTS Rotator Cuff  Drop Arm Test: {NEGATIVE/POSITIVE ZHY:86578} Painful Arc (Pain from 60 to 120 degrees scaption): {NEGATIVE/POSITIVE ION:62952} Infraspinatus Muscle Test: {NEGATIVE/POSITIVE WUX:32440} If all 3 tests positive, the probability of a full-thickness rotator cuff tear is 91%  Subacromial Impingement Hawkins-Kennedy: {NEGATIVE/POSITIVE NUU:72536} Neer (Block scapula, PROM flexion): {NEGATIVE/POSITIVE UYQ:03474} Painful Arc (Pain from 60 to 120 degrees scaption): {NEGATIVE/POSITIVE QVZ:56387} Empty Can: {NEGATIVE/POSITIVE FIE:33295} External Rotation Resistance: {NEGATIVE/POSITIVE JOA:41660} Horizontal Adduction: {NEGATIVE/POSITIVE YTK:16010} Scapular Assist: {NEGATIVE/POSITIVE XNA:35573} Positive Hawkins-Kennedy, Painful arc sign, Infraspinatus muscle test then +LR: 10.56 of some type of impingement present, 2/3 tests: +LR 5.06, -LR 0.17, Positive 3/5 Hawkins-Kennedy, neer, painful arc, empty can, and external rotation resistance then SN: .75  (.54-.96) SP: .74 (.61-.88) +LR: 2.93 (1.60-5.36) -LR: .34 (.14-.80)  Labral Tear Biceps Load II (120 elevation, full ER, 90  elbow flexion, full supination, resisted elbow flexion): {NEGATIVE/POSITIVE FOR:19998} Crank (160 scaption, axial load with IR/ER): {NEGATIVE/POSITIVE ZOX:09604} O'Briens/Active Compression Test (90 shoulder flexion, 10 adduction, full IR): {NEGATIVE/POSITIVE VWU:98119}  Bicep Tendon Pathology Speed (shoulder flexion to 90, external rotation, full elbow extension, and forearm supination with resistance: {NEGATIVE/POSITIVE JYN:82956} Yergason's (resisted shoulder ER and supination/biceps tendon pathology): {NEGATIVE/POSITIVE OZH:08657}  Shoulder Instability Sulcus Sign: {NEGATIVE/POSITIVE QIO:96295} Anterior Apprehension: {NEGATIVE/POSITIVE MWU:13244}  Beighton scale  LEFT  RIGHT           1. Passive dorsiflexion and hyperextension of the fifth MCP joint beyond 90  0 0   2. Passive apposition of the thumb to the flexor aspect of the forearm  0  0   3. Passive hyperextension of the elbow beyond 10  0  0   4. Passive hyperextension of the knee beyond 10  0  0   5. Active forward flexion of the trunk with the knees fully extended so that the palms of the hands rest flat on the floor   0   TOTAL         0/ 9      TODAY'S TREATMENT  ***   PATIENT EDUCATION:  Education details: *** Person educated: {Person educated:25204} Education method: {Education Method:25205} Education comprehension: {Education Comprehension:25206}   HOME EXERCISE PROGRAM:    ASSESSMENT:  CLINICAL IMPRESSION: Patient is a *** y.o. *** who was seen today for physical therapy evaluation and treatment for ***.   OBJECTIVE IMPAIRMENTS: {opptimpairments:25111}.   ACTIVITY LIMITATIONS: {activitylimitations:27494}  PARTICIPATION LIMITATIONS: {participationrestrictions:25113}  PERSONAL FACTORS: {Personal factors:25162} are also affecting patient's functional outcome.   REHAB  POTENTIAL: {rehabpotential:25112}  CLINICAL DECISION MAKING: {clinical decision making:25114}  EVALUATION COMPLEXITY: {Evaluation complexity:25115}   GOALS: Goals reviewed with patient? Yes  SHORT TERM GOALS: Target date: {follow up:25551}  Pt will be independent with HEP to improve strength and decrease shoulder pain to improve pain-free function at home and work. Baseline: *** Goal status: INITIAL   LONG TERM GOALS: Target date: {follow up:25551}  Pt will increase FOTO to at least *** to demonstrate significant improvement in function at home and work related to shoulder pain  Baseline:  Goal status: INITIAL  2.  Pt will decrease worst shoulder pain by at least 3 points on the NPRS in order to demonstrate clinically significant reduction in shoulder pain. Baseline: *** Goal status: INITIAL  3.  Pt will decrease quick DASH score by at least 8% in order to demonstrate clinically significant reduction in disability related to shoulder pain        Baseline: *** Goal status: INITIAL  4. Pt will increase strength by at least 1/2 MMT grade in order to demonstrate improvement in strength and function         Baseline: *** Goal status: INITIAL   PLAN: PT FREQUENCY: 1-2x/week  PT DURATION: {rehab duration:25117}  PLANNED INTERVENTIONS: Therapeutic exercises, Therapeutic activity, Neuromuscular re-education, Balance training, Gait training, Patient/Family education, Self Care, Joint mobilization, Joint manipulation, Vestibular training, Canalith repositioning, Orthotic/Fit training, DME instructions, Dry Needling, Electrical stimulation, Spinal manipulation, Spinal mobilization, Cryotherapy, Moist heat, Taping, Traction, Ultrasound, Ionotophoresis 4mg /ml Dexamethasone , Manual therapy, and Re-evaluation.  PLAN FOR NEXT SESSION: ***   Candi Chafe PT, DPT, GCS  Marine Sia Dusty Wagoner, PT 04/06/2024, 11:09 AM

## 2024-04-07 ENCOUNTER — Ambulatory Visit: Payer: Self-pay

## 2024-04-07 ENCOUNTER — Encounter: Admitting: Physical Therapy

## 2024-04-07 ENCOUNTER — Ambulatory Visit: Admitting: Nurse Practitioner

## 2024-04-11 ENCOUNTER — Ambulatory Visit

## 2024-04-13 ENCOUNTER — Encounter: Admitting: Physical Therapy

## 2024-04-13 ENCOUNTER — Encounter

## 2024-04-20 DIAGNOSIS — C44311 Basal cell carcinoma of skin of nose: Secondary | ICD-10-CM | POA: Diagnosis not present

## 2024-04-20 DIAGNOSIS — L578 Other skin changes due to chronic exposure to nonionizing radiation: Secondary | ICD-10-CM | POA: Diagnosis not present

## 2024-04-20 DIAGNOSIS — L814 Other melanin hyperpigmentation: Secondary | ICD-10-CM | POA: Diagnosis not present

## 2024-04-25 ENCOUNTER — Encounter: Admitting: Physical Therapy

## 2024-04-27 ENCOUNTER — Encounter: Admitting: Physical Therapy

## 2024-04-27 ENCOUNTER — Encounter

## 2024-05-04 ENCOUNTER — Encounter

## 2024-05-04 ENCOUNTER — Encounter: Admitting: Physical Therapy

## 2024-05-06 ENCOUNTER — Other Ambulatory Visit: Payer: Self-pay | Admitting: Internal Medicine

## 2024-05-10 ENCOUNTER — Encounter

## 2024-05-10 ENCOUNTER — Encounter: Admitting: Physical Therapy

## 2024-05-12 ENCOUNTER — Encounter: Admitting: Physical Therapy

## 2024-05-17 ENCOUNTER — Encounter: Admitting: Physical Therapy

## 2024-05-18 ENCOUNTER — Other Ambulatory Visit: Payer: Self-pay | Admitting: Internal Medicine

## 2024-05-18 ENCOUNTER — Encounter

## 2024-05-19 ENCOUNTER — Encounter

## 2024-05-24 ENCOUNTER — Encounter

## 2024-05-30 ENCOUNTER — Encounter

## 2024-06-01 ENCOUNTER — Encounter

## 2024-06-03 ENCOUNTER — Ambulatory Visit: Payer: Self-pay

## 2024-06-03 NOTE — Telephone Encounter (Signed)
 FYI Only or Action Required?: Action required by provider: Refusing UC since no PCP availability, needs earlier appt and call back with further recommendations.  Patient was last seen in primary care on 04/04/2024 by Onesimo Claude, MD.  Called Nurse Triage reporting Diarrhea, Abdominal Pain, Rectal Pain, skin breakdown to rectal area, and Fatigue.  Symptoms began several days ago.  Interventions attempted: OTC medications: vaseline for rectum and Rest, hydration, or home remedies.  Symptoms are: gradually worsening.  Triage Disposition: See Physician Within 24 Hours  Patient/caregiver understands and will follow disposition?: No, refuses disposition     Reason for Disposition  [1] MODERATE diarrhea (e.g., 4-6 times / day more than normal) AND [2] age > 70 years  Answer Assessment - Initial Assessment Questions Advised pt be examined in next 24 hours, no PCP office availability until 7/21, advised pt go to UC in meantime, pt refusing UC at this time. Advised pt consume bland diet, avoid acidic/spicy foods, drink lots of water , water  down pedialyte/gatorade to prevent worsening diarrhea from full-strength use of hydration drink, wash rectal area with soap and water  frequently to reduce irritation from stool residue then pat dry and utilize barrier cream. Advised pt seek immediate care if any worsening diarrhea or abdominal pain, feeling worse. Sending message to request call back to pt with earlier appt options and further recommendations. Advised strongly that pt go to UC.    1. DIARRHEA SEVERITY: How bad is the diarrhea? How many more stools have you had in the past 24 hours than normal?      Don't know, couple this morning, couple accidents too couldn't get to bathroom on time 2. ONSET: When did the diarrhea begin?      Started 2 days ago, went into bathroom and sat down and it was like my bowels just exploded, waste was up on wall the floor, just horrible, since then still  hurting, should've called that down, still uncomfortable, never had anything like that happen to me in my life, rectum is painful 3. STOOL DESCRIPTION:  How loose or watery is the diarrhea? What is the stool color? Is there any blood or mucous in the stool?     Still having loose stools 4. VOMITING: Are you also vomiting? If Yes, ask: How many times in the past 24 hours?      no 5. ABDOMEN PAIN: Are you having any abdomen pain? If Yes, ask: What does it feel like? (e.g., crampy, dull, intermittent, constant)      Just hurting in lower part of my stomach 6. ABDOMEN PAIN SEVERITY: If present, ask: How bad is the pain?  (e.g., Scale 1-10; mild, moderate, or severe)     Rectum painful been severe, abdominal pain don't know what level, just hurts, old-fashioned stomach ache Been in bed since 7 pm last night Comes and goes 7. ORAL INTAKE: If vomiting, Have you been able to drink liquids? How much liquids have you had in the past 24 hours?     Just eating my normal food 8. HYDRATION: Any signs of dehydration? (e.g., dry mouth [not just dry lips], too weak to stand, dizziness, new weight loss) When did you last urinate?     No dry mouth Not too weak to stand, just feel washed out Sure I weigh less than I did a few days ago Staying in bed, will not answer if feeling woozy/dizzy/lightheaded 9. EXPOSURE: Have you traveled to a foreign country recently? Have you been exposed to anyone with diarrhea?  Could you have eaten any food that was spoiled?     no 10. ANTIBIOTIC USE: Are you taking antibiotics now or have you taken antibiotics in the past 2 months?       no 11. OTHER SYMPTOMS: Do you have any other symptoms? (e.g., fever, blood in stool)       Rectum really raw, really uncomfortable, using vaseline No blood in stool Not taken temp, don't think fever  Protocols used: Diarrhea-A-AH

## 2024-06-03 NOTE — Telephone Encounter (Signed)
 Phone breaking up badly for pt, pt cannot hear nurse well. Nurse restarting and will ensure call back to pt.      Copied from CRM [id number]. Topic: Clinical - Red Word Triage >> Jun 03, 2024 12:10 PM Suzen RAMAN wrote: Red Word that prompted transfer to Nurse Triage: Diarrhea and stomach pain

## 2024-06-03 NOTE — Telephone Encounter (Signed)
 Spoke to pot she stated that she did have an episode or blowout on Wednesday, but she has not had another one since then, she has had a couple loose stools, no abdominal pain, bottom is a little red but she did not feel that she needed to go to ED or UC. Pt stated that she could wait until next week, I scheduled her for in office with Charan 06/09/24. Because that was only day she could get here due to her transportation

## 2024-06-06 ENCOUNTER — Encounter

## 2024-06-08 ENCOUNTER — Encounter

## 2024-06-09 ENCOUNTER — Encounter: Payer: Self-pay | Admitting: Nurse Practitioner

## 2024-06-09 ENCOUNTER — Ambulatory Visit: Admitting: Nurse Practitioner

## 2024-06-09 VITALS — BP 120/60 | HR 51 | Temp 98.1°F | Resp 20 | Ht 62.0 in | Wt 124.4 lb

## 2024-06-09 DIAGNOSIS — R197 Diarrhea, unspecified: Secondary | ICD-10-CM | POA: Diagnosis not present

## 2024-06-09 DIAGNOSIS — R152 Fecal urgency: Secondary | ICD-10-CM | POA: Insufficient documentation

## 2024-06-09 DIAGNOSIS — R140 Abdominal distension (gaseous): Secondary | ICD-10-CM | POA: Insufficient documentation

## 2024-06-09 DIAGNOSIS — R141 Gas pain: Secondary | ICD-10-CM | POA: Insufficient documentation

## 2024-06-09 LAB — COMPREHENSIVE METABOLIC PANEL WITH GFR
ALT: 17 U/L (ref 0–35)
AST: 23 U/L (ref 0–37)
Albumin: 4.2 g/dL (ref 3.5–5.2)
Alkaline Phosphatase: 58 U/L (ref 39–117)
BUN: 13 mg/dL (ref 6–23)
CO2: 32 meq/L (ref 19–32)
Calcium: 9.6 mg/dL (ref 8.4–10.5)
Chloride: 104 meq/L (ref 96–112)
Creatinine, Ser: 0.97 mg/dL (ref 0.40–1.20)
GFR: 51.24 mL/min — ABNORMAL LOW (ref >60.00)
Glucose, Bld: 92 mg/dL (ref 70–99)
Potassium: 4.4 meq/L (ref 3.5–5.1)
Sodium: 142 meq/L (ref 135–145)
Total Bilirubin: 0.4 mg/dL (ref 0.2–1.2)
Total Protein: 6.7 g/dL (ref 6.0–8.3)

## 2024-06-09 LAB — CBC WITH DIFFERENTIAL/PLATELET
Basophils Absolute: 0 K/uL (ref 0.0–0.1)
Basophils Relative: 0.7 % (ref 0.0–3.0)
Eosinophils Absolute: 0.1 K/uL (ref 0.0–0.7)
Eosinophils Relative: 1.7 % (ref 0.0–5.0)
HCT: 38.4 % (ref 36.0–46.0)
Hemoglobin: 13.1 g/dL (ref 12.0–15.0)
Lymphocytes Relative: 42.4 % (ref 12.0–46.0)
Lymphs Abs: 2.3 K/uL (ref 0.7–4.0)
MCHC: 34.2 g/dL (ref 30.0–36.0)
MCV: 94.9 fl (ref 78.0–100.0)
Monocytes Absolute: 0.6 K/uL (ref 0.1–1.0)
Monocytes Relative: 10.3 % (ref 3.0–12.0)
Neutro Abs: 2.5 K/uL (ref 1.4–7.7)
Neutrophils Relative %: 44.9 % (ref 43.0–77.0)
Platelets: 233 K/uL (ref 150.0–400.0)
RBC: 4.04 Mil/uL (ref 3.87–5.11)
RDW: 13.7 % (ref 11.5–15.5)
WBC: 5.5 K/uL (ref 4.0–10.5)

## 2024-06-09 NOTE — Progress Notes (Signed)
 Established Patient Office Visit  Subjective:  Patient ID: Emily Wu, female    DOB: 07-05-1931  Age: 89 y.o. MRN: [id number]  CC:  Chief Complaint  Patient presents with   Diarrhea    X a few weeks and each day its a  lot   Discussed the use of a AI scribe software for clinical note transcription with the patient, who gave verbal consent to proceed.   HPI  Emily Wu presents for acute visit with diarrhea from last 1 week.  She has experienced loose bowel movements and diarrhea for the past week with multiple episodes daily.  He started taking the medication that have decreased the frequency but do not remember the name.   She denies nausea vomiting, blood in stool or Abdominal pain Though she had stomach soreness after severe episode last week.  She has not started on any but antibiotic or any change in her recent medication.  Her diet include various foods provided by her daughter such as veggies and green tomatoes with no identified food triggers.  She maintained a good appetite and adequate fluid intake     Past Medical History:  Diagnosis Date   Arthritis    knees, shoulders, chest (02/03/2014)   Bruises easily    Cervical cancer [facility]) 1965   Concussion 03/11/2020   High cholesterol    History of kidney stones    Hypertension    DENIES    Tendency toward bleeding easily [facility])     Past Surgical History:  Procedure Laterality Date   ABDOMINAL HYSTERECTOMY  1965   APPENDECTOMY     CHOLECYSTECTOMY     KNEE ARTHROSCOPY Left    [facility]   PARTIAL KNEE ARTHROPLASTY Left 01/26/2019   Procedure: UNICOMPARTMENTAL KNEE;  Surgeon: Josefina Chew, MD;  Location: [facility] OR;  Service: Orthopedics;  Laterality: Left;   PARTIAL KNEE ARTHROPLASTY Right 12/10/2021   Procedure: UNICOMPARTMENTAL KNEE;  Surgeon: Josefina Chew, MD;  Location: [facility] ORS;  Service: Orthopedics;  Laterality: Right;   SHOULDER OPEN ROTATOR CUFF REPAIR Bilateral 2007,  2010   [facility]   THROAT SURGERY   2014   arthritis hugh knot (02/03/2014)    Family History  Problem Relation Age of Onset   Peripheral vascular disease Mother    Stroke Father    Hyperlipidemia Father    Heart disease Father 20   Cancer Neg Hx     Social History   Socioeconomic History   Marital status: Married    Spouse name: Not on file   Number of children: Not on file   Years of education: Not on file   Highest education level: Not on file  Occupational History   Not on file  Tobacco Use   Smoking status: Former    Current packs/day: 0.00    Average packs/day: 2.0 packs/day for 7.0 years (14.0 ttl pk-yrs)    Types: Cigarettes    Start date: 31    Quit date: 95    Years since quitting: 65.6   Smokeless tobacco: Never   Tobacco comments:    02/03/2014 quit smoking in the 1960's  Vaping Use   Vaping status: Never Used  Substance and Sexual Activity   Alcohol use: No   Drug use: No   Sexual activity: Never  Other Topics Concern   Not on file  Social History Narrative   Widowed   Has a caregiver 6 days a week   Social Drivers of Corporate investment banker  Strain: Low Risk  (06/26/2023)   Overall Financial Resource Strain (CARDIA)    Difficulty of Paying Living Expenses: Not hard at all  Food Insecurity: No Food Insecurity (06/26/2023)   Hunger Vital Sign    Worried About Running Out of Food in the Last Year: Never true    Ran Out of Food in the Last Year: Never true  Transportation Needs: No Transportation Needs (06/26/2023)   PRAPARE - Administrator, Civil Service (Medical): No    Lack of Transportation (Non-Medical): No  Physical Activity: Inactive (06/26/2023)   Exercise Vital Sign    Days of Exercise per Week: 0 days    Minutes of Exercise per Session: 0 min  Stress: No Stress Concern Present (06/26/2023)   [employer] of Occupational Health - Occupational Stress Questionnaire    Feeling of Stress : Only a little  Social Connections: Socially Isolated (06/26/2023)    Social Connection and Isolation Panel    Frequency of Communication with Friends and Family: More than three times a week    Frequency of Social Gatherings with Friends and Family: More than three times a week    Attends Religious Services: Never    Database administrator or Organizations: No    Attends Banker Meetings: Never    Marital Status: Widowed  Intimate Partner Violence: Not At Risk (06/26/2023)   Humiliation, Afraid, Rape, and Kick questionnaire    Fear of Current or Ex-Partner: No    Emotionally Abused: No    Physically Abused: No    Sexually Abused: No     Outpatient Medications Prior to Visit  Medication Sig Dispense Refill   ACETAMINOPHEN  ER PO Take 500 mg by mouth daily.     cephALEXin  (KEFLEX ) 500 MG capsule Take 1 capsule (500 mg total) by mouth 2 (two) times daily. 10 capsule 0   diphenoxylate-atropine (LOMOTIL) 2.5-0.025 MG tablet Take 1 tablet each morning with breakfast.  You may take an additional tablet during the day with each loose stool for a maximum of 4 tablets per day.     docusate sodium  (COLACE) 100 MG capsule Take 1 capsule (100 mg total) by mouth 2 (two) times daily. To prevent constipation while taking pain medication. 30 capsule 0   donepezil  (ARICEPT ) 10 MG tablet Take 10 mg by mouth at bedtime.     ipratropium (ATROVENT ) 0.06 % nasal spray USE TWO SPRAYS INTO BOTH NOSTRILS FOUR TIMES DAILY FOR RUNNY NOSE 15 mL 2   ketorolac  (ACULAR ) 0.5 % ophthalmic solution SMARTSIG:In Eye(s)     liothyronine  (CYTOMEL ) 5 MCG tablet TAKE 1 TABLET BY MOUTH ONCE DAILY AS DIRECTED. 90 tablet 0   mirtazapine  (REMERON ) 30 MG tablet TAKE 1 TABLET BY MOUTH AT BEDTIME AS DIRECTED. 90 tablet 0   moxifloxacin (VIGAMOX) 0.5 % ophthalmic solution Apply to eye.     mupirocin  ointment (BACTROBAN ) 2 % Apply 1 Application topically 2 (two) times daily. 22 g 0   nystatin -triamcinolone  ointment (MYCOLOG) Apply 1 Application topically 2 (two) times daily. 30 g 0    prednisoLONE acetate (PRED FORTE) 1 % ophthalmic suspension SMARTSIG:In Eye(s)     rosuvastatin  (CRESTOR ) 10 MG tablet TAKE 1 TABLET BY MOUTH DAILY 90 tablet 3   tiZANidine (ZANAFLEX) 4 MG tablet Take 4 mg by mouth every 6 (six) hours as needed.     vitamin B-12 (CYANOCOBALAMIN ) 1000 MCG tablet Take 1,000 mcg by mouth daily.     No facility-administered medications prior to visit.  Allergies  Allergen Reactions   Tramadol  Anaphylaxis and Other (See Comments)    Lowered Blood Pressure   Amoxicillin Rash   Promethazine Hcl Rash   Sulfa Drugs Cross Reactors Rash    ROS Review of Systems  Gastrointestinal:  Positive for diarrhea.   Negative unless indicated in HPI.    Objective:    Physical Exam Constitutional:      Appearance: Normal appearance.  Cardiovascular:     Rate and Rhythm: Normal rate and regular rhythm.     Pulses: Normal pulses.     Heart sounds: Normal heart sounds.  Abdominal:     General: Bowel sounds are normal.     Palpations: Abdomen is soft.     Tenderness: There is no abdominal tenderness. There is no right CVA tenderness or left CVA tenderness.  Musculoskeletal:     Cervical back: Normal range of motion.  Neurological:     General: No focal deficit present.     Mental Status: She is alert. Mental status is at baseline.  Psychiatric:        Mood and Affect: Mood normal.        Behavior: Behavior normal.        Thought Content: Thought content normal.        Judgment: Judgment normal.     BP 120/60   Pulse (!) 51   Temp 98.1 F (36.7 C)   Resp 20   Ht 5' 2 (1.575 m)   Wt 124 lb 6 oz (56.4 kg)   SpO2 97%   BMI 22.75 kg/m  Wt Readings from Last 3 Encounters:  06/09/24 124 lb 6 oz (56.4 kg)  04/04/24 123 lb 6.4 oz (56 kg)  03/18/24 124 lb 12.8 oz (56.6 kg)     Health Maintenance  Topic Date Due   DTaP/Tdap/Td (2 - Td or Tdap) 11/08/2023   Medicare Annual Wellness (AWV)  06/25/2024   Zoster Vaccines- Shingrix (2 of 2) 09/09/2024  (Originally 09/29/2018)   INFLUENZA VACCINE  06/24/2024   Pneumococcal Vaccine: 50+ Years  Completed   DEXA SCAN  Completed   Hepatitis B Vaccines  Aged Out   HPV VACCINES  Aged Out   Meningococcal B Vaccine  Aged Out   MAMMOGRAM  Discontinued   COVID-19 Vaccine  Discontinued    There are no preventive care reminders to display for this patient.  Lab Results  Component Value Date   TSH 2.77 09/23/2023   Lab Results  Component Value Date   WBC 5.5 06/09/2024   HGB 13.1 06/09/2024   HCT 38.4 06/09/2024   MCV 94.9 06/09/2024   PLT 233.0 06/09/2024   Lab Results  Component Value Date   NA 142 06/09/2024   K 4.4 06/09/2024   CO2 32 06/09/2024   GLUCOSE 92 06/09/2024   BUN 13 06/09/2024   CREATININE 0.97 06/09/2024   BILITOT 0.4 06/09/2024   ALKPHOS 58 06/09/2024   AST 23 06/09/2024   ALT 17 06/09/2024   PROT 6.7 06/09/2024   ALBUMIN 4.2 06/09/2024   CALCIUM  9.6 06/09/2024   ANIONGAP 7 12/11/2021   GFR 51.24 (L) 06/09/2024   Lab Results  Component Value Date   CHOL 168 09/23/2023   Lab Results  Component Value Date   HDL 32.00 (L) 09/23/2023   Lab Results  Component Value Date   LDLCALC 84 09/23/2023   Lab Results  Component Value Date   TRIG 258.0 (H) 09/23/2023   Lab Results  Component Value Date  CHOLHDL 5 09/23/2023   Lab Results  Component Value Date   HGBA1C 5.7 03/18/2023      Assessment & Plan:  Diarrhea, unspecified type Assessment & Plan: Diarrhea has improved but not stopped completely.  No nausea or vomiting. -Recommended bland diet, avoid spicy heavy fried food and dairy. -Encouraged hydration with electrolyte solution like Pedialyte and Gatorade. -Handout provided for supplemental information. - Will check labs as outlined. -She will let us  know if symptoms not improving.   Orders: -     Comprehensive metabolic panel with GFR -     CBC with Differential/Platelet -     GI pathogen panel by PCR, stool -     C Difficile Quick  Screen w PCR reflex; Future    Follow-up: Return if symptoms worsen or fail to improve.   Chihiro Frey, NP

## 2024-06-09 NOTE — Patient Instructions (Addendum)
 She likes diarrhea, Adult Diarrhea is when you pass loose and sometimes watery poop (stool) often. Diarrhea can make you feel weak and cause you to lose water  in your body (get dehydrated). Losing water  in your body can cause you to: Feel tired and thirsty. Have a dry mouth. Go pee (urinate) less often. Diarrhea often lasts 2-3 days. It can last longer if it is a sign of something more serious. Be sure to treat your diarrhea as told by your doctor. Follow these instructions at home: Eating and drinking     Follow these instructions as told by your doctor: Take an ORS (oral rehydration solution). This is a drink that helps you replace fluids and minerals your body lost. It is sold at pharmacies and stores. Drink enough fluid to keep your pee (urine) pale yellow. Drink fluids such as: Water . You can also get fluids by sucking on ice chips. Diluted fruit juice. Low-calorie sports drinks. Milk. Avoid drinking fluids that have a lot of sugar or caffeine in them. These include soda, energy drinks, and regular sports drinks. Avoid alcohol. Eat bland, easy-to-digest foods in small amounts as you are able. These foods include: Bananas. Applesauce. Rice. Low-fat (lean) meats. Toast. Crackers. Avoid spicy or fatty foods.  Medicines Take over-the-counter and prescription medicines only as told by your doctor. If you were prescribed antibiotics, take them as told by your doctor. Do not stop taking them even if you start to feel better. General instructions  Wash your hands often using soap and water  for 20 seconds. If soap and water  are not available, use hand sanitizer. Others in your home should wash their hands as well. Wash your hands: After using the toilet or changing a diaper. Before preparing, cooking, or serving food. While caring for a sick person. While visiting someone in a hospital. Rest at home while you get better. Take a warm bath to help with any burning or pain from  having diarrhea. Watch your condition for any changes. Contact a doctor if: You have a fever. Your diarrhea gets worse. You have new symptoms. You vomit every time you eat or drink. You feel light-headed, dizzy, or you have a headache. You have muscle cramps. You have signs of losing too much water  in your body, such as: Dark pee, very little pee, or no pee. Cracked lips. Dry mouth. Sunken eyes. Sleepiness. Weakness. You have bloody or black poop or poop that looks like tar. You have very bad pain, cramping, or bloating in your belly (abdomen). Your skin feels cold and clammy. You feel confused. Get help right away if: You have chest pain. Your heart is beating very quickly. You have trouble breathing or you are breathing very quickly. You feel very weak or you faint. These symptoms may be an emergency. Get help right away. Call 911. Do not wait to see if the symptoms will go away. Do not drive yourself to the hospital. This information is not intended to replace advice given to you by your health care provider. Make sure you discuss any questions you have with your health care provider. Document Revised: 04/29/2022 Document Reviewed: 04/29/2022 Elsevier Patient Education  2024 [employer].

## 2024-06-13 ENCOUNTER — Other Ambulatory Visit
Admission: RE | Admit: 2024-06-13 | Discharge: 2024-06-13 | Disposition: A | Discharge status: Home / Self Care | Source: Ambulatory Visit | Attending: Nurse Practitioner | Admitting: Nurse Practitioner

## 2024-06-13 ENCOUNTER — Encounter

## 2024-06-13 DIAGNOSIS — R350 Frequency of micturition: Secondary | ICD-10-CM | POA: Insufficient documentation

## 2024-06-13 DIAGNOSIS — R197 Diarrhea, unspecified: Secondary | ICD-10-CM | POA: Diagnosis not present

## 2024-06-13 LAB — C DIFFICILE QUICK SCREEN W PCR REFLEX
C Diff antigen: NEGATIVE
C Diff interpretation: NOT DETECTED
C Diff toxin: NEGATIVE

## 2024-06-13 LAB — GASTROINTESTINAL PANEL BY PCR, STOOL (REPLACES STOOL CULTURE)

## 2024-06-15 ENCOUNTER — Encounter

## 2024-06-17 ENCOUNTER — Ambulatory Visit: Payer: Self-pay | Admitting: Nurse Practitioner

## 2024-06-17 NOTE — Progress Notes (Signed)
 Please inform pt:  Stool culture normal.

## 2024-06-17 NOTE — Progress Notes (Signed)
 Labs stable

## 2024-06-20 ENCOUNTER — Encounter

## 2024-06-22 ENCOUNTER — Encounter

## 2024-06-23 DIAGNOSIS — R197 Diarrhea, unspecified: Secondary | ICD-10-CM | POA: Insufficient documentation

## 2024-06-23 NOTE — Assessment & Plan Note (Signed)
 Diarrhea has improved but not stopped completely.  No nausea or vomiting. -Recommended bland diet, avoid spicy heavy fried food and dairy. -Encouraged hydration with electrolyte solution like Pedialyte and Gatorade. -Handout provided for supplemental information. - Will check labs as outlined. -She will let us  know if symptoms not improving.

## 2024-08-22 ENCOUNTER — Other Ambulatory Visit: Payer: Self-pay | Admitting: Internal Medicine

## 2024-08-23 ENCOUNTER — Other Ambulatory Visit: Payer: Self-pay | Admitting: Internal Medicine

## 2024-08-24 ENCOUNTER — Ambulatory Visit: Payer: Self-pay

## 2024-08-24 ENCOUNTER — Ambulatory Visit (INDEPENDENT_AMBULATORY_CARE_PROVIDER_SITE_OTHER)

## 2024-08-24 DIAGNOSIS — L089 Local infection of the skin and subcutaneous tissue, unspecified: Secondary | ICD-10-CM | POA: Insufficient documentation

## 2024-08-24 MED ORDER — DOXYCYCLINE HYCLATE 100 MG PO TABS: 100.0000 mg | ORAL_TABLET | Freq: Two times a day (BID) | ORAL | 0 refills | Status: AC

## 2024-08-24 MED ORDER — FLUCONAZOLE 100 MG PO TABS: 100.0000 mg | ORAL_TABLET | Freq: Every day | ORAL | 0 refills | Status: AC

## 2024-08-24 MED ORDER — CHLORHEXIDINE GLUCONATE 2 % EX SOLN: CUTANEOUS | 1 refills | Status: AC

## 2024-08-24 NOTE — Progress Notes (Signed)
 Acute Office Visit  Subjective:    Patient ID: Emily Wu, female    DOB: [birth year], 89 y.o.   MRN: [id number]  Chief Complaint  Patient presents with   Nevus   Patient is in today for following acute concern: HPI Discussed the use of AI scribe software for clinical note transcription with the patient, who gave verbal consent to proceed.  History of Present Illness Emily Wu is a 89 year old female who presents with umbilical discharge and discomfort.   She noticed moisture and discomfort in her umbilical area about a week ago. Initially mild, the symptoms became more pronounced last night with moisture and blood observed.   She has been using a Q-tip to clean the area during showers this week, which may have contributed to the irritation. This morning, she observed what appeared to be blood, prompting her to seek medical attention.  No active bleeding, recent injuries, or cuts to the area. No history of hernia or stomach surgeries.  No fever, chills, changes in appetite, bowel movements, or vomiting. She is not on any blood thinners and has not been using any antibiotics recently.  The discharge has an unpleasant smell and the area feels irritated. She is not currently using any topical treatments like Bactroban  or mupirocin  ointment.   ROS As per HPI    Objective:    There were no vitals taken for this visit.   Physical Exam Constitutional:      General: She is not in acute distress. HENT:     Head: Normocephalic and atraumatic.     Mouth/Throat:     Mouth: Mucous membranes are moist.     Pharynx: Oropharynx is clear.  Cardiovascular:     Rate and Rhythm: Normal rate.  Pulmonary:     Effort: Pulmonary effort is normal.     Breath sounds: Normal breath sounds.  Abdominal:     Palpations: Abdomen is soft.     Tenderness: There is no guarding or rebound.     Comments: Umbilical: Erythematous lesion at umbilicus with irregular borders. Area appears  slightly macerated with malodorous discharge. No obvious fluctuance or purulent collection within the umbilicus. This is slightly tender on palpation and there is no evidence of crepitus, subcutaneous emphysema on exam. No abdominal masses or herniation on abdominal exam.  Musculoskeletal:     Right lower leg: No edema.     Left lower leg: No edema.  Skin:    Findings: Rash present.     Comments: Umbilical: Erythematous lesion at umbilicus with irregular borders. Area appears slightly macerated with malodorous discharge. No obvious fluctuance or purulent collection within the umbilicus. This is slightly tender on palpation and there is no evidence of crepitus, subcutaneous emphysema on exam. No abdominal masses or herniation on abdominal exam.  Neurological:     Mental Status: She is alert and oriented to person, place, and time.  Psychiatric:        Mood and Affect: Mood normal.       No results found for any visits on 08/24/24.     Assessment & Plan:   Skin infection Assessment & Plan: Umbilical skin infection with moisture, discharge. D/D omphalitis, candida/bacterial infection, umbilical nodule (Sister Ronal Pac nodule), complicated umbilical hernia. No known h/o diabetes, not on immunosuppressant or anticoagulation therapy.  Prescribed doxycycline  100 mg BID for 10 days for potential MRSA, MSSA. Take with water , remain upright for 30 minutes. Prescribed fluconazole 100 mg daily for 7 days  for candida coverage.  Prescribed chlorhexidine  solution for cleansing twice daily. Seek OTC if not covered. Instructed to avoid Q-tips and OTC medications on the area. Advised wearing loose clothing to reduce pressure. Instructed to monitor for systemic infection signs and seek emergency care if present like worsening redness, discharge, fever, chills, abdominal pain.   Orders: -     Chlorhexidine  Gluconate; 2% Chlorhexidine , twice a day to cleanse umbilical area  Dispense: 250 mL; Refill:  1 -     Doxycycline  Hyclate; Take 1 tablet (100 mg total) by mouth 2 (two) times daily for 10 days.  Dispense: 20 tablet; Refill: 0 -     Fluconazole; Take 1 tablet (100 mg total) by mouth daily for 7 days.  Dispense: 7 tablet; Refill: 0    Return if symptoms worsen or fail to improve.  Emily Shade, MD

## 2024-08-24 NOTE — Assessment & Plan Note (Signed)
 Umbilical skin infection with moisture, discharge. D/D omphalitis, candida/bacterial infection, umbilical nodule (Sister Ronal Pac nodule), complicated umbilical hernia. No known h/o diabetes, not on immunosuppressant or anticoagulation therapy.  Prescribed doxycycline  100 mg BID for 10 days for potential MRSA, MSSA. Take with water , remain upright for 30 minutes. Prescribed fluconazole 100 mg daily for 7 days for candida coverage.  Prescribed chlorhexidine  solution for cleansing twice daily. Seek OTC if not covered. Instructed to avoid Q-tips and OTC medications on the area. Advised wearing loose clothing to reduce pressure. Instructed to monitor for systemic infection signs and seek emergency care if present like worsening redness, discharge, fever, chills, abdominal pain.

## 2024-08-24 NOTE — Patient Instructions (Addendum)
 Take Doxycycline  100 mg, twice a day for 10 days. Take this with a tall glass of water  and do not lay down for 30 minutes after taking the medication.   Start taking antifungal medication called Fluconazole, 100 mg daily for the next 7 days.   If you develop fever, chills, abdominal pain, worsening redness, increased in discharge despite starting above 2 medications you should be seen in the ER as you may need IV antibiotics.   Gently clean the area around your belly button twice a day with a mild antiseptic solution called chlorhexidine . After cleaning, gently pat the area dry. Try to keep the area clean and dry throughout the day.

## 2024-08-24 NOTE — Telephone Encounter (Signed)
 FYI Only or Action Required?: FYI only for provider.  Patient was last seen in primary care on 06/09/2024 by Vincente Saber, NP.  Called Nurse Triage reporting Skin Problem.  Symptoms began a week ago.  Interventions attempted: Other: cleansing.  Symptoms are: gradually worsening.  Triage Disposition: See Physician Within 24 Hours  Patient/caregiver understands and will follow disposition?: Yes    Copied from CRM #[id number]. Topic: Clinical - Red Word Triage >> Aug 24, 2024  8:56 AM Emily Wu wrote: Red Word that prompted transfer to Nurse Triage: Patient states she is having a problem with her belly button. States this morning it is bleeding and burning/hurting.  This has been ongoing for about a week-but she has kept it clean and when she woke up this morning, she noticed it was bleeding. Reason for Disposition  [1] Looks infected (e.g., spreading redness, pus) AND [2] no fever  Answer Assessment - Initial Assessment Questions 1. APPEARANCE What does the injury look like?      Red, puffy, bloody drainage 2. ONSET: How long ago did the injury occur?      Few weeks ago irritation started, bloody drainage started overnight 08/23/24 3. LOCATION: Where is the injury located?      umbilicus 4. SIZE: How large is the cut?      Entire umbilicus  5. BLEEDING: Is it bleeding now? If Yes, ask: Is it difficult to stop?      Drainage with blood  6. PAIN: Is there any pain? If Yes, ask: How bad is the pain? (Scale 0-10; or none, mild, moderate, severe)     Burning  7. MECHANISM: Tell me how it happened.      Unsure, after each shower she dries umblicus with q tip  Protocols used: Skin Injury-A-AH

## 2024-08-26 ENCOUNTER — Ambulatory Visit (INDEPENDENT_AMBULATORY_CARE_PROVIDER_SITE_OTHER): Admitting: Nurse Practitioner

## 2024-08-26 ENCOUNTER — Ambulatory Visit: Payer: Self-pay

## 2024-08-26 ENCOUNTER — Encounter: Payer: Self-pay | Admitting: Nurse Practitioner

## 2024-08-26 VITALS — BP 116/76 | HR 61 | Temp 97.3°F | Ht 62.0 in | Wt 124.2 lb

## 2024-08-26 DIAGNOSIS — L089 Local infection of the skin and subcutaneous tissue, unspecified: Secondary | ICD-10-CM

## 2024-08-26 MED ORDER — CEPHALEXIN 500 MG PO CAPS: 500.0000 mg | ORAL_CAPSULE | Freq: Two times a day (BID) | ORAL | 0 refills | Status: DC

## 2024-08-26 NOTE — Progress Notes (Signed)
 Established Patient Office Visit  Subjective:  Patient ID: KANYON BUNN, female    DOB: Jun 07, 1931  Age: 89 y.o. MRN: [id number]  CC:  Chief Complaint  Patient presents with   Acute Visit    Infection in Belly Button   Discussed the use of a AI scribe software for clinical note transcription with the patient, who gave verbal consent to proceed.  HPI SHERRILL BUIKEMA is a 89 year old female who presents with a persistent umbilical infection.  She experiences a persistent infection in the umbilical region, with tenderness and concern about potential internal spread. She discontinued doxycycline  due to nausea and vomiting, which occurred twice yesterday but not today. She remains bedridden and uses chlorhexidine  for cleansing. No fever is present. She lives alone but receives daytime visits. Bowel movements are normal. HPI   Past Medical History:  Diagnosis Date   Arthritis    knees, shoulders, chest (02/03/2014)   Bruises easily    Cervical cancer [facility]) 1965   Concussion 03/11/2020   High cholesterol    History of kidney stones    Hypertension    DENIES    Tendency toward bleeding easily     Past Surgical History:  Procedure Laterality Date   ABDOMINAL HYSTERECTOMY  1965   APPENDECTOMY     CHOLECYSTECTOMY     KNEE ARTHROSCOPY Left    [facility]   PARTIAL KNEE ARTHROPLASTY Left 01/26/2019   Procedure: UNICOMPARTMENTAL KNEE;  Surgeon: Josefina Chew, MD;  Location: [facility] OR;  Service: Orthopedics;  Laterality: Left;   PARTIAL KNEE ARTHROPLASTY Right 12/10/2021   Procedure: UNICOMPARTMENTAL KNEE;  Surgeon: Josefina Chew, MD;  Location: [facility] ORS;  Service: Orthopedics;  Laterality: Right;   SHOULDER OPEN ROTATOR CUFF REPAIR Bilateral 2007,  2010   [facility]   THROAT SURGERY  2014   arthritis [place] knot (02/03/2014)    Family History  Problem Relation Age of Onset   Peripheral vascular disease Mother    Stroke Father    Hyperlipidemia Father    Heart disease Father 25   Cancer  Neg Hx     Social History   Socioeconomic History   Marital status: Married    Spouse name: Not on file   Number of children: Not on file   Years of education: Not on file   Highest education level: Not on file  Occupational History   Not on file  Tobacco Use   Smoking status: Former    Current packs/day: 0.00    Average packs/day: 2.0 packs/day for 7.0 years (14.0 ttl pk-yrs)    Types: Cigarettes    Start date: 62    Quit date: 78    Years since quitting: 65.8   Smokeless tobacco: Never   Tobacco comments:    02/03/2014 quit smoking in the 1960's  Vaping Use   Vaping status: Never Used  Substance and Sexual Activity   Alcohol use: No   Drug use: No   Sexual activity: Never  Other Topics Concern   Not on file  Social History Narrative   Widowed   Has a caregiver 6 days a week   Social Drivers of Corporate investment banker Strain: Low Risk  (06/26/2023)   Overall Financial Resource Strain (CARDIA)    Difficulty of Paying Living Expenses: Not hard at all  Food Insecurity: No Food Insecurity (06/26/2023)   Hunger Vital Sign    Worried About Running Out of Food in the Last Year: Never true  Ran Out of Food in the Last Year: Never true  Transportation Needs: No Transportation Needs (06/26/2023)   PRAPARE - Administrator, Civil Service (Medical): No    Lack of Transportation (Non-Medical): No  Physical Activity: Inactive (06/26/2023)   Exercise Vital Sign    Days of Exercise per Week: 0 days    Minutes of Exercise per Session: 0 min  Stress: No Stress Concern Present (06/26/2023)   [employer] of Occupational Health - Occupational Stress Questionnaire    Feeling of Stress : Only a little  Social Connections: Socially Isolated (06/26/2023)   Social Connection and Isolation Panel    Frequency of Communication with Friends and Family: More than three times a week    Frequency of Social Gatherings with Friends and Family: More than three times a week     Attends Religious Services: Never    Database administrator or Organizations: No    Attends Banker Meetings: Never    Marital Status: Widowed  Intimate Partner Violence: Not At Risk (06/26/2023)   Humiliation, Afraid, Rape, and Kick questionnaire    Fear of Current or Ex-Partner: No    Emotionally Abused: No    Physically Abused: No    Sexually Abused: No     Outpatient Medications Prior to Visit  Medication Sig Dispense Refill   ACETAMINOPHEN  ER PO Take 500 mg by mouth daily.     Chlorhexidine  Gluconate 2 % SOLN 2% Chlorhexidine , twice a day to cleanse umbilical area 250 mL 1   diphenoxylate-atropine (LOMOTIL) 2.5-0.025 MG tablet Take 1 tablet each morning with breakfast.  You may take an additional tablet during the day with each loose stool for a maximum of 4 tablets per day.     docusate sodium  (COLACE) 100 MG capsule Take 1 capsule (100 mg total) by mouth 2 (two) times daily. To prevent constipation while taking pain medication. 30 capsule 0   donepezil  (ARICEPT ) 10 MG tablet Take 10 mg by mouth at bedtime.     doxycycline  (VIBRA -TABS) 100 MG tablet Take 1 tablet (100 mg total) by mouth 2 (two) times daily for 10 days. 20 tablet 0   famotidine  (PEPCID  AC) 10 MG tablet 1 TAB(S) ORALLY 2 TIMES A DAY; Duration: 30 DAY(S)     fluconazole (DIFLUCAN) 100 MG tablet Take 1 tablet (100 mg total) by mouth daily for 7 days. 7 tablet 0   ipratropium (ATROVENT ) 0.06 % nasal spray USE TWO SPRAYS INTO BOTH NOSTRILS FOUR TIMES DAILY FOR RUNNY NOSE 15 mL 2   ketorolac  (ACULAR ) 0.5 % ophthalmic solution SMARTSIG:In Eye(s)     liothyronine  (CYTOMEL ) 5 MCG tablet TAKE 1 TABLET BY MOUTH ONCE DAILY AS DIRECTED. 90 tablet 0   mirtazapine  (REMERON ) 30 MG tablet TAKE 1 TABLET BY MOUTH AT BEDTIME AS DIRECTED. 90 tablet 0   moxifloxacin (VIGAMOX) 0.5 % ophthalmic solution Apply to eye.     mupirocin  ointment (BACTROBAN ) 2 % Apply 1 Application topically 2 (two) times daily. 22 g 0    nystatin -triamcinolone  ointment (MYCOLOG) Apply 1 Application topically 2 (two) times daily. 30 g 0   prednisoLONE acetate (PRED FORTE) 1 % ophthalmic suspension SMARTSIG:In Eye(s)     rosuvastatin  (CRESTOR ) 10 MG tablet TAKE 1 TABLET BY MOUTH DAILY 90 tablet 3   tiZANidine (ZANAFLEX) 4 MG tablet Take 4 mg by mouth every 6 (six) hours as needed.     vitamin B-12 (CYANOCOBALAMIN ) 1000 MCG tablet Take 1,000 mcg by mouth daily.  No facility-administered medications prior to visit.    Allergies  Allergen Reactions   Tramadol  Anaphylaxis and Other (See Comments)    Lowered Blood Pressure   Amoxicillin Rash   Promethazine Hcl Rash   Sulfa Drugs Cross Reactors Rash    ROS Review of Systems Negative unless indicated in HPI.    Objective:    Physical Exam  BP 116/76   Pulse 61   Temp (!) 97.3 F (36.3 C)   Ht 5' 2 (1.575 m)   Wt 124 lb 3.2 oz (56.3 kg)   SpO2 97%   BMI 22.72 kg/m  Wt Readings from Last 3 Encounters:  08/26/24 124 lb 3.2 oz (56.3 kg)  06/09/24 124 lb 6 oz (56.4 kg)  04/04/24 123 lb 6.4 oz (56 kg)     Health Maintenance  Topic Date Due   DTaP/Tdap/Td (2 - Td or Tdap) 11/08/2023   Medicare Annual Wellness (AWV)  06/25/2024   Zoster Vaccines- Shingrix (2 of 2) 09/09/2024 (Originally 09/29/2018)   Influenza Vaccine  02/21/2025 (Originally 06/24/2024)   Pneumococcal Vaccine: 50+ Years  Completed   DEXA SCAN  Completed   Meningococcal B Vaccine  Aged Out   Mammogram  Discontinued   COVID-19 Vaccine  Discontinued    There are no preventive care reminders to display for this patient.  Lab Results  Component Value Date   TSH 2.77 09/23/2023   Lab Results  Component Value Date   WBC 5.5 06/09/2024   HGB 13.1 06/09/2024   HCT 38.4 06/09/2024   MCV 94.9 06/09/2024   PLT 233.0 06/09/2024   Lab Results  Component Value Date   NA 142 06/09/2024   K 4.4 06/09/2024   CO2 32 06/09/2024   GLUCOSE 92 06/09/2024   BUN 13 06/09/2024   CREATININE 0.97  06/09/2024   BILITOT 0.4 06/09/2024   ALKPHOS 58 06/09/2024   AST 23 06/09/2024   ALT 17 06/09/2024   PROT 6.7 06/09/2024   ALBUMIN 4.2 06/09/2024   CALCIUM  9.6 06/09/2024   ANIONGAP 7 12/11/2021   GFR 51.24 (L) 06/09/2024   Lab Results  Component Value Date   CHOL 168 09/23/2023   Lab Results  Component Value Date   HDL 32.00 (L) 09/23/2023   Lab Results  Component Value Date   LDLCALC 84 09/23/2023   Lab Results  Component Value Date   TRIG 258.0 (H) 09/23/2023   Lab Results  Component Value Date   CHOLHDL 5 09/23/2023   Lab Results  Component Value Date   HGBA1C 5.7 03/18/2023      Assessment & Plan:  Skin infection Assessment & Plan: Persistent infection with concerns about spread. Current antibiotics causing nausea, limiting adherence. - Change to Keflex  (cephalexin ) and monitor tolerance. - Educated on signs of worsening infection and advised immediate medical attention if she occurs. - Advised hospital care for IV antibiotics if oral antibiotics are intolerable.   Other orders -     Cephalexin ; Take 1 capsule (500 mg total) by mouth 2 (two) times daily.  Dispense: 20 capsule; Refill: 0    Follow-up: Return if symptoms worsen or fail to improve.   Khiree Bukhari, NP

## 2024-08-26 NOTE — Telephone Encounter (Signed)
 FYI Only or Action Required?: FYI only for provider.  Patient was last seen in primary care on 08/24/2024 by Abbey Bruckner, MD.  Called Nurse Triage reporting Wound Infection. Also vomiting following taking ABX  Symptoms began several days ago.  Interventions attempted: Other: Seen in office. Rxs prescribed.  Symptoms are: gradually worsening.  Triage Disposition: No disposition on file.  Patient/caregiver understands and will follow disposition?: yes                  Copied from CRM [id number]. Topic: Clinical - Red Word Triage >> Aug 26, 2024  8:38 AM Pinkey ORN wrote: Red Word that prompted transfer to Nurse Triage: Worsening Irritation >> Aug 26, 2024  8:42 AM [facility] ORN wrote: Patient was last seen in office Oct 1 for bloody + discharge from the belly button. Patient states she's needing to come back in and have it looked at again because it isn't getting any better. Patient states that it's angry and the irritation is worsening.  Answer Assessment - Initial Assessment Questions 1. SYMPTOM: What's the main symptom you're concerned about? (e.g., redness, swelling, pain, fever, weakness)     Redness, angry and irritated 2. WOUND INFECTION LOCATION: Where is the wound infection located? (e.g., arm, face, foot, knee, leg)     Belly button 3. WOUND INFECTION SIZE: What is the size of the red area? (e.g., inches, centimeters; compare to size of a coin)      Did not ask 4. BETTER-SAME-WORSE: Are you getting better, staying the same, or getting worse compared to the day you started the antibiotics?      worse 5. PAIN: Do you have any pain?  If Yes, ask: How bad is the pain?  (e.g., Scale 1-10; mild, moderate, or severe)     Did not ask 6. FEVER: Do you have a fever? If Yes, ask: What is it, how was it measured and when did it start?     no 7. OTHER SYMPTOMS: Do you have any other symptoms? (e.g., pus coming from a wound, red streaks, weakness)      Nausea and vomiting after taking abx 8. DIAGNOSIS DATE: When was the wound infection diagnosed? By whom?      10/1 9. ANTIBIOTIC NAME: What antibiotic(s) are you taking?  How many times a day? Note: Be sure the patient is taking the antibiotic as directed.      Doxycycline  10. ANTIBIOTIC DATE: When was the antibiotic started?       10/2 11. FOLLOW-UP APPOINTMENT: Do you have a follow-up appointment with your doctor?  Protocols used: Wound Infection on Antibiotic Follow-up Call-[facility]

## 2024-08-26 NOTE — Patient Instructions (Addendum)
 We are changing your antibiotic to Keflex  (cephalexin ) and stop Doxycycline . Continue to take Fluconazole. Take medication with food and take probiotic to reduce GI side effect.

## 2024-08-29 ENCOUNTER — Encounter: Payer: Self-pay | Admitting: Nurse Practitioner

## 2024-08-29 ENCOUNTER — Telehealth: Payer: Self-pay | Admitting: Nurse Practitioner

## 2024-08-29 NOTE — Telephone Encounter (Signed)
 Patient states her belly is better and the antibiotic that Charanpreet Vincente called in did not make her nauseated.

## 2024-08-29 NOTE — Assessment & Plan Note (Signed)
 Persistent infection with concerns about spread. Current antibiotics causing nausea, limiting adherence. - Change to Keflex  (cephalexin ) and monitor tolerance. - Educated on signs of worsening infection and advised immediate medical attention if she occurs. - Advised hospital care for IV antibiotics if oral antibiotics are intolerable.

## 2024-08-31 NOTE — Telephone Encounter (Signed)
 Noted! Thank you

## 2024-09-08 ENCOUNTER — Ambulatory Visit: Admitting: Internal Medicine

## 2024-09-08 ENCOUNTER — Encounter: Payer: Self-pay | Admitting: Internal Medicine

## 2024-09-08 VITALS — BP 132/66 | HR 79 | Ht 62.0 in | Wt 125.0 lb

## 2024-09-08 DIAGNOSIS — K8681 Exocrine pancreatic insufficiency: Secondary | ICD-10-CM

## 2024-09-08 DIAGNOSIS — D649 Anemia, unspecified: Secondary | ICD-10-CM | POA: Diagnosis not present

## 2024-09-08 DIAGNOSIS — Z7189 Other specified counseling: Secondary | ICD-10-CM

## 2024-09-08 DIAGNOSIS — R7301 Impaired fasting glucose: Secondary | ICD-10-CM | POA: Diagnosis not present

## 2024-09-08 DIAGNOSIS — Z0000 Encounter for general adult medical examination without abnormal findings: Secondary | ICD-10-CM | POA: Diagnosis not present

## 2024-09-08 DIAGNOSIS — L03316 Cellulitis of umbilicus: Secondary | ICD-10-CM

## 2024-09-08 DIAGNOSIS — M40209 Unspecified kyphosis, site unspecified: Secondary | ICD-10-CM | POA: Diagnosis not present

## 2024-09-08 DIAGNOSIS — E7800 Pure hypercholesterolemia, unspecified: Secondary | ICD-10-CM | POA: Diagnosis not present

## 2024-09-08 DIAGNOSIS — M6281 Muscle weakness (generalized): Secondary | ICD-10-CM

## 2024-09-08 DIAGNOSIS — E034 Atrophy of thyroid (acquired): Secondary | ICD-10-CM

## 2024-09-08 DIAGNOSIS — N1831 Chronic kidney disease, stage 3a: Secondary | ICD-10-CM

## 2024-09-08 LAB — COMPREHENSIVE METABOLIC PANEL WITH GFR
ALT: 14 U/L (ref 0–35)
AST: 19 U/L (ref 0–37)
Albumin: 4.2 g/dL (ref 3.5–5.2)
Alkaline Phosphatase: 68 U/L (ref 39–117)
BUN: 13 mg/dL (ref 6–23)
CO2: 30 meq/L (ref 19–32)
Calcium: 9.4 mg/dL (ref 8.4–10.5)
Chloride: 103 meq/L (ref 96–112)
Creatinine, Ser: 1.07 mg/dL (ref 0.40–1.20)
GFR: 45.47 mL/min — ABNORMAL LOW (ref >60.00)
Glucose, Bld: 77 mg/dL (ref 70–99)
Potassium: 4.5 meq/L (ref 3.5–5.1)
Sodium: 141 meq/L (ref 135–145)
Total Bilirubin: 0.4 mg/dL (ref 0.2–1.2)
Total Protein: 6.4 g/dL (ref 6.0–8.3)

## 2024-09-08 LAB — CBC WITH DIFFERENTIAL/PLATELET
Basophils Absolute: 0 K/uL (ref 0.0–0.1)
Basophils Relative: 0.5 % (ref 0.0–3.0)
Eosinophils Absolute: 0.1 K/uL (ref 0.0–0.7)
Eosinophils Relative: 1.8 % (ref 0.0–5.0)
HCT: 40.9 % (ref 36.0–46.0)
Hemoglobin: 13.6 g/dL (ref 12.0–15.0)
Lymphocytes Relative: 42.5 % (ref 12.0–46.0)
Lymphs Abs: 2.5 K/uL (ref 0.7–4.0)
MCHC: 33.3 g/dL (ref 30.0–36.0)
MCV: 95.3 fl (ref 78.0–100.0)
Monocytes Absolute: 0.6 K/uL (ref 0.1–1.0)
Monocytes Relative: 9.4 % (ref 3.0–12.0)
Neutro Abs: 2.7 K/uL (ref 1.4–7.7)
Neutrophils Relative %: 45.8 % (ref 43.0–77.0)
Platelets: 237 K/uL (ref 150.0–400.0)
RBC: 4.3 Mil/uL (ref 3.87–5.11)
RDW: 13.2 % (ref 11.5–15.5)
WBC: 6 K/uL (ref 4.0–10.5)

## 2024-09-08 LAB — LIPID PANEL
Cholesterol: 179 mg/dL (ref 0–200)
HDL: 33.8 mg/dL — ABNORMAL LOW (ref >39.00)
LDL Cholesterol: 87 mg/dL (ref 0–99)
NonHDL: 145.32
Total CHOL/HDL Ratio: 5
Triglycerides: 293 mg/dL — ABNORMAL HIGH (ref 0.0–149.0)
VLDL: 58.6 mg/dL — ABNORMAL HIGH (ref 0.0–40.0)

## 2024-09-08 LAB — TSH: TSH: 3.11 u[IU]/mL (ref 0.35–5.50)

## 2024-09-08 LAB — HEMOGLOBIN A1C: Hgb A1c MFr Bld: 5.8 % (ref 4.6–6.5)

## 2024-09-08 LAB — LDL CHOLESTEROL, DIRECT: Direct LDL: 90 mg/dL

## 2024-09-08 MED ORDER — LIOTHYRONINE SODIUM 5 MCG PO TABS: 5.0000 ug | ORAL_TABLET | Freq: Every day | ORAL | 1 refills | Status: AC

## 2024-09-08 MED ORDER — NYSTATIN 100000 UNIT/GM EX CREA: 1.0000 | TOPICAL_CREAM | Freq: Two times a day (BID) | CUTANEOUS | 0 refills | Status: AC

## 2024-09-08 MED ORDER — TETANUS-DIPHTH-ACELL PERTUSSIS 5-2-15.5 LF-MCG/0.5 IM SUSP: 0.5000 mL | Freq: Once | INTRAMUSCULAR | 0 refills | Status: AC

## 2024-09-08 MED ORDER — MIRTAZAPINE 30 MG PO TABS: ORAL_TABLET | ORAL | 1 refills | Status: DC

## 2024-09-08 NOTE — Progress Notes (Signed)
 Patient ID: Emily Wu, female    DOB: 01/05/1931  Age: 89 y.o. MRN: [id number]  The patient is here for annual preventive examination and management of other chronic and acute problems.   The risk factors are reflected in the social history.   The roster of all physicians providing medical care to patient - is listed in the Snapshot section of the chart.   Activities of daily living:  The patient is 100% independent in all ADLs: dressing, toileting, feeding as well as independent mobility   Home safety : The patient has smoke detectors in the home. They wear seatbelts.  There are no unsecured firearms at home. There is no violence in the home.    There is no risks for hepatitis, STDs or HIV. There is no   history of blood transfusion. They have no travel history to infectious disease endemic areas of the world.   The patient has seen their dentist in the last six month. They have seen their eye doctor in the last year. The patinet  denies slight hearing difficulty with regard to whispered voices and some television programs.  They have deferred audiologic testing in the last year.  They do not  have excessive sun exposure. Discussed the need for sun protection: hats, long sleeves and use of sunscreen if there is significant sun exposure.    Diet: the importance of a healthy diet is discussed. They do have a healthy diet.   The benefits of regular aerobic exercise were discussed. The patient  walks inside her home  5 days per week  for  10 minutes.    Depression screen: there are no signs or vegative symptoms of depression- irritability, change in appetite, anhedonia, sadness/tearfullness.   The following portions of the patient's history were reviewed and updated as appropriate: allergies, current medications, past family history, past medical history,  past surgical history, past social history  and problem list.   Visual acuity was not assessed per patient preference since the patient  has regular follow up with an  ophthalmologist. Hearing and body mass index were assessed and reviewed.    During the course of the visit the patient was educated and counseled about appropriate screening and preventive services including : fall prevention , diabetes screening, nutrition counseling, colorectal cancer screening, and recommended immunizations.    Chief Complaint:  Treated for cellulitis of umbilicus with cephalexin  on Oct 3 still using chlorhexinate   all resolved   Diarrhea from July resolved unless she eats something fatty   Stress incontinence withcough; wears a pad  Cataract surgery on both eyes done in the past year by [facility] in [place]  Living will in place  . Reviewed DNR order which she states she has.   She is sleeping well , eating well      Review of Symptoms  Patient denies headache, fevers, malaise, unintentional weight loss, skin rash, eye pain, sinus congestion and sinus pain, sore throat, dysphagia,  hemoptysis , cough, dyspnea, wheezing, chest pain, palpitations, orthopnea, edema, abdominal pain, nausea, melena, diarrhea, constipation, flank pain, dysuria, hematuria, urinary  Frequency, nocturia, numbness, tingling, seizures,  Focal weakness, Loss of consciousness,  Tremor, insomnia, depression, anxiety, and suicidal ideation.    Physical Exam:  BP 132/66   Pulse 79   Ht 5' 2 (1.575 m)   Wt 125 lb (56.7 kg)   SpO2 98%   BMI 22.86 kg/m    Physical Exam Vitals reviewed.  Constitutional:  General: She is not in acute distress.    Appearance: Normal appearance. She is well-developed and normal weight. She is not ill-appearing, toxic-appearing or diaphoretic.  HENT:     Head: Normocephalic.     Right Ear: Tympanic membrane, ear canal and external ear normal. There is no impacted cerumen.     Left Ear: Tympanic membrane, ear canal and external ear normal. There is no impacted cerumen.     Nose: Nose normal.     Mouth/Throat:     Mouth: Mucous  membranes are moist.     Pharynx: Oropharynx is clear.  Eyes:     General: No scleral icterus.       Right eye: No discharge.        Left eye: No discharge.     Conjunctiva/sclera: Conjunctivae normal.     Pupils: Pupils are equal, round, and reactive to light.  Neck:     Thyroid : No thyromegaly.     Vascular: No carotid bruit or JVD.  Cardiovascular:     Rate and Rhythm: Normal rate and regular rhythm.     Heart sounds: Normal heart sounds.  Pulmonary:     Effort: Pulmonary effort is normal. No respiratory distress.     Breath sounds: Normal breath sounds.  Chest:  Breasts:    Breasts are symmetrical.     Right: Normal. No swelling, inverted nipple, mass, nipple discharge, skin change or tenderness.     Left: Normal. No swelling, inverted nipple, mass, nipple discharge, skin change or tenderness.  Abdominal:     General: Bowel sounds are normal.     Palpations: Abdomen is soft. There is no mass.     Tenderness: There is no abdominal tenderness. There is no guarding or rebound.  Musculoskeletal:        General: Normal range of motion.     Cervical back: Normal range of motion and neck supple.  Lymphadenopathy:     Cervical: No cervical adenopathy.     Upper Body:     Right upper body: No supraclavicular, axillary or pectoral adenopathy.     Left upper body: No supraclavicular, axillary or pectoral adenopathy.  Skin:    General: Skin is warm and dry.  Neurological:     General: No focal deficit present.     Mental Status: She is alert and oriented to person, place, and time. Mental status is at baseline.  Psychiatric:        Mood and Affect: Mood normal.        Behavior: Behavior normal.        Thought Content: Thought content normal.        Judgment: Judgment normal.     Assessment and Plan: Hypothyroidism due to acquired atrophy of thyroid  -     TSH  Pure hypercholesterolemia -     Lipid panel -     LDL cholesterol, direct  Anemia, unspecified type -     CBC  with Differential/Platelet  Impaired fasting glucose -     Comprehensive metabolic panel with GFR -     Hemoglobin A1c  DNR (do not resuscitate) discussion Assessment & Plan: DNR status has been requested by patient after risks and benefits of the order were discussed today .  Order given to patient and placed in chart.   Orders: -     Do not attempt resuscitation (DNR)  Acquired kyphosis Assessment & Plan: She has severe /advanced thoracolumbar kyphosis and degenerative changes . She is using a  4 footed walker in the house and a cane outside of the house.    Cellulitis, umbilical Assessment & Plan: Resolved on exam.  Advised to stop using the antibacterial solution.  Rx for nystatin   given for prn use    Stage 3a chronic kidney disease (HCC) Assessment & Plan: There has been no progression .  GFR is stable but < 50 ml/min  Lab Results  Component Value Date   CREATININE 1.07 09/08/2024   Lab Results  Component Value Date   NA 141 09/08/2024   K 4.5 09/08/2024   CL 103 09/08/2024   CO2 30 09/08/2024      Exocrine pancreatic insufficiency Assessment & Plan: Confirmed with low  Pancreatic elastase level in September  2022,   Resulting  in chronic diarrhea and malabsorption/.  History of medication trials:  Could not afford Creon,  was switched to Colestid .  Diarrhea had resolved with use of Colestid  1 but then returned .  She is now seeing a [place] [facility] clinic and supposed to be taking Lomotil 1-2 times daily  per review of Nov 29 office note   Generalized muscle weakness Assessment & Plan: Improving  strength and mobility with PT aand improved nutrition.     Encounter for preventive health examination Assessment & Plan: age appropriate education and counseling updated, referrals for preventative services and immunizations addressed, dietary and smoking counseling addressed, most recent labs reviewed.  I have personally reviewed and have noted:   1) the patient's  medical and social history 2) The pt's use of alcohol, tobacco, and illicit drugs 3) The patient's current medications and supplements 4) Functional ability including ADL's, fall risk, home safety risk, hearing and visual impairment 5) Diet and physical activities 6) Evidence for depression or mood disorder 7) The patient's height, weight, and BMI have been recorded in the chart 8) Living will and DNR orders reviewed    I have made referrals, and provided counseling and education based on review of the above    Other orders -     Liothyronine  Sodium; Take 1 tablet (5 mcg total) by mouth daily. as directed  Dispense: 90 tablet; Refill: 1 -     Mirtazapine ; TAKE 1 TABLET BY MOUTH AT BEDTIME AS DIRECTED.  Dispense: 90 tablet; Refill: 1 -     Nystatin ; Apply 1 Application topically 2 (two) times daily. A s needed for skin irritation in umbilicus  Dispense: 30 g; Refill: 0 -     Tetanus-Diphth-Acell Pertussis; Inject 0.5 mLs into the muscle once for 1 dose.  Dispense: 0.5 mL; Refill: 0    No follow-ups on file.  Verneita LITTIE Kettering, MD

## 2024-09-08 NOTE — Assessment & Plan Note (Signed)
DNR status has been requested by patient after risks and benefits of the order were discussed today .  Order given to patient and placed in chart. ?

## 2024-09-08 NOTE — Patient Instructions (Addendum)
 I have prescribed a different cream to use if your bellybutton rash returns.   You can stop using the other one for now.  I have given you an extra copy of the DO NOT RESUSCITATE  ORDER  to keep in your car or at your daughter's house if you stay with her a lot    You are OVERDUE for your tetanus-diptheria-pertussis vaccine   (TDaP)   Please get this done at [facility];  it will be PAID FOR MY MEDICARE ONLY AT [facility]

## 2024-09-09 ENCOUNTER — Encounter: Payer: Self-pay | Admitting: Internal Medicine

## 2024-09-09 ENCOUNTER — Ambulatory Visit: Payer: Self-pay | Admitting: Internal Medicine

## 2024-09-09 NOTE — Assessment & Plan Note (Signed)
 Confirmed with low  Pancreatic elastase level in September  2022,   Resulting  in chronic diarrhea and malabsorption/.  History of medication trials:  Could not afford Creon,  was switched to Colestid .  Diarrhea had resolved with use of Colestid  1 but then returned .  She is now seeing a [place] [facility] clinic and supposed to be taking Lomotil 1-2 times daily  per review of Nov 29 office note

## 2024-09-09 NOTE — Assessment & Plan Note (Signed)
 age appropriate education and counseling updated, referrals for preventative services and immunizations addressed, dietary and smoking counseling addressed, most recent labs reviewed.  I have personally reviewed and have noted:   1) the patient's medical and social history 2) The pt's use of alcohol, tobacco, and illicit drugs 3) The patient's current medications and supplements 4) Functional ability including ADL's, fall risk, home safety risk, hearing and visual impairment 5) Diet and physical activities 6) Evidence for depression or mood disorder 7) The patient's height, weight, and BMI have been recorded in the chart 8) Living will and DNR orders reviewed    I have made referrals, and provided counseling and education based on review of the above

## 2024-09-09 NOTE — Assessment & Plan Note (Signed)
 There has been no progression .  GFR is stable but < 50 ml/min  Lab Results  Component Value Date   CREATININE 1.07 09/08/2024   Lab Results  Component Value Date   NA 141 09/08/2024   K 4.5 09/08/2024   CL 103 09/08/2024   CO2 30 09/08/2024

## 2024-09-09 NOTE — Assessment & Plan Note (Signed)
 Improving  strength and mobility with PT aand improved nutrition.

## 2024-09-09 NOTE — Assessment & Plan Note (Signed)
She has severe /advanced thoracolumbar kyphosis and degenerative changes . She is using a 4 footed walker in the house and a cane outside of the house.

## 2024-09-09 NOTE — Assessment & Plan Note (Signed)
 Resolved on exam.  Advised to stop using the antibacterial solution.  Rx for nystatin   given for prn use

## 2024-09-13 DIAGNOSIS — M5451 Vertebrogenic low back pain: Secondary | ICD-10-CM | POA: Diagnosis not present

## 2024-09-13 DIAGNOSIS — M7918 Myalgia, other site: Secondary | ICD-10-CM | POA: Diagnosis not present

## 2024-09-13 DIAGNOSIS — M9902 Segmental and somatic dysfunction of thoracic region: Secondary | ICD-10-CM | POA: Diagnosis not present

## 2024-09-13 DIAGNOSIS — M546 Pain in thoracic spine: Secondary | ICD-10-CM | POA: Diagnosis not present

## 2024-09-13 DIAGNOSIS — M9904 Segmental and somatic dysfunction of sacral region: Secondary | ICD-10-CM | POA: Diagnosis not present

## 2024-09-13 DIAGNOSIS — M9903 Segmental and somatic dysfunction of lumbar region: Secondary | ICD-10-CM | POA: Diagnosis not present

## 2024-09-29 DIAGNOSIS — F0280 Dementia in other diseases classified elsewhere without behavioral disturbance: Secondary | ICD-10-CM | POA: Diagnosis not present

## 2024-09-29 DIAGNOSIS — F0150 Vascular dementia without behavioral disturbance: Secondary | ICD-10-CM | POA: Diagnosis not present

## 2024-09-29 DIAGNOSIS — Z1331 Encounter for screening for depression: Secondary | ICD-10-CM | POA: Diagnosis not present

## 2024-09-29 DIAGNOSIS — G309 Alzheimer's disease, unspecified: Secondary | ICD-10-CM | POA: Diagnosis not present

## 2024-11-09 ENCOUNTER — Other Ambulatory Visit: Payer: Self-pay | Admitting: Internal Medicine
